# Patient Record
Sex: Male | Born: 1950 | Race: White | Hispanic: No | Marital: Married | Smoking: Never smoker
Health system: Southern US, Community
[De-identification: ages and names within clinical notes are randomized; demographics above are authoritative.]

## PROBLEM LIST (undated history)

## (undated) DIAGNOSIS — Z87442 Personal history of urinary calculi: Secondary | ICD-10-CM

## (undated) DIAGNOSIS — I420 Dilated cardiomyopathy: Secondary | ICD-10-CM

## (undated) DIAGNOSIS — K5730 Diverticulosis of large intestine without perforation or abscess without bleeding: Secondary | ICD-10-CM

## (undated) DIAGNOSIS — I10 Essential (primary) hypertension: Secondary | ICD-10-CM

## (undated) DIAGNOSIS — Z860101 Personal history of adenomatous and serrated colon polyps: Secondary | ICD-10-CM

## (undated) DIAGNOSIS — Z8659 Personal history of other mental and behavioral disorders: Secondary | ICD-10-CM

## (undated) DIAGNOSIS — M5030 Other cervical disc degeneration, unspecified cervical region: Secondary | ICD-10-CM

## (undated) DIAGNOSIS — G5602 Carpal tunnel syndrome, left upper limb: Secondary | ICD-10-CM

## (undated) DIAGNOSIS — D494 Neoplasm of unspecified behavior of bladder: Secondary | ICD-10-CM

## (undated) DIAGNOSIS — E785 Hyperlipidemia, unspecified: Secondary | ICD-10-CM

## (undated) DIAGNOSIS — R159 Full incontinence of feces: Secondary | ICD-10-CM

## (undated) DIAGNOSIS — J189 Pneumonia, unspecified organism: Secondary | ICD-10-CM

## (undated) DIAGNOSIS — K859 Acute pancreatitis without necrosis or infection, unspecified: Secondary | ICD-10-CM

## (undated) DIAGNOSIS — F419 Anxiety disorder, unspecified: Secondary | ICD-10-CM

## (undated) DIAGNOSIS — G4733 Obstructive sleep apnea (adult) (pediatric): Secondary | ICD-10-CM

## (undated) DIAGNOSIS — Z8551 Personal history of malignant neoplasm of bladder: Secondary | ICD-10-CM

## (undated) DIAGNOSIS — Z86010 Personal history of colonic polyps: Secondary | ICD-10-CM

## (undated) DIAGNOSIS — I428 Other cardiomyopathies: Secondary | ICD-10-CM

## (undated) DIAGNOSIS — I272 Pulmonary hypertension, unspecified: Secondary | ICD-10-CM

## (undated) DIAGNOSIS — I2510 Atherosclerotic heart disease of native coronary artery without angina pectoris: Secondary | ICD-10-CM

## (undated) DIAGNOSIS — E291 Testicular hypofunction: Secondary | ICD-10-CM

## (undated) DIAGNOSIS — J302 Other seasonal allergic rhinitis: Secondary | ICD-10-CM

## (undated) DIAGNOSIS — K219 Gastro-esophageal reflux disease without esophagitis: Secondary | ICD-10-CM

## (undated) DIAGNOSIS — K649 Unspecified hemorrhoids: Secondary | ICD-10-CM

## (undated) DIAGNOSIS — I517 Cardiomegaly: Secondary | ICD-10-CM

## (undated) DIAGNOSIS — Z9989 Dependence on other enabling machines and devices: Secondary | ICD-10-CM

## (undated) DIAGNOSIS — Z87448 Personal history of other diseases of urinary system: Secondary | ICD-10-CM

## (undated) DIAGNOSIS — E119 Type 2 diabetes mellitus without complications: Secondary | ICD-10-CM

## (undated) DIAGNOSIS — K449 Diaphragmatic hernia without obstruction or gangrene: Secondary | ICD-10-CM

## (undated) DIAGNOSIS — I519 Heart disease, unspecified: Secondary | ICD-10-CM

## (undated) HISTORY — PX: TRANSTHORACIC ECHOCARDIOGRAM: SHX275

## (undated) HISTORY — DX: Gastro-esophageal reflux disease without esophagitis: K21.9

## (undated) HISTORY — PX: COLONOSCOPY: SHX174

## (undated) HISTORY — PX: CARDIOVASCULAR STRESS TEST: SHX262

## (undated) HISTORY — DX: Hyperlipidemia, unspecified: E78.5

## (undated) HISTORY — DX: Acute pancreatitis without necrosis or infection, unspecified: K85.90

## (undated) HISTORY — DX: Other cervical disc degeneration, unspecified cervical region: M50.30

## (undated) HISTORY — DX: Type 2 diabetes mellitus without complications: E11.9

## (undated) HISTORY — PX: TONSILLECTOMY: SUR1361

## (undated) HISTORY — DX: Pneumonia, unspecified organism: J18.9

## (undated) HISTORY — DX: Anxiety disorder, unspecified: F41.9

## (undated) HISTORY — PX: VASECTOMY: SHX75

## (undated) HISTORY — DX: Unspecified hemorrhoids: K64.9

---

## 1984-11-22 HISTORY — PX: ORBITAL FRACTURE SURGERY: SHX725

## 1998-04-23 ENCOUNTER — Ambulatory Visit (HOSPITAL_COMMUNITY): Admission: RE | Admit: 1998-04-23 | Discharge: 1998-04-23 | Payer: Self-pay | Admitting: Internal Medicine

## 1998-04-23 ENCOUNTER — Encounter: Payer: Self-pay | Admitting: Internal Medicine

## 2000-10-26 ENCOUNTER — Other Ambulatory Visit: Admission: RE | Admit: 2000-10-26 | Discharge: 2000-10-26 | Payer: Self-pay | Admitting: Internal Medicine

## 2000-10-26 ENCOUNTER — Encounter (INDEPENDENT_AMBULATORY_CARE_PROVIDER_SITE_OTHER): Payer: Self-pay | Admitting: Specialist

## 2000-10-26 ENCOUNTER — Encounter: Payer: Self-pay | Admitting: Internal Medicine

## 2000-11-22 HISTORY — PX: VARICOSE VEIN SURGERY: SHX832

## 2004-08-06 ENCOUNTER — Encounter: Payer: Self-pay | Admitting: Internal Medicine

## 2004-11-18 ENCOUNTER — Ambulatory Visit: Payer: Self-pay | Admitting: Internal Medicine

## 2004-12-15 ENCOUNTER — Ambulatory Visit: Payer: Self-pay | Admitting: Internal Medicine

## 2005-02-15 ENCOUNTER — Ambulatory Visit: Payer: Self-pay | Admitting: Internal Medicine

## 2005-05-06 ENCOUNTER — Ambulatory Visit: Payer: Self-pay | Admitting: Internal Medicine

## 2005-10-13 ENCOUNTER — Ambulatory Visit: Payer: Self-pay | Admitting: Internal Medicine

## 2005-10-26 ENCOUNTER — Ambulatory Visit: Payer: Self-pay | Admitting: Internal Medicine

## 2005-11-10 ENCOUNTER — Ambulatory Visit: Payer: Self-pay | Admitting: Internal Medicine

## 2005-11-14 ENCOUNTER — Emergency Department (HOSPITAL_COMMUNITY): Admission: EM | Admit: 2005-11-14 | Discharge: 2005-11-14 | Payer: Self-pay | Admitting: Emergency Medicine

## 2005-11-26 ENCOUNTER — Encounter (INDEPENDENT_AMBULATORY_CARE_PROVIDER_SITE_OTHER): Payer: Self-pay | Admitting: *Deleted

## 2005-11-26 ENCOUNTER — Ambulatory Visit: Payer: Self-pay | Admitting: Internal Medicine

## 2005-11-26 ENCOUNTER — Encounter (INDEPENDENT_AMBULATORY_CARE_PROVIDER_SITE_OTHER): Payer: Self-pay | Admitting: Specialist

## 2005-12-01 ENCOUNTER — Ambulatory Visit: Payer: Self-pay | Admitting: Internal Medicine

## 2005-12-06 ENCOUNTER — Ambulatory Visit: Payer: Self-pay | Admitting: Internal Medicine

## 2006-03-07 ENCOUNTER — Ambulatory Visit: Payer: Self-pay | Admitting: Internal Medicine

## 2006-04-08 ENCOUNTER — Ambulatory Visit: Payer: Self-pay | Admitting: Internal Medicine

## 2006-05-10 ENCOUNTER — Ambulatory Visit: Payer: Self-pay | Admitting: Internal Medicine

## 2006-06-28 ENCOUNTER — Ambulatory Visit: Payer: Self-pay | Admitting: Internal Medicine

## 2006-07-18 ENCOUNTER — Ambulatory Visit: Payer: Self-pay | Admitting: Internal Medicine

## 2006-07-21 ENCOUNTER — Ambulatory Visit: Payer: Self-pay | Admitting: Internal Medicine

## 2006-10-09 ENCOUNTER — Encounter (INDEPENDENT_AMBULATORY_CARE_PROVIDER_SITE_OTHER): Payer: Self-pay | Admitting: *Deleted

## 2006-10-09 ENCOUNTER — Emergency Department (HOSPITAL_COMMUNITY): Admission: EM | Admit: 2006-10-09 | Discharge: 2006-10-09 | Payer: Self-pay | Admitting: Emergency Medicine

## 2006-10-10 ENCOUNTER — Ambulatory Visit: Payer: Self-pay | Admitting: Internal Medicine

## 2006-10-14 ENCOUNTER — Ambulatory Visit: Payer: Self-pay | Admitting: Internal Medicine

## 2006-10-14 ENCOUNTER — Encounter (INDEPENDENT_AMBULATORY_CARE_PROVIDER_SITE_OTHER): Payer: Self-pay | Admitting: *Deleted

## 2006-10-14 ENCOUNTER — Encounter (INDEPENDENT_AMBULATORY_CARE_PROVIDER_SITE_OTHER): Payer: Self-pay | Admitting: Specialist

## 2006-10-14 ENCOUNTER — Inpatient Hospital Stay (HOSPITAL_COMMUNITY): Admission: AD | Admit: 2006-10-14 | Discharge: 2006-10-16 | Payer: Self-pay | Admitting: Internal Medicine

## 2006-10-15 ENCOUNTER — Ambulatory Visit: Payer: Self-pay | Admitting: Internal Medicine

## 2006-10-15 ENCOUNTER — Encounter (INDEPENDENT_AMBULATORY_CARE_PROVIDER_SITE_OTHER): Payer: Self-pay | Admitting: *Deleted

## 2006-10-15 HISTORY — PX: LAPAROSCOPIC CHOLECYSTECTOMY: SUR755

## 2006-10-16 ENCOUNTER — Encounter (INDEPENDENT_AMBULATORY_CARE_PROVIDER_SITE_OTHER): Payer: Self-pay | Admitting: *Deleted

## 2006-10-17 ENCOUNTER — Encounter (INDEPENDENT_AMBULATORY_CARE_PROVIDER_SITE_OTHER): Payer: Self-pay | Admitting: *Deleted

## 2006-11-25 ENCOUNTER — Ambulatory Visit (HOSPITAL_BASED_OUTPATIENT_CLINIC_OR_DEPARTMENT_OTHER): Admission: RE | Admit: 2006-11-25 | Discharge: 2006-11-25 | Payer: Self-pay | Admitting: Urology

## 2006-11-25 HISTORY — PX: OTHER SURGICAL HISTORY: SHX169

## 2006-12-19 DIAGNOSIS — R7989 Other specified abnormal findings of blood chemistry: Secondary | ICD-10-CM | POA: Insufficient documentation

## 2007-04-28 ENCOUNTER — Ambulatory Visit: Payer: Self-pay | Admitting: Internal Medicine

## 2007-05-22 ENCOUNTER — Encounter: Payer: Self-pay | Admitting: Internal Medicine

## 2007-05-30 ENCOUNTER — Ambulatory Visit: Payer: Self-pay | Admitting: Internal Medicine

## 2007-05-30 DIAGNOSIS — K219 Gastro-esophageal reflux disease without esophagitis: Secondary | ICD-10-CM

## 2007-05-30 DIAGNOSIS — I10 Essential (primary) hypertension: Secondary | ICD-10-CM

## 2007-06-05 ENCOUNTER — Encounter (INDEPENDENT_AMBULATORY_CARE_PROVIDER_SITE_OTHER): Payer: Self-pay | Admitting: *Deleted

## 2007-06-16 ENCOUNTER — Telehealth (INDEPENDENT_AMBULATORY_CARE_PROVIDER_SITE_OTHER): Payer: Self-pay | Admitting: *Deleted

## 2007-06-19 ENCOUNTER — Encounter: Payer: Self-pay | Admitting: Internal Medicine

## 2007-08-06 ENCOUNTER — Encounter: Payer: Self-pay | Admitting: Internal Medicine

## 2007-08-19 ENCOUNTER — Encounter: Payer: Self-pay | Admitting: Internal Medicine

## 2007-08-25 ENCOUNTER — Ambulatory Visit: Payer: Self-pay | Admitting: Internal Medicine

## 2007-08-29 ENCOUNTER — Encounter: Payer: Self-pay | Admitting: Internal Medicine

## 2007-09-01 ENCOUNTER — Ambulatory Visit: Payer: Self-pay | Admitting: Internal Medicine

## 2007-09-01 LAB — CONVERTED CEMR LAB
BUN: 14 mg/dL (ref 6–23)
Creatinine, Ser: 1.2 mg/dL (ref 0.4–1.5)

## 2007-09-04 ENCOUNTER — Encounter (INDEPENDENT_AMBULATORY_CARE_PROVIDER_SITE_OTHER): Payer: Self-pay | Admitting: *Deleted

## 2007-09-13 ENCOUNTER — Encounter: Payer: Self-pay | Admitting: Internal Medicine

## 2007-10-10 ENCOUNTER — Ambulatory Visit: Payer: Self-pay | Admitting: Internal Medicine

## 2007-10-14 LAB — CONVERTED CEMR LAB
AST: 26 units/L (ref 0–37)
Bilirubin, Direct: 0.2 mg/dL (ref 0.0–0.3)
Cholesterol: 153 mg/dL (ref 0–200)
Direct LDL: 87.4 mg/dL
HDL: 42.5 mg/dL (ref >39.0)
Total Bilirubin: 1.3 mg/dL — ABNORMAL HIGH (ref 0.3–1.2)
Total Protein: 6.6 g/dL (ref 6.0–8.3)
Triglycerides: 211 mg/dL (ref 0–149)

## 2007-10-16 ENCOUNTER — Encounter (INDEPENDENT_AMBULATORY_CARE_PROVIDER_SITE_OTHER): Payer: Self-pay | Admitting: *Deleted

## 2007-10-17 ENCOUNTER — Encounter (INDEPENDENT_AMBULATORY_CARE_PROVIDER_SITE_OTHER): Payer: Self-pay | Admitting: *Deleted

## 2007-10-20 ENCOUNTER — Ambulatory Visit: Payer: Self-pay | Admitting: Internal Medicine

## 2007-10-20 DIAGNOSIS — E782 Mixed hyperlipidemia: Secondary | ICD-10-CM

## 2007-10-20 LAB — CONVERTED CEMR LAB: LDL Goal: 130 mg/dL

## 2007-12-18 ENCOUNTER — Telehealth (INDEPENDENT_AMBULATORY_CARE_PROVIDER_SITE_OTHER): Payer: Self-pay | Admitting: *Deleted

## 2007-12-18 ENCOUNTER — Ambulatory Visit: Payer: Self-pay | Admitting: Internal Medicine

## 2007-12-18 DIAGNOSIS — K449 Diaphragmatic hernia without obstruction or gangrene: Secondary | ICD-10-CM | POA: Insufficient documentation

## 2007-12-19 ENCOUNTER — Ambulatory Visit: Payer: Self-pay | Admitting: Internal Medicine

## 2007-12-20 ENCOUNTER — Ambulatory Visit: Payer: Self-pay | Admitting: Internal Medicine

## 2007-12-20 ENCOUNTER — Telehealth (INDEPENDENT_AMBULATORY_CARE_PROVIDER_SITE_OTHER): Payer: Self-pay | Admitting: *Deleted

## 2007-12-20 LAB — CONVERTED CEMR LAB
ALT: 90 units/L — ABNORMAL HIGH (ref 0–53)
AST: 286 units/L — ABNORMAL HIGH (ref 0–37)
AST: 217 units/L — ABNORMAL HIGH (ref 0–37)
Albumin: 3.9 g/dL (ref 3.5–5.2)
Alkaline Phosphatase: 32 units/L — ABNORMAL LOW (ref 39–117)
Basophils Relative: 0.4 % (ref 0.0–1.0)
Bilirubin, Direct: 0.1 mg/dL (ref 0.0–0.3)
Direct LDL: 125.3 mg/dL
Eosinophils Relative: 1.8 % (ref 0.0–5.0)
Hemoglobin: 14.8 g/dL (ref 13.0–17.0)
Lymphocytes Relative: 21.3 % (ref 12.0–46.0)
Monocytes Absolute: 0.7 10*3/uL (ref 0.2–0.7)
Neutro Abs: 4 10*3/uL (ref 1.4–7.7)
RDW: 11.3 % — ABNORMAL LOW (ref 11.5–14.6)
Total Bilirubin: 1.1 mg/dL (ref 0.3–1.2)
Total CHOL/HDL Ratio: 5.2
Total CK: 7859 units/L (ref 7–195)
Total CK: 3902 units/L — ABNORMAL HIGH (ref 7–232)
Total Protein: 6.2 g/dL (ref 6.0–8.3)
Total Protein: 6.7 g/dL (ref 6.0–8.3)
VLDL: 45 mg/dL — ABNORMAL HIGH (ref 0–40)
WBC: 6.1 10*3/uL (ref 4.5–10.5)

## 2007-12-21 ENCOUNTER — Encounter (INDEPENDENT_AMBULATORY_CARE_PROVIDER_SITE_OTHER): Payer: Self-pay | Admitting: *Deleted

## 2007-12-27 ENCOUNTER — Ambulatory Visit: Payer: Self-pay

## 2007-12-27 ENCOUNTER — Ambulatory Visit: Payer: Self-pay | Admitting: Internal Medicine

## 2008-01-03 ENCOUNTER — Encounter: Payer: Self-pay | Admitting: Internal Medicine

## 2008-01-19 ENCOUNTER — Encounter: Payer: Self-pay | Admitting: Internal Medicine

## 2008-04-06 DIAGNOSIS — K5730 Diverticulosis of large intestine without perforation or abscess without bleeding: Secondary | ICD-10-CM | POA: Insufficient documentation

## 2008-04-06 DIAGNOSIS — D126 Benign neoplasm of colon, unspecified: Secondary | ICD-10-CM

## 2008-09-05 ENCOUNTER — Encounter: Payer: Self-pay | Admitting: Internal Medicine

## 2008-09-12 ENCOUNTER — Encounter: Payer: Self-pay | Admitting: Internal Medicine

## 2008-09-18 ENCOUNTER — Encounter: Payer: Self-pay | Admitting: Internal Medicine

## 2008-12-02 ENCOUNTER — Telehealth (INDEPENDENT_AMBULATORY_CARE_PROVIDER_SITE_OTHER): Payer: Self-pay | Admitting: *Deleted

## 2008-12-05 ENCOUNTER — Encounter: Payer: Self-pay | Admitting: Internal Medicine

## 2009-01-01 ENCOUNTER — Ambulatory Visit: Payer: Self-pay | Admitting: Internal Medicine

## 2009-01-09 ENCOUNTER — Ambulatory Visit: Payer: Self-pay | Admitting: Internal Medicine

## 2009-04-22 ENCOUNTER — Ambulatory Visit: Payer: Self-pay | Admitting: Internal Medicine

## 2009-04-22 DIAGNOSIS — R109 Unspecified abdominal pain: Secondary | ICD-10-CM | POA: Insufficient documentation

## 2009-04-22 LAB — CONVERTED CEMR LAB
Basophils Relative: 0.4 % (ref 0.0–3.0)
Bilirubin Urine: NEGATIVE
Eosinophils Relative: 0.9 % (ref 0.0–5.0)
HCT: 44.9 % (ref 39.0–52.0)
Lymphs Abs: 1.9 10*3/uL (ref 0.7–4.0)
MCHC: 34 g/dL (ref 30.0–36.0)
MCV: 95.3 fL (ref 78.0–100.0)
Monocytes Absolute: 0.6 10*3/uL (ref 0.1–1.0)
Nitrite: NEGATIVE
Platelets: 220 10*3/uL (ref 150.0–400.0)
Protein, U semiquant: NEGATIVE
RBC: 4.71 M/uL (ref 4.22–5.81)
Urobilinogen, UA: 0.2
WBC: 6.8 10*3/uL (ref 4.5–10.5)

## 2009-04-23 ENCOUNTER — Encounter (INDEPENDENT_AMBULATORY_CARE_PROVIDER_SITE_OTHER): Payer: Self-pay | Admitting: *Deleted

## 2009-06-25 ENCOUNTER — Ambulatory Visit: Payer: Self-pay | Admitting: Internal Medicine

## 2009-06-25 DIAGNOSIS — R1032 Left lower quadrant pain: Secondary | ICD-10-CM

## 2009-09-24 ENCOUNTER — Telehealth (INDEPENDENT_AMBULATORY_CARE_PROVIDER_SITE_OTHER): Payer: Self-pay | Admitting: *Deleted

## 2009-09-26 ENCOUNTER — Encounter: Payer: Self-pay | Admitting: Internal Medicine

## 2009-10-08 ENCOUNTER — Ambulatory Visit: Payer: Self-pay | Admitting: Internal Medicine

## 2009-10-08 DIAGNOSIS — M79609 Pain in unspecified limb: Secondary | ICD-10-CM | POA: Insufficient documentation

## 2009-10-08 DIAGNOSIS — R209 Unspecified disturbances of skin sensation: Secondary | ICD-10-CM

## 2009-10-08 DIAGNOSIS — R9431 Abnormal electrocardiogram [ECG] [EKG]: Secondary | ICD-10-CM | POA: Insufficient documentation

## 2009-10-27 ENCOUNTER — Encounter: Payer: Self-pay | Admitting: Internal Medicine

## 2010-01-29 ENCOUNTER — Encounter: Payer: Self-pay | Admitting: Internal Medicine

## 2010-01-30 ENCOUNTER — Ambulatory Visit: Payer: Self-pay | Admitting: Internal Medicine

## 2010-02-03 LAB — CONVERTED CEMR LAB
ALT: 36 units/L (ref 0–53)
AST: 35 units/L (ref 0–37)
Alkaline Phosphatase: 47 units/L (ref 39–117)
BUN: 16 mg/dL (ref 6–23)
Bilirubin, Direct: 0.2 mg/dL (ref 0.0–0.3)
Calcium: 8.9 mg/dL (ref 8.4–10.5)
GFR calc non Af Amer: 72.83 mL/min (ref >60)
Glucose, Bld: 112 mg/dL — ABNORMAL HIGH (ref 70–99)
Total Bilirubin: 0.8 mg/dL (ref 0.3–1.2)

## 2010-03-12 ENCOUNTER — Ambulatory Visit: Payer: Self-pay | Admitting: Internal Medicine

## 2010-03-12 DIAGNOSIS — G5600 Carpal tunnel syndrome, unspecified upper limb: Secondary | ICD-10-CM

## 2010-03-12 DIAGNOSIS — M5412 Radiculopathy, cervical region: Secondary | ICD-10-CM | POA: Insufficient documentation

## 2010-04-06 ENCOUNTER — Encounter: Payer: Self-pay | Admitting: Internal Medicine

## 2010-07-10 ENCOUNTER — Encounter: Payer: Self-pay | Admitting: Internal Medicine

## 2010-07-20 ENCOUNTER — Telehealth (INDEPENDENT_AMBULATORY_CARE_PROVIDER_SITE_OTHER): Payer: Self-pay | Admitting: *Deleted

## 2010-10-21 ENCOUNTER — Encounter: Payer: Self-pay | Admitting: Internal Medicine

## 2010-11-20 ENCOUNTER — Encounter: Payer: Self-pay | Admitting: Internal Medicine

## 2010-11-22 DIAGNOSIS — J189 Pneumonia, unspecified organism: Secondary | ICD-10-CM

## 2010-11-22 HISTORY — DX: Pneumonia, unspecified organism: J18.9

## 2010-11-25 ENCOUNTER — Encounter (INDEPENDENT_AMBULATORY_CARE_PROVIDER_SITE_OTHER): Payer: Self-pay | Admitting: *Deleted

## 2010-12-02 ENCOUNTER — Telehealth (INDEPENDENT_AMBULATORY_CARE_PROVIDER_SITE_OTHER): Payer: Self-pay | Admitting: *Deleted

## 2010-12-03 ENCOUNTER — Ambulatory Visit
Admission: RE | Admit: 2010-12-03 | Discharge: 2010-12-03 | Payer: Self-pay | Source: Home / Self Care | Attending: Internal Medicine | Admitting: Internal Medicine

## 2010-12-04 ENCOUNTER — Encounter: Payer: Self-pay | Admitting: Internal Medicine

## 2010-12-04 ENCOUNTER — Ambulatory Visit
Admission: RE | Admit: 2010-12-04 | Discharge: 2010-12-04 | Payer: Self-pay | Source: Home / Self Care | Attending: Internal Medicine | Admitting: Internal Medicine

## 2010-12-04 ENCOUNTER — Telehealth (INDEPENDENT_AMBULATORY_CARE_PROVIDER_SITE_OTHER): Payer: Self-pay | Admitting: *Deleted

## 2010-12-11 ENCOUNTER — Telehealth: Payer: Self-pay | Admitting: Internal Medicine

## 2010-12-11 ENCOUNTER — Ambulatory Visit
Admission: RE | Admit: 2010-12-11 | Discharge: 2010-12-11 | Payer: Self-pay | Source: Home / Self Care | Attending: Internal Medicine | Admitting: Internal Medicine

## 2010-12-11 ENCOUNTER — Encounter: Payer: Self-pay | Admitting: Internal Medicine

## 2010-12-13 ENCOUNTER — Encounter: Payer: Self-pay | Admitting: Internal Medicine

## 2010-12-18 ENCOUNTER — Ambulatory Visit
Admission: RE | Admit: 2010-12-18 | Discharge: 2010-12-18 | Payer: Self-pay | Source: Home / Self Care | Attending: Internal Medicine | Admitting: Internal Medicine

## 2010-12-18 ENCOUNTER — Telehealth (INDEPENDENT_AMBULATORY_CARE_PROVIDER_SITE_OTHER): Payer: Self-pay | Admitting: *Deleted

## 2010-12-20 LAB — CONVERTED CEMR LAB
ALT: 31 units/L (ref 0–53)
ALT: 40 units/L (ref 0–53)
AST: 29 units/L (ref 0–37)
AST: 34 units/L (ref 0–37)
Alkaline Phosphatase: 49 units/L (ref 39–117)
BUN: 16 mg/dL (ref 6–23)
BUN: 17 mg/dL (ref 6–23)
Basophils Relative: 0.2 % (ref 0.0–3.0)
Bilirubin, Direct: 0.1 mg/dL (ref 0.0–0.3)
CO2: 30 meq/L (ref 19–32)
Calcium: 9.2 mg/dL (ref 8.4–10.5)
Calcium: 9.2 mg/dL (ref 8.4–10.5)
Chloride: 101 meq/L (ref 96–112)
Chloride: 106 meq/L (ref 96–112)
Creatinine, Ser: 1 mg/dL (ref 0.4–1.5)
Eosinophils Relative: 1.1 % (ref 0.0–5.0)
GFR calc non Af Amer: 67 mL/min
Glucose, Bld: 106 mg/dL — ABNORMAL HIGH (ref 70–99)
Glucose, Bld: 108 mg/dL — ABNORMAL HIGH (ref 70–99)
HDL goal, serum: 40 mg/dL
HDL: 32.3 mg/dL — ABNORMAL LOW (ref >39.0)
Hemoglobin: 15.2 g/dL (ref 13.0–17.0)
Hgb A1c MFr Bld: 5.6 % (ref 4.6–6.0)
LDL Goal: 160 mg/dL
Lymphocytes Relative: 24.9 % (ref 12.0–46.0)
Monocytes Relative: 9.6 % (ref 3.0–12.0)
Neutro Abs: 4 10*3/uL (ref 1.4–7.7)
Neutrophils Relative %: 64.2 % (ref 43.0–77.0)
RBC: 4.6 M/uL (ref 4.22–5.81)
Total Bilirubin: 1.3 mg/dL — ABNORMAL HIGH (ref 0.3–1.2)
Total CHOL/HDL Ratio: 4.4
Total CK: 185 units/L (ref 7–195)
Total Protein: 6.9 g/dL (ref 6.0–8.3)
VLDL: 105 mg/dL — ABNORMAL HIGH (ref 0–40)
WBC: 6.3 10*3/uL (ref 4.5–10.5)

## 2010-12-22 NOTE — Miscellaneous (Signed)
Summary: Orders Update   Clinical Lists Changes  Orders: Added new Test order of T-Cervical Spine Comp w/Flex & Ext (16109UE) - Signed

## 2010-12-22 NOTE — Progress Notes (Signed)
Summary: REFILL REQUEST  Phone Note Refill Request Call back at [phone] Message from:  Pharmacy on July 20, 2010 3:21 PM  Refills Requested: Medication #1:  LIPITOR 20 MG  TABS Take one tablet daily   Dosage confirmed as above?Dosage Confirmed   Supply Requested: 1 month   Last Refilled: 06/18/2010 [facility]  Next Appointment Scheduled: NONE Initial call taken by: Lavell Islam,  July 20, 2010 3:22 PM    Prescriptions: LIPITOR 20 MG  TABS (ATORVASTATIN CALCIUM) Take one tablet daily  #30 x 6   Entered by:   Shonna Chock CMA   Authorized by:   [name] MD   Signed by:   Shonna Chock CMA on 07/21/2010   Method used:   Electronically to        [facility]* (retail)       [address]       [place], [place]  [id number]       Ph: [phone] or [phone]       Fax: [phone]   RxID:   [id number]

## 2010-12-22 NOTE — Assessment & Plan Note (Signed)
Summary: hand pain/kdc   Vital Signs:  Patient profile:   60 year old male Weight:      270.6 pounds Temp:     98.6 degrees F oral Pulse rate:   80 / minute Resp:     15 per minute BP sitting:   110 / 70  (left arm) Cuff size:   large  Vitals Entered By: Shonna Chock (March 12, 2010 10:31 AM) CC: Hand pain Comments REVIEWED MED LIST, PATIENT AGREED DOSE AND INSTRUCTION CORRECT    Primary Care Provider:  Chrissie Noa Emmerie Battaglia,MD  CC:  Hand pain.  History of Present Illness: Intermittent dull- throbbimg pain in L 3rd & 4th fingers ,up to 6-7 level w/o trigger or injury, radiating to palm & lasting 30-60 min.It is recurring every 5 min - 60 min, worse @ night.No numbness & tingling; NSAIDS w/o benefit.Separate intermittent tingling in  4-6 inch area @ L posterior shoulder which may or may not be present with finger symptoms..  Allergies (verified): No Known Drug Allergies  Review of Systems General:  Denies chills, fever, and weight loss. CV:  Complains of swelling of feet and swelling of hands; denies chest pain or discomfort and palpitations; Some edema X 1 week. Resp:  Denies cough and shortness of breath. MS:  Denies joint pain. Derm:  Denies hair loss. Neuro:  Denies headaches, tremors, and weakness. Endo:  Complains of weight change; denies cold intolerance and heat intolerance; 20# gain over past year with Testosterone injections.  Physical Exam  General:  in no acute distress; alert,appropriate and cooperative throughout examination Eyes:  No corneal or conjunctival inflammation noted. EONo lid lag Neck:  No deformities, masses, or tenderness noted. Extremities:  No clubbing, cyanosis, edema. + Tinel's LUE Neurologic:  alert & oriented X3, strength normal in all extremities, and DTRs symmetrical and normal.   Skin:  Intact without suspicious lesions or rashes Cervical Nodes:  No lymphadenopathy noted Psych:  memory intact for recent and remote, normally interactive, and  good eye contact.     Impression & Recommendations:  Problem # 1:  CARPAL TUNNEL SYNDROME, LEFT (ICD-354.0)  Clinically present with + Tinel's  Orders: Venipuncture ([address]) Misc. Referral (Misc. Ref) TLB-TSH (Thyroid Stimulating Hormone) (84443-TSH)  Problem # 2:  CERVICAL RADICULOPATHY (ICD-723.4)  ? C8  with  weakness   Orders: Misc. Referral (Misc. Ref)  Complete Medication List: 1)  Lexapro 10 Mg Tabs (Escitalopram oxalate) .Marland Kitchen.. 1 by mouth qd 2)  Nexium 40 Mg Cpdr (Esomeprazole magnesium) .Marland Kitchen.. 1 by mouth qd 3)  Baby Asa 81mg   .... 1 by mouth once daily 4)  Protegra Multivitiam  ..Marland Kitchen. 1 by mouth once daily 5)  Lipitor 20 Mg Tabs (Atorvastatin calcium) .... Take one tablet daily 6)  Lisinopril 20 Mg Tabs (Lisinopril) .Marland Kitchen.. 1 by mouth once daily 7)  Zyrtec Hives Relief 10 Mg Tabs (Cetirizine hcl) .Marland Kitchen.. 1 by mouth once daily 8)  Testosterone Injection  .... 300mg  every two weeks -per urologist  Patient Instructions: 1)  Monitor for ergonomic( Ex . computer use) or repetitive hand motion triggers for symptoms. NCT /EMG will be scheduled as discussed.

## 2010-12-22 NOTE — Letter (Signed)
Summary: E Mail from Patient Regarding Med  E Mail from Patient Regarding Med   Imported By: Lanelle Bal 07/16/2010 12:25:59  _____________________________________________________________________  External Attachment:    Type:   Image     Comment:   External Document

## 2010-12-22 NOTE — Medication Information (Signed)
Summary: Prior [employer] Approved for Nexium/[employer]  Prior [employer] Approved for Nexium/[employer]   Imported By: Sherian Rein 10/26/2010 14:23:49  _____________________________________________________________________  External Attachment:    Type:   Image     Comment:   External Document

## 2010-12-23 ENCOUNTER — Encounter (INDEPENDENT_AMBULATORY_CARE_PROVIDER_SITE_OTHER): Payer: Self-pay | Admitting: *Deleted

## 2010-12-24 ENCOUNTER — Ambulatory Visit: Admit: 2010-12-24 | Payer: Self-pay | Admitting: Internal Medicine

## 2010-12-24 ENCOUNTER — Encounter: Payer: Self-pay | Admitting: Internal Medicine

## 2010-12-24 NOTE — Progress Notes (Signed)
Summary: Email input--Decongestant/sinus  Phone Note Other Incoming   Summary of Call: Patient and MD have been communicating via emial. Patient asked if there is remedy to open sinus/decongestant. Patient was advised via email to use neti-pot two times a day followed by generic Fluticason which will be sent to [facility]. Please do not take decongestants.  Initial call taken by: Lucious Groves CMA,  December 11, 2010 4:10 PM    New/Updated Medications: FLUTICASONE PROPIONATE 50 MCG/ACT SUSP (FLUTICASONE PROPIONATE) 1 spray each nostril two times a day Prescriptions: FLUTICASONE PROPIONATE 50 MCG/ACT SUSP (FLUTICASONE PROPIONATE) 1 spray each nostril two times a day  #1 x 3   Entered by:   Lucious Groves CMA   Authorized by:   [name] MD   Signed by:   Lucious Groves CMA on 12/11/2010   Method used:   Faxed to ...       [facility] (retail)       [address]       [place], [place]  [address]       Ph: [phone] or [phone]       Fax: [phone]   RxID:   [id number]

## 2010-12-24 NOTE — Progress Notes (Signed)
Summary: patient needs appointment  Phone Note Call from Patient   Caller: Patient Summary of Call: Per Dr Alwyn Ren, patient needs to be seen for a SDA or NDA visit for cold and cough that wont go away---[facility] on [phone] (W)  and [phone] to call us and schedule a 20 minute visit with Dr Alwyn Ren Initial call taken by: Jerolyn Shin,  December 02, 2010 1:54 PM  Follow-up for Phone Call        patient called back and scheduled appointment for Thursday 12/03/2010 at 3:30PM Follow-up by: Jerolyn Shin,  December 02, 2010 3:26 PM

## 2010-12-24 NOTE — Assessment & Plan Note (Signed)
Summary: cold, runny nose, pressure in ears, moving into bronchial tub...   Vital Signs:  Patient profile:   60 year old male Weight:      271.6 pounds BMI:     34.07 O2 Sat:      93 % on Room air Temp:     98.5 degrees F oral Pulse rate:   82 / minute Resp:     14 per minute BP sitting:   120 / 72  (left arm) Cuff size:   large  Vitals Entered By: Shonna Chock CMA (December 03, 2010 3:43 PM)  O2 Flow:  Room air CC: Cold, runny nose & pressure in ears. Symptoms onset 2 weeks before Christmas, patient rx'ed two antibiotics since and still with symptoms. Patient ?'s Bronchitis, URI symptoms   Primary Care Provider:  Chrissie Noa Gissel Keilman,MD  CC:  Cold, runny nose & pressure in ears. Symptoms onset 2 weeks before Christmas, patient rx'ed two antibiotics since and still with symptoms. Patient ?'s Bronchitis, and URI symptoms.  History of Present Illness:    Onset as head cold in mid December; no improvement with 7 days of Amox . Emycin Rxed 01/08 @ [facility]. Head congestion has resolved , but chest symptoms persist.  The patient now  reports variable  dry  to  productive cough, but denies nasal congestion, purulent nasal discharge, and earache.  Associated symptoms include wheezing.  The patient denies fever and dyspnea.  The patient denies headache.  The patient denies the following risk factors for Strep sinusitis: unilateral facial pain, tooth pain, and tender adenopathy.  No PMH of asthma.  Allergies (verified): No Known Drug Allergies  Review of Systems General:  Denies sweats; Chils intermittently.  Physical Exam  General:  well-nourished,in no acute distress; alert,appropriate and cooperative throughout examination Ears:  External ear exam shows no significant lesions or deformities.  Otoscopic examination reveals clear canals, tympanic membranes are intact bilaterally without bulging, retraction, inflammation or discharge. Hearing is grossly normal bilaterally. Nose:  External  nasal examination shows no deformity or inflammation. Nasal mucosa are pink and moist without lesions or exudates. Mouth:  Oral mucosa and oropharynx without lesions or exudates.  Teeth in good repair. Moderate pharyngeal erythema.   Lungs:  Normal respiratory effort, chest expands symmetrically. Lungs ; persistent L posterior   mid field  rales/crackles w/o  wheezes. Racking cough Heart:  Normal rate and regular rhythm. S1 and S2 normal without gallop, murmur, click, rub or other extra sounds. Extremities:  No clubbing, cyanosis, edema. Cervical Nodes:  No lymphadenopathy noted Axillary Nodes:  No palpable lymphadenopathy   Impression & Recommendations:  Problem # 1:  BRONCHITIS-ACUTE (ICD-466.0)  L "walking Pneumonia " suggested  clinically  Orders: T-2 View CXR (71020TC)  His updated medication list for this problem includes:    Proventil Hfa 108 (90 Base) Mcg/act Aers (Albuterol sulfate) .Marland Kitchen... 1-2 puffs every 4 hrs as needed    Symbicort 160-4.5 Mcg/act Aero (Budesonide-formoterol fumarate) .Marland Kitchen... 1-2 puffs every 12 hrs ; gargle & spit after use    Avelox Abc Pack 400 Mg Tabs (Moxifloxacin hcl) .Marland Kitchen... 1 once daily after 2 days of samples  Complete Medication List: 1)  Lexapro 10 Mg Tabs (Escitalopram oxalate) .Marland Kitchen.. 1 by mouth qd 2)  Nexium 40 Mg Cpdr (Esomeprazole magnesium) .Marland Kitchen.. 1 by mouth qd 3)  Baby Asa 81mg   .... 1 by mouth once daily 4)  Protegra Multivitiam  ..Marland Kitchen. 1 by mouth once daily 5)  Lipitor 20 Mg Tabs (Atorvastatin  calcium) .... Take one tablet daily 6)  Lisinopril 20 Mg Tabs (Lisinopril) .Marland Kitchen.. 1 by mouth once daily 7)  Zyrtec Hives Relief 10 Mg Tabs (Cetirizine hcl) .Marland Kitchen.. 1 by mouth once daily 8)  Testosterone Injection  .... 200mg  every two weeks -per urologist 9)  Proventil Hfa 108 (90 Base) Mcg/act Aers (Albuterol sulfate) .Marland Kitchen.. 1-2 puffs every 4 hrs as needed 10)  Symbicort 160-4.5 Mcg/act Aero (Budesonide-formoterol fumarate) .Marland Kitchen.. 1-2 puffs every 12 hrs ; gargle &  spit after use 11)  Avelox Abc Pack 400 Mg Tabs (Moxifloxacin hcl) .Marland Kitchen.. 1 once daily after 2 days of samples 12)  Prednisone 20 Mg Tabs (Prednisone) .Marland Kitchen.. 1 two times a day with meals  Patient Instructions: 1)  Drink as much fluid as you can tolerate for the next few days. CXray @ [facility]. Prescriptions: PREDNISONE 20 MG TABS (PREDNISONE) 1 two times a day with meals  #10 x 0   Entered and Authorized by:   [name] MD   Signed by:   [name] MD on 12/03/2010   Method used:   Print then Give to Patient   RxID:   [id number] AVELOX ABC PACK 400 MG TABS (MOXIFLOXACIN HCL) 1 once daily after 2 days of samples  #5 x 0   Entered and Authorized by:   [name] MD   Signed by:   [name] MD on 12/03/2010   Method used:   Samples Given   RxID:   [id number] SYMBICORT 160-4.5 MCG/ACT AERO (BUDESONIDE-FORMOTEROL FUMARATE) 1-2 puffs every 12 hrs ; gargle & spit after use  #1 x 0   Entered and Authorized by:   [name] MD   Signed by:   [name] MD on 12/03/2010   Method used:   Samples Given   RxID:   [id number] PROVENTIL HFA 108 (90 BASE) MCG/ACT AERS (ALBUTEROL SULFATE) 1-2 puffs every 4 hrs as needed  #1 x 2   Entered and Authorized by:   [name] MD   Signed by:   [name] MD on 12/03/2010   Method used:   Samples Given   RxID:   [id number]    Orders Added: 1)  Est. Patient Level III [42595] 2)  T-2 View CXR [71020TC]

## 2010-12-24 NOTE — Letter (Signed)
Summary: Colonoscopy Letter  [facility]  [address] [place], [place] [address]   Phone: [phone]  Fax: [phone]      November 20, 2010 MRN: [id number]   JAYLEE LANTRY [address] [place], [place]  [address]   Dear Mr. Olvera,   According to your medical record, it is time for you to schedule a Colonoscopy. The American Cancer Society recommends this procedure as a method to detect early colon cancer. Patients with a family history of colon cancer, or a personal history of colon polyps or inflammatory bowel disease are at increased risk.  This letter has been generated based on the recommendations made at the time of your procedure. If you feel that in your particular situation this may no longer apply, please contact our office.  Please call our office at [phone] to schedule this appointment or to update your records at your earliest convenience.  Thank you for cooperating with [place] to provide you with the very best care possible.   Sincerely,  Wilhemina Bonito. Marina Goodell, M.D.  [facility] Gastroenterology Division [phone]

## 2010-12-24 NOTE — Progress Notes (Signed)
Summary: Xray Results  Phone Note Outgoing Call Call back at Home Phone [phone]   Call placed by: Shonna Chock CMA,  December 04, 2010 4:37 PM Call placed to: Patient Details for Reason: Chest Xray Rsults Summary of Call: Spoke with patient, per Dr.Hopper: Pneumonia on left as expected, stay on avelox x 7days and recheck xray  **Order for xray and copy of xray results mailed**  Shonna Chock CMA  December 04, 2010 4:38 PM

## 2010-12-24 NOTE — Letter (Signed)
Summary: Pre Visit Letter Revised  [facility]  [address] [place], [place] [address]   Phone: [phone]  Fax: [phone]        11/25/2010 MRN: [id number] Brian Cortez [address] [place], [place]  [address]          Procedure Date:  01/07/2011 @ 9:00am   Recall colon-Dr. Marina Goodell   Welcome to the Gastroenterology Division at [facility]    You are scheduled to see a nurse for your pre-procedure visit on 12/24/2010 at 11:00am on the 3rd floor at [facility], [address]  We ask that you try to arrive at our office 15 minutes prior to your appointment time to allow for check-in.  Please take a minute to review the attached form.  If you answer "Yes" to one or more of the questions on the first page, we ask that you call the person listed at your earliest opportunity.  If you answer "No" to all of the questions, please complete the rest of the form and bring it to your appointment.    Your nurse visit will consist of discussing your medical and surgical history, your immediate family medical history, and your medications.   If you are unable to list all of your medications on the form, please bring the medication bottles to your appointment and we will list them.  We will need to be aware of both prescribed and over the counter drugs.  We will need to know exact dosage information as well.    Please be prepared to read and sign documents such as consent forms, a financial agreement, and acknowledgement forms.  If necessary, and with your consent, a friend or relative is welcome to sit-in on the nurse visit with you.  Please bring your insurance card so that we may make a copy of it.  If your insurance requires a referral to see a specialist, please bring your referral form from your primary care physician.  No co-pay is required for this nurse visit.     If you cannot keep your appointment, please call [phone] to cancel or reschedule prior to  your appointment date.  This allows [place] the opportunity to schedule an appointment for another patient in need of care.    Thank you for choosing [facility] for your medical needs.  We appreciate the opportunity to care for you.  Please visit [place] at our website  to learn more about our practice.  Sincerely, The Gastroenterology Division

## 2010-12-24 NOTE — Progress Notes (Signed)
Summary: Chest XRay order Request  Phone Note From Other Clinic   Caller: [facility] Summary of Call: Needs order for recheck on Chest Xray, patient in office now.   Initial call taken by: Shonna Chock CMA,  December 18, 2010 4:15 PM

## 2010-12-30 NOTE — Letter (Signed)
Summary: E Mail from Patient with Concerns  E Mail from Patient with Concerns   Imported By: Lanelle Bal 12/23/2010 12:44:44  _____________________________________________________________________  External Attachment:    Type:   Image     Comment:   External Document

## 2010-12-30 NOTE — Letter (Signed)
Summary: [facility]  [facility]   Imported By: Lanelle Bal 12/21/2010 08:56:48  _____________________________________________________________________  External Attachment:    Type:   Image     Comment:   External Document

## 2010-12-30 NOTE — Miscellaneous (Signed)
Summary: [facility] previsit  Clinical Lists Changes  Medications: Added new medication of MOVIPREP 100 GM  SOLR (PEG-KCL-NACL-NASULF-NA ASC-C) As per prep instructions. - Signed Rx of MOVIPREP 100 GM  SOLR (PEG-KCL-NACL-NASULF-NA ASC-C) As per prep instructions.;  #1 x 0;  Signed;  Entered by: Karl Bales RN;  Authorized by: Hilarie Fredrickson MD;  Method used: Electronically to [facility] [address], [place], [place]  [id number], Ph: [phone] or [phone], Fax: [phone] Observations: Added new observation of NKA: T (12/24/2010 11:34)    Prescriptions: MOVIPREP 100 GM  SOLR (PEG-KCL-NACL-NASULF-NA ASC-C) As per prep instructions.  #1 x 0   Entered by:   Karl Bales RN   Authorized by:   Hilarie Fredrickson MD   Signed by:   Karl Bales RN on 12/24/2010   Method used:   Electronically to        [facility] (retail)       [address]       [place], [place]  [id number]       Ph: [phone] or [phone]       Fax: [phone]   RxID:   [id number]

## 2010-12-30 NOTE — Letter (Signed)
Summary: [facility] Instructions  [facility]  [address] [place], [place] [address]   Phone: [phone]  Fax: [phone]       Brian Cortez    09-08-1951    MRN: [id number]        Procedure Day /Date:  Thursday 01/07/2011     Arrival Time: 8:00 am     Procedure Time: 9:00 am     Location of Procedure:                    _x _  [facility] (4th Floor)                        PREPARATION FOR COLONOSCOPY WITH MOVIPREP   Starting 5 days prior to your procedure Saturday 2/11 do not eat nuts, seeds, popcorn, corn, beans, peas,  salads, or any raw vegetables.  Do not take any fiber supplements (e.g. Metamucil, Citrucel, and Benefiber).  THE DAY BEFORE YOUR PROCEDURE         DATE: Wednesday 2/15  1.  Drink clear liquids the entire day-NO SOLID FOOD  2.  Do not drink anything colored red or purple.  Avoid juices with pulp.  No orange juice.  3.  Drink at least 64 oz. (8 glasses) of fluid/clear liquids during the day to prevent dehydration and help the prep work efficiently.  CLEAR LIQUIDS INCLUDE: Water Jello Ice Popsicles Tea (sugar ok, no milk/cream) Powdered fruit flavored drinks Coffee (sugar ok, no milk/cream) Gatorade Juice: apple, white grape, white cranberry  Lemonade Clear bullion, consomm, broth Carbonated beverages (any kind) Strained chicken noodle soup Hard Candy                             4.  In the morning, mix first dose of MoviPrep solution:    Empty 1 Pouch A and 1 Pouch B into the disposable container    Add lukewarm drinking water to the top line of the container. Mix to dissolve    Refrigerate (mixed solution should be used within 24 hrs)  5.  Begin drinking the prep at 5:00 p.m. The MoviPrep container is divided by 4 marks.   Every 15 minutes drink the solution down to the next mark (approximately 8 oz) until the full liter is complete.   6.  Follow completed prep with 16 oz of clear liquid of your choice  (Nothing red or purple).  Continue to drink clear liquids until bedtime.  7.  Before going to bed, mix second dose of MoviPrep solution:    Empty 1 Pouch A and 1 Pouch B into the disposable container    Add lukewarm drinking water to the top line of the container. Mix to dissolve    Refrigerate  THE DAY OF YOUR PROCEDURE      DATE: Thursday 2/16  Beginning at 4:00 a.m. (5 hours before procedure):         1. Every 15 minutes, drink the solution down to the next mark (approx 8 oz) until the full liter is complete.  2. Follow completed prep with 16 oz. of clear liquid of your choice.    3. You may drink clear liquids until 7:00 am (2 HOURS BEFORE PROCEDURE).   MEDICATION INSTRUCTIONS  Unless otherwise instructed, you should take regular prescription medications with a small sip of water   as early as possible the morning of your  procedure.         OTHER INSTRUCTIONS  You will need a responsible adult at least 60 years of age to accompany you and drive you home.   This person must remain in the waiting room during your procedure.  Wear loose fitting clothing that is easily removed.  Leave jewelry and other valuables at home.  However, you may wish to bring a book to read or  an iPod/MP3 player to listen to music as you wait for your procedure to start.  Remove all body piercing jewelry and leave at home.  Total time from sign-in until discharge is approximately 2-3 hours.  You should go home directly after your procedure and rest.  You can resume normal activities the  day after your procedure.  The day of your procedure you should not:   Drive   Make legal decisions   Operate machinery   Drink alcohol   Return to work  You will receive specific instructions about eating, activities and medications before you leave.    The above instructions have been reviewed and explained to me by   Karl Bales RN  December 24, 2010 11:35 AM   I fully understand  and can verbalize these instructions _____________________________ Date _________

## 2011-01-07 ENCOUNTER — Other Ambulatory Visit (AMBULATORY_SURGERY_CENTER): Payer: 59 | Admitting: Internal Medicine

## 2011-01-07 ENCOUNTER — Other Ambulatory Visit: Payer: Self-pay | Admitting: Internal Medicine

## 2011-01-07 DIAGNOSIS — Z1211 Encounter for screening for malignant neoplasm of colon: Secondary | ICD-10-CM

## 2011-01-07 DIAGNOSIS — K620 Anal polyp: Secondary | ICD-10-CM

## 2011-01-07 DIAGNOSIS — K5730 Diverticulosis of large intestine without perforation or abscess without bleeding: Secondary | ICD-10-CM

## 2011-01-07 DIAGNOSIS — D129 Benign neoplasm of anus and anal canal: Secondary | ICD-10-CM

## 2011-01-07 DIAGNOSIS — D128 Benign neoplasm of rectum: Secondary | ICD-10-CM

## 2011-01-07 DIAGNOSIS — D126 Benign neoplasm of colon, unspecified: Secondary | ICD-10-CM

## 2011-01-07 DIAGNOSIS — Z86010 Personal history of colonic polyps: Secondary | ICD-10-CM

## 2011-01-07 LAB — HM COLONOSCOPY

## 2011-01-12 ENCOUNTER — Encounter: Payer: Self-pay | Admitting: Internal Medicine

## 2011-01-13 NOTE — Procedures (Addendum)
Summary: Colonoscopy  Patient: Brian Cortez Note: All result statuses are Final unless otherwise noted.  Tests: (1) Colonoscopy (COL)   COL Colonoscopy           DONE     [facility]     [address]     [place], [place]  [address]           COLONOSCOPY PROCEDURE REPORT           PATIENT:  [name], [name]  MR#:  [id number]     BIRTHDATE:  08/22/1951, 59 yrs. old  GENDER:  male     ENDOSCOPIST:  Wilhemina Bonito. Eda Keys, MD     REF. BY:  Surveillance Program Recall,     PROCEDURE DATE:  01/07/2011     PROCEDURE:  Colonoscopy with snare polypectomy x 2     ASA CLASS:  Class II     INDICATIONS:  history of pre-cancerous (adenomatous) colon polyps,     surveillance and high-risk screening ; index 2001, f/u 2007 w/ TAs           MEDICATIONS:   Fentanyl 75 mcg IV, Versed 8 mg IV           DESCRIPTION OF PROCEDURE:   After the risks benefits and     alternatives of the procedure were thoroughly explained, informed     consent was obtained.  Digital rectal exam was performed and     revealed no abnormalities.   The LB 180AL [id number] endoscope was     introduced through the anus and advanced to the cecum, which was     identified by both the appendix and ileocecal valve, without     limitations.Time to cecum = 2:27 min.  The quality of the prep was     excellent, using MoviPrep.  The instrument was then slowly     withdrawn (time = 11:13 min) as the colon was fully examined.     <<PROCEDUREIMAGES>>           FINDINGS:  Two 2mm polyps were found in the rectum and sigmoid     colon respectively. Polyps were snared without cautery. Retrieval     was successful.   Mild diverticulosis was found in the sigmoid     colon.  Otherwise normal colonoscopy without other polyps, masses,     vascular ectasias, or inflammatory changes.   Retroflexed views in     the rectum revealed internal hemorrhoids.    The scope was then     withdrawn from the patient and the procedure completed.          COMPLICATIONS:  None     ENDOSCOPIC IMPRESSION:     1) Two polyps in the rectum and sigmoid colon - removed     2) Mild diverticulosis in the sigmoid colon     3) Otherwise nl colonoscopy     4) Small Internal hemorrhoids           RECOMMENDATIONS:     1) Follow up colonoscopy in 5 years           ______________________________     Wilhemina Bonito. Eda Keys, MD           CC:  Pecola Lawless, MD; The Patient           n.     eSIGNED:   Wilhemina Bonito. Eda Keys at 01/07/2011 09:37 AM  [name], [name], [id number]  Note: An exclamation mark (!) indicates a result that was not dispersed into the flowsheet. Document Creation Date: 01/07/2011 9:38 AM _______________________________________________________________________  (1) Order result status: Final Collection or observation date-time: 01/07/2011 09:31 Requested date-time:  Receipt date-time:  Reported date-time:  Referring Physician:   Ordering Physician: Fransico Setters [phone]) Specimen Source:  Source: Launa Grill Order Number: [id number] Lab site:   Appended Document: Colonoscopy recall 5 yrs     Procedures Next Due Date:    Colonoscopy: 12/2015

## 2011-01-19 NOTE — Letter (Signed)
Summary: Patient Notice- Polyp Results   Gastroenterology  [address] [place], [place] [address]   Phone: [phone]  Fax: [phone]        January 12, 2011 MRN: [id number]    Brian Cortez [address] [place], [place]  [address]    Dear [name],  I am pleased to inform you that the colon polyp(s) removed during your recent colonoscopy was (were) found to be benign (no cancer detected) upon pathologic examination.  I recommend you have a repeat colonoscopy examination in 5 years to look for recurrent polyps, as having colon polyps increases your risk for having recurrent polyps or even colon cancer in the future.  Should you develop new or worsening symptoms of abdominal pain, bowel habit changes or bleeding from the rectum or bowels, please schedule an evaluation with either your primary care physician or with me.    Additional information/recommendations:  __ No further action with gastroenterology is needed at this time. Please      follow-up with your primary care physician for your other healthcare      needs.  Please call us if you are having persistent problems or have questions about your condition that have not been fully answered at this time.  Sincerely,  Hilarie Fredrickson MD  This letter has been electronically signed by your physician.  Appended Document: Patient Notice- Polyp Results letter mailed

## 2011-04-06 NOTE — Assessment & Plan Note (Signed)
[facility]                         GASTROENTEROLOGY OFFICE NOTE   NAME:Brian Cortez, Brian Cortez                    MRN:          [id number]  DATE:04/28/2007                            DOB:          1951/03/22    HISTORY:  Nadine Counts presents today for followup. He is followed in this office  for reflux disease and colon polyps. He was last seen in January when he  underwent surveillance colonoscopy. He was found to have one diminutive  polyp which was removed and found to be adenomatous. Followup in 5 years  recommended. At that time, he was complaining of epigastric and chest  pain. Abdominal ultrasound was negative for gallstones. He continued to  have intermittent problems with pain and eventually was found to have  cholecystitis for which he underwent cholecystectomy in November 2007.  He has done well since without recurrent pain. He continues on Nexium  for his reflux disease. On Nexium no indigestion, heartburn or  dysphagia. No medication side effects. No low abdominal complaints.   CURRENT MEDICATIONS:  Lexapro, Nexium, Vytorin, aspirin and Xyzal.   PHYSICAL EXAMINATION:  GENERAL:  Well-appearing male in no acute  distress.  VITAL SIGNS:  Blood pressure is 110/68, heart rate is 64, weight is  244.4 pounds.  ABDOMEN:  Soft without tenderness, mass or hernia, good bowel sounds  heard.   IMPRESSION:  1. Gastroesophageal reflux disease. Stable on Nexium.  2. History of adenomatous colon polyps. Due for followup in 2012.  3. History of symptomatic gallbladder disease status post      cholecystectomy.   RECOMMENDATIONS:  1. Continue Nexium. A prescription with multiple refills has been      provided.  2. GI followup in one year. Otherwise resume general medical care with      Dr. Alwyn Ren.     Wilhemina Bonito. Marina Goodell, MD  Electronically Signed    JNP/MedQ  DD: 04/28/2007  DT: 04/28/2007  Job #: [id number]

## 2011-04-09 NOTE — Op Note (Signed)
Brian Cortez, Brian Cortez             ACCOUNT NO.:  [id number]   MEDICAL RECORD NO.:  [id number]          PATIENT TYPE:  AMB   LOCATION:  [facility]                         FACILITY:  [facility]   PHYSICIAN:  Boston Service, M.D.DATE OF BIRTH:  [birth year]   DATE OF PROCEDURE:  11/25/2006  DATE OF DISCHARGE:                               OPERATIVE REPORT   PREOP DIAGNOSIS:  Proximal urethral stricture.   POSTOP DIAGNOSIS:  Proximal urethral stricture.   PROCEDURE:  1. Cystoscopy.  2. Direct vision internal ureterotomy.   SURGEON:  Boston Service, M.D.   ASSISTANT:  None.   ANESTHESIA:  General.   SPECIMENS:  None   ESTIMATED BLOOD LOSS:  Minimal.   DRAINS:  A 22-French silicone catheter.   DESCRIPTION OF PROCEDURE:  The patient was prepped and draped in the  dorsal lithotomy position after institution of an adequate level of  general anesthesia.  A well lubricated 22-French urethrotome was then  inserted at the urethral meatus.  Adequate photographs were taken of the  distal urethra which appeared normal caliber, proximal urethra showed a  significant narrowing, a 3-French ureteral catheter was then passed  through the central portion of the narrowing; appeared to pass easily  into the bladder.  A urethrotome was then used to gently incise the  stricture at the 12 o'clock position.  Incision carried out through some  filmy white tissue, down into pink healthy tissue.  The scope then  advanced easily into the bladder; trabeculated with a large capacity; it  drained easily.  The  urethrotome was then withdrawn, and a 22-French silicone catheter passed  easily into the bladder.  The stricture appeared to be about 1 cm distal  to the membranous urethra.  Catheter was left to straight drain.  The  patient was returned to recovery in satisfactory condition.           ______________________________  Boston Service, M.D.     RH/MEDQ  D:  11/25/2006  T:  11/25/2006  Job:   [id number]   cc:   Titus Dubin. Alwyn Ren, MD,FACP,FCCP  [phone] [place]  [place] [address]   Cherylynn Ridges, M.D.  [address], [address]  [place]  [place] [address]

## 2011-04-09 NOTE — Discharge Summary (Signed)
Brian Cortez, Brian Cortez             ACCOUNT NO.:  [id number]   MEDICAL RECORD NO.:  [id number]          PATIENT TYPE:  INP   LOCATION:  3712                         FACILITY:  [facility]   PHYSICIAN:  Valetta Mole. Swords, MD    DATE OF BIRTH:  1951-07-24   DATE OF ADMISSION:  10/14/2006  DATE OF DISCHARGE:  10/16/2006                               DISCHARGE SUMMARY   DISCHARGE DIAGNOSES:  1. Acute cholecystitis.  2. Status post cholecystectomy.   OTHER MEDICAL HISTORY:  In problem list per history and physical.   CONDITION ON DISCHARGE:  Improved.  Tolerating diet.   FOLLOWUP PLAN:  Dr. Lindie Spruce in 1-2 weeks.   DISCHARGE MEDICATIONS:  Vicodin ES p.r.n. in addition to home  medications (see admission history and physical).   HOSPITAL PROCEDURES:  1. Cholecystectomy performed October 15, 2006.  2. Ultrasound of the abdomen demonstrated thickened gallbladder wall      __________ cholecystitis.  3. CT of the abdomen/pelvis demonstrated a acute cholecystitis and      also noted colonic diverticulosis.   HOSPITAL COURSE:  The patient admitted to the hospitalist service on  October 14, 2006 by Dr. Alwyn Ren.  The patient with abdominal pain and  history of fever.  The patient seen in consultation by general surgery.  The patient underwent cholecystectomy on October 15, 2006.  This was  done laparoscopically.  The patient tolerated the procedure well.  At  the time of discharge was tolerating a diet without difficulty and  ambulating in the halls without difficulty.  The patient will follow up  with Dr. Lindie Spruce as scheduled.      Bruce Rexene Edison Swords, MD  Electronically Signed     BHS/MEDQ  D:  10/16/2006  T:  10/16/2006  Job:  [id number]

## 2011-04-09 NOTE — H&P (Signed)
Brian, Cortez             ACCOUNT NO.:  [id number]   MEDICAL RECORD NO.:  [id number]          PATIENT TYPE:  INP   LOCATION:  3712                         FACILITY:  [facility]   PHYSICIAN:  Titus Dubin. Hopper, MD,FACP,FCCPDATE OF BIRTH:  Mar 28, 1951   DATE OF ADMISSION:  10/14/2006  DATE OF DISCHARGE:                              HISTORY & PHYSICAL   REASON FOR HOSPITALIZATION:  Brian Cortez is a 60 year old, white  male admitted with abdominal pain and fever.   HISTORY OF PRESENT ILLNESS:  He was seen in the emergency room early in  the morning of November 18.  He described right-sided abdominal pain  with movement.  It was dull to sharp pain.  He also had pain in the  epigastrium, which radiated bilaterally.  CT scan of the abdomen  revealed no acute abdominal process.  Adrenal glands, kidneys, spleen,  liver, portal veins and gallbladder were within normal limits.  There  was minimal distention of the gallbladder without evidence of wall  thickening.  There was no lymphadenopathy or ascites.  He did have a few  diverticula along the left colon without acute inflammatory change.  A  CT of the pelvis revealed bladder wall thickening with very large  diverticulum.  It was suggested that he might have underlying bladder  outlet obstruction.   Apparently he was unable to urinate in the emergency room and  catheterization could not be accomplished.   He was given Percocet and followup was scheduled with Dr. Boston Service.   He was seen in my office on November 19, stating that he had been able  to urinate as of the evening of November 18.  He had experienced renal  calculi in 1972 and felt that this was somewhat reminiscent of that  presentation.   On exam he was tender over the abdomen, mainly in the right lower  quadrant.  Urinalysis revealed a large amount of blood.  A screening  device was provided to collect any calculi.  He was placed on Flomax  0.4.  He was  instructed to keep the appointment with the urologist.   He returned on November 23.  He stated that the pain had intensified as  of November 21.  It was  up to an 8 on a 10 scale.  It was described as  a dull throb across the midsection.  He had had intermittent fever since  being seen in the emergency room.  He had not taken any antipyretics; he  thought the emergency room instructed him not to take Tylenol or other  such agents with the Percocet.  He had experienced some fever and  chills.  He also had had nausea without vomiting.  He had described  flatulence and bloating.  He denied any melena.  He also denied any  flank pain; all abdominal symptoms were in the anterior abdomen.   He had a temperature of 100.4.  He appeared profoundly fatigued.  Again,  he was diffusely tender, but mainly in the left lower quadrant.  Urinalysis again revealed a large amount of blood.   Because of the  progressive abdominal pain, fever, and hematuria, he was  admitted to [facility].  I expressed that the hematuria might be a red  herring.  With the localization to the lower quadrants and documented  diverticulosis at colonoscopy in January 2007, I questioned whether he  may have diverticulitis.   PAST MEDICAL HISTORY:  His past medical history reveals a blowout  fracture playing basketball in 1987.  He has had tonsillectomy and  vasectomy.  He was treated as an outpatient in 1996 for pneumonia.  Colonoscopy was negative in 2002.  In January 2007, the colonoscopy  revealed colon polyps and diverticulosis.  He had gone to the emergency  room Christmas Eve 2006 with abdominal pain, but he was never seen.  He  left after several hours of waiting.  He was seen in the emergency room  in June of 2007 for abdominal pain as well.   Past medical history also includes dyslipidemia with cholesterols as  high as 328 and triglycerides as high as 621.  He has also had  hyperglycemia but normal hemoglobin A1c.  He has  had elevated liver  enzymes in the past.   FAMILY HISTORY:  Includes dysrhythmia in his mother who died in her mid-  31s.  Father died at 35 with myocardial infarction and stroke.  He was a  smoker.  Paternal grandfather died of myocardial infarction in his 58s  and also had had a stroke.  He also was a smoker.  Paternal uncle had  prostate cancer.  Four sisters have had dysrhythmias and there has been  open heart surgery among the females in his family.   SOCIAL HISTORY:  He has never smoked.  He drinks socially.  He had  exercised on a regular basis 3-4 times per week, 4.2 miles in less than  one hour with no cardiopulmonary symptoms.  He had been seen by  Durwin Nora, nutritionist and was on a low carb diet.   MEDICATIONS:  Include:  Vytorin 10/20, saw palmetto extract, enteric-  coated aspirin, Lexapro 10 mg, Nexium 40 mg daily, AndroGel testosterone  supplement from Dr. Wanda Plump and the Flomax recently prescribed.   Lipitor had caused elevated CPKs.   REVIEW OF SYSTEMS:  Reveals no otolaryngologic or upper respiratory  tract symptoms.  He has had no cardiopulmonary symptoms.  Other than the  oliguria, which is resolved, he has no genitourinary symptoms.  He has  no arthritic symptoms at this time.  There are no paresthesias.  No  active neuropsychiatric issues are present.  He was evaluated by Dr.  Yancey Flemings for esophageal reflux and colon polyps.  The polyps had been  adenomatous on biopsy and followup was recommended in January 2012.  The  diverticulosis was felt to be an incidental finding.  He was evaluated  in January 2007 following his ER visit.  An ultrasound at that time  revealed normal liver, gallbladder, pancreas and bile ducts.   PHYSICAL EXAMINATION:  GENERAL:  On exam he appears profoundly fatigued  but is in no acute distress.  He has lost approximately 4 pounds to 247. VITAL SIGNS:  Temperature was 100.4, pulse was 72 and regular, and  respiratory rate  16 and unlabored.  Blood pressure was 120/82.  HEENT:  Pupils were small, most likely related to the Percocet he has  been taking for pain.  He had no scleral icterus and there was no  jaundice.  Slight arteriolar narrowing is noted on fundal exam.  Otolaryngological exam is  unremarkable.  Nares and otic canals are  patent.  There is no erythema.  He has no lymphadenopathy about the  head, neck or axilla.  CHEST:  Chest is clear with no increased work of breathing.  He has an  S4 with slurring.  All pulses are intact and there is no pedal edema.  ABDOMEN:  He has no aortic aneurysm on exam of the abdomen.  The abdomen  was diffusely tender, particularly in the left lower quadrant.  GENITOURINARY:  Exam is deferred as this was performed in the emergency  room.  MUSCULOSKELETAL:  He is generally weak, but there are no neuromuscular  or neuropsychiatric deficits.   PLAN:  He will be admitted with abdominal pain, fever and hematuria.  GI  and urologic consultations will be pursued based on the findings of  repeat CT scans.      Titus Dubin. Alwyn Ren, MD,FACP,FCCP  Electronically Signed     WFH/MEDQ  D:  10/15/2006  T:  10/15/2006  Job:  [id number]   cc:   Boston Service, M.D.  Wilhemina Bonito. Marina Goodell, MD

## 2011-04-09 NOTE — Letter (Signed)
November 06, 2006    Brian Cortez. Marina Goodell, MD  [address]  [place], [place] [address]   RE:  Brian Cortez, Brian Cortez  MRN:  [id number]  /  DOB:  02/09/1951   Dear Brian Cortez:   I am responding to your question as to review of Brian Cortez  ultrasound, which was done on November 28, 2005.  In view of his recent  laparoscopic cholecystectomy on October 14, 2006, which showed acute as  well as chronic cholecystitis, I have reviewed at Your request  the  ultrasound of the gallbladder from December 06, 2005, which was done  for  epigastric pain and dyspepsia.He complained  to Dr. Alwyn Ren, as well as  to you during colonoscopy on November 26, 2005.  His ultrasound really  looks normal.  I cannot make out  any sign for chronic cholecystitis.  His gallbladder wall measures 1.8 mm overall.  As you know the normal  gallbladder wall thickness  is less than 3 mm.  I notice that on the  surgical specimen, the gallbladder wall was up to 8 mm.  His common bile  duct was normal at 4.7 mm.  His surgical report points that there was  some sludge or small stones in the common bile duct, but clearly on the  ultrasound in January of 2007, the  common bile duct appears normal  size.  I could not appreciate any sludge or stones in the gallbladder on  November 28, 2005 study.  There was no fluid around the gallbladder.  The only thing I would have suggested if I knew what the patient's  symptoms were at the time would  have been HIDA scan, which possibly  could have declared poor function.     Thanks for asking me to review this ultrasound again.    Sincerely,      Hedwig Morton. Juanda Chance, MD  Electronically Signed    DMB/MedQ  DD: 11/06/2006  DT: 11/06/2006  Job #: [id number]

## 2011-04-09 NOTE — Consult Note (Signed)
NAMEJAQWAN, WIEBER NO.:  [id number]   MEDICAL RECORD NO.:  [id number]          PATIENT TYPE:  EMS   LOCATION:  [facility]JO                         FACILITY:  [facility]   PHYSICIAN:  Bertram Millard. Dahlstedt, M.D.DATE OF BIRTH:  30-May-1951   DATE OF CONSULTATION:  10/09/2006  DATE OF DISCHARGE:                                 CONSULTATION   REASON FOR CONSULTATION:  Distended bladder/bladder diverticulum with CT  scan.   BRIEF HISTORY:  This 60 year old gentleman came to the emergency room  with upper abdominal pain.  He has been thoroughly evaluated.  Further  evaluation included a CT scan of his abdomen, which apparently showed no  significant GI abnormalities.  He was found to have a slightly large  bladder with a moderate to large diverticulum.  There was thickening of  the bladder wall as well.   The patient has not voided since he has come to the hospital.  He voided  shortly before at 2-3 in the morning.  He has not felt full, and prior  to hospitalization did not have severe voiding dysfunction.  He does  have a mild slowing of his stream and some hesitancy.  He denies any  intermittency, urgency or incontinence.  He has seen Dr. Boston Service in her office before, but for hypogonadism.  He has had normal  PSAs.  He does not have a large prostate by history.  Certainly, the  prostate was not large on the patient's CT scan.   IMPRESSION:  I sat and talked with the patient at length today.  I do  not think he needs a catheterization (this was unsuccessful by the  nurses in the ED).  As he does not feel distended, I talked with Dr.  Trudi Ida. Steinl.  As the patient does not have significant urinary  issues, he will follow-up with Dr. Wanda Plump on a p.r.n. basis.   RECOMMENDATIONS:  I suggested, based on his CT findings of a thickened  wall bladder with a diverticulum, that he may have bladder outlet  obstruction.  It certainly is not responding to the saw  palmetto that he  is taking.  I gave him our office card and told him to follow-up with  Dr. Wanda Plump within the next few weeks.      Bertram Millard. Dahlstedt, M.D.  Electronically Signed     SMD/MEDQ  D:  10/09/2006  T:  10/09/2006  Job:  [id number]   cc:   Boston Service, M.D.  Titus Dubin. Alwyn Ren, MD,FACP,FCCP

## 2011-04-09 NOTE — Consult Note (Signed)
Brian Cortez, Brian Cortez             ACCOUNT NO.:  [id number]   MEDICAL RECORD NO.:  [id number]          PATIENT TYPE:  INP   LOCATION:  3712                         FACILITY:  [facility]   PHYSICIAN:  Sharlet Salina T. Hoxworth, M.D.DATE OF BIRTH:  28-Jul-1951   DATE OF CONSULTATION:  10/15/2006  DATE OF DISCHARGE:                                 CONSULTATION   CHIEF COMPLAINT:  Abdominal pain.   HISTORY OF PRESENT ILLNESS:  I was asked by Dr. Alwyn Ren to evaluate Brian Cortez.  He is pleasant 60 year old white male.  He states that 5 days  ago and in the middle of the night he awoke with fairly sudden onset of  abdominal pain.  He describes a pressure or aching pain across his mid-  abdomen with radiation up toward his epigastrium.  This has been  associated with decreased appetite, but no nausea or vomiting.  The  patient presented that day to the emergency room for evaluation.  Laboratory evaluation and CT scan of the abdomen were obtained which  were unremarkable.  The patient was treated symptomatically and  discharged.  His pain however has persisted unabated throughout the  week.  He has had lack of appetite and has not been eating much.  No  nausea or vomiting.  He has been intermittently febrile up to about 101.  Bowel movements have been okay.  He has noted some hematuria but did  have a urinary catheterization done when he initially presented for  evaluation.  He had a similar episode of severe pain, almost 1 year ago  that resolved and has not had any chronic intermittent problems.  He is  followed for hiatal hernia and reflux, but this is a different type of  pain.   PAST MEDICAL HISTORY:  Surgery includes repair of an orbital fracture  and varicose vein stripping.  He is followed for hiatal hernia, GERD,  mild depression.   MEDICATIONS:  Lexapro and Nexium.   ALLERGIES:  None noted.   SOCIAL HISTORY:  Married.  [employer].  Does not smoke cigarettes.  Drinks  moderate  alcohol.   FAMILY HISTORY:  Noncontributory.   REVIEW OF SYSTEMS:  GENERAL:  Positive for malaise and fever.  RESPIRATORY:  No shortness of breath, cough, wheezing.  CARDIAC:  No  chest pain, palpitations, history of heart disease.  ABDOMEN:  GI as  above.  GU:  Some hematuria intermittently.  This catheterization as  described.   PHYSICAL EXAMINATION:  VITAL SIGNS:  Temperature currently 97.4, vital  signs all within normal limits.  GENERAL:  A mildly obese white male in no acute stress.  SKIN:  Warm, dry.  No rash or infection.  HEENT:  No mass or thyromegaly.  Sclerae nonicteric.  Lymph nodes  nonpalpable.  LUNGS:  Clear without wheezing or increased work of breathing.  CARDIAC:  Regular rate and rhythm.  No murmurs.  ABDOMEN:  Mildly obese.  There is a localized right upper quadrant  tenderness with some guarding.  No palpable masses or  hepatosplenomegaly.  EXTREMITIES:  No joint swelling, deformity.  NEUROLOGIC:  Alert, oriented.  LABORATORY:  White count 12.3 thousand, hemoglobin 14.3, LFTs, lipase,  amylase within normal limits.  Electrolytes abnormal for sodium of 130,  glucose 113.   CT scan of abdomen and pelvis personally reviewed.  This shows  significant marked thickening diffusely of the gallbladder wall.  No  apparent stones identified.  There is some thickening of the second  portion of duodenum as well and possibility of peptic ulcer disease was  raised.  This clearly appears more minor and secondary to the  gallbladder inflammatory change.  There is diverticulosis of the colon  without diverticulitis.   ASSESSMENT/PLAN:  Approximately 1 week of acute and persistent mid  abdominal and epigastric pain.  He has right upper quadrant tenderness  and CT scan significant for markedly thickened gallbladder wall  consistent with acute cholecystitis.  I would like to get an ultrasound  of the abdomen to better visualize the gallbladder and look for stones  and to  visualize the common duct.  Will try to get this early in the  morning.  Will keep the patient n.p.o. and he almost certainly will  require laparoscopic cholecystectomy.      Lorne Skeens. Hoxworth, M.D.  Electronically Signed     BTH/MEDQ  D:  10/15/2006  T:  10/15/2006  Job:  [id number]

## 2011-04-09 NOTE — Op Note (Signed)
Brian Cortez             ACCOUNT NO.:  [id number]   MEDICAL RECORD NO.:  [id number]          PATIENT TYPE:  INP   LOCATION:  3712                         FACILITY:  [facility]   PHYSICIAN:  Cherylynn Ridges, M.D.    DATE OF BIRTH:  1951-08-15   DATE OF PROCEDURE:  10/15/2006  DATE OF DISCHARGE:                                 OPERATIVE REPORT   PREOPERATIVE DIAGNOSIS:  Cholelithiasis with acute cholecystitis.   POSTOPERATIVE DIAGNOSIS:  Cholelithiasis with acute cholecystitis with  partially gangrenous gallbladder.   PROCEDURE:  Laparoscopic cholecystectomy with inability to perform  cholangiogram.   SURGEON:  Marta Lamas. Lindie Spruce, M.D.   ASSISTANT:  Gita Kudo, M.D.   ANESTHESIA:  General endotracheal.   ESTIMATED BLOOD LOSS:  About 100 mL.   COMPLICATIONS:  None.   CONDITION:  Stable.   INDICATIONS FOR OPERATION:  The patient is a 60 year old gentleman with  recent abdominal pain and CT scan and ultrasound demonstrating acute  cholecystitis, who comes in for a laparoscopic cholecystectomy.   FINDINGS:  The patient had acute inflammatory changes around the gallbladder  with omental adhesions, which had to be bluntly dissected away.  Because of  the small size of the cystic duct, cholangiogram could not be performed.   OPERATION:  The patient was taken to the operating room and placed on the  table in supine position.  After an adequate endotracheal anesthetic was  administered, he was prepped and draped in the usual sterile manner exposing  the midline and the right upper quadrant.   A supraumbilical curvilinear incision was made using a #11 blade, taken down  to the midline fascia.  The midline fascia was grasped using a ratcheted  grasper or a Kocher clamp and then an incision made between two Kocher  clamps into the fascia.  We bluntly dissected down to the peritoneal cavity  and then passed a pursestring suture of 0 Vicryl around the fascial opening.  This was  secured in place with a Hassan cannula, which was subsequently  passed into the peritoneal cavity.   We insufflated carbon dioxide gas up to a maximal intra-abdominal pressure  of 15 mmHg.  The patient was placed in reverse Trendelenburg.  The left side  was tilted down.  Two right-sided costal margin 5-mm cannulas and a  subxiphoid 11/12 mm cannula were passed under direct vision into the  peritoneal cavity.  Once all were in place, then the dissection was begun.   There were significant omental adhesions to the dome and the body of the  gallbladder, which were bluntly dissected away using a dissector.  We then  used a Nezhat aspirator and needle to aspirate the gallbladder for  decompression prior to grasping it with a long grasper through the lateral-  most 5-mm cannula.  We were able to grasp it.  We were able to retract it  towards the anterior abdominal wall in the right upper quadrant and dissect  out the peritoneum overlying the hepatoduodenal triangle and the triangle of  Calot.  There were significant adhesions.  However, we were able to get  adequate windows around both the artery and the cystic duct.  We passed the  proximal clip along the gallbladder side of the duct and four clips along  the artery proximally and distally, transecting the artery.  We made a  cholecystodochotomy, but the duct opening was very small and we could not  cannulate a Cook catheter, which had been passed through the anterior  abdominal wall.  Therefore, because of the intense amount of inflammation,  it was felt that it would be more prudent to go ahead, clip the distal  cystic duct, which we did so x3, transected the cystic duct, and then  dissected out the gallbladder from its bed with some difficulty because of  the intense inflammation but with minimal bleeding.  We irrigated with  saline and cauterized with a spatula dissector and obtained adequate  hemostasis.   An EndoCatch bag was used to  being the gallbladder out from the  supraumbilical site.  However, we had to enlarge the fascial incision  because of the thickness of the gallbladder wall.  Because of that, we had  to place a separate running 0 PDS suture around this fascial opening at the  time of closure.   We irrigated and aspirated and removed all gas and fluid from above the  liver after we had removed the gallbladder and all its contents.  We  injected 0.25% Marcaine at the cannula sites, including the supraumbilical  and subxiphoid areas.  The skin at the subxiphoid and supraumbilical area  were reapproximated using running subcuticular stitch of 4-0 Vicryl.  The  skin laterally and the trocar sites were closed using Dermabond with Steri-  Strips, and antibiotic ointment and sterile dressing being applied to all  wounds.  All needle, sponge counts and instrument counts were correct.      Cherylynn Ridges, M.D.  Electronically Signed     JOW/MEDQ  D:  10/15/2006  T:  10/15/2006  Job:  [id number]   cc:   Lacretia Leigh. Quintella Reichert, M.D.  Georgina Quint. Plotnikov, MD

## 2011-05-04 ENCOUNTER — Other Ambulatory Visit (INDEPENDENT_AMBULATORY_CARE_PROVIDER_SITE_OTHER): Payer: 59

## 2011-05-04 DIAGNOSIS — R635 Abnormal weight gain: Secondary | ICD-10-CM

## 2011-05-04 LAB — T4, FREE: Free T4: 0.6 ng/dL (ref 0.60–1.60)

## 2011-05-05 ENCOUNTER — Telehealth: Payer: Self-pay

## 2011-05-05 NOTE — Telephone Encounter (Signed)
Message copied by Edgardo Roys on Wed May 05, 2011 11:30 AM ------      Message from: Pecola Lawless      Created: Wed May 05, 2011 11:20 AM       The TSH & free T4 were tobe part of fasting labs for CPX; other CPX labs may need to be drawn ( BMET, lipids, CBC & dif, hepatic panel & PSA unless seeing Urologist

## 2011-05-05 NOTE — Telephone Encounter (Signed)
Please contact patient and inform him he needs to have additional labs for his pending CPX (Lipid,Hep,BMP,CBCD, PSA V70.0) If asked: this was not done the other day at [facility] due to communication through Email and not contacting the office (this is necessary to avoid mis-communication and for proper documentation)

## 2011-05-10 NOTE — Telephone Encounter (Signed)
Has appt for [facility] lab for Tuesday 6/19

## 2011-05-11 ENCOUNTER — Other Ambulatory Visit (INDEPENDENT_AMBULATORY_CARE_PROVIDER_SITE_OTHER): Payer: 59

## 2011-05-11 ENCOUNTER — Other Ambulatory Visit: Payer: 59

## 2011-05-11 DIAGNOSIS — Z0000 Encounter for general adult medical examination without abnormal findings: Secondary | ICD-10-CM

## 2011-05-11 LAB — CBC WITH DIFFERENTIAL/PLATELET
Basophils Relative: 0.4 % (ref 0.0–3.0)
Eosinophils Absolute: 0.1 10*3/uL (ref 0.0–0.7)
MCHC: 32.6 g/dL (ref 30.0–36.0)
MCV: 87.5 fl (ref 78.0–100.0)
Monocytes Absolute: 0.8 10*3/uL (ref 0.1–1.0)
Neutro Abs: 4.7 10*3/uL (ref 1.4–7.7)
Neutrophils Relative %: 67.1 % (ref 43.0–77.0)
RBC: 5.07 Mil/uL (ref 4.22–5.81)
RDW: 13.1 % (ref 11.5–14.6)

## 2011-05-11 LAB — BASIC METABOLIC PANEL
CO2: 30 mEq/L (ref 19–32)
Calcium: 9.1 mg/dL (ref 8.4–10.5)
Chloride: 104 mEq/L (ref 96–112)
Glucose, Bld: 97 mg/dL (ref 70–99)
Sodium: 138 mEq/L (ref 135–145)

## 2011-05-11 LAB — HEPATIC FUNCTION PANEL
Albumin: 4.1 g/dL (ref 3.5–5.2)
Total Protein: 6.3 g/dL (ref 6.0–8.3)

## 2011-05-11 LAB — LIPID PANEL
Cholesterol: 122 mg/dL (ref 0–200)
Triglycerides: 147 mg/dL (ref 0.0–149.0)
VLDL: 29.4 mg/dL (ref 0.0–40.0)

## 2011-05-11 NOTE — Progress Notes (Signed)
Labs only

## 2011-05-13 ENCOUNTER — Ambulatory Visit (INDEPENDENT_AMBULATORY_CARE_PROVIDER_SITE_OTHER): Payer: 59 | Admitting: Internal Medicine

## 2011-05-13 ENCOUNTER — Encounter: Payer: Self-pay | Admitting: Internal Medicine

## 2011-05-13 DIAGNOSIS — Z23 Encounter for immunization: Secondary | ICD-10-CM

## 2011-05-13 DIAGNOSIS — D126 Benign neoplasm of colon, unspecified: Secondary | ICD-10-CM

## 2011-05-13 DIAGNOSIS — I10 Essential (primary) hypertension: Secondary | ICD-10-CM

## 2011-05-13 DIAGNOSIS — Z0000 Encounter for general adult medical examination without abnormal findings: Secondary | ICD-10-CM

## 2011-05-13 DIAGNOSIS — E782 Mixed hyperlipidemia: Secondary | ICD-10-CM

## 2011-05-13 MED ORDER — TETANUS-DIPHTH-ACELL PERTUSSIS 5-2.5-18.5 LF-MCG/0.5 IM SUSP
0.5000 mL | Freq: Once | INTRAMUSCULAR | Status: AC
  Administered 2011-05-13: 0.5 mL via INTRAMUSCULAR

## 2011-05-13 NOTE — Patient Instructions (Addendum)
Preventive Health Care: Exercise at least 30-45 minutes a day,  3-4 days a week.  Eat a low-fat diet with lots of fruits and vegetables, up to 7-9 servings per day. Avoid obesity; your goal is waist measurement < 40 inches.Consume less than 40 grams of sugar per day from foods & drinks with High Fructose Corn Sugar as #2,3 or # 4 on label. Health Care Power of Attorney & Living Will. Complete if not in place ; these place you in charge of your health care decisions. Interventions to raise HDL or GOOD cholesterol include: exercising 30-45 minutes 3-4 X per week; including salmon & tuna in the diet;  & supplementing with Omega 3 fatty acids (Flax or Fish oil )  1-2 grams per day. The B vitamin Niacin also raises HDL but has a vasodilating effect which may cause flushing. Consider a BMD as discussed. Avoid stimulants. Isometrics pre standing

## 2011-05-13 NOTE — Progress Notes (Signed)
  Subjective:    Patient ID: Brian Cortez, male    DOB: [birth year], 60 y.o.   MRN: [id number]  HPI  Mr Brian Cortez  is here for a physical;acute issues include positional lightheadedness standing up after seated for a while. BP averages 120-123/80, as low as 110/70.      Review of Systems Patient reports no significant   vision/ hearing changes,anorexia, fever ,adenopathy, persistant / recurrent hoarseness, swallowing issues, chest pain,palpitations, edema,persistant / recurrent cough, hemoptysis, dyspnea(rest, exertional, paroxysmal nocturnal), gastrointestinal  bleeding (melena, rectal bleeding), abdominal pain, excessive heart burn, GU symptoms( dysuria, hematuria, pyuria, voiding/incontinence  issues) syncope, focal weakness, memory loss,numbness & tingling, skin/hair/nail changes,depression, anxiety, abnormal bruising/bleeding, musculoskeletal symptoms/signs. 7# weight loss with TLC.     Objective:   Physical Exam Gen.: Healthy and well-nourished in appearance. Alert, appropriate and cooperative throughout exam. Head: Normocephalic without obvious abnormalities. Eyes: No corneal or conjunctival inflammation noted. Pupils equal round reactive to light and accommodation. Fundal exam is benign without hemorrhages, exudate, papilledema. Extraocular motion intact. Vision grossly normal. Ears: External  ear exam reveals no significant lesions or deformities. Canals clear .TMs normal. Hearing is grossly normal bilaterally. Nose: External nasal exam reveals no deformity or inflammation. Nasal mucosa are pink and moist. No lesions or exudates noted.  Mouth: Oral mucosa and oropharynx reveal no lesions or exudates. Teeth in good repair. Neck: No deformities, masses, or tenderness noted. Range of motion &. Thyroid normal. Lungs: Normal respiratory effort; chest expands symmetrically. Lungs are clear to auscultation without rales, wheezes, or increased work of breathing. Heart: Normal rate and rhythm.  Normal S1 and S2. No gallop, click, or rub. No  murmur. Abdomen: Bowel sounds normal; abdomen soft and nontender. No masses, organomegaly or hernias noted. Genitalia: Dr Marcello Fennel.                                                                                      Musculoskeletal/extremities: No deformity or scoliosis noted of  the thoracic or lumbar spine. No clubbing, cyanosis,  or deformity noted. Range of motion  normal .Tone & strength  normal.Joints normal. Nail health good. Trace edema. Vascular: Carotid, radial artery, dorsalis pedis and dorsalis posterior tibial pulses are full and equal. No bruits present. Varicose veins .Neurologic: Alert and oriented x3. Deep tendon reflexes symmetrical and normal.          Skin: Intact without suspicious lesions or rashes. Lymph: No cervical, axillary, or inguinal lymphadenopathy present. Psych: Mood and affect are normal. Normally interactive                                                                                         Assessment & Plan:  #1 comprehensive physical exam; no acute findings #2 see Problem List with Assessments & Recommendations Plan: see Orders

## 2011-06-21 ENCOUNTER — Other Ambulatory Visit: Payer: Self-pay | Admitting: Internal Medicine

## 2011-07-21 ENCOUNTER — Ambulatory Visit (INDEPENDENT_AMBULATORY_CARE_PROVIDER_SITE_OTHER): Payer: 59 | Admitting: Internal Medicine

## 2011-07-21 ENCOUNTER — Encounter: Payer: Self-pay | Admitting: Internal Medicine

## 2011-07-21 VITALS — BP 106/64 | HR 80 | Temp 98.6°F | Wt 265.4 lb

## 2011-07-21 DIAGNOSIS — J069 Acute upper respiratory infection, unspecified: Secondary | ICD-10-CM

## 2011-07-21 MED ORDER — AMOXICILLIN 500 MG PO CAPS: 500.0000 mg | ORAL_CAPSULE | Freq: Three times a day (TID) | ORAL | Status: AC

## 2011-07-21 MED ORDER — FLUTICASONE PROPIONATE 50 MCG/ACT NA SUSP: 1.0000 | NASAL | Status: DC

## 2011-07-21 NOTE — Patient Instructions (Signed)
Plain Mucinex for thick secretions ;force NON dairy fluids for next 48 hrs. Use a Neti pot daily as needed for sinus congestion 

## 2011-07-21 NOTE — Progress Notes (Signed)
  Subjective:    Patient ID: Brian Cortez, male    DOB: 1951/04/16, 60 y.o.   MRN: [id number]  HPI Respiratory tract infection Onset/symptoms:2 weeks ago as L  ear popping Exposures (illness/environmental/extrinsic):ill employee & family member Progression of symptoms:to R ear as popping & then drainage, ST Treatments/response:Zyrtec bid  Present symptoms:malaise Fever/chills/sweats:temp to ? Frontal headache:no Facial pain:no Nasal purulence:no Dental pain:no Lymphadenopathy:no Wheezing/shortness of breath:no Cough/sputum:no Associated extrinsic/allergic symptoms:itchy eyes/ sneezing:sneezing Past medical history: Seasonal allergies:occasionally/asthma:no Smoking history:never           Review of Systems     Objective:   Physical Exam General appearance is of good health and nourishment; no acute distress or increased work of breathing is present.  No  lymphadenopathy about the head, neck, or axilla noted.   Eyes: No conjunctival inflammation or lid edema is present.   Ears:  External ear exam shows no significant lesions or deformities.  Otoscopic examination reveals clear canals, tympanic membranes are intact bilaterally without bulging, retraction, inflammation or discharge. L TM dull   Nose:  External nasal examination shows no deformity or inflammation. Nasal mucosa are  Mildly erythematous without lesions or exudates. No septal dislocation or dislocation.No obstruction to airflow.   Oral exam: Dental hygiene is good; lips and gums are healthy appearing.There is no oropharyngeal erythema or exudate noted.    Heart:  Normal rate and regular rhythm. S1 and S2 normal without gallop, murmur, click, rub or other extra sounds.   Lungs:Chest clear to auscultation; no wheezes, rhonchi,rales ,or rubs present.No increased work of breathing.    Extremities:  No cyanosis or clubbing  noted    Skin: Warm & dry          Assessment & Plan:  #1 upper respiratory  tract infection without frank criteria for rhinosinusitis. Interventions to reverse the process were discussed.  Plan: See orders and recommendations.

## 2011-08-23 ENCOUNTER — Other Ambulatory Visit: Payer: Self-pay | Admitting: Internal Medicine

## 2011-11-23 HISTORY — PX: CATARACT EXTRACTION W/ INTRAOCULAR LENS  IMPLANT, BILATERAL: SHX1307

## 2011-11-24 ENCOUNTER — Other Ambulatory Visit: Payer: Self-pay | Admitting: Internal Medicine

## 2011-11-24 MED ORDER — NEXIUM 40 MG PO CPDR: 40.0000 mg | DELAYED_RELEASE_CAPSULE | Freq: Every day | ORAL | Status: DC

## 2011-11-24 NOTE — Telephone Encounter (Signed)
Okay to refill medication for one year. No office visit needed.

## 2011-11-24 NOTE — Telephone Encounter (Signed)
Rx refilled for 1 year  

## 2011-11-24 NOTE — Telephone Encounter (Signed)
Pt requesting refill on Nexium. Last office visit with Dr. Marina Goodell 06/25/09. Last colon with Dr. Marina Goodell 01/07/11. Dr. Marina Goodell are you ok refilling the Nexium or does the pt need an OV prior to refill. Wife states he only has 3 pills left. Please advise.

## 2011-11-25 ENCOUNTER — Telehealth: Payer: Self-pay

## 2011-11-25 NOTE — Telephone Encounter (Signed)
called to get prior auth on pt's nexium; rx was authorized

## 2011-11-29 ENCOUNTER — Telehealth: Payer: Self-pay

## 2011-11-29 ENCOUNTER — Other Ambulatory Visit: Payer: Self-pay

## 2011-11-29 MED ORDER — NEXIUM 40 MG PO CPDR: 40.0000 mg | DELAYED_RELEASE_CAPSULE | Freq: Every day | ORAL | Status: DC

## 2011-11-29 NOTE — Telephone Encounter (Signed)
Received information from [facility] that rx for Nexium had been authorized; refilled rx for Nexium with 11 refills

## 2012-05-15 ENCOUNTER — Other Ambulatory Visit: Payer: Self-pay | Admitting: Internal Medicine

## 2012-05-15 NOTE — Telephone Encounter (Signed)
Lipid/Hep 272.4/995.20  

## 2012-05-16 ENCOUNTER — Telehealth: Payer: Self-pay | Admitting: Internal Medicine

## 2012-05-16 DIAGNOSIS — Z0000 Encounter for general adult medical examination without abnormal findings: Secondary | ICD-10-CM

## 2012-05-16 NOTE — Telephone Encounter (Signed)
Please  schedule fasting Labs : BMET,Lipids, hepatic panel, CBC & dif, TSH. V70.0. [facility] OK.

## 2012-05-16 NOTE — Telephone Encounter (Signed)
Dr.Hopper please clarify this message. Patient would like to get any labs due at [facility], In order for me to place future orders for [facility], I need to know what labs patient is to get.   Side Notes: Last CPX was June 2012. Patient is due for a CPX with fasting labs (no pending appointment). Based on this message it appears you would like for patient to have PSA level checked? If yes please provide DX code (for CPX code can not be used-patient does not have a pending CPX appointment)

## 2012-05-16 NOTE — Telephone Encounter (Signed)
Pt returned my call. Would like to have labs done at [facility] location. Can you put in the orders please? He would like to go on Thursday. Thank you!

## 2012-05-16 NOTE — Telephone Encounter (Signed)
Message copied by Marshell Garfinkel on Tue May 16, 2012 10:24 AM ------      Message from: Pecola Lawless      Created: Mon May 15, 2012  1:40 PM       Please see if he were going to have followup labs. The computer indicated that a followup PSA was never completed last June as ordered

## 2012-05-16 NOTE — Telephone Encounter (Signed)
Lmovm for patient to return call.  

## 2012-05-17 NOTE — Telephone Encounter (Signed)
Orders placed.

## 2012-05-17 NOTE — Telephone Encounter (Signed)
Chrae, can you put these orders in the system? Thank you!

## 2012-05-18 ENCOUNTER — Other Ambulatory Visit (INDEPENDENT_AMBULATORY_CARE_PROVIDER_SITE_OTHER): Payer: 59

## 2012-05-18 DIAGNOSIS — Z0000 Encounter for general adult medical examination without abnormal findings: Secondary | ICD-10-CM

## 2012-05-18 LAB — CBC WITH DIFFERENTIAL/PLATELET
Basophils Absolute: 0 10*3/uL (ref 0.0–0.1)
Eosinophils Absolute: 0.1 10*3/uL (ref 0.0–0.7)
HCT: 49.5 % (ref 39.0–52.0)
Lymphs Abs: 1.2 10*3/uL (ref 0.7–4.0)
MCHC: 33 g/dL (ref 30.0–36.0)
MCV: 87.7 fl (ref 78.0–100.0)
Monocytes Absolute: 0.7 10*3/uL (ref 0.1–1.0)
Neutrophils Relative %: 69.1 % (ref 43.0–77.0)
Platelets: 203 10*3/uL (ref 150.0–400.0)
RDW: 15.1 % — ABNORMAL HIGH (ref 11.5–14.6)

## 2012-05-18 LAB — BASIC METABOLIC PANEL
BUN: 22 mg/dL (ref 6–23)
CO2: 30 mEq/L (ref 19–32)
Chloride: 103 mEq/L (ref 96–112)
GFR: 71.51 mL/min (ref >60.00)
Glucose, Bld: 71 mg/dL (ref 70–99)
Potassium: 4.6 mEq/L (ref 3.5–5.1)
Sodium: 139 mEq/L (ref 135–145)

## 2012-05-18 LAB — HEPATIC FUNCTION PANEL
ALT: 30 U/L (ref 0–53)
AST: 36 U/L (ref 0–37)
Alkaline Phosphatase: 47 U/L (ref 39–117)
Bilirubin, Direct: 0.1 mg/dL (ref 0.0–0.3)
Total Bilirubin: 0.9 mg/dL (ref 0.3–1.2)
Total Protein: 6.7 g/dL (ref 6.0–8.3)

## 2012-05-18 LAB — TSH: TSH: 0.89 u[IU]/mL (ref 0.35–5.50)

## 2012-05-18 LAB — LIPID PANEL
Cholesterol: 128 mg/dL (ref 0–200)
VLDL: 24 mg/dL (ref 0.0–40.0)

## 2012-05-26 ENCOUNTER — Encounter: Payer: Self-pay | Admitting: *Deleted

## 2012-06-13 ENCOUNTER — Other Ambulatory Visit: Payer: Self-pay | Admitting: Internal Medicine

## 2012-06-13 NOTE — Telephone Encounter (Signed)
Lipid/hep 272.4/995.20  

## 2012-07-14 ENCOUNTER — Other Ambulatory Visit: Payer: Self-pay | Admitting: Internal Medicine

## 2012-07-21 ENCOUNTER — Other Ambulatory Visit: Payer: Self-pay | Admitting: Internal Medicine

## 2012-07-21 NOTE — Telephone Encounter (Signed)
Refill done.  

## 2012-08-26 ENCOUNTER — Other Ambulatory Visit: Payer: Self-pay | Admitting: Internal Medicine

## 2012-10-18 ENCOUNTER — Encounter: Payer: Self-pay | Admitting: Internal Medicine

## 2012-10-18 ENCOUNTER — Ambulatory Visit (INDEPENDENT_AMBULATORY_CARE_PROVIDER_SITE_OTHER): Payer: 59 | Admitting: Internal Medicine

## 2012-10-18 VITALS — BP 132/82 | HR 71 | Temp 98.2°F | Resp 12 | Ht 76.5 in | Wt 258.4 lb

## 2012-10-18 DIAGNOSIS — Z0000 Encounter for general adult medical examination without abnormal findings: Secondary | ICD-10-CM

## 2012-10-18 DIAGNOSIS — G479 Sleep disorder, unspecified: Secondary | ICD-10-CM

## 2012-10-18 DIAGNOSIS — E349 Endocrine disorder, unspecified: Secondary | ICD-10-CM | POA: Insufficient documentation

## 2012-10-18 DIAGNOSIS — E782 Mixed hyperlipidemia: Secondary | ICD-10-CM

## 2012-10-18 DIAGNOSIS — E291 Testicular hypofunction: Secondary | ICD-10-CM

## 2012-10-18 NOTE — Progress Notes (Signed)
Subjective:    Patient ID: Brian Cortez, male    DOB: 16-Apr-1951, 61 y.o.   MRN: [id number]  HPI Mr Kuan  is here for a physical;acute concerns involve sleep issues.     Review of Systems   Insomnia Onset: 18 mos Pattern: Difficulty going to sleep:no Frequent awakening:yes Early awakening:yes after 2-4 hrs then sleeps in chair in den for 2-5 hrs Nightmares:only rarely Abnormal leg movement:no Snoring:"normal snoring" Apnea:no Risk factors/sleep hygiene: Stimulants:occasional diet cola @ night Alcohol intake:not a factor Reading, watching TV, eating in bed:no Daytime naps:no Stress/anxiety:no Work/travel factors:no Impact: Daytime hypersomnolence: no Motor vehicle accident/motor dysfunction:no Treatment to date/efficacy:Melatonin/ no reponse   HYPERTENSION: Disease Monitoring: Blood pressure range-125-130/75-80  Chest pain, palpitations- no       Dyspnea- no Medications: Compliance- yes  Lightheadedness,Syncope- no    Edema- no  FASTING HYPERGLYCEMIA, PMH of:  Disease Monitoring: Blood Sugar ranges-no monitor  Polyuria/phagia/dipsia- no       Visual problems- no  HYPERLIPIDEMIA: Disease Monitoring: See symptoms for Hypertension Medications: Compliance- yes  Abd pain, bowel changes-chronic loose to watery stool for 12 mos+; no treatment   Muscle aches- no        Objective:   Physical Exam Gen.:  well-nourished in appearance. Alert, appropriate and cooperative throughout exam. Head: Normocephalic without obvious abnormalities  Eyes: No corneal or conjunctival inflammation noted. Pupils equal round reactive to light and accommodation. Fundal exam is benign without hemorrhages, exudate, papilledema. Extraocular motion intact. Vision grossly normal. Ears: External  ear exam reveals no significant lesions or deformities. Canals clear .TMs normal. Hearing is grossly normal bilaterally. Nose: External nasal exam reveals no deformity or inflammation. Nasal  mucosa are pink and moist. No lesions or exudates noted.   Mouth: Oral mucosa and oropharynx reveal no lesions or exudates. Teeth in good repair. Neck: No deformities, masses, or tenderness noted. Range of motion & Thyroid normal. Lungs: Normal respiratory effort; chest expands symmetrically. Lungs are clear to auscultation without rales, wheezes, or increased work of breathing. Heart: Normal rate and rhythm. Normal S1 and S2. No gallop, click, or rub. No murmur. Abdomen: Bowel sounds normal; abdomen soft and nontender. No masses, organomegaly or hernias noted. Genitalia: Dr Patsi Sears, Urology                               Musculoskeletal/extremities: Accentuated curvature of upper thoracic  spine.  No clubbing, cyanosis, edema, or deformity noted. Range of motion  normal .Tone & strength  normal.Joints normal. Nail health  good. Vascular: Carotid, radial artery, dorsalis pedis and  posterior tibial pulses are full and equal. No bruits present. Neurologic: Alert and oriented x3. Deep tendon reflexes symmetrical and normal.          Skin: Intact without suspicious lesions or rashes. Lymph: No cervical, axillary lymphadenopathy present. Psych: Mood and affect are normal. 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  Plan: see Orders

## 2012-10-18 NOTE — Patient Instructions (Addendum)
Preventive Health Care: Exercise at least 30-45 minutes a day,  3-4 days a week.  Eat a low-fat diet with lots of fruits and vegetables, up to 7-9 servings per day.  Consume less than 40 grams of sugar per day from foods & drinks with High Fructose Corn Sugar as #1,2,3 or # 4 on label. Health Care Power of Attorney & Living Will. Complete if not in place ; these place you in charge of your health care decisions. To prevent sleep dysfunction follow these instructions for sleep hygiene. Do not read, watch TV, or eat in bed. Do not get into bed until you are ready to turn off the light &  to go to sleep. Do not ingest stimulants ( decongestants, diet pills, nicotine, caffeine) after the evening meal. Please take the probiotic , Align, every day until the bowels are normal. This will replace the normal bacteria which  are necessary for formation of normal stool and processing of food.  Review and correct the record as indicated. Please share record with all medical staff seen.  Take the EKG to any emergency room or preop visits. There are nonspecific  T voltage changes in aVL & V6; as long as there is no new change these are not clinically significant.    If you activate My Chart; the results can be released to you as soon as they populate from the lab. If you choose not to use this program; the labs have to be reviewed, copied & mailed   causing a delay in getting the results to you.

## 2012-10-23 ENCOUNTER — Other Ambulatory Visit: Payer: Self-pay | Admitting: Internal Medicine

## 2012-10-24 ENCOUNTER — Ambulatory Visit (INDEPENDENT_AMBULATORY_CARE_PROVIDER_SITE_OTHER)
Admission: RE | Admit: 2012-10-24 | Discharge: 2012-10-24 | Disposition: A | Discharge status: Home / Self Care | Payer: 59 | Source: Ambulatory Visit

## 2012-10-24 DIAGNOSIS — E291 Testicular hypofunction: Secondary | ICD-10-CM

## 2012-10-24 DIAGNOSIS — E349 Endocrine disorder, unspecified: Secondary | ICD-10-CM

## 2012-12-15 ENCOUNTER — Other Ambulatory Visit: Payer: Self-pay | Admitting: Internal Medicine

## 2012-12-18 ENCOUNTER — Encounter: Payer: Self-pay | Admitting: Internal Medicine

## 2012-12-18 ENCOUNTER — Other Ambulatory Visit: Payer: Self-pay | Admitting: Internal Medicine

## 2012-12-18 MED ORDER — NEXIUM 40 MG PO CPDR: 40.0000 mg | DELAYED_RELEASE_CAPSULE | Freq: Every day | ORAL | Status: DC

## 2012-12-18 NOTE — Telephone Encounter (Signed)
Refilled Nexium - patient has upcoming appointment with Dr. Marina Goodell

## 2012-12-19 ENCOUNTER — Telehealth: Payer: Self-pay | Admitting: Internal Medicine

## 2012-12-20 ENCOUNTER — Telehealth: Payer: Self-pay

## 2012-12-20 MED ORDER — NEXIUM 40 MG PO CPDR: 40.0000 mg | DELAYED_RELEASE_CAPSULE | Freq: Every day | ORAL | Status: DC

## 2012-12-20 NOTE — Telephone Encounter (Signed)
Called [facility] to get prior authorization for Nexium.  Authorization was given and sent refill to [facility]

## 2012-12-20 NOTE — Telephone Encounter (Signed)
Left message on patient's voicemail that prior authorization on Nexium had been received and a new rx sent to pharmacy

## 2013-01-15 ENCOUNTER — Encounter: Payer: Self-pay | Admitting: Internal Medicine

## 2013-01-15 ENCOUNTER — Ambulatory Visit (INDEPENDENT_AMBULATORY_CARE_PROVIDER_SITE_OTHER): Payer: 59 | Admitting: Internal Medicine

## 2013-01-15 VITALS — BP 118/80 | HR 80 | Ht 75.25 in | Wt 269.2 lb

## 2013-01-15 DIAGNOSIS — Z860100 Personal history of colon polyps, unspecified: Secondary | ICD-10-CM

## 2013-01-15 DIAGNOSIS — R197 Diarrhea, unspecified: Secondary | ICD-10-CM

## 2013-01-15 DIAGNOSIS — K219 Gastro-esophageal reflux disease without esophagitis: Secondary | ICD-10-CM

## 2013-01-15 MED ORDER — ESOMEPRAZOLE MAGNESIUM 40 MG PO CPDR: 40.0000 mg | DELAYED_RELEASE_CAPSULE | Freq: Every day | ORAL | Status: DC

## 2013-01-15 MED ORDER — METRONIDAZOLE 250 MG PO TABS: 250.0000 mg | ORAL_TABLET | Freq: Four times a day (QID) | ORAL | Status: DC

## 2013-01-15 NOTE — Patient Instructions (Addendum)
We have sent the following medications to your pharmacy for you to pick up at your convenience: Nexium and Metronidazole  If you are still having problems after finishing the Metronidazole, try using Imodium as discussed with Dr. Marina Goodell

## 2013-01-15 NOTE — Progress Notes (Signed)
HISTORY OF PRESENT ILLNESS:  Brian Cortez is a 62 y.o. male with multiple medical problems as listed below. He is followed in this office for chronic GERD and a history of adenomatous colon polyps. Patient presents today regarding routine checkup for GERD (last seen in the office 2010) and a 2 year history of intermittent diarrhea. In terms of GERD, he continues on Nexium 40 mg daily. On medication, no symptoms. Off medication significant symptoms. He has undergone prior upper endoscopy and does not have Barrett's. He is undergone prior colonoscopy in 2001, 2007, and most recently February 2012 for adenomatous colon polyps. In addition to polyps, mild sigmoid diverticulosis and small internal hemorrhoids. Routine followup in 2017 recommended. Patient reports a 2 month history of intermittent problems with loose diarrheal stools. No associated urgency, pain, or bleeding. No nocturnal component. Laboratories from June 2013 were unremarkable including electrolytes and albumin. He denies data review. He denies new medications other than testosterone. No problems with constipation. Several loose bowels per week. He has tried the [employer], which he states helps some. No weight loss. He is status post cholecystectomy remotely with no postoperative bowel changes for years.  REVIEW OF SYSTEMS:  All non-GI ROS negative except for anxiety, sinus and allergy, sleep problems  Past Medical History  Diagnosis Date  . Hyperlipidemia   . Hyperglycemia 2001    w/ normal A1c   . Liver enzyme elevation 2004  . Gilbert syndrome   . Colonic polyp     adenomatous, PMH of Dr Marina Goodell  . Diverticulosis   . Urethral stricture   . Obstructive sleep apnea (adult) (pediatric)   . Pure hypercholesterolemia   . Other symptoms involving respiratory system and chest   . Insomnia with sleep apnea, unspecified   . Testosterone deficiency   . High cholesterol     controlled  . Anxiety   . Obesity   . Cardiac  abnormality   . Carpal tunnel syndrome   . DDD (degenerative disc disease), cervical   . GERD (gastroesophageal reflux disease)     Past Surgical History  Procedure Laterality Date  . Polypectomy  2007 & 2012    X2 ; Dr Marina Goodell  . Cystoscopy      X2  . Urethral dilation      secondary to trauma; Dr Wanda Plump  . Tonsillectomy    . Vasectomy    . Cholecystectomy  11/07  . Varicose vein surgery  2002  . Facial fracture surgery  1987    blow out fracture & occipital fracture from basketball injuly   . Cataract extraction, bilateral  2&01/2012  . Obital optic      left eye: 1986    Social History Brian Cortez  reports that he has never smoked. He has never used smokeless tobacco. He reports that he drinks about 6.0 ounces of alcohol per week. He reports that he does not use illicit drugs.  family history includes Heart attack in his mother and paternal grandfather; Heart attack (age of onset: 62) in his paternal uncle; Heart attack (age of onset: 103) in his father; Heart attack (age of onset: 28) in his maternal uncle; Prostate cancer in his paternal uncle; and Stroke (age of onset: 74) in his father.  There is no history of Colon cancer.  Allergies  Allergen Reactions  . Pollen Extract        PHYSICAL EXAMINATION: Vital signs: BP 118/80  Pulse 80  Ht 6' 3.25" (1.911 m)  Wt 269  lb 4 oz (122.131 kg)  BMI 33.44 kg/m2 General: Well-developed, well-nourished, no acute distress HEENT: Sclerae are anicteric, conjunctiva pink. Oral mucosa intact Lungs: Clear Heart: Regular Abdomen: soft, nontender, nondistended, no obvious ascites, no peritoneal signs, normal bowel sounds. No organomegaly. Extremities: No edema Psychiatric: alert and oriented x3. Cooperative     ASSESSMENT:  #1. Chronic intermittent diarrhea. Rule out bacterial overgrowth. Rule out occult infection like Giardia #2. GERD. Stable on Nexium #3. History of adenomatous colon polyps. Last surveillance  colonoscopy February 2012     PLAN:  #1. Empiric trial of metronidazole 250 mg by mouth 4 times a day x10 days. Advised not to consume alcohol concurrently. One refill for the future if this is effective #2. After trial of metronidazole, if problems with diarrhea persists, use low-dose Imodium as instructed. Titrated to need #3. Surveillance colonoscopy around February 2017. #4. Reflux precautions #5. Refill Nexium 40 mg daily as requested #6. Interval office followup as needed. He was told to contact the office for any questions or problems.

## 2013-02-20 ENCOUNTER — Encounter: Payer: Self-pay | Admitting: Neurology

## 2013-02-20 ENCOUNTER — Ambulatory Visit (INDEPENDENT_AMBULATORY_CARE_PROVIDER_SITE_OTHER): Payer: 59 | Admitting: Neurology

## 2013-02-20 VITALS — BP 142/95 | HR 82 | Ht 76.0 in | Wt 260.0 lb

## 2013-02-20 DIAGNOSIS — G4733 Obstructive sleep apnea (adult) (pediatric): Secondary | ICD-10-CM

## 2013-02-20 NOTE — Patient Instructions (Addendum)
There have been no compliance issues with CPAP - fully complian ton 9 cm water CPAP with 2 cm EPR and nasal pillows.   I have discussed with him the necessity to continue and the successful result of the reduced AHI  ,the prolonged sleep time.  He feels his Epworth sleepiness score today  is 5-6 points , in itself a food progress marker. He will be seen again in 6 month .    Sleep Apnea  Sleep apnea is a sleep disorder characterized by abnormal pauses in breathing while you sleep. When your breathing pauses, the level of oxygen in your blood decreases. This causes you to move out of deep sleep and into light sleep. As a result, your quality of sleep is poor, and the system that carries your blood throughout your body (cardiovascular system) experiences stress. If sleep apnea remains untreated, the following conditions can develop:  High blood pressure (hypertension).  Coronary artery disease.  Inability to achieve or maintain an erection (impotence).  Impairment of your thought process (cognitive dysfunction). There are three types of sleep apnea: 1. Obstructive sleep apnea Pauses in breathing during sleep because of a blocked airway. 2. Central sleep apnea Pauses in breathing during sleep because the area of the brain that controls your breathing does not send the correct signals to the muscles that control breathing. 3. Mixed sleep apnea A combination of both obstructive and central sleep apnea. RISK FACTORS The following risk factors can increase your risk of developing sleep apnea:  Being overweight.  Smoking.  Having narrow passages in your nose and throat.  Being of older age.  Being male.  Alcohol use.  Sedative and tranquilizer use.  Ethnicity. Among individuals younger than 35 years, African Americans are at increased risk of sleep apnea. SYMPTOMS   Difficulty staying asleep.  Daytime sleepiness and fatigue.  Loss of energy.  Irritability.  Loud, heavy  snoring.  Morning headaches.  Trouble concentrating.  Forgetfulness.  Decreased interest in sex. DIAGNOSIS  In order to diagnose sleep apnea, your caregiver will perform a physical examination. Your caregiver may suggest that you take a home sleep test. Your caregiver may also recommend that you spend the night in a sleep lab. In the sleep lab, several monitors record information about your heart, lungs, and brain while you sleep. Your leg and arm movements and blood oxygen level are also recorded. TREATMENT The following actions may help to resolve mild sleep apnea:  Sleeping on your side.   Using a decongestant if you have nasal congestion.   Avoiding the use of depressants, including alcohol, sedatives, and narcotics.   Losing weight and modifying your diet if you are overweight. There also are devices and treatments to help open your airway:  Oral appliances. These are custom-made mouthpieces that shift your lower jaw forward and slightly open your bite. This opens your airway.  Devices that create positive airway pressure. This positive pressure "splints" your airway open to help you breathe better during sleep. The following devices create positive airway pressure:  Continuous positive airway pressure (CPAP) device. The CPAP device creates a continuous level of air pressure with an air pump. The air is delivered to your airway through a mask while you sleep. This continuous pressure keeps your airway open.  Nasal expiratory positive airway pressure (EPAP) device. The EPAP device creates positive air pressure as you exhale. The device consists of single-use valves, which are inserted into each nostril and held in place by adhesive. The valves  create very little resistance when you inhale but create much more resistance when you exhale. That increased resistance creates the positive airway pressure. This positive pressure while you exhale keeps your airway open, making it easier  to breath when you inhale again.  Bilevel positive airway pressure (BPAP) device. The BPAP device is used mainly in patients with central sleep apnea. This device is similar to the CPAP device because it also uses an air pump to deliver continuous air pressure through a mask. However, with the BPAP machine, the pressure is set at two different levels. The pressure when you exhale is lower than the pressure when you inhale.  Surgery. Typically, surgery is only done if you cannot comply with less invasive treatments or if the less invasive treatments do not improve your condition. Surgery involves removing excess tissue in your airway to create a wider passage way. Document Released: 10/29/2002 Document Revised: 05/09/2012 Document Reviewed: 03/16/2012 [facility] Patient Information 2013 [place], [place].

## 2013-02-20 NOTE — Assessment & Plan Note (Signed)
1)Patient ot continue treatment of OSA by CPAP -  currently set at 9 cm water with 2 cm EPR. [facility] follows him as DME.   2) morbid obesity - low carb diet encouraged and discussed. His main risk factor for OSA.  3) ASTHMA - CURRENTLY ON STEROIDS , 4) HTN.

## 2013-02-20 NOTE — Progress Notes (Signed)
Patient ID: Brian Cortez, male   DOB: 12-Jan-1951, 62 y.o.   MRN: [id number]  please review initial visit note from December 2013 .   Mr. Bouillon primary care physician is Dr. [name] she originally was diagnosed in a home sleep test date at 11/14/2012 with sleep apnea at an apnea hypotony index over 40 an hour he also had related hypoxic at the results and of variable pulse rate.he was titrated in lab on  12-29-2012 and  Is now using CPAP at a pressure  of 8 cm with a 2 cm EPR .  His AHI is now 2.3,  he had some  minor leaks .  He reports improvement in his sleep duration  -and less frequently  waking up from sleep. He gets 6.5 -7 hours of sleep on CPAP. He is  currently treated for acute sinusitis with antibiotics and steroids , prescribed by an urgent care.  He is followed by [facility] as his DME.  He may need a break from CPAP for 2-3 days to cure his rhinitis.  HIS OSAs main risk factor is obesity  .HIS CO- MORBIDITIES ARE UNCHANGED.    He carries a diagnosis of asthma and was last treated with inhalers in 2012. He is using a nasal pillow . A download was obtained in office and compared to data from sleep study and first download. The patient is 100% compliant.   Unchanged soc history, medical history , family history - all reviewed. Marland KitchenPMH  The patient has today ciliary injection , a runny nose and chest auscultation was postive for rhonchi.  No rash , no edema. Mallampati 3 and reddened palate , TMJ click with jaw opening and closure.    He remains obese.   Mental Status: Alert, oriented, thought content appropriate.  Speech fluent without evidence of aphasia. Able to follow 3 step commands without difficulty. Cranial Nerves: II-Discs flat bilaterally. Visual fields grossly intact. III/IV/VI-Extraocular movements intact.  Pupils reactive bilaterally. V/VII-Smile symmetric VIII- intact IX/X-normal gag XI-bilateral shoulder shrug XII-midline tongue extension Motor: 5/5  bilaterally with normal tone and bulk Sensory: Pinprick and light touch intact throughout, bilaterally Deep Tendon Reflexes: 2+ and symmetric throughout Plantars: Downgoing bilaterally Cerebellar: Normal finger-to-nose, normal rapid alternating movements and normal heel-to-shin test.  Normal gait and station.

## 2013-02-22 ENCOUNTER — Encounter: Payer: Self-pay | Admitting: Neurology

## 2013-06-23 ENCOUNTER — Encounter: Payer: Self-pay | Admitting: Neurology

## 2013-07-30 ENCOUNTER — Other Ambulatory Visit: Payer: Self-pay | Admitting: Internal Medicine

## 2013-07-31 NOTE — Telephone Encounter (Signed)
Med filled #30 with 0 refills. Pt due for fasting labs. Last set completed on 04/2012.

## 2013-08-07 ENCOUNTER — Emergency Department (HOSPITAL_COMMUNITY): Payer: 59

## 2013-08-07 ENCOUNTER — Other Ambulatory Visit: Payer: Self-pay | Admitting: Internal Medicine

## 2013-08-07 ENCOUNTER — Encounter (HOSPITAL_COMMUNITY): Payer: Self-pay | Admitting: Emergency Medicine

## 2013-08-07 ENCOUNTER — Telehealth: Payer: Self-pay | Admitting: Internal Medicine

## 2013-08-07 ENCOUNTER — Observation Stay (HOSPITAL_COMMUNITY)
Admission: EM | Admit: 2013-08-07 | Discharge: 2013-08-08 | Disposition: A | Discharge status: Home / Self Care | Payer: 59 | Attending: Internal Medicine | Admitting: Internal Medicine

## 2013-08-07 DIAGNOSIS — I5032 Chronic diastolic (congestive) heart failure: Secondary | ICD-10-CM

## 2013-08-07 DIAGNOSIS — R079 Chest pain, unspecified: Secondary | ICD-10-CM

## 2013-08-07 DIAGNOSIS — E785 Hyperlipidemia, unspecified: Secondary | ICD-10-CM

## 2013-08-07 DIAGNOSIS — I10 Essential (primary) hypertension: Secondary | ICD-10-CM | POA: Insufficient documentation

## 2013-08-07 DIAGNOSIS — E782 Mixed hyperlipidemia: Secondary | ICD-10-CM

## 2013-08-07 DIAGNOSIS — I509 Heart failure, unspecified: Secondary | ICD-10-CM | POA: Insufficient documentation

## 2013-08-07 DIAGNOSIS — Z79899 Other long term (current) drug therapy: Secondary | ICD-10-CM | POA: Insufficient documentation

## 2013-08-07 DIAGNOSIS — I5030 Unspecified diastolic (congestive) heart failure: Principal | ICD-10-CM | POA: Insufficient documentation

## 2013-08-07 DIAGNOSIS — I5189 Other ill-defined heart diseases: Secondary | ICD-10-CM | POA: Diagnosis present

## 2013-08-07 DIAGNOSIS — G4733 Obstructive sleep apnea (adult) (pediatric): Secondary | ICD-10-CM

## 2013-08-07 HISTORY — DX: Essential (primary) hypertension: I10

## 2013-08-07 LAB — COMPREHENSIVE METABOLIC PANEL
ALT: 42 U/L (ref 0–53)
AST: 48 U/L — ABNORMAL HIGH (ref 0–37)
CO2: 20 mEq/L (ref 19–32)
Chloride: 96 mEq/L (ref 96–112)
Creatinine, Ser: 1.13 mg/dL (ref 0.50–1.35)
GFR calc Af Amer: 79 mL/min — ABNORMAL LOW (ref >90)
GFR calc non Af Amer: 68 mL/min — ABNORMAL LOW (ref >90)
Glucose, Bld: 109 mg/dL — ABNORMAL HIGH (ref 70–99)
Total Bilirubin: 0.7 mg/dL (ref 0.3–1.2)

## 2013-08-07 LAB — CBC WITH DIFFERENTIAL/PLATELET
Basophils Absolute: 0 10*3/uL (ref 0.0–0.1)
Eosinophils Relative: 1 % (ref 0–5)
HCT: 50.3 % (ref 39.0–52.0)
Hemoglobin: 18.1 g/dL — ABNORMAL HIGH (ref 13.0–17.0)
Lymphocytes Relative: 23 % (ref 12–46)
Lymphs Abs: 1.7 10*3/uL (ref 0.7–4.0)
MCV: 90.8 fL (ref 78.0–100.0)
Monocytes Absolute: 0.9 10*3/uL (ref 0.1–1.0)
Neutro Abs: 4.7 10*3/uL (ref 1.7–7.7)
RBC: 5.54 MIL/uL (ref 4.22–5.81)
RDW: 13.3 % (ref 11.5–15.5)
WBC: 7.4 10*3/uL (ref 4.0–10.5)

## 2013-08-07 LAB — TROPONIN I: Troponin I: <0.3 ng/mL (ref <0.30)

## 2013-08-07 LAB — PRO B NATRIURETIC PEPTIDE: Pro B Natriuretic peptide (BNP): 18.6 pg/mL (ref 0–125)

## 2013-08-07 MED ORDER — IOHEXOL 350 MG/ML SOLN
100.0000 mL | Freq: Once | INTRAVENOUS | Status: AC | PRN
  Administered 2013-08-07: 100 mL via INTRAVENOUS

## 2013-08-07 MED ORDER — FISH OIL 1000 MG PO CAPS: 1000.0000 mg | ORAL_CAPSULE | Freq: Two times a day (BID) | ORAL | Status: DC

## 2013-08-07 MED ORDER — ASPIRIN EC 325 MG PO TBEC
325.0000 mg | DELAYED_RELEASE_TABLET | Freq: Every day | ORAL | Status: DC
  Administered 2013-08-08: 325 mg via ORAL
  Filled 2013-08-07: qty 1

## 2013-08-07 MED ORDER — LISINOPRIL 20 MG PO TABS
20.0000 mg | ORAL_TABLET | Freq: Every day | ORAL | Status: DC
  Administered 2013-08-08: 20 mg via ORAL
  Filled 2013-08-07: qty 1

## 2013-08-07 MED ORDER — PANTOPRAZOLE SODIUM 40 MG PO TBEC
40.0000 mg | DELAYED_RELEASE_TABLET | Freq: Every day | ORAL | Status: DC
  Administered 2013-08-08: 40 mg via ORAL
  Filled 2013-08-07: qty 1

## 2013-08-07 MED ORDER — ASPIRIN 81 MG PO CHEW
253.0000 mg | CHEWABLE_TABLET | Freq: Once | ORAL | Status: AC
Start: 2013-08-07 — End: 2013-08-07
  Administered 2013-08-07: 243 mg via ORAL
  Filled 2013-08-07: qty 3

## 2013-08-07 MED ORDER — MORPHINE SULFATE 2 MG/ML IJ SOLN: 2.0000 mg | INTRAMUSCULAR | Status: DC | PRN

## 2013-08-07 MED ORDER — ONDANSETRON HCL 4 MG/2ML IJ SOLN: 4.0000 mg | Freq: Four times a day (QID) | INTRAMUSCULAR | Status: DC | PRN

## 2013-08-07 MED ORDER — ESCITALOPRAM OXALATE 20 MG PO TABS
20.0000 mg | ORAL_TABLET | Freq: Every day | ORAL | Status: DC
  Administered 2013-08-08: 20 mg via ORAL
  Filled 2013-08-07: qty 1

## 2013-08-07 MED ORDER — OMEGA-3-ACID ETHYL ESTERS 1 G PO CAPS
1.0000 g | ORAL_CAPSULE | Freq: Two times a day (BID) | ORAL | Status: DC
  Administered 2013-08-07 – 2013-08-08 (×2): 1 g via ORAL
  Filled 2013-08-07 (×3): qty 1

## 2013-08-07 MED ORDER — ATORVASTATIN CALCIUM 20 MG PO TABS
20.0000 mg | ORAL_TABLET | Freq: Every day | ORAL | Status: DC
  Administered 2013-08-07: 20 mg via ORAL
  Filled 2013-08-07 (×2): qty 1

## 2013-08-07 MED ORDER — ENOXAPARIN SODIUM 40 MG/0.4ML ~~LOC~~ SOLN
40.0000 mg | SUBCUTANEOUS | Status: DC
  Administered 2013-08-07: 40 mg via SUBCUTANEOUS
  Filled 2013-08-07 (×2): qty 0.4

## 2013-08-07 MED ORDER — LATANOPROST 0.005 % OP SOLN
1.0000 [drp] | Freq: Every day | OPHTHALMIC | Status: DC
  Administered 2013-08-08: 1 [drp] via OPHTHALMIC
  Filled 2013-08-07: qty 2.5

## 2013-08-07 MED ORDER — ACETAMINOPHEN 325 MG PO TABS: 650.0000 mg | ORAL_TABLET | ORAL | Status: DC | PRN

## 2013-08-07 MED ORDER — GI COCKTAIL ~~LOC~~: 30.0000 mL | Freq: Four times a day (QID) | ORAL | Status: DC | PRN

## 2013-08-07 MED ORDER — NITROGLYCERIN 0.4 MG SL SUBL: 0.4000 mg | SUBLINGUAL_TABLET | SUBLINGUAL | Status: DC | PRN

## 2013-08-07 NOTE — Telephone Encounter (Signed)
Patient is wanting additional refills on his Lipitor Rx. Advised him that per last refill on 07/31/13 he is due for fasting labs. Patient says he would like to go to the lab at [facility] to have labs done so that he can have his additional refills on his Lipitor. Last seen 10/18/2012. Needs orders. Please advise.

## 2013-08-07 NOTE — ED Notes (Signed)
Pt c/o dizziness and generalized weakness with left shoulder pain; pt sts dizziness started on Sunday but became more severe today; pt flushed in appearance

## 2013-08-07 NOTE — ED Provider Notes (Signed)
CSN: [id number]     Arrival date & time 08/07/13  1448 History   First MD Initiated Contact with Patient 08/07/13 1504     Chief Complaint  Patient presents with  . Shoulder Pain  . Weakness  . Dizziness   (Consider location/radiation/quality/duration/timing/severity/associated sxs/prior Treatment) HPI Comments: Presents to the ER for evaluation of left shoulder pain and chest discomfort. Symptoms began this afternoon. Patient denies any injury to the area. The pain does not radiate into the arm, is centered around the shoulder blade area. He describes the chest discomfort as a tightness. He feels slightly short of breath. Patient reports that he works out several times a week and has never had exertional chest pain. He is hypertensive and has a history of controlled hyperlipidemia. Is not a smoker and denies family history of coronary artery disease.  Patient reports feeling very washed out and fatigued. This has been ongoing for 2 days.  Patient is a 62 y.o. male presenting with shoulder pain and weakness.  Shoulder Pain Associated symptoms include chest pain and shortness of breath.  Weakness Associated symptoms include chest pain and shortness of breath.    Past Medical History  Diagnosis Date  . Hyperlipidemia   . Hyperglycemia 2001    w/ normal A1c   . Liver enzyme elevation 2004  . Gilbert syndrome   . Colonic polyp     adenomatous, PMH of Dr Marina Goodell  . Diverticulosis   . Urethral stricture   . Obstructive sleep apnea (adult) (pediatric)   . Pure hypercholesterolemia   . Other symptoms involving respiratory system and chest   . Insomnia with sleep apnea, unspecified   . Testosterone deficiency   . High cholesterol     controlled  . Anxiety   . Obesity   . Cardiac abnormality   . Carpal tunnel syndrome   . DDD (degenerative disc disease), cervical   . GERD (gastroesophageal reflux disease)   . Sleep apnea, obstructive 02/20/2013    CPAP since 12-2012 . 9 cm water ,  [facility] .   Past Surgical History  Procedure Laterality Date  . Polypectomy  2007 & 2012    X2 ; Dr Marina Goodell  . Cystoscopy      X2  . Urethral dilation      secondary to trauma; Dr Wanda Plump  . Tonsillectomy    . Vasectomy    . Cholecystectomy  11/07  . Varicose vein surgery  2002  . Facial fracture surgery  1987    blow out fracture & occipital fracture from basketball injuly   . Cataract extraction, bilateral  2&01/2012  . Obital optic      left eye: 1986   Family History  Problem Relation Age of Onset  . Heart attack Father 64  . Stroke Father 61  . Heart attack Paternal Grandfather     in 70s  . Prostate cancer Paternal Uncle   . Heart attack Paternal Uncle 46  . Colon cancer Neg Hx   . Heart attack Maternal Uncle 80  . Heart attack Mother    History  Substance Use Topics  . Smoking status: Never Smoker   . Smokeless tobacco: Never Used  . Alcohol Use: 6 - 7.5 oz/week    12-15 drink(s) per week     Comment: 12-15 beverages weekly    Review of Systems  Constitutional: Positive for fatigue.  Respiratory: Positive for chest tightness and shortness of breath.   Cardiovascular: Positive for chest pain.  Musculoskeletal: Positive  for back pain.  Neurological: Positive for weakness.  All other systems reviewed and are negative.    Allergies  Pollen extract  Home Medications   Current Outpatient Rx  Name  Route  Sig  Dispense  Refill  . AMBULATORY NON FORMULARY MEDICATION      CPAP Machine         . aspirin 81 MG tablet   Oral   Take 81 mg by mouth daily.           Marland Kitchen atorvastatin (LIPITOR) 20 MG tablet      TAKE ONE TABLET EACH DAY   30 tablet   0   . azithromycin (ZITHROMAX) 1 G powder   Oral   Take 1 packet by mouth once.         . escitalopram (LEXAPRO) 10 MG tablet   Oral   Take 10 mg by mouth daily.           Marland Kitchen lisinopril (PRINIVIL,ZESTRIL) 20 MG tablet      TAKE ONE TABLET BY MOUTH ONCE DAILY   90 tablet   3   . Multiple  Vitamins-Minerals (PROTEGRA PO)   Oral   Take by mouth.           Marland Kitchen NEXIUM 40 MG capsule   Oral   Take 1 capsule (40 mg total) by mouth daily before breakfast.   30 each   2     Dispense as written.   . Omega-3 Fatty Acids (FISH OIL) 1000 MG CAPS   Oral   Take by mouth 2 (two) times daily.           . predniSONE (STERAPRED UNI-PAK) 5 MG TABS   Oral   Take by mouth. Sinus infection         . testosterone cypionate (DEPOTESTOTERONE CYPIONATE) 100 MG/ML injection   Intramuscular   Inject 300 mg into the muscle every 14 (fourteen) days. For IM use only          BP 146/92  Pulse 78  Temp(Src) 98.3 F (36.8 C) (Oral)  Resp 18  SpO2 97% Physical Exam  Constitutional: He is oriented to person, place, and time. He appears well-developed and well-nourished. No distress.  HENT:  Head: Normocephalic and atraumatic.  Right Ear: Hearing normal.  Left Ear: Hearing normal.  Nose: Nose normal.  Mouth/Throat: Oropharynx is clear and moist and mucous membranes are normal.  Eyes: Conjunctivae and EOM are normal. Pupils are equal, round, and reactive to light.  Neck: Normal range of motion. Neck supple.  Cardiovascular: Regular rhythm, S1 normal and S2 normal.  Exam reveals no gallop and no friction rub.   No murmur heard. Pulmonary/Chest: Effort normal and breath sounds normal. No respiratory distress. He exhibits no tenderness.  Abdominal: Soft. Normal appearance and bowel sounds are normal. There is no hepatosplenomegaly. There is no tenderness. There is no rebound, no guarding, no tenderness at McBurney's point and negative Murphy's sign. No hernia.  Musculoskeletal: Normal range of motion.  Neurological: He is alert and oriented to person, place, and time. He has normal strength. No cranial nerve deficit or sensory deficit. Coordination normal. GCS eye subscore is 4. GCS verbal subscore is 5. GCS motor subscore is 6.  Skin: Skin is warm, dry and intact. No rash noted. No  cyanosis.  Psychiatric: He has a normal mood and affect. His speech is normal and behavior is normal. Thought content normal.    ED Course  Procedures (including critical care  time)  EKG:  Date: 08/07/2013  Rate: 82  Rhythm: normal sinus rhythm  QRS Axis: left  Intervals: normal  ST/T Wave abnormalities: nonspecific ST/T changes  Conduction Disutrbances:none  Narrative Interpretation:   Old EKG Reviewed: unchanged   Labs Review Labs Reviewed  CBC WITH DIFFERENTIAL - Abnormal; Notable for the following:    Hemoglobin 18.1 (*)    All other components within normal limits  COMPREHENSIVE METABOLIC PANEL - Abnormal; Notable for the following:    Sodium 133 (*)    Glucose, Bld 109 (*)    AST 48 (*)    GFR calc non Af Amer 68 (*)    GFR calc Af Amer 79 (*)    All other components within normal limits  TROPONIN I  PRO B NATRIURETIC PEPTIDE  TSH  TROPONIN I  TROPONIN I  TROPONIN I   Imaging Review No results found.  MDM  Diagnosis: Chest pain  Patient presents to the year for evaluation of chest pain. His EKG showed nonspecific ST/T-wave changes, but report was negative. Patient improved without intervention here in the ER. Based on his cardiac risk factors, however, he was admitted to the hospital for further workup.    Gilda Crease, MD 08/10/13 [phone]

## 2013-08-07 NOTE — Telephone Encounter (Signed)
Future orders placed and pt notified.

## 2013-08-08 DIAGNOSIS — R079 Chest pain, unspecified: Secondary | ICD-10-CM

## 2013-08-08 DIAGNOSIS — I509 Heart failure, unspecified: Secondary | ICD-10-CM

## 2013-08-08 DIAGNOSIS — I5032 Chronic diastolic (congestive) heart failure: Secondary | ICD-10-CM

## 2013-08-08 DIAGNOSIS — I517 Cardiomegaly: Secondary | ICD-10-CM

## 2013-08-08 LAB — TROPONIN I: Troponin I: <0.3 ng/mL (ref <0.30)

## 2013-08-08 LAB — TSH: TSH: 2.918 u[IU]/mL (ref 0.350–4.500)

## 2013-08-08 MED ORDER — ATORVASTATIN CALCIUM 20 MG PO TABS: ORAL_TABLET | ORAL | Status: DC

## 2013-08-08 NOTE — Progress Notes (Signed)
Utilization review completed.  

## 2013-08-08 NOTE — Progress Notes (Addendum)
Echocardiogram 2D Echocardiogram has been performed.  Brian Cortez 08/08/2013, 12:03 PM

## 2013-08-08 NOTE — Discharge Summary (Signed)
Physician Discharge Summary  Brian Cortez OZH:[id number] DOB: [birth year] DOA: 08/07/2013  PCP: Brian Melnick, MD  Admit date: 08/07/2013 Discharge date: 08/08/2013  Time spent: 30 minutes  Recommendations for Outpatient Follow-up:  1. F/u with pcp in 1-2 weeks 2. Would follow up with Cardiology as an outpatient for f/u on diastolic heart failure  Discharge Diagnoses:  Diastolic CHF Principal Problem:   Chest pain Active Problems:   HYPERLIPIDEMIA   HYPERTENSION, ESSENTIAL NOS   Sleep apnea, obstructive  Discharge Condition: Stable  Diet recommendation: Heart Healthy  Filed Weights   08/07/13 2206  Weight: 122.607 kg (270 lb 4.8 oz)    History of present illness:  Brian Cortez is a 62 y.o. male history of OSA, hypertension, hyperlipidemia started experiencing chest pain around 10 AM today which was retrosternal radiating to the back present even at rest. Since the pain was persistent patient came to the ER. EKG cardiac markers have been unremarkable and since pain is radiating to the back a CAT scan of the chest was done which shows mild edema and coronary artery calcifications. Patient has been admitted for further management. By the time patient reached ER patient's chest pain had resolved. Patient states that over the last 3 days he has been having some fatigue. The fatigue started after he was in the church on Sunday morning which lasted through the whole day but on Monday he got better but again the fatigue started on Tuesday. Patient denies any difficulty breathing or any productive cough fever chills nausea vomiting diarrhea.   Hospital Course:  The patient was admitted to the floor. Serial cardiac enzymes were negative x 3. The patient underwent a 2d echo on 9/17 which demonstrated mild grade 2 diastolic chf. The patient otherwise remained medically stable for close outpatient follow up.  Procedures: 2D echo on 08/08/13  Discharge Exam: Filed Vitals:   08/08/13  0400 08/08/13 0738 08/08/13 0959 08/08/13 1143  BP: 142/95 123/87 140/94 145/91  Pulse: 59 60  65  Temp: 97.9 F (36.6 C) 97.8 F (36.6 C)  97.6 F (36.4 C)  TempSrc: Oral Oral  Oral  Resp: 18 18  20   Height:      Weight:      SpO2: 97% 97%  99%    General: Awake, in nad Cardiovascular: regular, s1, s2 Respiratory: normal resp effort, no wheezing  Discharge Instructions       Future Appointments Provider Department Dept Phone   08/23/2013 3:00 PM Brian Novas, MD [facility] [phone]   10/22/2013 8:30 AM Brian Lawless, MD [facility]  [place] [phone]       Medication List         ALAVERT PO  Take 1 tablet by mouth daily.     AMBULATORY NON FORMULARY MEDICATION  CPAP Machine     aspirin 81 MG tablet  Take 81 mg by mouth daily.     atorvastatin 20 MG tablet  Commonly known as:  LIPITOR  TAKE ONE TABLET EACH DAY     atorvastatin 20 MG tablet  Commonly known as:  LIPITOR  Take 20 mg by mouth at bedtime.     CENTRUM SILVER PO  Take 1 tablet by mouth daily.     escitalopram 20 MG tablet  Commonly known as:  LEXAPRO  Take 20 mg by mouth daily.     esomeprazole 20 MG capsule  Commonly known as:  NEXIUM  Take 20 mg by mouth at bedtime.  Fish Oil 1000 MG Caps  Take 1,000 mg by mouth 2 (two) times daily.     latanoprost 0.005 % ophthalmic solution  Commonly known as:  XALATAN  Place 1 drop into both eyes daily.     lisinopril 20 MG tablet  Commonly known as:  PRINIVIL,ZESTRIL  Take 20 mg by mouth daily.     testosterone cypionate 100 MG/ML injection  Commonly known as:  DEPOTESTOTERONE CYPIONATE  Inject 300 mg into the muscle every 14 (fourteen) days. For IM use only       Allergies  Allergen Reactions  . Pollen Extract Cough    "sinus issues"   Follow-up Information   Follow up with Brian Melnick, MD. Schedule an appointment as soon as possible for a visit in 1 week.   Specialty:   Internal Medicine   Contact information:   [phone] [address] [facility] [address] [place] [address] [phone]        The results of significant diagnostics from this hospitalization (including imaging, microbiology, ancillary and laboratory) are listed below for reference.    Significant Diagnostic Studies: Ct Angio Chest Pe W/cm &/or Wo Cm  08/07/2013   CLINICAL DATA:  Fatigue. Lightheaded. Chest pressure. Left shoulder pain.  EXAM: CT ANGIOGRAPHY CHEST WITH CONTRAST  TECHNIQUE: Multidetector CT imaging of the chest was performed using the standard protocol during bolus administration of intravenous contrast. Multiplanar CT image reconstructions including MIPs were obtained to evaluate the vascular anatomy.  CONTRAST:  OMNIPAQUE IOHEXOL 350 MG/ML SOLN  COMPARISON:  No priors.  FINDINGS: Mediastinum: No filling defects within the pulmonary arterial tree to suggest underlying pulmonary embolism. Heart size is normal. There is no significant pericardial fluid, thickening or pericardial calcification. There is atherosclerosis of the thoracic aorta, the great vessels of the mediastinum and the coronary arteries, including calcified atherosclerotic plaque in the left anterior descending, left circumflex and right coronary arteries. No pathologically enlarged mediastinal or hilar lymph nodes. Esophagus is unremarkable in appearance.  Lungs/Pleura: Mild diffuse ground-glass attenuation throughout the lungs bilaterally with multifocal interlobular septal thickening, favored to represent a background of mild interstitial pulmonary edema. No consolidative airspace disease. No pleural effusions. No pneumothorax. No suspicious appearing pulmonary nodules or masses.  Upper Abdomen: Unremarkable.  Musculoskeletal: There are no aggressive appearing lytic or blastic lesions noted in the visualized portions of the skeleton.  Review of the MIP images confirms the above findings.  IMPRESSION: 1. No  evidence of pulmonary embolism. 2. The appearance of the lungs suggests mild interstitial pulmonary edema. 3. Atherosclerosis, including left main and 3 vessel coronary artery disease. Please note that although the presence of coronary artery calcium documents the presence of coronary artery disease, the severity of this disease and any potential stenosis cannot be assessed on this non-gated CT examination. Assessment for potential risk factor modification, dietary therapy or pharmacologic therapy may be warranted, if clinically indicated.   Electronically Signed   By: Trudie Reed M.D.   On: 08/07/2013 20:14   Dg Chest Port 1 View  08/07/2013   *RADIOLOGY REPORT*  Clinical Data: Chest pain and pressure  PORTABLE CHEST - 1 VIEW  Comparison: Chest x-ray of 12/18/2010  Findings: The lungs are not optimally aerated.  However no focal infiltrate or effusion is seen.  Mediastinal contours are stable. The heart is within upper limits of normal.  No bony abnormality is noted.  IMPRESSION: Suboptimal inspiration.  No active lung disease.   Original Report Authenticated By: Dwyane Dee,  M.D.    Microbiology: No results found for this or any previous visit (from the past 240 hour(s)).   Labs: Basic Metabolic Panel:  Recent Labs Lab 08/07/13 1558  NA 133*  K 4.1  CL 96  CO2 20  GLUCOSE 109*  BUN 20  CREATININE 1.13  CALCIUM 9.4   Liver Function Tests:  Recent Labs Lab 08/07/13 1558  AST 48*  ALT 42  ALKPHOS 50  BILITOT 0.7  PROT 7.1  ALBUMIN 4.0   No results found for this basename: LIPASE, AMYLASE,  in the last 168 hours No results found for this basename: AMMONIA,  in the last 168 hours CBC:  Recent Labs Lab 08/07/13 1558  WBC 7.4  NEUTROABS 4.7  HGB 18.1*  HCT 50.3  MCV 90.8  PLT 199   Cardiac Enzymes:  Recent Labs Lab 08/07/13 1558 08/07/13 2241 08/08/13 0400 08/08/13 1108  TROPONINI <0.30 <0.30 <0.30 <0.30   BNP: BNP (last 3 results)  Recent Labs   08/07/13 1558  PROBNP 18.6   CBG: No results found for this basename: GLUCAP,  in the last 168 hours   Signed:  Maanasa Aderhold K  [facility] 08/08/2013, 6:02 PM

## 2013-08-08 NOTE — H&P (Addendum)
[facility] History and Physical  Brian Cortez ZOX:[id number] DOB: 05-Aug-1951 DOA: 08/07/2013  Referring physician: ER physician. PCP: [name], MD   Chief Complaint: Chest pain.  HPI: Brian Cortez is a 62 y.o. male history of OSA, hypertension, hyperlipidemia started experiencing chest pain around 10 AM today which was retrosternal radiating to the back present even at rest. Since the pain was persistent patient came to the ER. EKG cardiac markers have been unremarkable and since pain is radiating to the back a CAT scan of the chest was done which shows mild edema and coronary artery calcifications. Patient has been admitted for further management. By the time patient reached ER patient's chest pain had resolved. Patient states that over the last 3 days he has been having some fatigue. The fatigue started after he was in the church on Sunday morning which lasted through the whole day but on Monday he got better but again the fatigue started on Tuesday. Patient denies any difficulty breathing or any productive cough fever chills nausea vomiting diarrhea.   Review of Systems: As presented in the history of presenting illness, rest negative.  Past Medical History  Diagnosis Date  . Hyperlipidemia   . Hyperglycemia 2001    w/ normal A1c   . Liver enzyme elevation 2004  . Gilbert syndrome   . Colonic polyp     adenomatous, PMH of Dr Marina Goodell  . Diverticulosis   . Urethral stricture   . Obstructive sleep apnea (adult) (pediatric)   . Pure hypercholesterolemia   . Other symptoms involving respiratory system and chest   . Insomnia with sleep apnea, unspecified   . Testosterone deficiency   . High cholesterol     controlled  . Anxiety   . Obesity   . Cardiac abnormality   . Carpal tunnel syndrome   . DDD (degenerative disc disease), cervical   . GERD (gastroesophageal reflux disease)   . Sleep apnea, obstructive 02/20/2013    CPAP since 12-2012 . 9 cm water , respicare  .  Marland Kitchen Hypertension    Past Surgical History  Procedure Laterality Date  . Polypectomy  2007 & 2012    X2 ; Dr Marina Goodell  . Cystoscopy      X2  . Urethral dilation      secondary to trauma; Dr Wanda Plump  . Tonsillectomy    . Vasectomy    . Cholecystectomy  11/07  . Varicose vein surgery  2002  . Facial fracture surgery  1987    blow out fracture & occipital fracture from basketball injuly   . Cataract extraction, bilateral  2&01/2012  . Obital optic      left eye: 1986   Social History:  reports that he has never smoked. He has never used smokeless tobacco. He reports that he drinks about 6.0 ounces of alcohol per week. He reports that he does not use illicit drugs.  home. where does patient live--  can do ADLs. Can patient participate in ADLs?  Allergies  Allergen Reactions  . Pollen Extract Cough    "sinus issues"    Family History  Problem Relation Age of Onset  . Heart attack Father 85  . Stroke Father 74  . Heart attack Paternal Grandfather     in 67s  . Prostate cancer Paternal Uncle   . Heart attack Paternal Uncle 81  . Colon cancer Neg Hx   . Heart attack Maternal Uncle 80  . Heart attack Mother  Prior to Admission medications   Medication Sig Start Date End Date Taking? Authorizing Provider  aspirin 81 MG tablet Take 81 mg by mouth daily.     Yes Historical Provider, MD  atorvastatin (LIPITOR) 20 MG tablet Take 20 mg by mouth at bedtime.   Yes Historical Provider, MD  escitalopram (LEXAPRO) 20 MG tablet Take 20 mg by mouth daily.   Yes Historical Provider, MD  esomeprazole (NEXIUM) 20 MG capsule Take 20 mg by mouth at bedtime.   Yes Historical Provider, MD  latanoprost (XALATAN) 0.005 % ophthalmic solution Place 1 drop into both eyes daily.   Yes Historical Provider, MD  lisinopril (PRINIVIL,ZESTRIL) 20 MG tablet Take 20 mg by mouth daily.   Yes Historical Provider, MD  Loratadine (ALAVERT PO) Take 1 tablet by mouth daily.   Yes Historical Provider, MD   Multiple Vitamins-Minerals (CENTRUM SILVER PO) Take 1 tablet by mouth daily.   Yes Historical Provider, MD  Omega-3 Fatty Acids (FISH OIL) 1000 MG CAPS Take 1,000 mg by mouth 2 (two) times daily.   Yes Historical Provider, MD  testosterone cypionate (DEPOTESTOTERONE CYPIONATE) 100 MG/ML injection Inject 300 mg into the muscle every 14 (fourteen) days. For IM use only   Yes Historical Provider, MD  AMBULATORY NON FORMULARY MEDICATION CPAP Machine    Historical Provider, MD   Physical Exam: Filed Vitals:   08/07/13 2100 08/07/13 2145 08/07/13 2206 08/08/13 0000  BP: 136/89 141/85 148/92 148/90  Pulse: 67 64 67 72  Temp:   98.1 F (36.7 C) 98.3 F (36.8 C)  TempSrc:   Oral Oral  Resp: 20 13 14 20   Height:   6\' 4"  (1.93 m)   Weight:   122.607 kg (270 lb 4.8 oz)   SpO2: 96% 95% 95% 96%     General:   well-developed well-nourished.  Eyes:  anicteric no pallor.  ENT:  no discharge from ears eyes nose mouth.  Neck:  no mass felt.  Cardiovascular:  S1-S2 heard.  Respiratory:  no rhonchi or crepitations.  Abdomen:  soft nontender bowel sounds present.  Skin:  no rash.  Musculoskeletal:  no edema.  Psychiatric:  appears normal.  Neurologic:  [name] oriented to time place and person. Moves all extremities.  Labs on Admission:  Basic Metabolic Panel:  Recent Labs Lab 08/07/13 1558  NA 133*  K 4.1  CL 96  CO2 20  GLUCOSE 109*  BUN 20  CREATININE 1.13  CALCIUM 9.4   Liver Function Tests:  Recent Labs Lab 08/07/13 1558  AST 48*  ALT 42  ALKPHOS 50  BILITOT 0.7  PROT 7.1  ALBUMIN 4.0   No results found for this basename: LIPASE, AMYLASE,  in the last 168 hours No results found for this basename: AMMONIA,  in the last 168 hours CBC:  Recent Labs Lab 08/07/13 1558  WBC 7.4  NEUTROABS 4.7  HGB 18.1*  HCT 50.3  MCV 90.8  PLT 199   Cardiac Enzymes:  Recent Labs Lab 08/07/13 1558 08/07/13 2241  TROPONINI <0.30 <0.30    BNP (last 3  results)  Recent Labs  08/07/13 1558  PROBNP 18.6   CBG: No results found for this basename: GLUCAP,  in the last 168 hours  Radiological Exams on Admission: Ct Angio Chest Pe W/cm &/or Wo Cm  08/07/2013   CLINICAL DATA:  Fatigue. Lightheaded. Chest pressure. Left shoulder pain.  EXAM: CT ANGIOGRAPHY CHEST WITH CONTRAST  TECHNIQUE: Multidetector CT imaging of the chest was performed using  the standard protocol during bolus administration of intravenous contrast. Multiplanar CT image reconstructions including MIPs were obtained to evaluate the vascular anatomy.  CONTRAST:  OMNIPAQUE IOHEXOL 350 MG/ML SOLN  COMPARISON:  No priors.  FINDINGS: Mediastinum: No filling defects within the pulmonary arterial tree to suggest underlying pulmonary embolism. Heart size is normal. There is no significant pericardial fluid, thickening or pericardial calcification. There is atherosclerosis of the thoracic aorta, the great vessels of the mediastinum and the coronary arteries, including calcified atherosclerotic plaque in the left anterior descending, left circumflex and right coronary arteries. No pathologically enlarged mediastinal or hilar lymph nodes. Esophagus is unremarkable in appearance.  Lungs/Pleura: Mild diffuse ground-glass attenuation throughout the lungs bilaterally with multifocal interlobular septal thickening, favored to represent a background of mild interstitial pulmonary edema. No consolidative airspace disease. No pleural effusions. No pneumothorax. No suspicious appearing pulmonary nodules or masses.  Upper Abdomen: Unremarkable.  Musculoskeletal: There are no aggressive appearing lytic or blastic lesions noted in the visualized portions of the skeleton.  Review of the MIP images confirms the above findings.  IMPRESSION: 1. No evidence of pulmonary embolism. 2. The appearance of the lungs suggests mild interstitial pulmonary edema. 3. Atherosclerosis, including left main and 3 vessel coronary  artery disease. Please note that although the presence of coronary artery calcium documents the presence of coronary artery disease, the severity of this disease and any potential stenosis cannot be assessed on this non-gated CT examination. Assessment for potential risk factor modification, dietary therapy or pharmacologic therapy may be warranted, if clinically indicated.   Electronically Signed   By: Trudie Reed M.D.   On: 08/07/2013 20:14   Dg Chest Port 1 View  08/07/2013   *RADIOLOGY REPORT*  Clinical Data: Chest pain and pressure  PORTABLE CHEST - 1 VIEW  Comparison: Chest x-ray of 12/18/2010  Findings: The lungs are not optimally aerated.  However no focal infiltrate or effusion is seen.  Mediastinal contours are stable. The heart is within upper limits of normal.  No bony abnormality is noted.  IMPRESSION: Suboptimal inspiration.  No active lung disease.   Original Report Authenticated By: Dwyane Dee, M.D.    EKG: Independently reviewed.  normal sinus rhythm with complete right bundle branch block.  Assessment/Plan Principal Problem:   Chest pain Active Problems:   HYPERLIPIDEMIA   HYPERTENSION, ESSENTIAL NOS   Sleep apnea, obstructive   1. Chest pain - as the chest pain-free. Cycle cardiac markers. Check BNP and 2-D echo. Since patient's CT scan shows coronary artery calcifications may consult cardiology in a.m. 2. Hyperlipidemia - continue home medications. 3. Hypertension - continue home medications. 4. OSA - CPAP per respiratory.    Code Status:  full code.  Family Communication:  patient's wife at the bedside.  Disposition Plan:  admit for observation.    Mathilde Mcwherter N. [facility] Pager [phone].  If 7PM-7AM, please contact night-coverage [email or web address] Password [facility] 08/08/2013, 2:57 AM

## 2013-08-08 NOTE — Progress Notes (Signed)
D/c orders received, IVs removed with gauze on, pt remains in stable condition, pt meds and instructions reviewed and given to pt; pt d/c to home

## 2013-08-10 ENCOUNTER — Ambulatory Visit (INDEPENDENT_AMBULATORY_CARE_PROVIDER_SITE_OTHER): Payer: 59 | Admitting: Internal Medicine

## 2013-08-10 ENCOUNTER — Encounter: Payer: Self-pay | Admitting: Internal Medicine

## 2013-08-10 VITALS — BP 134/76 | HR 73 | Temp 98.5°F | Resp 12 | Wt 271.0 lb

## 2013-08-10 DIAGNOSIS — R9431 Abnormal electrocardiogram [ECG] [EKG]: Secondary | ICD-10-CM

## 2013-08-10 DIAGNOSIS — I10 Essential (primary) hypertension: Secondary | ICD-10-CM

## 2013-08-10 DIAGNOSIS — R079 Chest pain, unspecified: Secondary | ICD-10-CM

## 2013-08-10 DIAGNOSIS — E782 Mixed hyperlipidemia: Secondary | ICD-10-CM

## 2013-08-10 NOTE — Assessment & Plan Note (Signed)
Goals discussed 

## 2013-08-10 NOTE — Assessment & Plan Note (Signed)
Cardiology referral  

## 2013-08-10 NOTE — Assessment & Plan Note (Signed)
Fasting lipids 08/14/13

## 2013-08-10 NOTE — Patient Instructions (Addendum)
Take the EKG to any emergency room or preop visits. There are nonspecific changes; as long as there is no new change these are not clinically significant . If the old EKG is not available for comparison; it may result in unnecessary hospitalization for observation with significant unnecessary expense. 

## 2013-08-10 NOTE — Progress Notes (Signed)
  Subjective:    Patient ID: Brian Cortez, male    DOB: 08/26/1951, 62 y.o.   MRN: [id number]  HPI  Hospital records 9/16-9/17/14 reviewed. In summary he has grade 2 diastolic dysfunction associated with diffuse hypokinesis and slight  LV ejection fraction decrease. There was mild interstitial edema with a normal BNP. CT scan revealed coronary calcifications but this was a non-gaited study. Initially were significant fatigue 9/14. He denied any associated fever, chills, sweats, or flulike symptoms. He did have some lightheadedness.  He does has sleep apnea; there was no significant right ventricular issues.   Review of Systems   Since discharge he has had no chest pain, palpitations, dyspnea, claudication, edema, or paroxysmal nocturnal dyspnea.      Objective:   Physical Exam Appears healthy and well-nourished & in no acute distress  No carotid bruits are present.No neck pain distention present at 10 - 15 degrees. Thyroid normal to palpation  Heart rhythm and rate are normal with no significant murmurs or gallops.  Chest is clear with no increased work of breathing  There is no evidence of aortic aneurysm or renal artery bruits  Abdomen soft with no organomegaly or masses. No HJR  No clubbing, cyanosis or edema present.  Pedal pulses are intact   No ischemic skin changes are present .    Alert and oriented. Strength, tone normal          Assessment & Plan:  See Current Assessment & Plan in Problem List under specific Diagnosis

## 2013-08-10 NOTE — Assessment & Plan Note (Addendum)
To take the EKG to any emergency room or preop visits.

## 2013-08-13 ENCOUNTER — Encounter: Payer: Self-pay | Admitting: Internal Medicine

## 2013-08-13 DIAGNOSIS — R079 Chest pain, unspecified: Secondary | ICD-10-CM

## 2013-08-14 ENCOUNTER — Other Ambulatory Visit (INDEPENDENT_AMBULATORY_CARE_PROVIDER_SITE_OTHER): Payer: 59

## 2013-08-14 DIAGNOSIS — E785 Hyperlipidemia, unspecified: Secondary | ICD-10-CM

## 2013-08-14 LAB — HEPATIC FUNCTION PANEL
ALT: 30 U/L (ref 0–53)
AST: 37 U/L (ref 0–37)
Alkaline Phosphatase: 42 U/L (ref 39–117)
Bilirubin, Direct: 0.2 mg/dL (ref 0.0–0.3)
Total Bilirubin: 1.2 mg/dL (ref 0.3–1.2)

## 2013-08-15 ENCOUNTER — Encounter: Payer: Self-pay | Admitting: Internal Medicine

## 2013-08-16 ENCOUNTER — Ambulatory Visit: Payer: 59 | Admitting: Internal Medicine

## 2013-08-17 ENCOUNTER — Encounter: Payer: Self-pay | Admitting: Cardiology

## 2013-08-17 ENCOUNTER — Encounter: Payer: Self-pay | Admitting: Internal Medicine

## 2013-08-17 ENCOUNTER — Ambulatory Visit (INDEPENDENT_AMBULATORY_CARE_PROVIDER_SITE_OTHER): Payer: 59 | Admitting: Cardiology

## 2013-08-17 VITALS — BP 133/86 | HR 77 | Ht 75.0 in | Wt 270.0 lb

## 2013-08-17 DIAGNOSIS — R079 Chest pain, unspecified: Secondary | ICD-10-CM

## 2013-08-17 DIAGNOSIS — R002 Palpitations: Secondary | ICD-10-CM

## 2013-08-17 DIAGNOSIS — I429 Cardiomyopathy, unspecified: Secondary | ICD-10-CM

## 2013-08-17 DIAGNOSIS — I2510 Atherosclerotic heart disease of native coronary artery without angina pectoris: Secondary | ICD-10-CM

## 2013-08-17 DIAGNOSIS — I428 Other cardiomyopathies: Secondary | ICD-10-CM

## 2013-08-17 DIAGNOSIS — E785 Hyperlipidemia, unspecified: Secondary | ICD-10-CM

## 2013-08-17 DIAGNOSIS — R5381 Other malaise: Secondary | ICD-10-CM

## 2013-08-17 DIAGNOSIS — R5383 Other fatigue: Secondary | ICD-10-CM | POA: Insufficient documentation

## 2013-08-17 DIAGNOSIS — E782 Mixed hyperlipidemia: Secondary | ICD-10-CM

## 2013-08-17 MED ORDER — ATORVASTATIN CALCIUM 40 MG PO TABS: 40.0000 mg | ORAL_TABLET | Freq: Every day | ORAL | Status: DC

## 2013-08-17 MED ORDER — CARVEDILOL 3.125 MG PO TABS: 3.1250 mg | ORAL_TABLET | Freq: Two times a day (BID) | ORAL | Status: DC

## 2013-08-17 NOTE — Patient Instructions (Addendum)
Your physician recommends that you schedule a follow-up appointment in: 2 WEEKS WITH DR [facility]  INCREASE LIPITOR TO 40 MG ONCE DAILY  Your physician has requested that you have en exercise stress myoview. For further information please visit [email or web address] Please follow instruction sheet, as given.   Your physician has recommended that you wear an event monitor. Event monitors are medical devices that record the heart's electrical activity. Doctors most often [place] these monitors to diagnose arrhythmias. Arrhythmias are problems with the speed or rhythm of the heartbeat. The monitor is a small, portable device. You can wear one while you do your normal daily activities. This is usually used to diagnose what is causing palpitations/syncope (passing out).   START CARVEDILOL 3.125 MG ONE TABLET TWICE DAILY  Your physician recommends that you return for lab work in: 2 MONTHS = DO NOT EAT PRIOR TO LAB WORK

## 2013-08-17 NOTE — Progress Notes (Signed)
Patient ID: Brian Cortez, male   DOB: 1951-10-13, 62 y.o.   MRN: [id number] PCP: Dr. Alwyn Ren  62 yo with history of hyperlipidemia and HTN presents for cardiology evaluation after recent hospitalization.  As patient was walking out of church on Sunday almost 2 wks ago, he noted a profound, extreme fatigue like he "had run a marathon."  He was a little lightheaded.  He had a little chest pressure (very minimal).  Symptoms lasted for a couple of hours and slowly/gradually resolved.  He did not feel tachypalpitations.  On Tuesday, while sitting at his desk at work, the same sensation of extreme fatigue/tiredness came over him (mild chest pressure, mild lightheadedness).  This lasted several hours again and gradually dissipated. At this point, he went to the ER for evaluation and ended up admitted overnight.  ECG was nonspecific.  CTA chest showed no PE but did show probable mild pulmonary edema as well as coronary artery calcification.  BNP was normal, cardiac enzymes were normal, and no arrhythmias showed up on telemetry.  He did reasonably well after discharge until this Wednesday.  He was at a [employer] and again developed severe fatigue (like running "2 marathons").   This again gradually resolved.  He did an exercise class later that day with no problems.  Of note, throughout this, patient has not had any exertional dyspnea or chest pain.  He has golfed several times and has been to exercise classes. He has had no prior cardiac history.  ECG: NSR, left axis deviation, nonspecific inferior and lateral ST-T changes  Labs (9/14): K 4.6, creatinine 1.1, LDL 85, HDL 41, LFTs normal, troponin negative x 3, BNP normal  PMH: 1.  OSA on CPAP 2. Hyperlipidemia 3. Depression 4. GERD 5. HTN 6. Low testosterone 7. Colonic polyps 8. CAD: Coronary calcification on CT chest in 9/14.  9. Cardiomyopathy: Echo (9/14) with EF 45-50%, mild LVH, diffuse hypokinesis, moderate diastolic dysfunction, normal RV  size with mildly decreased systolic function.   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   FH: No history of MI.  Father had CVA (was a smoker).   ROS: All systems reviewed and negative except as per HPI.   Current Outpatient Prescriptions  Medication Sig Dispense Refill  . AMBULATORY NON FORMULARY MEDICATION CPAP Machine      . aspirin 81 MG tablet Take 81 mg by mouth daily.        Marland Kitchen atorvastatin (LIPITOR) 40 MG tablet Take 1 tablet (40 mg total) by mouth daily. TAKE ONE TABLET EACH DAY  30 tablet  12  . escitalopram (LEXAPRO) 20 MG tablet Take 20 mg by mouth daily.      Marland Kitchen esomeprazole (NEXIUM) 20 MG capsule Take 20 mg by mouth at bedtime.      Marland Kitchen latanoprost (XALATAN) 0.005 % ophthalmic solution Place 1 drop into both eyes daily.      Marland Kitchen lisinopril (PRINIVIL,ZESTRIL) 20 MG tablet Take 20 mg by mouth daily.      . Loratadine (ALAVERT PO) Take 1 tablet by mouth daily.      . Multiple Vitamins-Minerals (CENTRUM SILVER PO) Take 1 tablet by mouth daily.      . Omega-3 Fatty Acids (FISH OIL) 1000 MG CAPS Take 1,000 mg by mouth 2 (two) times daily.      Marland Kitchen testosterone cypionate (DEPOTESTOTERONE CYPIONATE) 100 MG/ML injection Inject 300 mg into the muscle every 14 (fourteen) days. For IM  use only      . carvedilol (COREG) 3.125 MG tablet Take 1 tablet (3.125 mg total) by mouth 2 (two) times daily.  60 tablet  12   No current facility-administered medications for this visit.    BP 133/86  Pulse 77  Ht 6\' 3"  (1.905 m)  Wt 122.471 kg (270 lb)  BMI 33.75 kg/m2 General: NAD Neck: Thick, no JVD, no thyromegaly or thyroid nodule.  Lungs: Clear to auscultation bilaterally with normal respiratory effort. CV: Nondisplaced PMI.  Heart regular S1/S2, no S3/S4, no murmur.  No peripheral edema.  No carotid bruit.  Normal pedal pulses.  Abdomen: Soft, nontender, no hepatosplenomegaly, no distention.  Skin: Intact without lesions or  rashes.  Neurologic: Alert and oriented x 3.  Psych: Normal affect. Extremities: No clubbing or cyanosis.  HEENT: Normal.   Assessment/Plan: 1. Episodes of extreme fatigue: These have occurred somewhat at random and have lasted for several hours.  He is quite symptomatic with them.  He has associated mild chest pressure.  He does not get chest pressure/pain with exertion.  Workup so far has shown mildly depressed LV and RV systolic function as well as coronary artery calcification.  BNP was normal in the hospital but he had mild pulmonary edema on his chest CT.  One possible explanation would be an arrhythmia with significant tachycardia.  Though he did not feel palpitations, a prominent part of his symptomatology has been feeling like he's "run a marathon," and a significant tachyarrhythmia could do this.  I am also concerned about the possibility of coronary disease with decreased LV systolic function (though the LV dysfunction did not appear regional).  He additionally has coronary artery calcification, so we know that he has some degree of CAD. - I will have him wear a 3-week event monitor to look for arrhythmias.  - I actually recommended that we do a cardiac cath due to the LV systolic dysfunction and the presence of coronary disease on his CT.  However, he would like to hold off on invasive evaluation initially. Therefore, I will arrange for ETT-Cardiolite.  If he continues to have the symptoms despite a normal Cardiolite and no arrhythmias by monitoring, I might suggest that he still do a cardiac cath to definitively exclude significant CAD as a cause of his symptoms.   2. CAD: Noted on CT chest.  Cannot tell how severe from this study.  Needs to continue ASA 81 and atorvastatin.  See discussion above regarding ETT-Cardiolite.  3. Cardiomyopathy: Mildly depressed LV and RV systolic function.  The mild RV dysfunction could presumably be due to OSA with some pulmonary HTN, but I cannot explain the  LV systolic dysfunction.  CAD is a possibility, see above for plan to evaluate this.  It is also possible that something such as heavy ETOH intake could account for systolic dysfunction.  He endorses 3 glasses of wine on 5 days out of the week.  This probably is not heavy enough ETOH intake to account for cardiomyopathy.  - Continue lisinopril - I will add a low dose of Coreg (3.125 mg bid) given depressed EF.  4. Hyperlipidemia: Patient has CAD by calcium in coronaries on CT. Would like to see LDL a bit lower, increase atorvastatin to 80 mg daily.   Marca Ancona 08/17/2013

## 2013-08-19 ENCOUNTER — Encounter: Payer: Self-pay | Admitting: Cardiology

## 2013-08-21 ENCOUNTER — Ambulatory Visit (HOSPITAL_COMMUNITY): Payer: 59 | Attending: Cardiology | Admitting: Radiology

## 2013-08-21 VITALS — BP 135/96 | Ht 75.0 in | Wt 269.0 lb

## 2013-08-21 DIAGNOSIS — R0789 Other chest pain: Secondary | ICD-10-CM | POA: Insufficient documentation

## 2013-08-21 DIAGNOSIS — R079 Chest pain, unspecified: Secondary | ICD-10-CM

## 2013-08-21 DIAGNOSIS — R61 Generalized hyperhidrosis: Secondary | ICD-10-CM | POA: Insufficient documentation

## 2013-08-21 DIAGNOSIS — R0602 Shortness of breath: Secondary | ICD-10-CM

## 2013-08-21 DIAGNOSIS — I10 Essential (primary) hypertension: Secondary | ICD-10-CM | POA: Insufficient documentation

## 2013-08-21 DIAGNOSIS — R5381 Other malaise: Secondary | ICD-10-CM | POA: Insufficient documentation

## 2013-08-21 DIAGNOSIS — E785 Hyperlipidemia, unspecified: Secondary | ICD-10-CM | POA: Insufficient documentation

## 2013-08-21 DIAGNOSIS — R002 Palpitations: Secondary | ICD-10-CM | POA: Insufficient documentation

## 2013-08-21 DIAGNOSIS — R42 Dizziness and giddiness: Secondary | ICD-10-CM | POA: Insufficient documentation

## 2013-08-21 MED ORDER — TECHNETIUM TC 99M SESTAMIBI GENERIC - CARDIOLITE
10.0000 | Freq: Once | INTRAVENOUS | Status: AC | PRN
  Administered 2013-08-21 (×2): 10 via INTRAVENOUS

## 2013-08-21 MED ORDER — TECHNETIUM TC 99M SESTAMIBI GENERIC - CARDIOLITE
30.0000 | Freq: Once | INTRAVENOUS | Status: AC | PRN
  Administered 2013-08-21: 30 via INTRAVENOUS

## 2013-08-21 NOTE — Progress Notes (Addendum)
  [facility], [place] SITE 3 NUCLEAR MED [address] [place], [place] [address] [phone]    Cardiology Nuclear Med Study  Brian Cortez is a 62 y.o. male     MRN : [id number]     DOB: 06-29-1951  Procedure Date: 08/21/2013  Nuclear Med Background Indication for Stress Test:  Evaluation for Ischemia and Post Hospital:07/28/13 with Chest Pain and (-) enzymes History:  '98 GXT;normal;'09 MPS: EF=61%,no ischemia;9/14 Echo: EF=45-50%,CT: (+) coronary calcification Cardiac Risk Factors: Hypertension and Lipids  Symptoms:  Chest Pain/Pressure (last date of chest discomfort 7-10 days ago), Diaphoresis, Fatigue, Light-Headedness and Palpitations   Nuclear Pre-Procedure  Caffeine/Decaff Intake:  None NPO After: 8:00pm   Lungs:  clear O2 Sat: 98% on room air. IV 0.9% NS with Angio Cath:  20g  IV Site: R Wrist  IV Started by:  Cathlyn Parsons, RN  Chest Size (in):  52 Cup Size: n/a  Height: 6\' 3"  (1.905 m)  Weight:  269 lb (122.018 kg)  BMI:  Body mass index is 33.62 kg/(m^2). Tech Comments:  No Coreg x 24 hrs    Nuclear Med Study 1 or 2 day study: 1 day  Stress Test Type:  Stress  Reading MD: Cassell Clement, MD  Order Authorizing Provider:  Fransico Meadow  Resting Radionuclide: Technetium 61m Sestamibi  Resting Radionuclide Dose: 11.0 mCi   Stress Radionuclide:  Technetium 64m Sestamibi  Stress Radionuclide Dose: 33.0 mCi           Stress Protocol Rest HR: 59 Stress HR: 136  Rest BP: 135/96 Stress BP: 132/63  Exercise Time (min): 8:29 METS: 10.10           Dose of Adenosine (mg):  n/a Dose of Lexiscan: n/a mg  Dose of Atropine (mg): n/a Dose of Dobutamine: n/a mcg/kg/min (at max HR)  Stress Test Technologist: Cathlyn Parsons, RN  Nuclear Technologist:  Domenic Polite, CNMT     Rest Procedure:  Myocardial perfusion imaging was performed at rest 45 minutes following the intravenous administration of Technetium 41m Sestamibi. Rest ECG: NSR - Normal  EKG  Stress Procedure:  The patient exercised on the treadmill utilizing the Bruce Protocol for 8:29 minutes. The patient stopped due to severe SOB and leg pain/fatique and denied any chest pain.  Technetium 75m Sestamibi was injected at peak exercise and myocardial perfusion imaging was performed after a brief delay. Stress ECG: No significant change from baseline ECG  QPS Raw Data Images:  Normal; no motion artifact; normal heart/lung ratio. Stress Images:  Normal homogeneous uptake in all areas of the myocardium. Rest Images:  Normal homogeneous uptake in all areas of the myocardium. Subtraction (SDS):  No evidence of ischemia. Transient Ischemic Dilatation (Normal <1.22):  n/a Lung/Heart Ratio (Normal <0.45):  0.47  Quantitative Gated Spect Images QGS EDV: 132 ml QGS ESV:  49 ml  Impression Exercise Capacity:  Good exercise capacity. BP Response:  Normal blood pressure response. Clinical Symptoms:  No chest pain. ECG Impression:  No significant ST segment change suggestive of ischemia. Comparison with Prior Nuclear Study: No images to compare  Overall Impression:  Normal stress nuclear study.  LV Ejection Fraction: 63%.  LV Wall Motion:  NL LV Function; NL Wall Motion   .Shannan Slinker  Normal stress test.  Please inform patient.   Marca Ancona 08/23/2013

## 2013-08-22 ENCOUNTER — Encounter (INDEPENDENT_AMBULATORY_CARE_PROVIDER_SITE_OTHER): Payer: 59

## 2013-08-22 ENCOUNTER — Encounter: Payer: Self-pay | Admitting: *Deleted

## 2013-08-22 DIAGNOSIS — R002 Palpitations: Secondary | ICD-10-CM

## 2013-08-22 NOTE — Progress Notes (Signed)
Patient ID: Brian Cortez, male   DOB: [birth year], 62 y.o.   MRN: [id number] [employer] 21 day cardiac event monitor placed on patient.

## 2013-08-23 ENCOUNTER — Ambulatory Visit (INDEPENDENT_AMBULATORY_CARE_PROVIDER_SITE_OTHER): Payer: 59 | Admitting: Neurology

## 2013-08-23 ENCOUNTER — Encounter: Payer: Self-pay | Admitting: Neurology

## 2013-08-23 VITALS — BP 133/84 | HR 69 | Resp 18 | Ht 75.0 in | Wt 270.0 lb

## 2013-08-23 DIAGNOSIS — G4733 Obstructive sleep apnea (adult) (pediatric): Secondary | ICD-10-CM

## 2013-08-23 DIAGNOSIS — G4730 Sleep apnea, unspecified: Secondary | ICD-10-CM | POA: Insufficient documentation

## 2013-08-23 NOTE — Progress Notes (Signed)
Pt.notified

## 2013-08-23 NOTE — Progress Notes (Signed)
[facility]  Provider:  Melvyn Novas, M D  Referring Provider: Pecola Lawless, MD Primary Care Physician:  [name], MD  Chief Complaint  Patient presents with  . Follow-up    sleep apnea    HPI:  Brian Cortez is a 62 y.o. male  Is seen here as a referral/ revisit  from Dr. Alwyn Ren for follow up on CPAP compliance in the treatment of OSA.  Diagnosed in a HST 11-14-12 , AHI  Reduced to 2.2 . Compliance  100% , 8 hours and 12 , 8 cm water with 2 cm EPR, residual AHI 2.2 , in office download.   Fatigue and lightheadedness were improved, until last month. He went to the ED and was diagnosed with diastolic heart failure, had a nuclear test with Dr. Marca Ancona - improved on Coreg.  FSS 23 , Epworth 2 points, 4 points on GDS.    He has no longer nocturia,  But wakes up every early morning.at 3.30 AM, not sure why.   Review of Systems: Out of a complete 14 system review, the patient complains of only the following symptoms, and all other reviewed systems are negative.  sleeping better, breathing better, has ciliary injection, hay fever.  Improved allergic rhinitis. Obese.  History   Social History  . Marital Status: Married    Spouse Name: Brian Cortez    Number of Children: 2  . Years of Education: Post Grad   Occupational History  . BANK -PRESIDENT   . PRESIDENT    Social History Main Topics  . Smoking status: Never Smoker   . Smokeless tobacco: Never Used  . Alcohol Use: 6 - 7.5 oz/week    12-15 drink(s) per week     Comment: 12-15 beverages weekly  . Drug Use: No  . Sexual Activity: Not on file   Other Topics Concern  . Not on file   Social History Narrative   Patient lives at home with spouse.    Caffeine Use: 3-4 cups    Family History  Problem Relation Age of Onset  . Heart attack Father 6  . Stroke Father 33  . Heart attack Paternal Grandfather     in 24s  . Prostate cancer Paternal Uncle   . Heart attack Paternal Uncle  41  . Colon cancer Neg Hx   . Heart attack Maternal Uncle 80  . Heart attack Mother     Past Medical History  Diagnosis Date  . Hyperlipidemia   . Hyperglycemia 2001    w/ normal A1c   . Liver enzyme elevation 2004  . Gilbert syndrome   . Colonic polyp     adenomatous, PMH of Dr Marina Goodell  . Diverticulosis   . Urethral stricture   . Obstructive sleep apnea (adult) (pediatric)   . Pure hypercholesterolemia   . Other symptoms involving respiratory system and chest   . Insomnia with sleep apnea, unspecified   . Testosterone deficiency   . High cholesterol     controlled  . Anxiety   . Obesity   . Cardiac abnormality   . Carpal tunnel syndrome   . DDD (degenerative disc disease), cervical   . GERD (gastroesophageal reflux disease)   . Sleep apnea, obstructive 02/20/2013    CPAP since 12-2012 . 9 cm water , [facility] .  Marland Kitchen Hypertension     Past Surgical History  Procedure Laterality Date  . Polypectomy  2007 & 2012    X2 ; Dr Marina Goodell  .  Cystoscopy      X2  . Urethral dilation      secondary to trauma; Dr Wanda Plump  . Tonsillectomy    . Vasectomy    . Cholecystectomy  11/07  . Varicose vein surgery  2002  . Facial fracture surgery  1987    blow out fracture & occipital fracture from basketball injuly   . Cataract extraction, bilateral  2&01/2012  . Obital optic      left eye: 1986    Current Outpatient Prescriptions  Medication Sig Dispense Refill  . AMBULATORY NON FORMULARY MEDICATION CPAP Machine      . aspirin 81 MG tablet Take 81 mg by mouth daily.        Marland Kitchen atorvastatin (LIPITOR) 40 MG tablet Take 1 tablet (40 mg total) by mouth daily. TAKE ONE TABLET EACH DAY  30 tablet  12  . carvedilol (COREG) 3.125 MG tablet Take 1 tablet (3.125 mg total) by mouth 2 (two) times daily.  60 tablet  12  . escitalopram (LEXAPRO) 20 MG tablet Take 20 mg by mouth daily.      Marland Kitchen esomeprazole (NEXIUM) 20 MG capsule Take 20 mg by mouth at bedtime.      Marland Kitchen latanoprost (XALATAN) 0.005 %  ophthalmic solution Place 1 drop into both eyes daily.      Marland Kitchen lisinopril (PRINIVIL,ZESTRIL) 20 MG tablet Take 20 mg by mouth daily.      . Loratadine (ALAVERT PO) Take 1 tablet by mouth daily.      . Multiple Vitamins-Minerals (CENTRUM SILVER PO) Take 1 tablet by mouth daily.      . Omega-3 Fatty Acids (FISH OIL) 1000 MG CAPS Take 1,000 mg by mouth 2 (two) times daily.      Marland Kitchen testosterone cypionate (DEPOTESTOTERONE CYPIONATE) 100 MG/ML injection Inject 300 mg into the muscle every 14 (fourteen) days. For IM use only       No current facility-administered medications for this visit.    Allergies as of 08/23/2013 - Review Complete 08/23/2013  Allergen Reaction Noted  . Pollen extract Cough 01/13/2013    Vitals: BP 133/84  Pulse 69  Resp 18  Ht 6\' 3"  (1.905 m)  Wt 270 lb (122.471 kg)  BMI 33.75 kg/m2 Last Weight:  Wt Readings from Last 1 Encounters:  08/23/13 270 lb (122.471 kg)   Last Height:   Ht Readings from Last 1 Encounters:  08/23/13 6\' 3"  (1.905 m)     General: The patient is awake, alert and appears not in acute distress. The patient is well groomed. Head: Normocephalic, atraumatic. Neck is supple. Mallampati 3, neck circumference: unchanged.  Cardiovascular:  Regular rate and rhythm, without  murmurs or carotid bruit, and without distended neck veins. Respiratory: Lungs are clear to auscultation. Skin:  Without evidence of edema, or rash Trunk: BMI remains elevated and patient  has normal posture.   Neurologic exam : The patient is awake and alert, oriented to place and time.  Memory subjective   described as intact. There is a normal attention span & concentration ability. Speech is fluent without   dysarthria, dysphonia or aphasia.  Mood and affect are appropriate.  Cranial nerves: Pupils are equal and briskly reactive to light. Funduscopic exam without   evidence of pallor or edema. Extraocular movements  in vertical and horizontal planes intact and without  nystagmus.  Visual fields by finger perimetry are intact. Hearing to finger rub intact.  Facial sensation intact to fine touch. Facial motor strength is symmetric  and tongue and uvula move midline.  Motor exam: Normal tone and normal muscle bulk and symmetric normal strength in all extremities.  Sensory:  Fine touch, pinprick and vibration were tested in all extremities. Proprioception is  normal.  Coordination: Rapid alternating movements in the fingers/hands is tested and normal.  Gait and station: Patient walks without assistive device . Strength within normal limits. Stance is stable and normal. Tandem gait is deferred.  Deep tendon reflexes: in the  upper and lower extremities are symmetric and intact. Babinski maneuver  downgoing.   Assessment:  After physical and neurologic examination, review of laboratory studies, imaging, neurophysiology testing and pre-existing records, assessment will be reviewed on the problem list.  OSA - on CPAP well controlled.  Obesity , not reduced over the last 10 month.   Plan:  Treatment plan and additional workup will be reviewed under Problem List. CPAP 8 cm , [facility].  Information and education were provided in previous visit.

## 2013-08-23 NOTE — Patient Instructions (Signed)
CPAP and BIPAP CPAP and BIPAP are methods of helping you breathe. CPAP stands for "continuous positive airway pressure." BIPAP stands for "bi-level positive airway pressure." Both CPAP and BIPAP are provided by a small machine with a flexible plastic tube that attaches to a plastic mask that goes over your nose or mouth. Air is blown into your air passages through your nose or mouth. This helps to keep your airways open and helps to keep you breathing well. The amount of pressure that is used to blow the air into your air passages can be set on the machine. The pressure setting is based on your needs. With CPAP, the amount of pressure stays the same while you breathe in and out. With BIPAP, the amount of pressure changes when you inhale and exhale. Your caregiver will recommend whether CPAP or BIPAP would be more helpful for you.  CPAP and BIPAP can be helpful for both adults and children with:  Sleep apnea.  Chronic Obstructive Pulmonary Disease (COPD), a condition like emphysema.  Diseases which weaken the muscles of the chest such as muscular dystrophy or neurological diseases.  Other problems that cause breathing to be weak or difficult. USE OF CPAP OR BIPAP The respiratory therapist or technician will help you get used to wearing the mask. Some people feel claustrophobic (a trapped or closed in feeling) at first, because the mask needs to be fairly snug on your face.   It may help you to get used to the mask gradually, by first holding the mask loosely over your nose or mouth using a low pressure setting on the machine. Gradually the mask can be applied more snugly with increased pressure. You can also gradually increase the amount of time the mask is used.  People with sleep apnea will use the mask and machine at night when they are sleeping. Others, like those with ALS or other breathing difficulties, may need the CPAP or BIPAP all the time.  If the first mask you try does not fit well, or  is uncomfortable, there are other types and sizes that can be tried.  If you tend to breathe through your mouth, a chin strap may be applied to help keep your mouth closed (if you are using a nasal mask).  The CPAP and BIPAP machines have alarms that may sound if the mask comes off or develops a leak.  You should not eat or drink while the CPAP or BIPAP is on. Food or fluids could get pushed into your lungs by the pressure of the CPAP or BIPAP. Sometimes CPAP or BIPAP machines are ordered for home use. If you are going to use the CPAP or BIPAP machine at home, follow these instructions  CPAP or BIPAP machines can be rented or purchased through home health care companies. There are many different brands of machines available. If you rent a machine before purchasing you may find which particular machine works well for you.  Ask questions if there is something you do not understand when picking out your machine.  Place your CPAP or BIPAP machine on a secure table or stand near an electrical outlet.  Know where the On/Off switch is.  Follow your doctor's instructions for how to set the pressure on your machine and when you should use it.  Do not smoke! Tobacco smoke residue can damage the machine. SEEK IMMEDIATE MEDICAL CARE IF:   You have redness or open areas around your nose or mouth.  You have trouble operating   the CPAP or BIPAP machine.  You cannot tolerate wearing the CPAP or BIPAP mask.  You have any questions or concerns. Document Released: 08/06/2004 Document Revised: 01/31/2012 Document Reviewed: 11/05/2008 [employer] Patient Information 2014 [employer].  

## 2013-08-28 ENCOUNTER — Encounter: Payer: Self-pay | Admitting: Cardiology

## 2013-08-28 ENCOUNTER — Encounter: Payer: Self-pay | Admitting: Neurology

## 2013-08-29 ENCOUNTER — Ambulatory Visit (INDEPENDENT_AMBULATORY_CARE_PROVIDER_SITE_OTHER): Payer: 59 | Admitting: Cardiology

## 2013-08-29 ENCOUNTER — Encounter: Payer: Self-pay | Admitting: Cardiology

## 2013-08-29 VITALS — BP 128/96 | HR 72 | Ht 75.0 in | Wt 276.0 lb

## 2013-08-29 DIAGNOSIS — I429 Cardiomyopathy, unspecified: Secondary | ICD-10-CM

## 2013-08-29 DIAGNOSIS — I2510 Atherosclerotic heart disease of native coronary artery without angina pectoris: Secondary | ICD-10-CM

## 2013-08-29 DIAGNOSIS — I428 Other cardiomyopathies: Secondary | ICD-10-CM

## 2013-08-29 DIAGNOSIS — E782 Mixed hyperlipidemia: Secondary | ICD-10-CM

## 2013-08-29 MED ORDER — CARVEDILOL 6.25 MG PO TABS: 6.2500 mg | ORAL_TABLET | Freq: Two times a day (BID) | ORAL | Status: DC

## 2013-08-29 NOTE — Patient Instructions (Addendum)
Increase coreg (carvedilol) to 6.25mg  two times a day. You can take 2 of your 3.125mg  tablets two times a day and use your current supply.  Your physician recommends that you return for a FASTING lipid profile.  Your physician wants you to follow-up in: 6 months with Dr Shirlee Latch. (April 2015). You will receive a reminder letter in the mail two months in advance. If you don't receive a letter, please call our office to schedule the follow-up appointment.

## 2013-08-30 NOTE — Progress Notes (Signed)
Patient ID: Brian Cortez, male   DOB: Apr 12, 1951, 62 y.o.   MRN: [id number] PCP: Dr. Alwyn Ren  62 yo with history of hyperlipidemia and HTN returns for cardiology evaluation after recent hospitalization.  As patient was walking out of church on Sunday a few weeks ago, he noted a profound, extreme fatigue like he "had run a marathon."  He was a little lightheaded.  He had a little chest pressure (very minimal).  Symptoms lasted for a couple of hours and slowly/gradually resolved.  He did not feel tachypalpitations.  Later that week, while sitting at his desk at work, the same sensation of extreme fatigue/tiredness came over him (mild chest pressure, mild lightheadedness).  This lasted several hours again and gradually dissipated. At this point, he went to the ER for evaluation and ended up admitted overnight.  ECG was nonspecific.  CTA chest showed no PE but did show probable mild pulmonary edema as well as coronary artery calcification.  BNP was normal, cardiac enzymes were normal, and no arrhythmias showed up on telemetry.  He did reasonably well after discharge until about a week later.  He was at a [employer] and again developed severe fatigue (like running "2 marathons").   This again gradually resolved.  He did an exercise class later that day with no problems. Of note, throughout this, patient had not had any exertional dyspnea or chest pain.  Echo was done showing EF 45-50% with mild global hypokinesis.    Given the symptoms of profound tiredness, the coronary artery calcification on CT, and the mildly decreased LV systolic function, I had him do an ETT-Cardiolite.  This was actually a normal study with 8:29 exercise, no ECG changes, and no evidence for ischemia or infarction.  Interestingly, EF was 63% on the Cardiolite (contrasting with the echo).  At the last appointment, I started him on Coreg at 3.125 mg bid.  He feels like the Coreg has made him feel better.  He has had no more of the spells  of profound tiredness since I last saw him.  He is currently wearing an event monitor to look for arrhythmias.  So far, I have not seen anything.  He has been working out with a trainer 3-4 times a week without exertional dyspnea or chest pain.    Labs (9/14): K 4.6, creatinine 1.1, LDL 85, HDL 41, LFTs normal, troponin negative x 3, BNP normal  PMH: 1.  OSA on CPAP 2. Hyperlipidemia 3. Depression 4. GERD 5. HTN 6. Low testosterone 7. Colonic polyps 8. CAD: Coronary calcification on CT chest in 9/14.  ETT-Cardiolite (10/14) with 8:29 exercise, no ischemic ECG changes, no ischemia or infarction on Cardiolite images.   9. Cardiomyopathy: Echo (9/14) with EF 45-50%, mild LVH, diffuse hypokinesis, moderate diastolic dysfunction, normal RV size with mildly decreased systolic function.   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   FH: No history of MI.  Father had CVA (was a smoker).   ROS: All systems reviewed and negative except as per HPI.   Current Outpatient Prescriptions  Medication Sig Dispense Refill  . AMBULATORY NON FORMULARY MEDICATION CPAP Machine      . aspirin 81 MG tablet Take 81 mg by mouth daily.        Marland Kitchen atorvastatin (LIPITOR) 40 MG tablet Take 1 tablet (40 mg total) by mouth daily. TAKE ONE TABLET EACH DAY  30 tablet  12  . escitalopram (LEXAPRO)  20 MG tablet Take 20 mg by mouth daily.      Marland Kitchen esomeprazole (NEXIUM) 20 MG capsule Take 20 mg by mouth at bedtime.      Marland Kitchen latanoprost (XALATAN) 0.005 % ophthalmic solution Place 1 drop into both eyes daily.      Marland Kitchen lisinopril (PRINIVIL,ZESTRIL) 20 MG tablet Take 20 mg by mouth daily.      . Loratadine (ALAVERT PO) Take 1 tablet by mouth as needed.       . Multiple Vitamins-Minerals (CENTRUM SILVER PO) Take 1 tablet by mouth daily.      . Omega-3 Fatty Acids (FISH OIL) 1000 MG CAPS Take 1,000 mg by mouth 2 (two) times daily.      Marland Kitchen testosterone cypionate  (DEPOTESTOTERONE CYPIONATE) 100 MG/ML injection Inject 300 mg into the muscle every 14 (fourteen) days. For IM use only      . carvedilol (COREG) 6.25 MG tablet Take 1 tablet (6.25 mg total) by mouth 2 (two) times daily.  60 tablet  6   No current facility-administered medications for this visit.    BP 128/96  Pulse 72  Ht 6\' 3"  (1.905 m)  Wt 125.193 kg (276 lb)  BMI 34.5 kg/m2  SpO2 95% General: NAD Neck: Thick, no JVD, no thyromegaly or thyroid nodule.  Lungs: Clear to auscultation bilaterally with normal respiratory effort. CV: Nondisplaced PMI.  Heart regular S1/S2, no S3/S4, no murmur.  No peripheral edema.  No carotid bruit.  Normal pedal pulses.  Abdomen: Soft, nontender, no hepatosplenomegaly, no distention.  Skin: Intact without lesions or rashes.  Neurologic: Alert and oriented x 3.  Psych: Normal affect. Extremities: No clubbing or cyanosis.   Assessment/Plan: 1. Episodes of extreme fatigue: These have not recurred since last appointment.  ETT-Cardiolite was totally normal, which is reassuring.  So far, event monitor has shown no arrhythmias.  I would like him to wear it for an additional week.  2. CAD: Noted on CT chest.  No ischemic symptoms since last appointment.  ETT-Cardiolite was normal.  At this time, would have him continue ASA 81, Coreg, and atorvastatin without further testing. 3. Cardiomyopathy: Mildly depressed LV and RV systolic function on 9/14 echo.  The mild RV dysfunction could presumably be due to OSA with some pulmonary HTN, but I cannot explain the LV systolic dysfunction.  It is possible that something such as heavy ETOH intake could account for systolic dysfunction.  He endorses 3 glasses of wine on 5 days out of the week.  This probably is not heavy enough to cause a severe cardiomyopathy but it would not hurt to cut back a little.  I did not find evidence for significant obstructive CAD on Cardiolite.  Interestingly, EF was normal on the Cardiolite.  -  Continue lisinopril - Increase Coreg to 6.25 mg bid. - Repeat echo in 6 months.  4. Hyperlipidemia: Patient has CAD by calcium in coronaries on CT. I increased atorvastatin at last appointment to 40 mg daily.  Will get lipids/LFTs in about 2 months.   Marca Ancona 08/30/2013

## 2013-08-30 NOTE — Telephone Encounter (Signed)
Pt had appt with Dr Shirlee Latch 08/29/13

## 2013-09-06 ENCOUNTER — Encounter: Payer: Self-pay | Admitting: Cardiology

## 2013-09-27 ENCOUNTER — Other Ambulatory Visit: Payer: Self-pay

## 2013-10-01 ENCOUNTER — Encounter: Payer: Self-pay | Admitting: Cardiology

## 2013-10-16 ENCOUNTER — Other Ambulatory Visit (INDEPENDENT_AMBULATORY_CARE_PROVIDER_SITE_OTHER): Payer: 59

## 2013-10-16 DIAGNOSIS — E785 Hyperlipidemia, unspecified: Secondary | ICD-10-CM

## 2013-10-16 LAB — HEPATIC FUNCTION PANEL
ALT: 38 U/L (ref 0–53)
AST: 39 U/L — ABNORMAL HIGH (ref 0–37)
Albumin: 4.1 g/dL (ref 3.5–5.2)
Alkaline Phosphatase: 40 U/L (ref 39–117)
Bilirubin, Direct: 0.1 mg/dL (ref 0.0–0.3)
Total Bilirubin: 1 mg/dL (ref 0.3–1.2)
Total Protein: 6.9 g/dL (ref 6.0–8.3)

## 2013-10-16 LAB — LIPID PANEL
Cholesterol: 106 mg/dL (ref 0–200)
HDL: 34.3 mg/dL — ABNORMAL LOW (ref >39.00)
LDL Cholesterol: 42 mg/dL (ref 0–99)
Total CHOL/HDL Ratio: 3
Triglycerides: 147 mg/dL (ref 0.0–149.0)
VLDL: 29.4 mg/dL (ref 0.0–40.0)

## 2013-10-19 ENCOUNTER — Other Ambulatory Visit: Payer: Self-pay | Admitting: Internal Medicine

## 2013-10-19 NOTE — Telephone Encounter (Signed)
Lisinopril refilled per protocol 

## 2013-10-22 ENCOUNTER — Encounter: Payer: Self-pay | Admitting: Internal Medicine

## 2013-10-22 ENCOUNTER — Ambulatory Visit (INDEPENDENT_AMBULATORY_CARE_PROVIDER_SITE_OTHER): Payer: 59 | Admitting: Internal Medicine

## 2013-10-22 VITALS — BP 128/80 | HR 63 | Temp 97.6°F | Ht 75.75 in | Wt 269.2 lb

## 2013-10-22 DIAGNOSIS — E782 Mixed hyperlipidemia: Secondary | ICD-10-CM

## 2013-10-22 DIAGNOSIS — R0789 Other chest pain: Secondary | ICD-10-CM

## 2013-10-22 DIAGNOSIS — Z0000 Encounter for general adult medical examination without abnormal findings: Secondary | ICD-10-CM

## 2013-10-22 DIAGNOSIS — K219 Gastro-esophageal reflux disease without esophagitis: Secondary | ICD-10-CM

## 2013-10-22 LAB — TROPONIN I: Troponin I: <0.01 ng/mL (ref <0.06)

## 2013-10-22 NOTE — Progress Notes (Signed)
Pre visit review using our clinic review tool, if applicable. No additional management support is needed unless otherwise documented below in the visit note. 

## 2013-10-22 NOTE — Patient Instructions (Addendum)
Your next office appointment will be determined based upon review of your pending labs . Those instructions will be transmitted to you through My Chart  . Please report any significant change in your symptoms. Reflux of gastric acid may be asymptomatic as this may occur mainly during sleep.The triggers for reflux  include stress; the "aspirin family" ; alcohol; peppermint; and caffeine (coffee, tea, cola, and chocolate). The aspirin family would include aspirin and the nonsteroidal agents such as ibuprofen &  Naproxen. Tylenol would not cause reflux. If having symptoms ; food & drink should be avoided for @ least 2 hours before going to bed.  Take Nexium before b'fast & eve meal

## 2013-10-22 NOTE — Progress Notes (Signed)
Subjective:    Patient ID: Brian Cortez, male    DOB: 14-Aug-1951, 62 y.o.   MRN: [id number]  HPI  Brian Cortez is here for a physical;acute issues include substernal chest pain.     Review of Systems  Chest symptoms began 2 weeks ago w/o trigger;it was unrelated to any specific associated activity , trauma/injury or  immobilization such as prolonged travel or bed rest: The pain is dull , up to a 5, & non radiating. The pain is constant. No exacerbating factors as deep breathing, movement, position change, or exercise. No associated  nausea ,sweating , shortness of breath, or palpitations. No interventions which relieve or decrease the discomfort noted. No associated dyspepsia ;dysphagia ;abdominal pain ;unexplained weight loss; black or tarry bowel movements;or  rectal bleeding. No associated cough; sputum production;  hemoptysis ;or radiation of the pain from the back around to the front of the chest pain No ankle swelling present No paroxysmal nocturnal  dyspnea . No  pain in calves with walking. No associated dizziness or  syncope . No associated rash; color change;or temperature change in the area of the pain described.  Has a past history of hiatal hernia @ endoscopy. He's never had esophageal stricture or Barrett's esophagus. He believes he is on 40 mg of Nexium at this time.  He is being followed by Dr. Shirlee Cortez for diastolic dysfunction. BP @ home not monitored.                Objective:   Physical Exam Gen.:  well-nourished in appearance. Alert, appropriate and cooperative throughout exam.  Head: Normocephalic without obvious abnormalities;  Pattern alopecia  Eyes: No corneal or conjunctival inflammation noted. Pupils equal round reactive to light and accommodation. Extraocular motion intact.  Ears: External  ear exam reveals no significant lesions or deformities. Canals clear .TMs normal. Hearing is grossly normal bilaterally. Nose: External nasal exam  reveals no deformity or inflammation. Nasal mucosa are pink and moist. No lesions or exudates noted.  Mouth: Oral mucosa and oropharynx reveal no lesions or exudates. Teeth in good repair. Oropharynx crowded & mildly erythematous Neck: No deformities, masses, or tenderness noted. Range of motion & Thyroid normal. Lungs: Normal respiratory effort; chest expands symmetrically. Lungs are clear to auscultation without rales, wheezes, or increased work of breathing. Heart: Normal rate and rhythm. Normal S1 and S2. No gallop, click, or rub. S4 w/o murmur. Abdomen: Bowel sounds normal; abdomen soft and nontender. No masses, organomegaly or hernias noted. Genitalia:  as per Dr Brian Cortez                                 Musculoskeletal/extremities:  Slightly accentuated curvature of upper thoracic spine. No clubbing, cyanosis, edema, or significant extremity  deformity noted. Range of motion normal .Tone & strength normal. Hand joints normal . Fingernail health good. Able to lie down & sit up w/o help. Negative SLR bilaterally Vascular: Carotid, radial artery, dorsalis pedis and  posterior tibial pulses are full and equal. No bruits present.Homan's negative. Neurologic: Alert and oriented x3. Deep tendon reflexes symmetrical and normal.      Skin: Intact without suspicious lesions or rashes. Lymph: No cervical, axillary lymphadenopathy present. Psych: Mood and affect are normal. Normally interactive  Assessment & Plan:  #1 comprehensive physical exam; no acute findings #2 atypical chest pain ; probably esophageal in etiology  Plan: see Orders  & Recommendations

## 2013-11-01 ENCOUNTER — Encounter: Payer: Self-pay | Admitting: Internal Medicine

## 2013-11-05 ENCOUNTER — Other Ambulatory Visit: Payer: Self-pay | Admitting: Urology

## 2013-11-05 ENCOUNTER — Other Ambulatory Visit (HOSPITAL_COMMUNITY): Payer: Self-pay | Admitting: *Deleted

## 2013-11-05 ENCOUNTER — Encounter (HOSPITAL_COMMUNITY): Payer: Self-pay | Admitting: *Deleted

## 2013-11-05 MED ORDER — DEXTROSE 5 % IV SOLN
3.0000 g | INTRAVENOUS | Status: AC
  Administered 2013-11-06: 3 g via INTRAVENOUS
  Filled 2013-11-05: qty 3000

## 2013-11-06 ENCOUNTER — Encounter (HOSPITAL_COMMUNITY): Payer: Self-pay | Admitting: *Deleted

## 2013-11-06 ENCOUNTER — Inpatient Hospital Stay (HOSPITAL_COMMUNITY)
Admission: RE | Admit: 2013-11-06 | Discharge: 2013-11-08 | DRG: 669 | Disposition: A | Discharge status: Home / Self Care | Payer: 59 | Source: Ambulatory Visit | Attending: Urology | Admitting: Urology

## 2013-11-06 ENCOUNTER — Encounter (HOSPITAL_COMMUNITY)
Admission: RE | Disposition: A | Discharge status: Home / Self Care | Payer: Self-pay | Source: Ambulatory Visit | Attending: Urology

## 2013-11-06 ENCOUNTER — Ambulatory Visit (HOSPITAL_COMMUNITY): Payer: 59 | Admitting: Certified Registered"

## 2013-11-06 ENCOUNTER — Encounter (HOSPITAL_COMMUNITY): Payer: 59 | Admitting: Certified Registered"

## 2013-11-06 DIAGNOSIS — R030 Elevated blood-pressure reading, without diagnosis of hypertension: Secondary | ICD-10-CM | POA: Diagnosis present

## 2013-11-06 DIAGNOSIS — I5032 Chronic diastolic (congestive) heart failure: Secondary | ICD-10-CM | POA: Diagnosis present

## 2013-11-06 DIAGNOSIS — N35919 Unspecified urethral stricture, male, unspecified site: Secondary | ICD-10-CM | POA: Diagnosis present

## 2013-11-06 DIAGNOSIS — E291 Testicular hypofunction: Secondary | ICD-10-CM | POA: Diagnosis present

## 2013-11-06 DIAGNOSIS — Z8249 Family history of ischemic heart disease and other diseases of the circulatory system: Secondary | ICD-10-CM

## 2013-11-06 DIAGNOSIS — G4730 Sleep apnea, unspecified: Secondary | ICD-10-CM | POA: Diagnosis present

## 2013-11-06 DIAGNOSIS — Z87442 Personal history of urinary calculi: Secondary | ICD-10-CM

## 2013-11-06 DIAGNOSIS — Z79899 Other long term (current) drug therapy: Secondary | ICD-10-CM

## 2013-11-06 DIAGNOSIS — K219 Gastro-esophageal reflux disease without esophagitis: Secondary | ICD-10-CM | POA: Diagnosis present

## 2013-11-06 DIAGNOSIS — I509 Heart failure, unspecified: Secondary | ICD-10-CM | POA: Diagnosis present

## 2013-11-06 DIAGNOSIS — H409 Unspecified glaucoma: Secondary | ICD-10-CM | POA: Diagnosis present

## 2013-11-06 DIAGNOSIS — I771 Stricture of artery: Secondary | ICD-10-CM | POA: Diagnosis present

## 2013-11-06 DIAGNOSIS — N529 Male erectile dysfunction, unspecified: Secondary | ICD-10-CM | POA: Diagnosis present

## 2013-11-06 DIAGNOSIS — C679 Malignant neoplasm of bladder, unspecified: Principal | ICD-10-CM | POA: Diagnosis present

## 2013-11-06 DIAGNOSIS — F411 Generalized anxiety disorder: Secondary | ICD-10-CM | POA: Diagnosis present

## 2013-11-06 DIAGNOSIS — N400 Enlarged prostate without lower urinary tract symptoms: Secondary | ICD-10-CM | POA: Diagnosis present

## 2013-11-06 HISTORY — PX: TRANSURETHRAL RESECTION OF BLADDER TUMOR WITH GYRUS (TURBT-GYRUS): SHX6458

## 2013-11-06 LAB — BASIC METABOLIC PANEL
GFR calc Af Amer: 75 mL/min — ABNORMAL LOW (ref >90)
GFR calc non Af Amer: 65 mL/min — ABNORMAL LOW (ref >90)
Potassium: 4.3 mEq/L (ref 3.5–5.1)
Sodium: 136 mEq/L (ref 135–145)

## 2013-11-06 LAB — CBC
Hemoglobin: 16.7 g/dL (ref 13.0–17.0)
MCV: 91.7 fL (ref 78.0–100.0)
RBC: 5.41 MIL/uL (ref 4.22–5.81)

## 2013-11-06 SURGERY — TRANSURETHRAL RESECTION OF BLADDER TUMOR WITH GYRUS (TURBT-GYRUS)
Anesthesia: General | Site: Bladder

## 2013-11-06 MED ORDER — 0.9 % SODIUM CHLORIDE (POUR BTL) OPTIME
TOPICAL | Status: DC | PRN
  Administered 2013-11-06: 1000 mL

## 2013-11-06 MED ORDER — OXYCODONE-ACETAMINOPHEN 5-325 MG PO TABS
1.0000 | ORAL_TABLET | ORAL | Status: DC | PRN
  Administered 2013-11-07: 1 via ORAL
  Administered 2013-11-07: 2 via ORAL
  Filled 2013-11-06: qty 1
  Filled 2013-11-06: qty 2

## 2013-11-06 MED ORDER — SODIUM CHLORIDE 0.45 % IV SOLN
INTRAVENOUS | Status: DC
  Administered 2013-11-06 – 2013-11-07 (×2): via INTRAVENOUS

## 2013-11-06 MED ORDER — OXYBUTYNIN CHLORIDE 5 MG PO TABS
5.0000 mg | ORAL_TABLET | Freq: Three times a day (TID) | ORAL | Status: DC | PRN
  Filled 2013-11-06: qty 1

## 2013-11-06 MED ORDER — KETOROLAC TROMETHAMINE 30 MG/ML IJ SOLN
INTRAMUSCULAR | Status: DC | PRN
  Administered 2013-11-06: 30 mg via INTRAVENOUS

## 2013-11-06 MED ORDER — MIDAZOLAM HCL 5 MG/5ML IJ SOLN
INTRAMUSCULAR | Status: DC | PRN
  Administered 2013-11-06: 2 mg via INTRAVENOUS

## 2013-11-06 MED ORDER — LORATADINE 10 MG PO TABS
10.0000 mg | ORAL_TABLET | Freq: Every day | ORAL | Status: DC | PRN
  Administered 2013-11-08: 10 mg via ORAL
  Filled 2013-11-06 (×2): qty 1

## 2013-11-06 MED ORDER — FENTANYL CITRATE 0.05 MG/ML IJ SOLN
INTRAMUSCULAR | Status: AC
  Filled 2013-11-06: qty 2

## 2013-11-06 MED ORDER — ADULT MULTIVITAMIN W/MINERALS CH
1.0000 | ORAL_TABLET | Freq: Every morning | ORAL | Status: DC
  Administered 2013-11-07 – 2013-11-08 (×2): 1 via ORAL
  Filled 2013-11-06 (×2): qty 1

## 2013-11-06 MED ORDER — HYDROMORPHONE HCL PF 1 MG/ML IJ SOLN
0.5000 mg | INTRAMUSCULAR | Status: DC | PRN
  Administered 2013-11-06 – 2013-11-07 (×3): 1 mg via INTRAVENOUS
  Filled 2013-11-06 (×3): qty 1

## 2013-11-06 MED ORDER — FENTANYL CITRATE 0.05 MG/ML IJ SOLN
25.0000 ug | INTRAMUSCULAR | Status: DC | PRN
  Administered 2013-11-06 (×2): 50 ug via INTRAVENOUS

## 2013-11-06 MED ORDER — MIDAZOLAM HCL 2 MG/2ML IJ SOLN
INTRAMUSCULAR | Status: AC
  Filled 2013-11-06: qty 2

## 2013-11-06 MED ORDER — LACTATED RINGERS IV SOLN
INTRAVENOUS | Status: DC | PRN
  Administered 2013-11-06: 18:00:00 via INTRAVENOUS

## 2013-11-06 MED ORDER — PROPOFOL 10 MG/ML IV BOLUS
INTRAVENOUS | Status: AC
  Filled 2013-11-06: qty 20

## 2013-11-06 MED ORDER — DIPHENHYDRAMINE HCL 50 MG/ML IJ SOLN
12.5000 mg | Freq: Four times a day (QID) | INTRAMUSCULAR | Status: DC | PRN
  Administered 2013-11-07: 05:00:00 12.5 mg via INTRAVENOUS
  Filled 2013-11-06: qty 1

## 2013-11-06 MED ORDER — CARVEDILOL 6.25 MG PO TABS
6.2500 mg | ORAL_TABLET | Freq: Two times a day (BID) | ORAL | Status: DC
  Administered 2013-11-06 – 2013-11-08 (×4): 6.25 mg via ORAL
  Filled 2013-11-06 (×6): qty 1

## 2013-11-06 MED ORDER — LIDOCAINE HCL (CARDIAC) 20 MG/ML IV SOLN
INTRAVENOUS | Status: AC
  Filled 2013-11-06: qty 5

## 2013-11-06 MED ORDER — LISINOPRIL 20 MG PO TABS
20.0000 mg | ORAL_TABLET | Freq: Every morning | ORAL | Status: DC
  Administered 2013-11-07 – 2013-11-08 (×2): 20 mg via ORAL
  Filled 2013-11-06 (×2): qty 1

## 2013-11-06 MED ORDER — ONDANSETRON HCL 4 MG/2ML IJ SOLN
INTRAMUSCULAR | Status: AC
  Filled 2013-11-06: qty 2

## 2013-11-06 MED ORDER — ZOLPIDEM TARTRATE 5 MG PO TABS: 5.0000 mg | ORAL_TABLET | Freq: Every evening | ORAL | Status: DC | PRN

## 2013-11-06 MED ORDER — FENTANYL CITRATE 0.05 MG/ML IJ SOLN
INTRAMUSCULAR | Status: DC | PRN
  Administered 2013-11-06 (×2): 50 ug via INTRAVENOUS

## 2013-11-06 MED ORDER — SODIUM CHLORIDE 0.9 % IR SOLN
Status: DC | PRN
  Administered 2013-11-06: 18000 mL

## 2013-11-06 MED ORDER — LIDOCAINE HCL (CARDIAC) 20 MG/ML IV SOLN
INTRAVENOUS | Status: DC | PRN
  Administered 2013-11-06: 30 mg via INTRAVENOUS

## 2013-11-06 MED ORDER — ATORVASTATIN CALCIUM 40 MG PO TABS
40.0000 mg | ORAL_TABLET | Freq: Every evening | ORAL | Status: DC
Start: 2013-11-06 — End: 2013-11-08
  Administered 2013-11-06 – 2013-11-07 (×2): 40 mg via ORAL
  Filled 2013-11-06 (×3): qty 1

## 2013-11-06 MED ORDER — DIPHENHYDRAMINE HCL 12.5 MG/5ML PO ELIX: 12.5000 mg | ORAL_SOLUTION | Freq: Four times a day (QID) | ORAL | Status: DC | PRN

## 2013-11-06 MED ORDER — ACETAMINOPHEN 10 MG/ML IV SOLN
1000.0000 mg | Freq: Once | INTRAVENOUS | Status: AC
  Administered 2013-11-06: 1000 mg via INTRAVENOUS
  Filled 2013-11-06: qty 100

## 2013-11-06 MED ORDER — BELLADONNA ALKALOIDS-OPIUM 16.2-60 MG RE SUPP
RECTAL | Status: AC
  Filled 2013-11-06: qty 1

## 2013-11-06 MED ORDER — BISACODYL 10 MG RE SUPP: 10.0000 mg | Freq: Every day | RECTAL | Status: DC | PRN

## 2013-11-06 MED ORDER — KETOROLAC TROMETHAMINE 30 MG/ML IJ SOLN
INTRAMUSCULAR | Status: AC
  Filled 2013-11-06: qty 1

## 2013-11-06 MED ORDER — ESCITALOPRAM OXALATE 20 MG PO TABS
20.0000 mg | ORAL_TABLET | Freq: Every day | ORAL | Status: DC
  Administered 2013-11-07 – 2013-11-08 (×2): 20 mg via ORAL
  Filled 2013-11-06 (×2): qty 1

## 2013-11-06 MED ORDER — ONDANSETRON HCL 4 MG/2ML IJ SOLN: 4.0000 mg | INTRAMUSCULAR | Status: DC | PRN

## 2013-11-06 MED ORDER — MEPERIDINE HCL 50 MG/ML IJ SOLN: 6.2500 mg | INTRAMUSCULAR | Status: DC | PRN

## 2013-11-06 MED ORDER — ACETAMINOPHEN 325 MG PO TABS: 650.0000 mg | ORAL_TABLET | ORAL | Status: DC | PRN

## 2013-11-06 MED ORDER — KETOROLAC TROMETHAMINE 30 MG/ML IJ SOLN
30.0000 mg | Freq: Four times a day (QID) | INTRAMUSCULAR | Status: AC
  Administered 2013-11-07 (×4): 30 mg via INTRAVENOUS
  Filled 2013-11-06 (×4): qty 1

## 2013-11-06 MED ORDER — BACITRACIN-NEOMYCIN-POLYMYXIN 400-5-5000 EX OINT: 1.0000 "application " | TOPICAL_OINTMENT | Freq: Three times a day (TID) | CUTANEOUS | Status: DC | PRN

## 2013-11-06 MED ORDER — LACTATED RINGERS IV SOLN: INTRAVENOUS | Status: DC

## 2013-11-06 MED ORDER — CIPROFLOXACIN HCL 500 MG PO TABS
500.0000 mg | ORAL_TABLET | Freq: Two times a day (BID) | ORAL | Status: DC
  Administered 2013-11-06 – 2013-11-08 (×4): 500 mg via ORAL
  Filled 2013-11-06 (×6): qty 1

## 2013-11-06 MED ORDER — LATANOPROST 0.005 % OP SOLN
1.0000 [drp] | Freq: Every morning | OPHTHALMIC | Status: DC
  Administered 2013-11-07 – 2013-11-08 (×2): 1 [drp] via OPHTHALMIC
  Filled 2013-11-06: qty 2.5

## 2013-11-06 MED ORDER — LIDOCAINE HCL 2 % EX GEL
CUTANEOUS | Status: AC
  Filled 2013-11-06: qty 10

## 2013-11-06 MED ORDER — ONDANSETRON HCL 4 MG/2ML IJ SOLN
INTRAMUSCULAR | Status: DC | PRN
  Administered 2013-11-06: 4 mg via INTRAVENOUS

## 2013-11-06 MED ORDER — PANTOPRAZOLE SODIUM 40 MG PO TBEC
40.0000 mg | DELAYED_RELEASE_TABLET | Freq: Every day | ORAL | Status: DC
  Administered 2013-11-07 – 2013-11-08 (×2): 40 mg via ORAL
  Filled 2013-11-06 (×2): qty 1

## 2013-11-06 MED ORDER — CEFAZOLIN SODIUM-DEXTROSE 2-3 GM-% IV SOLR
INTRAVENOUS | Status: AC
  Filled 2013-11-06: qty 100

## 2013-11-06 MED ORDER — PROPOFOL 10 MG/ML IV BOLUS
INTRAVENOUS | Status: DC | PRN
  Administered 2013-11-06: 200 mg via INTRAVENOUS
  Administered 2013-11-06: 100 mg via INTRAVENOUS

## 2013-11-06 MED ORDER — BELLADONNA ALKALOIDS-OPIUM 16.2-60 MG RE SUPP
RECTAL | Status: DC | PRN
  Administered 2013-11-06: 1 via RECTAL

## 2013-11-06 MED ORDER — PROMETHAZINE HCL 25 MG/ML IJ SOLN: 6.2500 mg | INTRAMUSCULAR | Status: DC | PRN

## 2013-11-06 SURGICAL SUPPLY — 27 items
BAG URINE DRAINAGE (UROLOGICAL SUPPLIES) ×1 IMPLANT
BAG URO CATCHER STRL LF (DRAPE) ×2 IMPLANT
CATH HEMA 3WAY 30CC 24FR COUDE (CATHETERS) ×1 IMPLANT
DRAPE CAMERA CLOSED 9X96 (DRAPES) ×2 IMPLANT
ELECT BUTTON HF 24-28F 2 30DE (ELECTRODE) ×2 IMPLANT
ELECT LOOP MED HF 24F 12D (CUTTING LOOP) ×2 IMPLANT
ELECT LOOP MED HF 24F 12D CBL (CLIP) ×2 IMPLANT
ELECT RESECT VAPORIZE 12D CBL (ELECTRODE) ×1 IMPLANT
GLOVE BIOGEL M STRL SZ7.5 (GLOVE) ×2 IMPLANT
GLOVE BIOGEL PI IND STRL 8.5 (GLOVE) IMPLANT
GLOVE BIOGEL PI INDICATOR 8.5 (GLOVE) ×1
GOWN PREVENTION PLUS LG XLONG (DISPOSABLE) ×2 IMPLANT
GOWN STRL REIN 2XL XLG LVL4 (GOWN DISPOSABLE) ×1 IMPLANT
GOWN STRL REIN XL XLG (GOWN DISPOSABLE) ×3 IMPLANT
HOLDER FOLEY CATH W/STRAP (MISCELLANEOUS) IMPLANT
IV NS 1000ML (IV SOLUTION) ×36
IV NS 1000ML BAXH (IV SOLUTION) IMPLANT
IV NS IRRIG 3000ML ARTHROMATIC (IV SOLUTION) ×2 IMPLANT
KIT ASPIRATION TUBING (SET/KITS/TRAYS/PACK) ×2 IMPLANT
MANIFOLD NEPTUNE II (INSTRUMENTS) ×2 IMPLANT
NS IRRIG 1000ML POUR BTL (IV SOLUTION) ×2 IMPLANT
PACK CYSTO (CUSTOM PROCEDURE TRAY) ×2 IMPLANT
SCRUB PCMX 4 OZ (MISCELLANEOUS) ×1 IMPLANT
SOL PREP POV-IOD 16OZ 10% (MISCELLANEOUS) ×1 IMPLANT
SYR 30ML LL (SYRINGE) ×1 IMPLANT
SYRINGE IRR TOOMEY STRL 70CC (SYRINGE) ×1 IMPLANT
TUBING CONNECTING 10 (TUBING) ×2 IMPLANT

## 2013-11-06 NOTE — Transfer of Care (Signed)
Immediate Anesthesia Transfer of Care Note  Patient: Brian Cortez  Procedure(s) Performed: Procedure(s) (LRB): TRANSURETHRAL RESECTION OF BLADDER TUMOR WITH GYRUS (TURBT-GYRUS) (N/A)  Patient Location: PACU  Anesthesia Type: General  Level of Consciousness: sedated, patient cooperative and responds to stimulation  Airway & Oxygen Therapy: Patient Spontanous Breathing and Patient connected to face mask oxgen  Post-op Assessment: Report given to PACU RN and Post -op Vital signs reviewed and stable  Post vital signs: Reviewed and stable  Complications: No apparent anesthesia complications

## 2013-11-06 NOTE — H&P (Signed)
Reason For Visit CT & OV for gross hematuria   Active Problems Problems  1. Benign prostatic hypertrophy without lower urinary tract symptoms (600.00) 2. Erectile dysfunction due to arterial insufficiency (607.84) 3. Gross hematuria (599.71) 4. 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 He states that it starts at the beginning of urination. The tip of his penis seems a little sensitive. Hx of BPH & hypogonadism. Currently getting Testosteone injection 300mg  every 2 weeks. Hx straddle injury as teenger, with urethral stricture. Urethral dilation has helped in the past.       Pt has failed Androgel & Axiron in the past.     02/08/13 labs: PSA - 0.72, Testosterone - 246, Hct - 48.2% and Hgb - 16.7  09/22/12 Testosterone - 519.33  06/16/12 PSA - 1.08     04/07/12 Testosterone - 415.47  09/29/11 Testosterone - 274.45  03/23/11 Testosterone - 739.94  02/02/11 PSA - 0.63  11/25/10 labs: Testosterone 35.35, SHBG - 13, Free Testosterone - 2.8%.   Past Medical History Problems  1. History of Anxiety (Symptom) (799.2) 2. History of Diastolic Congestive Heart Failure (428.30) 3. History of esophageal reflux (V12.79) 4. History of glaucoma (V12.49) 5. History of kidney stones (V13.01) 6. History of Urinary Symptoms (788.9)  Surgical History Problems  1. History of Eye Surgery 2. History of Gallbladder Surgery  Current Meds 1. Carvedilol 3.125 MG Oral Tablet;  Therapy: (Recorded:02Oct2014) to Recorded 2. Cialis 20 MG Oral Tablet; TAKE 1 TABLET PRN;  Therapy: 18Apr2012 to (Last Rx:18Jul2014)  Requested for: 18Jul2014 Ordered 3. Gabapentin TABS;  Therapy: (Recorded:30Oct2008) to Recorded 4. Latanoprost 0.005 % Ophthalmic Solution;  Therapy: 11Jun2013 to Recorded 5. Levitra 20 MG Oral Tablet; TAKE 1 TABLET PRN;  Therapy: 06Jun2012 to (Last  Rx:06Jun2012)  Requested for: 06Jun2012 Ordered 6. Lexapro TABS;  Therapy: (Recorded:13Dec2007) to Recorded 7. Lipitor TABS;  Therapy: (Recorded:30Oct2008) to Recorded 8. Lisinopril TABS;  Therapy: (Recorded:15Feb2010) to Recorded 9. Multi-Day Vitamins TABS;  Therapy: (Recorded:13Dec2007) to Recorded 10. NexIUM 20 MG Oral Capsule Delayed Release;   Therapy: (Recorded:13Dec2007) to Recorded 11. Sildenafil Citrate 20 MG Oral Tablet; Take 2-5 tablets as needed;   Therapy: 14Jul2014 to (Last Rx:14Jul2014)  Requested for: 14Jul2014 Ordered 12. Testosterone Cypionate 200 MG/ML Intramuscular Solution; INJECT 300  MG   Intramuscular EVERY 2 WKS; To Be Done: 23Dec2014; Status: HOLD FOR -   Administration Ordered 13. Testosterone Cypionate 200 MG/ML OIL; INJECT 1.5  ML Intramuscular Inject 300mg    every 2 weeks;   Therapy: 30Jul2012 to (Last Rx:16Jan2014) Ordered  Allergies Medication  1. No Known Drug Allergies  Family History Problems  1. Family history of Family Health Status Number Of Children   2 2. Family history of cardiac disorder (V17.49) : Mother, Father  Social History Problems  1. Alcohol Use   2 2. Caffeine Use   3 3. Family history of Death In The Family Father   47 4. Family history of Death In The Family Mother   43 5. Marital History - Currently Married 6. Never A Smoker 7. Occupation:   banker 8. Denied: Tobacco Use (V15.82)  Review of Systems Genitourinary, constitutional, skin, eye, otolaryngeal, hematologic/lymphatic, cardiovascular, pulmonary, endocrine, musculoskeletal, gastrointestinal, neurological and psychiatric system(s) were reviewed and pertinent findings if present are noted.  Genitourinary: nocturia and hematuria.  Constitutional: feeling tired (fatigue) insomnia.    Vitals Vital Signs [Data Includes:  Last 1 Day]  Recorded: 12Dec2014 04:17PM  Height: 6 ft 3 in Weight: 266 lb  BMI Calculated: 33.25 BSA Calculated: 2.48 Blood  Pressure: 126 / 78 Heart Rate: 66  Physical Exam Constitutional: Well nourished and well developed . No acute distress.  ENT:. The ears and nose are normal in appearance.  Neck: The appearance of the neck is normal and no neck mass is present.  Pulmonary: No respiratory distress and normal respiratory rhythm and effort.  Cardiovascular: Heart rate and rhythm are normal . No peripheral edema.  Abdomen: The abdomen is soft and nontender. No masses are palpated. No CVA tenderness. No hernias are palpable. No hepatosplenomegaly noted.  Genitourinary: Examination of the penis demonstrates no discharge, no masses, no lesions and a normal meatus. The penis is circumcised. The scrotum is without lesions. The right epididymis is palpably normal and non-tender. The left epididymis is palpably normal and non-tender. The right testis is non-tender and without masses. The left testis is non-tender and without masses.  Lymphatics: The femoral and inguinal nodes are not enlarged or tender.  Skin: Normal skin turgor, no visible rash and no visible skin lesions.  Neuro/Psych:. Mood and affect are appropriate.    Results/Data Selected Results  AU CT-HEMATURIA PROTOCOL 12Dec2014 12:00AM Jethro Bolus   Test Name Result Flag Reference  AU CT-HEMATURIA PROTOCOL (Report)    ** RADIOLOGY REPORT BY [facility] **   CLINICAL DATA: 62 year old with gross hematuria.  EXAM: CT ABDOMEN AND PELVIS WITHOUT AND WITH CONTRAST  TECHNIQUE: Multidetector CT imaging of the abdomen and pelvis was performed without contrast material in one or both body regions, followed by contrast material(s) and further sections in one or both body regions.  CONTRAST: 125 ml Isovue  COMPARISON: 10/14/2006  FINDINGS: Linear density in the right lower lobe is consistent with atelectasis or scarring. Otherwise, the lung bases are clear. No evidence for free air.  The gallbladder has been removed. Normal  appearance of the liver, portal venous system, spleen, pancreas and adrenal glands. Incidentally, there are duodenum diverticula near the ampulla. The appendix is slightly prominent, measuring roughly 8 mm in diameter and there is very mild stranding near the appendix. No evidence for an appendicolith. No gross abnormality to the small bowel.  Negative for kidney stone or hydronephrosis. No evidence for a renal parenchymal lesion. Again noted is a diverticulum along the left side of the posterior bladder. There is a new enhancing lesion along the left posterior wall of the bladder that measures up to 4.0 cm. Findings are concerning for a bladder neoplasm. No gross abnormality to the prostate or seminal vesicles. No significant free fluid or lymphadenopathy within the abdomen or pelvis. Both ureters are well opacified with contrast. No evidence for a lesion in the upper renal collecting system.  No acute bone abnormality.  IMPRESSION: 4.0 cm enhancing lesion along the left posterior bladder wall. Findings are concerning for a bladder neoplasm.  Negative for kidney stones.  Chronic bladder diverticulum.  The appendix is prominent for size and there may be mild stranding in this area. Findings do not clearly represent acute appendicitis but recommend clinical correlation in this area.  Post cholecystectomy.  These results were called by telephone at the time of interpretation on 11/02/2013 at 4:30 PM to Dr. Jethro Bolus , who verbally acknowledged these results.   Electronically Signed  By: Richarda Overlie M.D.  On: 11/02/2013 16:34   TESTOSTERONE 20Mar2014 02:13PM Jethro Bolus  SPECIMEN TYPE: BLOOD   Test  Name Result Flag Reference  TESTOSTERONE, TOTAL 246 ng/dL L 161-096  TANNER STAGE       MALE              MALE               I              < 30 NG/DL        < 10 NG/DL               II             < 150 NG/DL       < 30 NG/DL               III             100-320 NG/DL     < 35 NG/DL               IV             200-970 NG/DL     04-54 NG/DL               V/ADULT        300-890 NG/DL     09-81 NG/DL   PSA [id number] 62:13YQ Jethro Bolus  SPECIMEN TYPE: BLOOD   Test Name Result Flag Reference  PSA 0.72 ng/mL  <=4.00  TEST METHODOLOGY: ECLIA PSA (ELECTROCHEMILUMINESCENCE IMMUNOASSAY)   HEMOGLOBIN & HEMATOCRIT 20Mar2014 02:12PM Jethro Bolus  SPECIMEN TYPE: BLOOD   Test Name Result Flag Reference  HEMATOCRIT 48.2 %  39.0-52.0  HEMOGLOBIN 16.7 g/dL  65.7-84.6   Procedure  Procedure: Cystoscopy  Chaperone Present: kim lewis.  Indication: Hematuria. Bladder Mass.  Informed Consent: Risks, benefits, and potential adverse events were discussed and informed consent was obtained from the patient.  Prep: The patient was prepped with betadine.  Anesthesia:. Local anesthesia was administered intraurethrally with 2% lidocaine jelly.  Antibiotic prophylaxis: Ciprofloxacin.  Procedure Note:  Urethral meatus:. No abnormalities.  Anterior urethra: No abnormalities.  Prostatic urethra: No abnormalities.  Bladder: Visulization was clear. The ureteral orifices were in the normal anatomic position bilaterally. Examination of the bladder demonstrated no clot within the bladder, no trabeculation and no diverticulum no erythematous mucosa, no edema and no cellules. A solitary tumor was visualized in the bladder. A papillary tumor was seen in the bladder measuring approximately 4 cm in size. The patient tolerated the procedure well.  Complications: None.    Assessment Assessed  1. Gross hematuria (599.71) 2. 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   Plan Gross hematuria  1. Cysto; Status:Complete;   Done: 12Dec2014  Schedule cysto, TUR-BT next week.   Discussed possibility of BCG, depending on grade, stage of tumor.   Discussion/Summary cc: Dr. Bartholome Bill Electronically signed by : Jethro Bolus,  M.D.; Nov 02 2013  6:00PM EST

## 2013-11-06 NOTE — Preoperative (Signed)
Beta Blockers   Reason not to administer Beta Blockers:Not Applicable 

## 2013-11-06 NOTE — Interval H&P Note (Signed)
History and Physical Interval Note:  11/06/2013 5:09 PM  Brian Cortez  has presented today for surgery, with the diagnosis of Left Bladder Wall Tumor  The various methods of treatment have been discussed with the patient and family. After consideration of risks, benefits and other options for treatment, the patient has consented to  Procedure(s): TRANSURETHRAL RESECTION OF BLADDER TUMOR WITH GYRUS (TURBT-GYRUS) (N/A) as a surgical intervention .  The patient's history has been reviewed, patient examined, no change in status, stable for surgery.  I have reviewed the patient's chart and labs.  Questions were answered to the patient's satisfaction.     Jethro Bolus I

## 2013-11-06 NOTE — Op Note (Signed)
Pre-operative diagnosis :   Left bladder wall bladder tumor  Postoperative diagnosis:  8 cm left lateral wall bladder tumor  Operation:  Cystourethroscopy, transurethral resection of left bladder wall bladder tumor  Surgeon:  S. Patsi Sears, MD  First assistant:  None  Anesthesia:  General LMA  Preparation:  After appropriate preanesthesia, the patient was brought the operative room, placed on the operating table in the dorsal supine position where general LMA anesthesia was introduced. He was replaced in the dorsal lithotomy position with pubis was prepped with Betadine solution and draped in usual fashion. The history was reviewed, the CT was reviewed. The arm band was double checked.  Review history:  Problems  1. Benign prostatic hypertrophy without lower urinary tract symptoms (600.00)  2. Erectile dysfunction due to arterial insufficiency (607.84)  3. Gross hematuria (599.71)  4. 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 Urethral stricture (598.9)  History of Present Illness  62 yo male returns today because of asymptomatic gross hematuria that started last week. He states that it starts at the beginning of urination. The tip of his penis seems a little sensitive. Hx of BPH & hypogonadism. Currently getting Testosteone injection 300mg  every 2 weeks. Hx straddle injury as teenger, with urethral stricture. Urethral dilation has helped in the past.  Pt has failed Androgel & Axiron in the past.  02/08/13 labs: PSA - 0.72, Testosterone - 246, Hct - 48.2% and Hgb - 16.7  09/22/12 Testosterone - 519.33  06/16/12 PSA - 1.08  04/07/12 Testosterone - 415.47  09/29/11 Testosterone - 274.45  03/23/11 Testosterone - 739.94  02/02/11 PSA - 0.63  11/25/10 labs: Testosterone 35.35, SHBG - 13, Free Testosterone - 2.8%.  Past Medical History  Problems  1. History of Anxiety (Symptom) (799.2)  2. History of Diastolic Congestive Heart Failure (428.30)  3. History of esophageal reflux (V12.79)  4. History of  glaucoma (V12.49)  5. History of kidney stones (V13.01)  6. History of Urinary Symptoms (788.9)   Statement of  Likelihood of Success: Excellent. TIME-OUT observed.:  Procedure:  Examination of the external penile meatus is normal meatus normal glans. There was a sub-meatal stricture, which was dilated to a size 97 [place] with the male sounds. Following this, the continuous flow resectoscope sheath was placed in the bladder without,, the bladder is drained of fluid. Blood clots were noted within the urine. Cystoscopy was accomplished, which showed some prostate enlargement, but otherwise normal prostate. The bladder neck was normal. The bladder itself showed normal trigone. However, just above the left ureteral orifice, the left bladder wall bladder tumor was noted to arise, and project throughout the entire left bladder wall, and up to the top of the left bladder wall. The bladder tumor mass measured 8 cm 5 volume. Concern was for irritation of the obturator nerve, and it was kept on the left leg at all times.  Resection of the bladder tumor was accomplished in multiple fractions, and tissue was evacuated from the bladder with the piston syringe on 3 different occasions, and sent to laboratory for examination for diagnosis, and depth of invasion. Extensive cauterization of the left bladder wall was accomplished, in order to afford hemostasis. Following evacuation of all tissue, a size 24 three-way Foley catheter was placed in the bladder, with 30 cc in the balloon. No traction was necessary. No bleeding was noted. The patient did not need continuous irrigation. He received 30 mg of IV Toradol. He will receive IV Tylenol. The patient received 3 g of IV Ancef  per anesthesia.  He received a B. and O. suppository at the beginning of the procedure. At the end of procedure, he was awakened, and taken to recovery room in good condition.

## 2013-11-06 NOTE — Anesthesia Postprocedure Evaluation (Signed)
  Anesthesia Post-op Note  Patient: Brian Cortez  Procedure(s) Performed: Procedure(s) (LRB): TRANSURETHRAL RESECTION OF BLADDER TUMOR WITH GYRUS (TURBT-GYRUS) (N/A)  Patient Location: PACU  Anesthesia Type: General  Level of Consciousness: awake and alert   Airway and Oxygen Therapy: Patient Spontanous Breathing  Post-op Pain: mild  Post-op Assessment: Post-op Vital signs reviewed, Patient's Cardiovascular Status Stable, Respiratory Function Stable, Patent Airway and No signs of Nausea or vomiting  Last Vitals:  Filed Vitals:   11/06/13 2054  BP: 131/77  Pulse: 74  Temp: 36.7 C  Resp: 19    Post-op Vital Signs: stable   Complications: No apparent anesthesia complications

## 2013-11-06 NOTE — Anesthesia Preprocedure Evaluation (Signed)
Anesthesia Evaluation  Patient identified by MRN, date of birth, ID band Patient awake    Reviewed: Allergy & Precautions, H&P , NPO status , Patient's Chart, lab work & pertinent test results  Airway Mallampati: III TM Distance: >3 FB Neck ROM: Full    Dental no notable dental hx.    Pulmonary sleep apnea and Continuous Positive Airway Pressure Ventilation ,  breath sounds clear to auscultation  Pulmonary exam normal       Cardiovascular hypertension, Pt. on medications - CAD Rhythm:Regular Rate:Normal     Neuro/Psych negative neurological ROS  negative psych ROS   GI/Hepatic negative GI ROS, Neg liver ROS, GERD-  Medicated and Controlled,Gilbert's syndrome    Endo/Other  negative endocrine ROS  Renal/GU negative Renal ROS  negative genitourinary   Musculoskeletal negative musculoskeletal ROS (+)   Abdominal   Peds negative pediatric ROS (+)  Hematology negative hematology ROS (+)   Anesthesia Other Findings   Reproductive/Obstetrics negative OB ROS                           Anesthesia Physical Anesthesia Plan  ASA: III  Anesthesia Plan: General   Post-op Pain Management:    Induction: Intravenous  Airway Management Planned: LMA  Additional Equipment:   Intra-op Plan:   Post-operative Plan: Extubation in OR  Informed Consent: I have reviewed the patients History and Physical, chart, labs and discussed the procedure including the risks, benefits and alternatives for the proposed anesthesia with the patient or authorized representative who has indicated his/her understanding and acceptance.   Dental advisory given  Plan Discussed with: CRNA  Anesthesia Plan Comments:         Anesthesia Quick Evaluation

## 2013-11-07 ENCOUNTER — Encounter (HOSPITAL_COMMUNITY): Payer: Self-pay | Admitting: Urology

## 2013-11-07 MED ORDER — CHLORPROMAZINE HCL 10 MG PO TABS
10.0000 mg | ORAL_TABLET | Freq: Three times a day (TID) | ORAL | Status: DC | PRN
  Administered 2013-11-07: 21:00:00 10 mg via ORAL
  Filled 2013-11-07: qty 1

## 2013-11-07 MED ORDER — MENTHOL 3 MG MT LOZG
1.0000 | LOZENGE | OROMUCOSAL | Status: DC | PRN
  Filled 2013-11-07: qty 9

## 2013-11-07 MED ORDER — CHLORPROMAZINE HCL 10 MG PO TABS
10.0000 mg | ORAL_TABLET | Freq: Three times a day (TID) | ORAL | Status: DC
  Administered 2013-11-07: 10 mg via ORAL
  Filled 2013-11-07 (×2): qty 1

## 2013-11-07 MED ORDER — PHENOL 1.4 % MT LIQD
1.0000 | OROMUCOSAL | Status: DC | PRN
  Filled 2013-11-07: qty 177

## 2013-11-07 NOTE — Progress Notes (Signed)
Upon arrival patient has self administered CPAP, tolerating well--verbalizes understanding to call if any further assistance needed.

## 2013-11-07 NOTE — Progress Notes (Signed)
Placed pt on CPAP 10cmH2O and sterile water was added to the humidity chamber. Order states to bleed 2.5lpm of oxygen into the machine however the pt states he does not use oxygen at home. Pt is using home nasal mask and tubing. Pt is comfortable and tolerating CPAP well at this time with SpO2 92%. RT will continue to monitor as needed.

## 2013-11-07 NOTE — Progress Notes (Signed)
1 Day Post-Op: post TUR-BT, L Lateral wall Bladder Tumor, 8cm.  Subjective: Patient reports no pain. Minimal bleeding. CBI continues. Medical [problems: 1. Bladder cancer: path pending: plan to stop cbi, and hopefully take foleyout in AM.  2. Sleep Apnea: Had respiratory therapy consult last night: worked out C-pap, and pt able to rest 3. Heart Failure: New diagnosis per Dr. Alwyn Ren. Remains on Telemetry. No edema today. Breathing well.  4. Urethral stricture: hxofstraddle injury, dilated in OR last night. Has 24 F foley cath in place.  5. DM-II ?? 6. Gilbert's syndrome 7. HBP.   Objective: Vital signs in last 24 hours: Temp:  [97.6 F (36.4 C)-98.5 F (36.9 C)] 97.6 F (36.4 C) (12/17 0656) Pulse Rate:  [54-74] 54 (12/17 0656) Resp:  [18-20] 18 (12/17 0656) BP: (107-135)/(61-78) 124/66 mmHg (12/17 0656) SpO2:  [91 %-100 %] 91 % (12/17 0656) Weight:  [121.201 kg (267 lb 3.2 oz)] 121.201 kg (267 lb 3.2 oz) (12/16 1456)  Intake/Output from previous day: 12/16 0701 - 12/17 0700 In: 13132.5 [P.O.:600; I.V.:1902.5] Out: 16109 [Urine:12100] Intake/Output this shift: Total I/O In: 480 [P.O.:480] Out: 1375 [Urine:1375]  Physical Exam:  General:alert and cooperative GI: not done and soft, non tender, normal bowel sounds, no palpable masses, no organomegaly, no inguinal hernia Male genitalia: not done Urethral Meatus: normal Bladder: no bladder distension noted Extremities: extremities normal, atraumatic, no cyanosis or edema  Lab Results:  Recent Labs  11/06/13 1515  HGB 16.7  HCT 49.6   BMET  Recent Labs  11/06/13 1515  NA 136  K 4.3  CL 99  CO2 28  GLUCOSE 86  BUN 16  CREATININE 1.17  CALCIUM 9.3   No results found for this basename: LABPT, INR,  in the last 72 hours No results found for this basename: LABURIN,  in the last 72 hours No results found for this or any previous visit.  Studies/Results: No results found.  Assessment/Plan: Continue foley due to  hematuria and [post op.  Will hopefully remove foley in AM.  D/c planning for AM.  Shower and walk today.    LOS: 1 day   Soley Harriss I 11/07/2013, 8:32 AM

## 2013-11-08 MED ORDER — LORATADINE 10 MG PO TABS: 10.0000 mg | ORAL_TABLET | Freq: Every day | ORAL | Status: DC | PRN

## 2013-11-08 MED ORDER — OXYCODONE-ACETAMINOPHEN 5-325 MG PO TABS: 1.0000 | ORAL_TABLET | Freq: Four times a day (QID) | ORAL | Status: DC | PRN

## 2013-11-08 MED ORDER — TRIMETHOPRIM 100 MG PO TABS: 100.0000 mg | ORAL_TABLET | ORAL | Status: DC

## 2013-11-08 MED ORDER — PHENAZOPYRIDINE HCL 200 MG PO TABS: 200.0000 mg | ORAL_TABLET | Freq: Three times a day (TID) | ORAL | Status: DC | PRN

## 2013-11-08 MED ORDER — TAMSULOSIN HCL 0.4 MG PO CAPS: 0.4000 mg | ORAL_CAPSULE | Freq: Every day | ORAL | Status: DC

## 2013-11-08 NOTE — Progress Notes (Signed)
2 Days Post-Op Subjective: Patient reports bloody urine, but no clots. Feels well. NO BM, but eating well. No spasms.   Objective: Vital signs in last 24 hours: Temp:  [97.5 F (36.4 C)-98.1 F (36.7 C)] 97.5 F (36.4 C) (12/18 0530) Pulse Rate:  [59-64] 63 (12/18 0530) Resp:  [16-18] 18 (12/18 0530) BP: (88-110)/(54-65) 104/64 mmHg (12/18 0530) SpO2:  [95 %-96 %] 96 % (12/18 0530)  Intake/Output from previous day: 12/17 0701 - 12/18 0700 In: 19506.3 [P.O.:1560; I.V.:946.3] Out: 16109 [Urine:22900] Intake/Output this shift:    Physical Exam:  General:alert, cooperative and no distress GI: not done and soft, non tender, normal bowel sounds, no palpable masses, no organomegaly, no inguinal hernia Male genitalia: not done no bladder distension noted foley urine bloody, no clots. CBI disconnected  Lab Results:  Recent Labs  11/06/13 1515  HGB 16.7  HCT 49.6   BMET  Recent Labs  11/06/13 1515  NA 136  K 4.3  CL 99  CO2 28  GLUCOSE 86  BUN 16  CREATININE 1.17  CALCIUM 9.3   No results found for this basename: LABPT, INR,  in the last 72 hours No results found for this basename: LABURIN,  in the last 72 hours No results found for this or any previous visit.  Studies/Results: No results found.  Assessment/Plan: Continue foley due to gross hematuria post op [place] D/c today with overnight bag and leg bag. RTC in Am for catheter d/c.    LOS: 2 days   Kennith Morss I 11/08/2013, 9:10 AM

## 2013-11-08 NOTE — Discharge Summary (Signed)
Physician Discharge Summary  Patient ID: Brian Cortez MRN: [id number] DOB/AGE: November 21, 1951 62 y.o.  Admit date: 11/06/2013 Discharge date: 11/08/2013  Admission Diagnoses: Left Bladder Wall Tumor  Discharge Diagnoses:  Active Problems:   Bladder cancer   Discharged Condition: good  Hospital Course: n TUR-BT.  Pathology Pending at discharge  Consults: None  Significant Diagnostic Studies: No results found.  Treatments: IV hydration, antibiotics: Ancef, analgesia: acetaminophen, respiratory therapy: O2 and surgery:   Discharge Exam: Blood pressure 104/64, pulse 63, temperature 97.5 F (36.4 C), temperature source Oral, resp. rate 18, height 6\' 4"  (1.93 m), weight 121.201 kg (267 lb 3.2 oz), SpO2 96.00%. General appearance: alert, cooperative and gross hematuria wiothout clots  Disposition: 01-Home or Self Care. Foley irrigated clear prior to discharge.  RTC in AM for foley removal.  Discharge Orders   Future Appointments Provider Department Dept Phone   08/27/2014 3:00 PM Melvyn Novas, MD [facility] [phone]   Future Orders Complete By Expires   Call MD for:  As directed    Scheduling Instructions:     Urine retention 2ndary clots in catheter.   Continue foley catheter  As directed    Diet - low sodium heart healthy  As directed    Discharge instructions  As directed    Comments:     1. Ok to shower 2. Learn to empty both leg bag and overnight bag.  3. Push fluids   Discharge patient  As directed    Discontinue IV  As directed    Driving Restrictions  As directed    Comments:     No driving until cleared post op   Increase activity slowly  As directed    Lifting restrictions  As directed    Comments:     10 lbs until cleared post op   Patient may shower  As directed        Medication List         AMBULATORY NON FORMULARY MEDICATION  CPAP Machine     aspirin 81 MG tablet  Take 81 mg by mouth every evening.     atorvastatin 40 MG tablet  Commonly known as:  LIPITOR  Take 40 mg by mouth every evening.     carvedilol 6.25 MG tablet  Commonly known as:  COREG  Take 1 tablet (6.25 mg total) by mouth 2 (two) times daily.     CENTRUM SILVER PO  Take 1 tablet by mouth every morning.     ciprofloxacin 500 MG tablet  Commonly known as:  CIPRO  Take 500 mg by mouth 2 (two) times daily.     escitalopram 20 MG tablet  Commonly known as:  LEXAPRO  Take 20 mg by mouth daily.     esomeprazole 40 MG capsule  Commonly known as:  NEXIUM  Take 40 mg by mouth 2 (two) times daily.     Fish Oil 1000 MG Caps  Take 1,000 mg by mouth 2 (two) times daily.     latanoprost 0.005 % ophthalmic solution  Commonly known as:  XALATAN  Place 1 drop into both eyes every morning.     lisinopril 20 MG tablet  Commonly known as:  PRINIVIL,ZESTRIL  Take 20 mg by mouth every morning.     loratadine 10 MG tablet  Commonly known as:  CLARITIN  Take 10 mg by mouth daily as needed for allergies.     oxyCODONE-acetaminophen 5-325 MG per tablet  Commonly known as:  ROXICET  Take 1 tablet by mouth every 6 (six) hours as needed for severe pain.     phenazopyridine 200 MG tablet  Commonly known as:  PYRIDIUM  Take 1 tablet (200 mg total) by mouth 3 (three) times daily as needed for pain.     tamsulosin 0.4 MG Caps capsule  Commonly known as:  FLOMAX  Take 1 capsule (0.4 mg total) by mouth daily.     testosterone cypionate 100 MG/ML injection  Commonly known as:  DEPOTESTOTERONE CYPIONATE  Inject 300 mg into the muscle every 14 (fourteen) days. For IM use only     trimethoprim 100 MG tablet  Commonly known as:  TRIMPEX  Take 1 tablet (100 mg total) by mouth 1 day or 1 dose.           Follow-up Information   Follow up with Logen Heintzelman I, MD In 1 day. (for foley removal and review of pathology)    Specialty:  Urology   Contact information:   [address] [place] [address] [phone]       Signed: Jethro Bolus I 11/08/2013, 9:18 AM

## 2013-11-10 ENCOUNTER — Encounter: Payer: Self-pay | Admitting: Internal Medicine

## 2013-12-02 ENCOUNTER — Emergency Department (INDEPENDENT_AMBULATORY_CARE_PROVIDER_SITE_OTHER)
Admission: EM | Admit: 2013-12-02 | Discharge: 2013-12-02 | Disposition: A | Discharge status: Home / Self Care | Payer: 59 | Source: Home / Self Care | Attending: Family Medicine | Admitting: Family Medicine

## 2013-12-02 ENCOUNTER — Encounter (HOSPITAL_COMMUNITY): Payer: Self-pay | Admitting: Emergency Medicine

## 2013-12-02 DIAGNOSIS — J069 Acute upper respiratory infection, unspecified: Secondary | ICD-10-CM

## 2013-12-02 LAB — POCT RAPID STREP A: Streptococcus, Group A Screen (Direct): NEGATIVE

## 2013-12-02 MED ORDER — HYDROCOD POLST-CHLORPHEN POLST 10-8 MG/5ML PO LQCR: 5.0000 mL | Freq: Two times a day (BID) | ORAL | Status: DC | PRN

## 2013-12-02 MED ORDER — IPRATROPIUM BROMIDE 0.06 % NA SOLN: 2.0000 | Freq: Four times a day (QID) | NASAL | Status: DC

## 2013-12-02 NOTE — ED Notes (Signed)
C/O bilat earache, sore throat, non-productive cough without fevers x2 days.  Denies n/v.  Uvula and oropharynx swollen.  Has been taking Robitussin and ASA.  Pt recently underwent bladder cancer surgery 12/17 - is about to start BCG treatment.  BBS clear.

## 2013-12-02 NOTE — Discharge Instructions (Signed)
Drink plenty of fluids as discussed, use medicine as prescribed, and mucinex or delsym for cough. Return or see your doctor if further problems °

## 2013-12-02 NOTE — ED Provider Notes (Signed)
CSN: [id number]     Arrival date & time 12/02/13  4854 History   First MD Initiated Contact with Patient 12/02/13 1044     Chief Complaint  Patient presents with  . URI   (Consider location/radiation/quality/duration/timing/severity/associated sxs/prior Treatment) Patient is a 63 y.o. male presenting with URI. The history is provided by the patient.  URI Presenting symptoms: congestion, cough, rhinorrhea and sore throat   Presenting symptoms: no fever   Severity:  Mild Onset quality:  Gradual Duration:  2 days Progression:  Unchanged Chronicity:  New Worsened by:  Nothing tried Ineffective treatments:  None tried Associated symptoms: no swollen glands and no wheezing     Past Medical History  Diagnosis Date  . Hyperlipidemia   . Hyperglycemia 2001    w/ normal A1c   . Liver enzyme elevation 2004  . Gilbert syndrome   . Colonic polyp     adenomatous, PMH of Dr Henrene Pastor  . Diverticulosis   . Urethral stricture     surgery ; Dr Reece Agar  . Testosterone deficiency   . Anxiety     PMH of  . Obesity   . Carpal tunnel syndrome     LUE  . DDD (degenerative disc disease), cervical   . GERD (gastroesophageal reflux disease)   . Sleep apnea, obstructive 02/20/2013    CPAP since 12-2012 . 9 cm water , respicare .  Marland Kitchen Hypertension   . Diastolic heart failure     controlled with carvedilol per pt  . Bladder cancer    Past Surgical History  Procedure Laterality Date  . Polypectomy  2007 & 2012    X2 ; Dr Henrene Pastor  . Cystoscopy      X2  . Urethral dilation      secondary to trauma; Dr Reece Agar  . Tonsillectomy    . Vasectomy    . Cholecystectomy  11/07  . Varicose vein surgery  2002  . Facial fracture surgery  1987    blow out fracture & occipital fracture from basketball injuly   . Cataract extraction, bilateral  2&01/2012  . Orbital fracture surgery      left eye: 1986  . Transurethral resection of bladder tumor with gyrus (turbt-gyrus) N/A 11/06/2013    Procedure:  TRANSURETHRAL RESECTION OF BLADDER TUMOR WITH GYRUS (TURBT-GYRUS);  Surgeon: Ailene Rud, MD;  Location: [facility];  Service: Urology;  Laterality: N/A;   Family History  Problem Relation Age of Onset  . Heart attack Father 59  . Stroke Father 32  . Heart attack Paternal Grandfather     in 41s  . Prostate cancer Paternal Uncle   . Heart attack Paternal Uncle      > 55  . Colon cancer Neg Hx   . Diabetes Neg Hx   . Heart attack Maternal Uncle 80    > 55   History  Substance Use Topics  . Smoking status: Never Smoker   . Smokeless tobacco: Never Used  . Alcohol Use: 0.0 oz/week     Comment: occasional    Review of Systems  Constitutional: Negative.  Negative for fever.  HENT: Positive for congestion, rhinorrhea and sore throat.   Respiratory: Positive for cough. Negative for shortness of breath and wheezing.   Cardiovascular: Negative.     Allergies  Pollen extract  Home Medications   Current Outpatient Rx  Name  Route  Sig  Dispense  Refill  . AMBULATORY NON FORMULARY MEDICATION      CPAP Machine         .  aspirin 81 MG tablet   Oral   Take 81 mg by mouth every evening.          Marland Kitchen atorvastatin (LIPITOR) 40 MG tablet   Oral   Take 40 mg by mouth every evening.         . carvedilol (COREG) 6.25 MG tablet   Oral   Take 1 tablet (6.25 mg total) by mouth 2 (two) times daily.   60 tablet   6   . escitalopram (LEXAPRO) 20 MG tablet   Oral   Take 20 mg by mouth daily.         Marland Kitchen esomeprazole (NEXIUM) 40 MG capsule   Oral   Take 40 mg by mouth 2 (two) times daily.          Marland Kitchen latanoprost (XALATAN) 0.005 % ophthalmic solution   Both Eyes   Place 1 drop into both eyes every morning.          Marland Kitchen lisinopril (PRINIVIL,ZESTRIL) 20 MG tablet   Oral   Take 20 mg by mouth every morning.          . loratadine (CLARITIN) 10 MG tablet   Oral   Take 10 mg by mouth daily as needed for allergies.         . Multiple Vitamins-Minerals (CENTRUM  SILVER PO)   Oral   Take 1 tablet by mouth every morning.          . Omega-3 Fatty Acids (FISH OIL) 1000 MG CAPS   Oral   Take 1,000 mg by mouth 2 (two) times daily.         . tamsulosin (FLOMAX) 0.4 MG CAPS capsule   Oral   Take 1 capsule (0.4 mg total) by mouth daily.   30 capsule   0   . testosterone cypionate (DEPOTESTOTERONE CYPIONATE) 100 MG/ML injection   Intramuscular   Inject 300 mg into the muscle every 14 (fourteen) days. For IM use only         . trimethoprim (TRIMPEX) 100 MG tablet   Oral   Take 1 tablet (100 mg total) by mouth 1 day or 1 dose.   30 tablet   1   . chlorpheniramine-HYDROcodone (TUSSIONEX PENNKINETIC ER) 10-8 MG/5ML LQCR   Oral   Take 5 mLs by mouth every 12 (twelve) hours as needed for cough.   115 mL   1   . ipratropium (ATROVENT) 0.06 % nasal spray   Nasal   Place 2 sprays into the nose 4 (four) times daily.   15 mL   1   . oxyCODONE-acetaminophen (ROXICET) 5-325 MG per tablet   Oral   Take 1 tablet by mouth every 6 (six) hours as needed for severe pain.   30 tablet   0   . phenazopyridine (PYRIDIUM) 200 MG tablet   Oral   Take 1 tablet (200 mg total) by mouth 3 (three) times daily as needed for pain.   30 tablet   3    BP 150/80  Pulse 72  Temp(Src) 99.2 F (37.3 C) (Oral)  Resp 23  SpO2 96% Physical Exam  Nursing note and vitals reviewed. Constitutional: He is oriented to person, place, and time. He appears well-developed and well-nourished. No distress.  HENT:  Head: Normocephalic.  Right Ear: External ear normal.  Left Ear: External ear normal.  Mouth/Throat: Oropharynx is clear and moist.  Eyes: Pupils are equal, round, and reactive to light.  Neck: Normal range of motion.  Neck supple.  Cardiovascular: Regular rhythm and intact distal pulses.   Pulmonary/Chest: Effort normal and breath sounds normal.  Lymphadenopathy:    He has no cervical adenopathy.  Neurological: He is alert and oriented to person,  place, and time.  Skin: Skin is warm and dry.    ED Course  Procedures (including critical care time) Labs Review Labs Reviewed  POCT RAPID STREP A ([facility] CARE ONLY)   Imaging Review No results found.  EKG Interpretation    Date/Time:    Ventricular Rate:    PR Interval:    QRS Duration:   QT Interval:    QTC Calculation:   R Axis:     Text Interpretation:              MDM  Strep neg.    Billy Fischer, MD 12/02/13 [phone]

## 2013-12-03 ENCOUNTER — Encounter: Payer: Self-pay | Admitting: Neurology

## 2013-12-04 LAB — CULTURE, GROUP A STREP

## 2013-12-06 ENCOUNTER — Encounter: Payer: Self-pay | Admitting: Neurology

## 2013-12-11 NOTE — Telephone Encounter (Signed)
Dear Brian Cortez,   I am sorry to hear about the recent diagnosis, and hope sincerely that all will be going well.  Dr Brian Cortez will be missed, and  I wished for selfish reasons him not to retire before I do!    I am happy to help you with a new physician in town - My recommendation was the office of  [facility] on [address].   Dr Brian Cortez , Brian Cortez, [place] and [place].  You will not be disappointed .  Brian Walkowiak, MD

## 2013-12-17 NOTE — Telephone Encounter (Signed)
Thank you for the compliment. Good luck ,good health and success in the new Year . Darius Lundberg, MD

## 2014-01-10 ENCOUNTER — Encounter: Payer: Self-pay | Admitting: Cardiology

## 2014-01-14 ENCOUNTER — Encounter: Payer: Self-pay | Admitting: Internal Medicine

## 2014-01-14 ENCOUNTER — Other Ambulatory Visit: Payer: Self-pay | Admitting: Internal Medicine

## 2014-01-15 NOTE — Telephone Encounter (Signed)
Pt states his insurance will no longer pay for his Nexium. Pt takes 40mg  BID for GERD. Can we switch him to a different PPI. Please advise.

## 2014-01-16 ENCOUNTER — Telehealth: Payer: Self-pay

## 2014-01-16 NOTE — Telephone Encounter (Signed)
Pantoprazole 40 mg daily. This is equivalent. Let the patient know. Thank you

## 2014-01-16 NOTE — Telephone Encounter (Signed)
Dr Henrene Pastor,  Patient is currently on Nexium 40 mg daily. His insurance will now only cover Prilosec, Protonix, Aciphex or Dexilant. Patient has tried Prilosec in the past (per 1999 EGD report). Other than that, it appears that he has only tried Nexium (since at least 2008). Dr Henrene Pastor, please advise.

## 2014-01-16 NOTE — Telephone Encounter (Signed)
Left message to call back  

## 2014-01-16 NOTE — Telephone Encounter (Signed)
Left message regarding Nexium

## 2014-01-18 MED ORDER — PANTOPRAZOLE SODIUM 40 MG PO TBEC: 40.0000 mg | DELAYED_RELEASE_TABLET | Freq: Every day | ORAL | Status: DC

## 2014-01-18 NOTE — Telephone Encounter (Signed)
Rx for pantoprazole sent. Patient's wife (emergency contact) advised.

## 2014-03-15 DIAGNOSIS — K635 Polyp of colon: Secondary | ICD-10-CM | POA: Insufficient documentation

## 2014-03-15 DIAGNOSIS — F419 Anxiety disorder, unspecified: Secondary | ICD-10-CM | POA: Insufficient documentation

## 2014-03-15 DIAGNOSIS — H409 Unspecified glaucoma: Secondary | ICD-10-CM | POA: Insufficient documentation

## 2014-03-15 DIAGNOSIS — E291 Testicular hypofunction: Secondary | ICD-10-CM | POA: Insufficient documentation

## 2014-03-15 DIAGNOSIS — I5189 Other ill-defined heart diseases: Secondary | ICD-10-CM | POA: Insufficient documentation

## 2014-03-15 DIAGNOSIS — Z1331 Encounter for screening for depression: Secondary | ICD-10-CM | POA: Insufficient documentation

## 2014-04-08 ENCOUNTER — Other Ambulatory Visit: Payer: Self-pay

## 2014-04-08 ENCOUNTER — Other Ambulatory Visit: Payer: Self-pay | Admitting: Cardiology

## 2014-04-08 DIAGNOSIS — I2510 Atherosclerotic heart disease of native coronary artery without angina pectoris: Secondary | ICD-10-CM

## 2014-04-08 DIAGNOSIS — E782 Mixed hyperlipidemia: Secondary | ICD-10-CM

## 2014-04-08 MED ORDER — CARVEDILOL 6.25 MG PO TABS
6.2500 mg | ORAL_TABLET | Freq: Two times a day (BID) | ORAL | Status: DC
Start: 2014-04-08 — End: 2014-11-12

## 2014-06-13 ENCOUNTER — Other Ambulatory Visit: Payer: Self-pay | Admitting: Internal Medicine

## 2014-06-23 ENCOUNTER — Encounter: Payer: Self-pay | Admitting: Cardiology

## 2014-06-23 DIAGNOSIS — E785 Hyperlipidemia, unspecified: Secondary | ICD-10-CM

## 2014-06-23 DIAGNOSIS — I2510 Atherosclerotic heart disease of native coronary artery without angina pectoris: Secondary | ICD-10-CM

## 2014-07-10 NOTE — Telephone Encounter (Signed)
Message copied by Katrine Coho on Wed Jul 10, 2014  7:40 AM ------      Message from: Larey Dresser      Created: Mon Jul 08, 2014  9:10 PM       Brian Cortez is going to try Crestor 5 mg daily.  Please send in with lipids/LFTs in 2 months.  ------

## 2014-07-12 ENCOUNTER — Other Ambulatory Visit: Payer: Self-pay | Admitting: *Deleted

## 2014-07-12 MED ORDER — ROSUVASTATIN CALCIUM 5 MG PO TABS: 5.0000 mg | ORAL_TABLET | Freq: Every day | ORAL | Status: DC

## 2014-07-13 ENCOUNTER — Other Ambulatory Visit: Payer: Self-pay | Admitting: Internal Medicine

## 2014-08-27 ENCOUNTER — Encounter: Payer: Self-pay | Admitting: Neurology

## 2014-08-27 ENCOUNTER — Ambulatory Visit (INDEPENDENT_AMBULATORY_CARE_PROVIDER_SITE_OTHER): Payer: 59 | Admitting: Neurology

## 2014-08-27 VITALS — BP 114/65 | HR 78 | Temp 99.0°F | Resp 20 | Ht 76.5 in | Wt 279.0 lb

## 2014-08-27 DIAGNOSIS — J310 Chronic rhinitis: Secondary | ICD-10-CM

## 2014-08-27 NOTE — Progress Notes (Signed)
[facility]  Provider:  Larey Seat, M D  Referring Provider: Hendricks Limes, MD Primary Care Physician:  Tivis Ringer, MD  Chief Complaint  Patient presents with  . osa post cpap    RM 11    HPI:  Brian Cortez is a 63 y.o. male  Is seen here as a revisit  from Dr. Dagmar Hait , for follow up on CPAP compliance in the treatment of OSA. Diagnosed in a HST  On 11-14-12 , AHI was  Reduced to 2.2 . Compliance  100% , 8 hours and 12 , 8 cm water with 2 cm EPR, residual AHI 2.2 , in office download.  Fatigue and lightheadedness were improved, until last month. He has no longer nocturia since being on CPAP.  He went to the ED and was diagnosed with diastolic heart failure, had a nuclear test with Dr. Loralie Champagne - improved on Coreg.  His medical history has changed drastically over the last 12 month, he suffered a congestive heart failure episode and has been controlled by Coreg.  His bladder cancer was discovered in Dec 2014. Underwent BCG treatment. Has a 30% re-occurrence risk.    Download 08-27-14  The data collection began on 07-28-14 and ended on 08-26-14 CPAP is used at 8 cm water pressure with 2 cm EPR the residual AHI is 1.6 which is excellent,  the patient uses a machine on average 8 hours and 24 minutes at night and has 100% compliance for nightly use  over 4 hours. Median daily usage 8 hours 11 minutes.   Review of Systems: Out of a complete 14 system review, the patient complains of only the following symptoms, and all other reviewed systems are negative.  Sleeping better, SOB today, coughing. has ciliary injection, hay fever.  Severe  allergic rhinitis. Obese.   History   Social History  . Marital Status: Married    Spouse Name: Pamala Hurry    Number of Children: 2  . Years of Education: Post Grad   Occupational History  . [employer]   . PRESIDENT    Social History Main Topics  . Smoking status: Never Smoker   . Smokeless tobacco: Never  Used  . Alcohol Use: 0.0 oz/week     Comment: occasional  . Drug Use: No  . Sexual Activity: Not on file   Other Topics Concern  . Not on file   Social History Narrative   Patient lives at home with spouse.    Caffeine Use: 3-4 cups    Family History  Problem Relation Age of Onset  . Heart attack Father 18  . Stroke Father 16  . Heart attack Paternal Grandfather     in 95s  . Prostate cancer Paternal Uncle   . Heart attack Paternal Uncle      > 55  . Colon cancer Neg Hx   . Diabetes Neg Hx   . Heart attack Maternal Uncle 80    > 55    Past Medical History  Diagnosis Date  . Hyperlipidemia   . Hyperglycemia 2001    w/ normal A1c   . Liver enzyme elevation 2004  . Gilbert syndrome   . Colonic polyp     adenomatous, PMH of Dr Henrene Pastor  . Diverticulosis   . Urethral stricture     surgery ; Dr Reece Agar  . Testosterone deficiency   . Anxiety     PMH of  . Obesity   . Carpal tunnel syndrome  LUE  . DDD (degenerative disc disease), cervical   . GERD (gastroesophageal reflux disease)   . Sleep apnea, obstructive 02/20/2013    CPAP since 12-2012 . 9 cm water , [facility] .  Marland Kitchen Hypertension   . Diastolic heart failure     controlled with carvedilol per pt  . Bladder cancer     Past Surgical History  Procedure Laterality Date  . Polypectomy  2007 & 2012    X2 ; Dr Henrene Pastor  . Cystoscopy      X2  . Urethral dilation      secondary to trauma; Dr Reece Agar  . Tonsillectomy    . Vasectomy    . Cholecystectomy  11/07  . Varicose vein surgery  2002  . Facial fracture surgery  1987    blow out fracture & occipital fracture from basketball injuly   . Cataract extraction, bilateral  2&01/2012  . Orbital fracture surgery      left eye: 1986  . Transurethral resection of bladder tumor with gyrus (turbt-gyrus) N/A 11/06/2013    Procedure: TRANSURETHRAL RESECTION OF BLADDER TUMOR WITH GYRUS (TURBT-GYRUS);  Surgeon: Ailene Rud, MD;  Location: [facility];  Service:  Urology;  Laterality: N/A;    Current Outpatient Prescriptions  Medication Sig Dispense Refill  . AMBULATORY NON FORMULARY MEDICATION CPAP Machine      . aspirin 81 MG tablet Take 81 mg by mouth every evening.       . carvedilol (COREG) 6.25 MG tablet Take 1 tablet (6.25 mg total) by mouth 2 (two) times daily.  60 tablet  6  . chlorpheniramine-HYDROcodone (TUSSIONEX PENNKINETIC ER) 10-8 MG/5ML LQCR Take 5 mLs by mouth every 12 (twelve) hours as needed for cough.  115 mL  1  . escitalopram (LEXAPRO) 20 MG tablet Take 20 mg by mouth daily.      Marland Kitchen latanoprost (XALATAN) 0.005 % ophthalmic solution Place 1 drop into both eyes every morning.       Marland Kitchen lisinopril (PRINIVIL,ZESTRIL) 20 MG tablet Take 20 mg by mouth every morning.       . Multiple Vitamins-Minerals (CENTRUM SILVER PO) Take 1 tablet by mouth every morning.       . Omega-3 Fatty Acids (FISH OIL) 1000 MG CAPS Take 1,000 mg by mouth 2 (two) times daily.      Marland Kitchen oxyCODONE-acetaminophen (ROXICET) 5-325 MG per tablet Take 1 tablet by mouth every 6 (six) hours as needed for severe pain.  30 tablet  0  . pantoprazole (PROTONIX) 40 MG tablet TAKE ONE TABLET BY MOUTH ONCE DAILY  30 tablet  3  . phenazopyridine (PYRIDIUM) 200 MG tablet Take 1 tablet (200 mg total) by mouth 3 (three) times daily as needed for pain.  30 tablet  3  . rosuvastatin (CRESTOR) 5 MG tablet Take 1 tablet (5 mg total) by mouth daily.  30 tablet  3  . tamsulosin (FLOMAX) 0.4 MG CAPS capsule Take 1 capsule (0.4 mg total) by mouth daily.  30 capsule  0  . testosterone cypionate (DEPOTESTOTERONE CYPIONATE) 100 MG/ML injection Inject 300 mg into the muscle every 14 (fourteen) days. For IM use only      . trimethoprim (TRIMPEX) 100 MG tablet Take 1 tablet (100 mg total) by mouth 1 day or 1 dose.  30 tablet  1   No current facility-administered medications for this visit.    Allergies as of 08/27/2014 - Review Complete 08/27/2014  Allergen Reaction Noted  . Pollen extract Cough  01/13/2013  Vitals: BP 114/65  Pulse 78  Temp(Src) 99 F (37.2 C) (Oral)  Resp 20  Ht 6' 4.5" (1.943 m)  Wt 279 lb (126.554 kg)  BMI 33.52 kg/m2 Last Weight:  Wt Readings from Last 1 Encounters:  08/27/14 279 lb (126.554 kg)   Last Height:   Ht Readings from Last 1 Encounters:  08/27/14 6' 4.5" (1.943 m)     General: The patient is awake, alert and appears  in acute distress from hay fever and sinusitis.  The patient is well groomed. Head: Normocephalic, atraumatic. Neck is supple. Mallampati 3, neck circumference: unchanged.  Retrognathia.  Cardiovascular: Regular rate and rhythm, without murmurs or carotid bruit, and without distended neck veins. Respiratory: Lungs are clear to auscultation. Skin:  Without evidence of edema, or rash Trunk: BMI remains elevated,  normal posture.   Neurologic exam : The patient is awake and alert, oriented to place and time.   Memory subjective  described as intact. There is a normal attention span & concentration ability. Speech is fluent without   dysarthria, dysphonia or aphasia.  Mood and affect are appropriate.  Cranial nerves: Pupils are equal and briskly reactive to light.  Nystagmus with gaze to the left , unrestricted . No visual field loss.  Visual fields by finger perimetry are intact.  Facial motor strength is symmetric and tongue and uvula move midline.  Motor exam: Normal tone and normal muscle bulk and symmetric normal strength in all extremities.  Sensory:  Fine touch, pinprick and vibration were tested in all extremities. Proprioception is  normal.  Coordination: Rapid alternating movements in the fingers/hands is tested and normal.  Gait and station: Patient walks without assistive device .  Deep tendon reflexes: in the  upper and lower extremities are symmetric and intact. Babinski maneuver  downgoing.   Assessment:  After physical and neurologic examination, review of laboratory studies, imaging,  neurophysiology testing and pre-existing records, assessment will be reviewed on the problem list. Sinusitis with clear serose otitis, rhinitis and vestibular involvement. OSA - on CPAP well controlled.  Obesity , not reduced over the last 10 month.    Plan:  Treatment plan and additional workup :  New hose,  new filter for CPAP, new nasal pillow needed after this period of allergic/ inflammatory sinusitis.  Follow up with Dr. Dagmar Hait.  CPAP 8 cm , [facility] is DME . Information and education were provided in previous visit.  Rv in 12 month.

## 2014-08-28 ENCOUNTER — Encounter: Payer: Self-pay | Admitting: Neurology

## 2014-09-06 ENCOUNTER — Other Ambulatory Visit: Payer: Self-pay | Admitting: Internal Medicine

## 2014-09-06 DIAGNOSIS — R059 Cough, unspecified: Secondary | ICD-10-CM | POA: Insufficient documentation

## 2014-09-06 DIAGNOSIS — R634 Abnormal weight loss: Secondary | ICD-10-CM

## 2014-09-06 DIAGNOSIS — R05 Cough: Secondary | ICD-10-CM | POA: Insufficient documentation

## 2014-09-10 ENCOUNTER — Ambulatory Visit
Admission: RE | Admit: 2014-09-10 | Discharge: 2014-09-10 | Disposition: A | Discharge status: Home / Self Care | Payer: 59 | Source: Ambulatory Visit | Attending: Internal Medicine | Admitting: Internal Medicine

## 2014-09-10 DIAGNOSIS — R634 Abnormal weight loss: Secondary | ICD-10-CM

## 2014-09-11 ENCOUNTER — Other Ambulatory Visit (INDEPENDENT_AMBULATORY_CARE_PROVIDER_SITE_OTHER): Payer: 59 | Admitting: *Deleted

## 2014-09-11 DIAGNOSIS — I2510 Atherosclerotic heart disease of native coronary artery without angina pectoris: Secondary | ICD-10-CM

## 2014-09-11 DIAGNOSIS — E785 Hyperlipidemia, unspecified: Secondary | ICD-10-CM

## 2014-09-11 LAB — HEPATIC FUNCTION PANEL
ALBUMIN: 3.5 g/dL (ref 3.5–5.2)
ALT: 54 U/L — AB (ref 0–53)
AST: 32 U/L (ref 0–37)
Alkaline Phosphatase: 44 U/L (ref 39–117)
Bilirubin, Direct: 0 mg/dL (ref 0.0–0.3)
Total Bilirubin: 1.2 mg/dL (ref 0.2–1.2)
Total Protein: 7.2 g/dL (ref 6.0–8.3)

## 2014-09-11 LAB — LIPID PANEL
Cholesterol: 130 mg/dL (ref 0–200)
HDL: 34.4 mg/dL — ABNORMAL LOW (ref >39.00)
NonHDL: 95.6
Total CHOL/HDL Ratio: 4
Triglycerides: 285 mg/dL — ABNORMAL HIGH (ref 0.0–149.0)
VLDL: 57 mg/dL — ABNORMAL HIGH (ref 0.0–40.0)

## 2014-09-12 ENCOUNTER — Other Ambulatory Visit: Payer: Self-pay

## 2014-09-12 LAB — LDL CHOLESTEROL, DIRECT: Direct LDL: 67.7 mg/dL

## 2014-09-13 ENCOUNTER — Other Ambulatory Visit: Payer: Self-pay | Admitting: *Deleted

## 2014-09-13 DIAGNOSIS — E782 Mixed hyperlipidemia: Secondary | ICD-10-CM

## 2014-09-13 MED ORDER — ROSUVASTATIN CALCIUM 5 MG PO TABS: 5.0000 mg | ORAL_TABLET | Freq: Every day | ORAL | Status: DC

## 2014-09-17 ENCOUNTER — Telehealth: Payer: Self-pay | Admitting: Cardiology

## 2014-09-17 NOTE — Telephone Encounter (Signed)
Notes recorded by Dr. Aundra Dubin: Very good LDL, watch diet with increased triglycerides compared to prior. ALT is slightly increased, would repeat LFTs in 2 months to make sure it is back to normal range.  Informed patient of lab results.  Follow up lab appointment made. Pt states the fish oil he has been on for years also treats triglycerides.  Recently he switched to one that does not treat triglycerides.  He will be changing back soon.

## 2014-09-17 NOTE — Telephone Encounter (Signed)
Left message to call back  

## 2014-09-17 NOTE — Telephone Encounter (Signed)
Follow up  ° ° ° °Returning call back to nurse  °

## 2014-09-17 NOTE — Telephone Encounter (Signed)
New message ° ° ° ° °Want lab results °

## 2014-09-30 DIAGNOSIS — J9811 Atelectasis: Secondary | ICD-10-CM | POA: Insufficient documentation

## 2014-10-15 ENCOUNTER — Encounter: Payer: Self-pay | Admitting: Pulmonary Disease

## 2014-10-15 ENCOUNTER — Ambulatory Visit (INDEPENDENT_AMBULATORY_CARE_PROVIDER_SITE_OTHER): Payer: 59 | Admitting: Pulmonary Disease

## 2014-10-15 VITALS — BP 130/78 | HR 79 | Ht 76.5 in | Wt 268.0 lb

## 2014-10-15 DIAGNOSIS — J309 Allergic rhinitis, unspecified: Secondary | ICD-10-CM | POA: Insufficient documentation

## 2014-10-15 DIAGNOSIS — J302 Other seasonal allergic rhinitis: Secondary | ICD-10-CM

## 2014-10-15 DIAGNOSIS — J189 Pneumonia, unspecified organism: Secondary | ICD-10-CM

## 2014-10-15 NOTE — Assessment & Plan Note (Signed)
This symptom is poorly controlled and is contributing to his ongoing cough.  Plan: -I advised him on appropriate use of nasal steroids -Use cetirizine, if that does not help then I have recommended chlorpheniramine/phenylephrine combination tablets -Voice rest is encouraged

## 2014-10-15 NOTE — Patient Instructions (Signed)
You can resume your cancer treatments and exercise  You need to try to suppress your cough to allow your larynx (voice box) to heal.  For three days don't talk, laugh, sing, or clear your throat. Do everything you can to suppress the cough during this time. Use hard candies (sugarless Jolly Ranchers) or non-mint or non-menthol containing cough drops during this time to soothe your throat.  Use a cough suppressant (Delsym or what I have prescribed you) around the clock during this time.  After three days, gradually increase the use of your voice and back off on the cough suppressants.  Use Nasacort two puffs in each nostril once per day.  Remember that the Nasacort can take 1-2 weeks to work after regular use. Use generic zyrtec (cetirizine) every day.  If this doesn't help, then stop taking it and use chlorpheniramine-phenylephrine combination tablets.   Follow up with [place] if you feel worse

## 2014-10-15 NOTE — Progress Notes (Signed)
Subjective:    Patient ID: Brian Cortez, male    DOB: 1951/08/12, 63 y.o.   MRN: [id number]  HPI   Chief Complaint  Patient presents with  . Advice Only    Referred by Dr. Dagmar Hait for LLL collapse X6 weeks.    Brian Cortez sees Dr. Dagmar Hait at [facility] for an abnormal chest x-ray.  He had pneumonia this year and had a collapsed segment of his left upper lobe afterwards.  He had pneumonia diagnosed in Brian October and he was treated with Levaquin.  He continued to have some symptoms so he says that he was treated with prednisone.  He returned to the doctor several days later when he had some sinus symptoms and he was given more prendisone.  His symptom have since resolved but he continues to have a slight dry cough.  No mucus production but he feels some chest congestion.  He still has some "minor" sinus congestion.  He occassonally.  Cough is slowly improving.  His symptoms are better.  He recalls having dyspnea and chest "discomfort" at the beginning of the illness and he is slowly getting better.  His energy level has slowly improved.  No fever, no chills, no night sweats.   Never smoked, never told that he had a lung problem in years past.  He has suffered with allergic rhinitis over the years and this is often complicated by sinus infections treated with levaquin and prednisone.    He had "walking pneumonia as an adult" and he had pneumonia at age 71 and 44 treated with antibiotics.   He has bladder cancer being treated with BCG therapy but that has been on hold recently.  Past Medical History  Diagnosis Date  . Hyperlipidemia   . Hyperglycemia 2001    w/ normal A1c   . Liver enzyme elevation 2004  . Gilbert syndrome   . Colonic polyp     adenomatous, PMH of Dr Henrene Pastor  . Diverticulosis   . Urethral stricture     surgery ; Dr Reece Agar  . Testosterone deficiency   . Anxiety     PMH of  . Obesity   . Carpal tunnel syndrome     LUE  . DDD (degenerative disc disease),  cervical   . GERD (gastroesophageal reflux disease)   . Sleep apnea, obstructive 02/20/2013    CPAP since 12-2012 . 9 cm water , [facility] .  Marland Kitchen Hypertension   . Diastolic heart failure     controlled with carvedilol per pt  . Bladder cancer      Family History  Problem Relation Age of Onset  . Heart attack Father 22  . Stroke Father 50  . Heart attack Paternal Grandfather     in 74s  . Prostate cancer Paternal Uncle   . Heart attack Paternal Uncle      > 55  . Colon cancer Neg Hx   . Diabetes Neg Hx   . Heart attack Maternal Uncle 80    > 55     History   Social History  . Marital Status: Married    Spouse Name: Pamala Hurry    Number of Children: 2  . 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 Smoking status: Never Smoker   . Smokeless tobacco: Never Used  . Alcohol Use: 0.0 oz/week    0 Not specified per week  Comment: occasional  . Drug Use: No  . Sexual Activity: Not on file   Other Topics Concern  . Not on file   Social History Narrative   Patient lives at home with spouse.    Caffeine Use: 3-4 cups     Allergies  Allergen Reactions  . Pollen Extract Cough    "sinus issues"     Outpatient Prescriptions Prior to Visit  Medication Sig Dispense Refill  . AMBULATORY NON FORMULARY MEDICATION CPAP Machine    . aspirin 81 MG tablet Take 81 mg by mouth every evening.     . carvedilol (COREG) 6.25 MG tablet Take 1 tablet (6.25 mg total) by mouth 2 (two) times daily. 60 tablet 6  . escitalopram (LEXAPRO) 20 MG tablet Take 20 mg by mouth daily.    Marland Kitchen latanoprost (XALATAN) 0.005 % ophthalmic solution Place 1 drop into both eyes every morning.     . Multiple Vitamins-Minerals (CENTRUM SILVER PO) Take 1 tablet by mouth every morning.     . Omega-3 Fatty Acids (FISH OIL) 1000 MG CAPS Take 1,000 mg by mouth 2 (two) times daily.    . pantoprazole (PROTONIX) 40 MG tablet TAKE ONE TABLET BY  MOUTH ONCE DAILY 30 tablet 3  . phenazopyridine (PYRIDIUM) 200 MG tablet Take 1 tablet (200 mg total) by mouth 3 (three) times daily as needed for pain. 30 tablet 3  . rosuvastatin (CRESTOR) 5 MG tablet Take 1 tablet (5 mg total) by mouth daily. 30 tablet 11  . tamsulosin (FLOMAX) 0.4 MG CAPS capsule Take 1 capsule (0.4 mg total) by mouth daily. 30 capsule 0  . testosterone cypionate (DEPOTESTOTERONE CYPIONATE) 100 MG/ML injection Inject 300 mg into the muscle every 14 (fourteen) days. For IM use only    . chlorpheniramine-HYDROcodone (TUSSIONEX PENNKINETIC ER) 10-8 MG/5ML LQCR Take 5 mLs by mouth every 12 (twelve) hours as needed for cough. (Patient not taking: Reported on 10/15/2014) 115 mL 1  . lisinopril (PRINIVIL,ZESTRIL) 20 MG tablet Take 20 mg by mouth every morning.     Marland Kitchen oxyCODONE-acetaminophen (ROXICET) 5-325 MG per tablet Take 1 tablet by mouth every 6 (six) hours as needed for severe pain. (Patient not taking: Reported on 10/15/2014) 30 tablet 0  . trimethoprim (TRIMPEX) 100 MG tablet Take 1 tablet (100 mg total) by mouth 1 day or 1 dose. (Patient not taking: Reported on 10/15/2014) 30 tablet 1   No facility-administered medications prior to visit.      Review of Systems  Constitutional: Negative for fever and unexpected weight change.  HENT: Positive for congestion. Negative for dental problem, ear pain, nosebleeds, postnasal drip, rhinorrhea, sinus pressure, sneezing, sore throat and trouble swallowing.   Eyes: Negative for redness and itching.  Respiratory: Positive for cough and shortness of breath. Negative for chest tightness and wheezing.   Cardiovascular: Negative for palpitations and leg swelling.  Gastrointestinal: Negative for nausea and vomiting.  Genitourinary: Negative for dysuria.  Musculoskeletal: Negative for joint swelling.  Skin: Negative for rash.  Neurological: Negative for headaches.  Hematological: Does not bruise/bleed easily.  Psychiatric/Behavioral:  Negative for dysphoric mood. The patient is not nervous/anxious.        Objective:   Physical Exam Filed Vitals:   10/15/14 1339  BP: 130/78  Pulse: 79  Height: 6' 4.5" (1.943 m)  Weight: 268 lb (121.564 kg)  SpO2: 98%  RA  Gen: overweight but well appearing, no acute distress HEENT: NCAT, PERRL, EOMi, OP clear, neck supple without masses PULM: CTA  B CV: RRR, no mgr, no JVD AB: BS+, soft, nontender, no hsm Ext: warm, no edema, no clubbing, no cyanosis Derm: no rash or skin breakdown Neuro: A&Ox4, CN II-XII intact, strength 5/5 in all 4 extremities  09/10/2014 CT chest> images reviewed with patient today in clinic, there is superior segment of the left lower lobe atelectasis and mild consolidation noted, no other abnormality evident  Today in clinic Brian Cortez and I reviewed multiple chest x-rays dating from 08/27/2014 all the way to 09/30/2014, this shows initially left hilar consolidation which gradually improved and then eventually completely resolved by 09/30/2014.      Assessment & Plan:   CAP (community acquired pneumonia) Brian Cortez as symptoms are completely consistent with the natural history of community-acquired pneumonia. Fortunately, his chest x-ray has now normalized and he only has a very mild residual dry cough. His lungs are clear to auscultation on exam today and his vital signs are normal subclinically he has had a good recovery.  I have been asked to comment on whether or not this could be related to his intravesical BCG therapy. While infectious complications have certainly been reported, this clinical course has been consistent with community-acquired pneumonia and not pneumonitis which has been described with BCG therapy. Fortunately, we have the benefit of the CT scan to review which showed some mild consolidation, again consistent with a typical course of community-acquired pneumonia. In BCG therapy pulmonary complications (which are very rare) the  typical imaging characteristic is a miliary distribution of small nodules. We did not see that in the CT scan Brian Cortez held in October 2015.  For the cough, I have advised treatment for his allergic rhinitis and voice rest.  Plan: -Okay to resume cancer treatment -I encouraged voice rest for his cough -No further imaging needed from my standpoint -Resume exercise -Follow-up as needed  Allergic rhinitis This symptom is poorly controlled and is contributing to his ongoing cough.  Plan: -I advised him on appropriate use of nasal steroids -Use cetirizine, if that does not help then I have recommended chlorpheniramine/phenylephrine combination tablets -Voice rest is encouraged   Updated Medication List Outpatient Encounter Prescriptions as of 10/15/2014  Medication Sig  . AMBULATORY NON FORMULARY MEDICATION CPAP Machine  . aspirin 81 MG tablet Take 81 mg by mouth every evening.   . carvedilol (COREG) 6.25 MG tablet Take 1 tablet (6.25 mg total) by mouth 2 (two) times daily.  Marland Kitchen escitalopram (LEXAPRO) 20 MG tablet Take 20 mg by mouth daily.  Marland Kitchen latanoprost (XALATAN) 0.005 % ophthalmic solution Place 1 drop into both eyes every morning.   . Multiple Vitamins-Minerals (CENTRUM SILVER PO) Take 1 tablet by mouth every morning.   . Omega-3 Fatty Acids (FISH OIL) 1000 MG CAPS Take 1,000 mg by mouth 2 (two) times daily.  Marland Kitchen OVER THE COUNTER MEDICATION Redd2- 1 tablet bid- for macular degeneration  . pantoprazole (PROTONIX) 40 MG tablet TAKE ONE TABLET BY MOUTH ONCE DAILY  . phenazopyridine (PYRIDIUM) 200 MG tablet Take 1 tablet (200 mg total) by mouth 3 (three) times daily as needed for pain.  . rosuvastatin (CRESTOR) 5 MG tablet Take 1 tablet (5 mg total) by mouth daily.  . tamsulosin (FLOMAX) 0.4 MG CAPS capsule Take 1 capsule (0.4 mg total) by mouth daily.  Marland Kitchen testosterone cypionate (DEPOTESTOTERONE CYPIONATE) 100 MG/ML injection Inject 300 mg into the muscle every 14 (fourteen) days. For IM  use only  . chlorpheniramine-HYDROcodone (TUSSIONEX PENNKINETIC ER) 10-8 MG/5ML LQCR Take 5 mLs  by mouth every 12 (twelve) hours as needed for cough. (Patient not taking: Reported on 10/15/2014)  . lisinopril (PRINIVIL,ZESTRIL) 20 MG tablet Take 20 mg by mouth every morning.   . [DISCONTINUED] oxyCODONE-acetaminophen (ROXICET) 5-325 MG per tablet Take 1 tablet by mouth every 6 (six) hours as needed for severe pain. (Patient not taking: Reported on 10/15/2014)  . [DISCONTINUED] trimethoprim (TRIMPEX) 100 MG tablet Take 1 tablet (100 mg total) by mouth 1 day or 1 dose. (Patient not taking: Reported on 10/15/2014)

## 2014-10-15 NOTE — Assessment & Plan Note (Signed)
Brian Cortez as symptoms are completely consistent with the natural history of community-acquired pneumonia. Fortunately, his chest x-ray has now normalized and he only has a very mild residual dry cough. His lungs are clear to auscultation on exam today and his vital signs are normal subclinically he has had a good recovery.  I have been asked to comment on whether or not this could be related to his intravesical BCG therapy. While infectious complications have certainly been reported, this clinical course has been consistent with community-acquired pneumonia and not pneumonitis which has been described with BCG therapy. Fortunately, we have the benefit of the CT scan to review which showed some mild consolidation, again consistent with a typical course of community-acquired pneumonia. In BCG therapy pulmonary complications (which are very rare) the typical imaging characteristic is a miliary distribution of small nodules. We did not see that in the CT scan Brian Cortez held in October 2015.  For the cough, I have advised treatment for his allergic rhinitis and voice rest.  Plan: -Okay to resume cancer treatment -I encouraged voice rest for his cough -No further imaging needed from my standpoint -Resume exercise -Follow-up as needed

## 2014-11-06 ENCOUNTER — Other Ambulatory Visit: Payer: Self-pay | Admitting: Internal Medicine

## 2014-11-11 ENCOUNTER — Other Ambulatory Visit: Payer: Self-pay | Admitting: *Deleted

## 2014-11-11 DIAGNOSIS — E785 Hyperlipidemia, unspecified: Secondary | ICD-10-CM

## 2014-11-12 ENCOUNTER — Other Ambulatory Visit (INDEPENDENT_AMBULATORY_CARE_PROVIDER_SITE_OTHER): Payer: 59 | Admitting: *Deleted

## 2014-11-12 ENCOUNTER — Other Ambulatory Visit: Payer: Self-pay | Admitting: Cardiology

## 2014-11-12 DIAGNOSIS — E785 Hyperlipidemia, unspecified: Secondary | ICD-10-CM

## 2014-11-12 LAB — HEPATIC FUNCTION PANEL
ALBUMIN: 3.9 g/dL (ref 3.5–5.2)
ALT: 23 U/L (ref 0–53)
AST: 29 U/L (ref 0–37)
Alkaline Phosphatase: 43 U/L (ref 39–117)
Bilirubin, Direct: 0.1 mg/dL (ref 0.0–0.3)
Total Bilirubin: 0.8 mg/dL (ref 0.2–1.2)
Total Protein: 6.7 g/dL (ref 6.0–8.3)

## 2014-11-25 ENCOUNTER — Encounter: Payer: Self-pay | Admitting: Cardiology

## 2014-11-25 DIAGNOSIS — M722 Plantar fascial fibromatosis: Secondary | ICD-10-CM | POA: Insufficient documentation

## 2014-11-29 ENCOUNTER — Ambulatory Visit: Payer: 59 | Admitting: Internal Medicine

## 2014-12-10 ENCOUNTER — Encounter: Payer: Self-pay | Admitting: Cardiology

## 2014-12-10 ENCOUNTER — Encounter: Payer: Self-pay | Admitting: *Deleted

## 2014-12-10 ENCOUNTER — Ambulatory Visit (INDEPENDENT_AMBULATORY_CARE_PROVIDER_SITE_OTHER): Payer: 59 | Admitting: Cardiology

## 2014-12-10 VITALS — BP 124/74 | HR 64 | Ht 76.5 in | Wt 267.0 lb

## 2014-12-10 DIAGNOSIS — E782 Mixed hyperlipidemia: Secondary | ICD-10-CM

## 2014-12-10 DIAGNOSIS — I429 Cardiomyopathy, unspecified: Secondary | ICD-10-CM

## 2014-12-10 MED ORDER — ROSUVASTATIN CALCIUM 5 MG PO TABS: 5.0000 mg | ORAL_TABLET | Freq: Every day | ORAL | Status: DC

## 2014-12-10 NOTE — Patient Instructions (Signed)
.  Your physician has requested that you have an echocardiogram. Echocardiography is a painless test that uses sound waves to create images of your heart. It provides your doctor with information about the size and shape of your heart and how well your heart's chambers and valves are working. This procedure takes approximately one hour. There are no restrictions for this procedure.  Your physician wants you to follow-up in: 1 year with Dr. Aundra Dubin. (January 2017). You will receive a reminder letter in the mail two months in advance. If you don't receive a letter, please call our office to schedule the follow-up appointment.

## 2014-12-10 NOTE — Progress Notes (Signed)
Patient ID: Brian Cortez, male   DOB: Oct 08, 1951, 64 y.o.   MRN: [id number] PCP: Dr. Dagmar Hait  64 yo with history of hyperlipidemia and HTN returns for cardiology evaluation.  I initially saw him after several episodes of profound fatigue.  Echo was done at that time in 9/14 showing EF 45-50% with mild global hypokinesis.  Given the symptoms of profound tiredness, the coronary artery calcification on a chest CT, and the mildly decreased LV systolic function, I had him do an ETT-Cardiolite in 10/14.  This was actually a normal study with 8:29 exercise, no ECG changes, and no evidence for ischemia or infarction.  Interestingly, EF was 63% on the Cardiolite (contrasting with the echo).  Event monitor showed no significant events.  I started him on Coreg and he did not have any more episodes of fatigue.  I also started lisinopril.  Since last appointment, he was diagnosed with bladder cancer and had resection and BCG treatment. He had PNA in 10/15.  Generally feeling good now.  No chest pain.  No episodes of fatigue.  He is not short of breath with exertion and can walk up steps without problems.  He does exercise classes several times a week.  He is off lisinopril now due to lightheadedness with standing.   ECG: NSR, borderline LVH.   Labs (9/14): K 4.6, creatinine 1.1, LDL 85, HDL 41, LFTs normal, troponin negative x 3, BNP normal  PMH: 1. OSA on CPAP 2. Hyperlipidemia 3. Depression 4. GERD 5. HTN 6. Low testosterone 7. Colonic polyps 8. CAD: Coronary calcification on CT chest in 9/14.  ETT-Cardiolite (10/14) with 8:29 exercise, no ischemic ECG changes, no ischemia or infarction on Cardiolite images.   9. Cardiomyopathy: Echo (9/14) with EF 45-50%, mild LVH, diffuse hypokinesis, moderate diastolic dysfunction, normal RV size with mildly decreased systolic function.  10. PNA 10/15 11. Bladder cancer: Resection in 12/14, also had BCG.  12. Event monitor 10/14 with no significant events.   SH:  Married, president of [employer], nonsmoker, drinks 2-3 glasses of wine approximately 5 times a week.   FH: No history of MI.  Father had CVA (was a smoker).   ROS: All systems reviewed and negative except as per HPI.   Current Outpatient Prescriptions  Medication Sig Dispense Refill  . AMBULATORY NON FORMULARY MEDICATION CPAP Machine    . aspirin 81 MG tablet Take 81 mg by mouth every evening.     . carvedilol (COREG) 6.25 MG tablet TAKE ONE TABLET TWICE DAILY 60 tablet 0  . escitalopram (LEXAPRO) 20 MG tablet Take 20 mg by mouth daily.    Marland Kitchen latanoprost (XALATAN) 0.005 % ophthalmic solution Place 1 drop into both eyes every morning.     . Multiple Vitamins-Minerals (CENTRUM SILVER PO) Take 1 tablet by mouth every morning.     . Multiple Vitamins-Minerals (PRESERVISION AREDS 2 PO) Take by mouth.    . Omega-3 Fatty Acids (FISH OIL) 1000 MG CAPS Take 1,000 mg by mouth 2 (two) times daily.    Marland Kitchen OVER THE COUNTER MEDICATION Redd2- 1 tablet bid- for macular degeneration    . pantoprazole (PROTONIX) 40 MG tablet TAKE ONE TABLET EACH DAY 30 tablet 1  . phenazopyridine (PYRIDIUM) 200 MG tablet Take 1 tablet (200 mg total) by mouth 3 (three) times daily as needed for pain. 30 tablet 3  . rosuvastatin (CRESTOR) 5 MG tablet Take 1 tablet (5 mg total) by mouth daily. 90 tablet 3  . tamsulosin (FLOMAX) 0.4  MG CAPS capsule Take 1 capsule (0.4 mg total) by mouth daily. 30 capsule 0  . testosterone cypionate (DEPOTESTOTERONE CYPIONATE) 100 MG/ML injection Inject 300 mg into the muscle every 14 (fourteen) days. For IM use only     No current facility-administered medications for this visit.    BP 124/74 mmHg  Pulse 64  Ht 6' 4.5" (1.943 m)  Wt 267 lb (121.11 kg)  BMI 32.08 kg/m2 General: NAD Neck: Thick, no JVD, no thyromegaly or thyroid nodule.  Lungs: Clear to auscultation bilaterally with normal respiratory effort. CV: Nondisplaced PMI.  Heart regular S1/S2, no S3/S4, no murmur.  No peripheral  edema.  No carotid bruit.  Normal pedal pulses.  Abdomen: Soft, nontender, no hepatosplenomegaly, no distention.  Skin: Intact without lesions or rashes.  Neurologic: Alert and oriented x 3.  Psych: Normal affect. Extremities: No clubbing or cyanosis.   Assessment/Plan: 1. CAD: Noted on CT chest (coronary calcification).  No ischemic symptoms since last appointment.  ETT-Cardiolite was normal in 2014.  At this time, would have him continue ASA 81, Coreg, and Crestor without further testing. 2. Cardiomyopathy: Mildly depressed LV and RV systolic function on 0/37 echo.  The mild RV dysfunction could presumably be due to OSA with some pulmonary HTN, but I cannot explain the LV systolic dysfunction.  I did not find evidence for significant obstructive CAD on Cardiolite.  Interestingly, EF was normal on the Cardiolite.  - Continue Coreg 6.25 mg bid.  - I will get a repeat echo.  If EF remains decreased, he will need to restart lisinopril (would use lower dose, 5 mg daily).   3. Hyperlipidemia: Patient has CAD by calcium in coronaries on CT. PCP did lipids recently, I will call for a copy.   Loralie Champagne 12/10/2014

## 2014-12-12 ENCOUNTER — Other Ambulatory Visit: Payer: Self-pay | Admitting: Cardiology

## 2014-12-14 ENCOUNTER — Other Ambulatory Visit: Payer: Self-pay | Admitting: Cardiology

## 2014-12-14 ENCOUNTER — Encounter: Payer: Self-pay | Admitting: Cardiology

## 2014-12-16 ENCOUNTER — Ambulatory Visit (HOSPITAL_COMMUNITY): Payer: 59 | Attending: Cardiology

## 2014-12-16 DIAGNOSIS — E782 Mixed hyperlipidemia: Secondary | ICD-10-CM | POA: Insufficient documentation

## 2014-12-16 DIAGNOSIS — R079 Chest pain, unspecified: Secondary | ICD-10-CM

## 2014-12-16 DIAGNOSIS — I429 Cardiomyopathy, unspecified: Secondary | ICD-10-CM | POA: Insufficient documentation

## 2014-12-16 NOTE — Progress Notes (Signed)
2D Echo completed. 12/16/2014

## 2014-12-17 ENCOUNTER — Encounter: Payer: Self-pay | Admitting: Cardiology

## 2014-12-17 ENCOUNTER — Other Ambulatory Visit: Payer: Self-pay

## 2014-12-17 MED ORDER — CARVEDILOL 6.25 MG PO TABS: 6.2500 mg | ORAL_TABLET | Freq: Two times a day (BID) | ORAL | Status: DC

## 2014-12-18 ENCOUNTER — Telehealth: Payer: Self-pay | Admitting: Cardiology

## 2014-12-18 DIAGNOSIS — E875 Hyperkalemia: Secondary | ICD-10-CM

## 2014-12-18 NOTE — Telephone Encounter (Signed)
New message     Patient calling stating the nurse call him today.

## 2014-12-18 NOTE — Telephone Encounter (Signed)
LMTCB

## 2014-12-18 NOTE — Telephone Encounter (Signed)
Spoke with patient about recent lab and echo results.

## 2014-12-24 ENCOUNTER — Other Ambulatory Visit (INDEPENDENT_AMBULATORY_CARE_PROVIDER_SITE_OTHER): Payer: 59 | Admitting: *Deleted

## 2014-12-24 DIAGNOSIS — E875 Hyperkalemia: Secondary | ICD-10-CM

## 2014-12-24 LAB — BASIC METABOLIC PANEL
BUN: 12 mg/dL (ref 6–23)
CO2: 30 meq/L (ref 19–32)
CREATININE: 1.08 mg/dL (ref 0.40–1.50)
Calcium: 9.2 mg/dL (ref 8.4–10.5)
Chloride: 102 mEq/L (ref 96–112)
GFR: 73.19 mL/min (ref >60.00)
Glucose, Bld: 123 mg/dL — ABNORMAL HIGH (ref 70–99)
Potassium: 4.6 mEq/L (ref 3.5–5.1)
Sodium: 136 mEq/L (ref 135–145)

## 2014-12-24 NOTE — Addendum Note (Signed)
Addended by: Eulis Foster on: 12/24/2014 08:04 AM   Modules accepted: Orders

## 2014-12-26 ENCOUNTER — Telehealth: Payer: Self-pay | Admitting: Cardiology

## 2014-12-26 NOTE — Telephone Encounter (Signed)
Discussed recent lab results with patient.  

## 2014-12-26 NOTE — Telephone Encounter (Signed)
New problem ° ° °Pt returning your call. °

## 2015-01-01 ENCOUNTER — Ambulatory Visit (INDEPENDENT_AMBULATORY_CARE_PROVIDER_SITE_OTHER): Payer: 59 | Admitting: Internal Medicine

## 2015-01-01 ENCOUNTER — Encounter: Payer: Self-pay | Admitting: Internal Medicine

## 2015-01-01 VITALS — BP 120/80 | HR 72 | Ht 75.25 in | Wt 267.4 lb

## 2015-01-01 DIAGNOSIS — K2100 Gastro-esophageal reflux disease with esophagitis, without bleeding: Secondary | ICD-10-CM

## 2015-01-01 DIAGNOSIS — Z86010 Personal history of colonic polyps: Secondary | ICD-10-CM

## 2015-01-01 MED ORDER — PANTOPRAZOLE SODIUM 40 MG PO TBEC: 40.0000 mg | DELAYED_RELEASE_TABLET | Freq: Every day | ORAL | Status: DC

## 2015-01-01 NOTE — Progress Notes (Signed)
HISTORY OF PRESENT ILLNESS:  Brian Cortez is a 64 y.o. male with multiple medical problems as listed below. He is followed in this office for chronic GERD and a history of adenomatous colon polyps. The patient was last evaluated February 2014 regarding ongoing management of GERD and chronic intermittent diarrhea. He was continued on Nexium 40 mg daily and prescribe an empiric trial of metronidazole. His problems with diarrhea resolved. His PPI was subsequently changed to pantoprazole 40 mg daily. This has worked very well without issues. He denies breakthrough reflux except for rare occasions of significant dietary indiscretion. He does have a history of latter cancer since his last visit, which has been in remission with ongoing therapy. His GI review of systems is entirely negative. He has had prior colonoscopies in 2001, 2007, and most recently 2012. Routine follow-up planned around February 2017. He is now seeing Dr. Dagmar Hait for his primary care  REVIEW OF SYSTEMS:  All non-GI ROS negative except for sinus and allergy trouble, sore throat,  Past Medical History  Diagnosis Date  . Hyperlipidemia   . Hyperglycemia 2001    w/ normal A1c   . Liver enzyme elevation 2004  . Gilbert syndrome   . Colonic polyp     adenomatous, PMH of Dr Henrene Pastor  . Diverticulosis   . Urethral stricture     surgery ; Dr Reece Agar  . Testosterone deficiency   . Anxiety     PMH of  . Obesity   . Carpal tunnel syndrome     LUE  . DDD (degenerative disc disease), cervical   . GERD (gastroesophageal reflux disease)   . Sleep apnea, obstructive 02/20/2013    CPAP since 12-2012 . 9 cm water , [facility] .  Marland Kitchen Hypertension   . Diastolic heart failure     controlled with carvedilol per pt  . Bladder cancer   . Cholelithiasis   . Hemorrhoids   . Pneumonia     Past Surgical History  Procedure Laterality Date  . Polypectomy  2007 & 2012    X2 ; Dr Henrene Pastor  . Cystoscopy      X2  . Urethral dilation      secondary  to trauma; Dr Reece Agar  . Tonsillectomy    . Vasectomy    . Cholecystectomy  11/07  . Varicose vein surgery  2002  . Facial fracture surgery  1987    blow out fracture & occipital fracture from basketball injuly   . Cataract extraction, bilateral  2&01/2012  . Orbital fracture surgery      left eye: 1986  . Transurethral resection of bladder tumor with gyrus (turbt-gyrus) N/A 11/06/2013    Procedure: TRANSURETHRAL RESECTION OF BLADDER TUMOR WITH GYRUS (TURBT-GYRUS);  Surgeon: Ailene Rud, MD;  Location: [facility];  Service: Urology;  Laterality: N/A;    Social History Brian Cortez  reports that he has never smoked. He has never used smokeless tobacco. He reports that he drinks alcohol. He reports that he does not use illicit drugs.  family history includes Heart attack in his paternal grandfather and paternal uncle; Heart attack (age of onset: 66) in his father; Heart attack (age of onset: 72) in his maternal uncle; Prostate cancer in his paternal uncle; Stroke (age of onset: 4) in his father. There is no history of Colon cancer or Diabetes.  Allergies  Allergen Reactions  . Pollen Extract Cough    "sinus issues"       PHYSICAL EXAMINATION: Vital signs: BP  120/80 mmHg  Pulse 72  Ht 6' 3.25" (1.911 m)  Wt 267 lb 6 oz (121.281 kg)  BMI 33.21 kg/m2 General: Well-developed, well-nourished, no acute distress HEENT: Sclerae are anicteric, conjunctiva pink. Oral mucosa intact Lungs: Clear Heart: Regular Abdomen: soft, nontender, nondistended, no obvious ascites, no peritoneal signs, normal bowel sounds. No organomegaly. Extremities: No edema Psychiatric: alert and oriented x3. Cooperative   ASSESSMENT:  #1. GERD. Asymptomatic on PPI #2. History of adenomatous colon polyps. Surveillance up-to-date   PLAN:  #1. Reflux precautions #2. Continue pantoprazole 40 mg daily. Prescription refilled #3. Surveillance colonoscopy around February 2017 #4. Routine office  follow-up in 2 years. Sooner if needed

## 2015-01-01 NOTE — Patient Instructions (Signed)
We have sent the following medications to your pharmacy for you to pick up at your convenience: pantoprazole  Follow up with Dr.Perry as needed

## 2015-01-08 ENCOUNTER — Encounter: Payer: Self-pay | Admitting: Neurology

## 2015-05-19 ENCOUNTER — Other Ambulatory Visit: Payer: Self-pay

## 2015-07-23 ENCOUNTER — Encounter: Payer: 59 | Admitting: Sports Medicine

## 2015-07-31 ENCOUNTER — Encounter: Payer: Self-pay | Admitting: Internal Medicine

## 2015-08-05 ENCOUNTER — Encounter: Payer: 59 | Admitting: Sports Medicine

## 2015-08-05 ENCOUNTER — Encounter: Payer: Self-pay | Admitting: Neurology

## 2015-08-06 ENCOUNTER — Encounter: Payer: Self-pay | Admitting: Sports Medicine

## 2015-08-06 ENCOUNTER — Ambulatory Visit (INDEPENDENT_AMBULATORY_CARE_PROVIDER_SITE_OTHER): Payer: 59 | Admitting: Sports Medicine

## 2015-08-06 VITALS — BP 132/79 | HR 68 | Ht 76.0 in | Wt 270.0 lb

## 2015-08-06 DIAGNOSIS — M79671 Pain in right foot: Secondary | ICD-10-CM

## 2015-08-06 NOTE — Progress Notes (Signed)
   Subjective:    Patient ID: Brian Cortez, male    DOB: Feb 07, 1951, 64 y.o.   MRN: [id number]  HPI  chief complaint: Right heel pain  Very pleasant 64 year old male comes in today at the request of Dr. Noemi Chapel for custom orthotics. He has been recently diagnosed with plantar fasciitis in his right heel. He has been placed on a stair were Dosepak and referred to physical therapy. As a result his pain has improved dramatically. Prior to seeing Dr. Noemi Chapel he saw a podiatrist who made a rigid three quarters length orthotic and injected his heel. Despite both of these his pain persisted. Dr. Noemi Chapel then referred him over to our office for consideration of new custom orthotics. Patient localizes his pain to the right heel. Worse with first step in the morning. It is most noticeable at the end of playing golf. He is not noticed any swelling. No associated numbness or tingling.  Past medical history reviewed Medications reviewed Allergies reviewed    Review of Systems     Objective:   Physical Exam Well-developed, well-nourished. No acute distress  Right heel: He is tender to palpation at the calcaneal insertion of the plantar fascia. Negative calcaneal squeeze. Pes planus with standing and mild pronation with walking. Neurovascularly intact distally. Walking without a limp.       Assessment & Plan:  Right heel pain secondary to plantar fasciitis  I would be happy to construct custom orthotics for this patient. He brought his tennis shoes with him today and I explained to him that our traditional orthotic for that tennis shoe may not fit in his golf shoes or his stress shoes. We've decided to go ahead with a green sports insole and scaphoid pad in his tennis shoes and he will return to the office in a few days with both his golf shoes and his dress shoes. We will plan on constructing an insert primarily for his golf shoes since this seems to be when he gets most of his symptoms. I've  encouraged him to continue with the stretches that have been taught to him in physical therapy.

## 2015-08-11 ENCOUNTER — Encounter: Payer: Self-pay | Admitting: Sports Medicine

## 2015-08-11 ENCOUNTER — Ambulatory Visit (INDEPENDENT_AMBULATORY_CARE_PROVIDER_SITE_OTHER): Payer: 59 | Admitting: Sports Medicine

## 2015-08-11 VITALS — BP 144/82 | HR 63 | Ht 76.0 in | Wt 267.0 lb

## 2015-08-11 DIAGNOSIS — M722 Plantar fascial fibromatosis: Secondary | ICD-10-CM

## 2015-08-11 NOTE — Progress Notes (Signed)
   Subjective:    Patient ID: Brian Cortez, male    DOB: 27-Aug-1951, 64 y.o.   MRN: [id number]  HPI chief complaint: Right heel pain  Patient returns to the office today for custom orthotics. He was last seen in the office on 08/06/2015. He has been diagnosed with plantar fasciitis. At his last office visit we gave him some green sports insoles with scaphoid pads which have been comfortable in his tennis shoes. He would like to have some custom orthotics constructed for his golf shoes. He continues to localize pain to the right heel. No numbness or tingling. Most noticeable with playing golf.    Review of Systems As above    Objective:   Physical Exam  Well-developed, well-nourished. No acute distress. Awake alert and oriented 3. Vital signs reviewed  Tender to palpation at the calcaneal insertion of the plantar fascia in the right heel. Pes planus with standing. Pronation with walking. Neurovascularly intact distally. Walking without a limp.      Assessment & Plan:  Plantar fasciitis Pes planus  Custom orthotics were constructed for his golf shoes today. These were very comfortable prior to leaving the office. Total of 30 minutes was spent with the patient with greater than 50% of the time spent in face-to-face consultation discussing orthotic construction, instruction, and fitting. He will continue with his green sports insoles and scaphoid pads in his tennis shoes. He can try his new custom orthotics in his dress shoes as well but he is also encouraged to return to the office for custom orthotics for his dress shoes if need be. Follow-up as needed.  Patient was fitted for a : standard, cushioned, semi-rigid orthotic. The orthotic was heated and afterward the patient stood on the orthotic blank positioned on the orthotic stand. The patient was positioned in subtalar neutral position and 10 degrees of ankle dorsiflexion in a weight bearing stance. After completion of molding, a  stable base was applied to the orthotic blank. The blank was ground to a stable position for weight bearing. Size:12 Fastech Base: Blue EVA Posting: none Additional orthotic padding: none

## 2015-08-28 ENCOUNTER — Ambulatory Visit: Payer: 59 | Admitting: Neurology

## 2015-09-09 ENCOUNTER — Encounter: Payer: Self-pay | Admitting: Neurology

## 2015-09-09 ENCOUNTER — Ambulatory Visit (INDEPENDENT_AMBULATORY_CARE_PROVIDER_SITE_OTHER): Payer: 59 | Admitting: Neurology

## 2015-09-09 VITALS — BP 150/90 | HR 68 | Resp 20 | Ht 76.0 in | Wt 272.0 lb

## 2015-09-09 DIAGNOSIS — J0121 Acute recurrent ethmoidal sinusitis: Secondary | ICD-10-CM | POA: Insufficient documentation

## 2015-09-09 DIAGNOSIS — J40 Bronchitis, not specified as acute or chronic: Secondary | ICD-10-CM | POA: Diagnosis not present

## 2015-09-09 DIAGNOSIS — J301 Allergic rhinitis due to pollen: Secondary | ICD-10-CM | POA: Diagnosis not present

## 2015-09-09 DIAGNOSIS — G4733 Obstructive sleep apnea (adult) (pediatric): Secondary | ICD-10-CM

## 2015-09-09 DIAGNOSIS — Z9989 Dependence on other enabling machines and devices: Principal | ICD-10-CM

## 2015-09-09 NOTE — Progress Notes (Signed)
[facility]  Provider:  Larey Seat, M D  Referring Provider: Prince Solian, MD Primary Care Physician:  Tivis Ringer, MD  Chief Complaint  Patient presents with  . Follow-up    cpap, rm 11, alone    HPI:  Brian Cortez is a 64 y.o. male seen here as a revisit  from Dr. Dagmar Hait,  Fatigue and lightheadedness were improved, until last month. He has no longer nocturia since being on CPAP. In 2016 he  went to the ED and was diagnosed with diastolic heart failure, had a nuclear test with Dr. Loralie Champagne - improved on Coreg.  His medical history has changed drastically over the last 12 month, he suffered a congestive heart failure episode and has been controlled by Coreg.  His bladder cancer was discovered in Dec 2014. Underwent BCG treatment. Has a 30% re-occurrence risk. He had pneumonia and changed PCPs.   Download 08-27-14  The data collection began on 07-28-14 and ended on 08-26-14 CPAP is used at 8 cm water pressure with 2 cm EPR the residual AHI is 1.6 which is excellent,  the patient uses a machine on average 8 hours and 24 minutes at night and has 100% compliance for nightly use  over 4 hours. Median daily usage 8 hours 11 minutes.  Interval history history from 09-09-15, the patient's 100% compliant for the number of 30 days and each of these days over 4 hours of consecutive use has been registered. On average he uses his machine for 8 hours and 47 minutes set pressure is 8 cm water with 2 cm expiratory pressure relief ( EPR) the residual AHI is 1.4 he has minimal air leaks, his Epworth sleepiness score today is 1. fatigue severity 6 points and the geriatric depression score is endorsed at one point.   Review of Systems: Out of a complete 14 system review, the patient complains of only the following symptoms, and all other reviewed systems are negative.  Sleeping better, SOB today, coughing. has ciliary injection, hay fever.  Severe  allergic rhinitis.  Obese.   Social History   Social History  . Marital Status: Married    Spouse Name: Pamala Hurry  . Number of Children: 2  . Years of Education: Post Grad   Occupational History  . [employer]   . PRESIDENT    Social History Main Topics  . Smoking status: Never Smoker   . Smokeless tobacco: Never Used  . Alcohol Use: 0.0 oz/week    0 Standard drinks or equivalent per week     Comment: occasional  . Drug Use: No  . Sexual Activity: Not on file   Other Topics Concern  . Not on file   Social History Narrative   Patient lives at home with spouse.    Caffeine Use: 3-4 cups    Family History  Problem Relation Age of Onset  . Heart attack Father 8  . Stroke Father 79  . Heart attack Paternal Grandfather     in 73s  . Prostate cancer Paternal Uncle   . Heart attack Paternal Uncle      > 55  . Colon cancer Neg Hx   . Diabetes Neg Hx   . Heart attack Maternal Uncle 80    > 55    Past Medical History  Diagnosis Date  . Hyperlipidemia   . Hyperglycemia 2001    w/ normal A1c   . Liver enzyme elevation 2004  . Gilbert syndrome   .  Colonic polyp     adenomatous, PMH of Dr Henrene Pastor  . Diverticulosis   . Urethral stricture     surgery ; Dr Reece Agar  . Testosterone deficiency   . Anxiety     PMH of  . Obesity   . Carpal tunnel syndrome     LUE  . DDD (degenerative disc disease), cervical   . GERD (gastroesophageal reflux disease)   . Sleep apnea, obstructive 02/20/2013    CPAP since 12-2012 . 9 cm water , respicare .  Marland Kitchen Hypertension   . Diastolic heart failure (HCC)     controlled with carvedilol per pt  . Bladder cancer ([place])   . Cholelithiasis   . Hemorrhoids   . Pneumonia     Past Surgical History  Procedure Laterality Date  . Polypectomy  2007 & 2012    X2 ; Dr Henrene Pastor  . Cystoscopy      X2  . Urethral dilation      secondary to trauma; Dr Reece Agar  . Tonsillectomy    . Vasectomy    . Cholecystectomy  11/07  . Varicose vein surgery  2002  . Facial  fracture surgery  1987    blow out fracture & occipital fracture from basketball injuly   . Cataract extraction, bilateral  2&01/2012  . Orbital fracture surgery      left eye: 1986  . Transurethral resection of bladder tumor with gyrus (turbt-gyrus) N/A 11/06/2013    Procedure: TRANSURETHRAL RESECTION OF BLADDER TUMOR WITH GYRUS (TURBT-GYRUS);  Surgeon: Ailene Rud, MD;  Location: [facility];  Service: Urology;  Laterality: N/A;    Current Outpatient Prescriptions  Medication Sig Dispense Refill  . albuterol (PROAIR HFA) 108 (90 BASE) MCG/ACT inhaler PROAIR HFA 108 (90 Base) MCG/ACT AERS    . AMBULATORY NON FORMULARY MEDICATION CPAP Machine    . aspirin 81 MG tablet Take 81 mg by mouth every evening.     Marland Kitchen atorvastatin (LIPITOR) 40 MG tablet Take 40 mg by mouth daily.    Marland Kitchen azithromycin (ZITHROMAX) 250 MG tablet     . BCG vaccine 81 mg in sodium chloride 0.9 % 50 mL Instill 81 mg into the bladder every 30 (thirty) days.    . carvedilol (COREG) 6.25 MG tablet Take 1 tablet (6.25 mg total) by mouth 2 (two) times daily. 60 tablet 6  . CIALIS 20 MG tablet     . escitalopram (LEXAPRO) 20 MG tablet Take 20 mg by mouth daily.    . fluticasone (FLONASE) 50 MCG/ACT nasal spray FLUTICASONE PROPIONATE 50 MCG/ACT SUSP    . latanoprost (XALATAN) 0.005 % ophthalmic solution Place 1 drop into both eyes every morning.     . Multiple Vitamins-Minerals (CENTRUM SILVER PO) Take 1 tablet by mouth every morning.     . Multiple Vitamins-Minerals (MENS MULTIVITAMIN PLUS PO)     . Multiple Vitamins-Minerals (PRESERVISION AREDS 2 PO) Take by mouth.    . Omega-3 Fatty Acids ([employer] FISH OIL PO) 1,000 mg.    . Omega-3 Fatty Acids (FISH OIL) 1000 MG CAPS Take 1,000 mg by mouth 2 (two) times daily.    Marland Kitchen OVER THE COUNTER MEDICATION Redd2- 1 tablet bid- for macular degeneration    . pantoprazole (PROTONIX) 40 MG tablet Take 1 tablet (40 mg total) by mouth daily. 30 tablet 11  . tamsulosin (FLOMAX) 0.4 MG CAPS capsule  Take 1 capsule (0.4 mg total) by mouth daily. 30 capsule 0  . testosterone cypionate (DEPOTESTOTERONE CYPIONATE) 100 MG/ML  injection Inject 300 mg into the muscle every 14 (fourteen) days. For IM use only     No current facility-administered medications for this visit.    Allergies as of 09/09/2015 - Review Complete 09/09/2015  Allergen Reaction Noted  . Pollen extract Cough 01/13/2013    Vitals: BP 150/90 mmHg  Pulse 68  Resp 20  Ht 6\' 4"  (1.93 m)  Wt 272 lb (123.378 kg)  BMI 33.12 kg/m2 Last Weight:  Wt Readings from Last 1 Encounters:  09/09/15 272 lb (123.378 kg)   Last Height:   Ht Readings from Last 1 Encounters:  09/09/15 6\' 4"  (1.93 m)     General: The patient is awake, alert and appears  in acute distress from hay fever and sinusitis. Nasal congestion is noted.  The patient is well groomed. He has visible pressure marks.  Head: Normocephalic, atraumatic. Neck is supple. 21 cm circumference.   Mallampati 3, neck circumference: unchanged.  Retrognathia is noted .  Cardiovascular: Regular rate and rhythm, without murmurs or carotid bruit, and without distended neck veins. Respiratory: Lungs are clear to auscultation. He has faster RR 23- min.  Skin:  Swollen ankles, affecting the ankle sensation for vibration. .   Trunk: BMI remains elevated,  he has an erect posture.   Neurologic exam : The patient is awake and alert, oriented to place and time.   Memory subjective  described as intact.  There is a normal attention span & concentration ability. Speech is fluent without dysarthria, dysphonia or aphasia.  Mood and affect are appropriate.  Cranial nerves: Pupils are equal and briskly reactive to light.   Nystagmus with gaze to the left , unrestricted visual field .  Visual fields by finger perimetry are intact.  Facial motor strength is symmetric and tongue and uvula move midline.  Motor exam: Normal tone and normal muscle bulk and symmetric normal strength in  all extremities.  Sensory:  Fine touch, pinprick and vibration were tested in all extremities. Proprioception is normal.  Coordination: Rapid alternating movements in the fingers/hands is tested and normal.  Gait and station: Patient walks without assistive device .  Deep tendon reflexes: in the  upper and lower extremities are symmetric and intact. Babinski maneuver  downgoing.   Assessment:  After physical and neurologic examination, review of laboratory studies, imaging, neurophysiology testing and pre-existing records, assessment will be reviewed on the problem list. Sinusitis with clear serose otitis, rhinitis and vestibular involvement. He has taken a z pack and steroids, gained weight, larger neck and more puffy feet.   OSA - on CPAP well controlled. AHI 1.4  Obesity , not reduced over the last 24 month.   Plan:  Treatment plan and additional workup :  New hose,  new filter for CPAP, new nasal pillow needed after this period of allergic/ inflammatory sinusitis.  Follow up with Dr. Dagmar Hait is planned for December.  Retirement planned for January 1.  CPAP 8 cm ,  2 cm EPR - [facility] was DME, he changes to [facility].    Send order to [facility]. Information and education were provided in previous visit.  Rv in 12 month.

## 2015-09-17 ENCOUNTER — Encounter: Payer: Self-pay | Admitting: Neurology

## 2015-11-04 DIAGNOSIS — B029 Zoster without complications: Secondary | ICD-10-CM | POA: Insufficient documentation

## 2015-11-12 DIAGNOSIS — B0229 Other postherpetic nervous system involvement: Secondary | ICD-10-CM | POA: Insufficient documentation

## 2015-12-09 ENCOUNTER — Encounter: Payer: Self-pay | Admitting: Internal Medicine

## 2015-12-10 DIAGNOSIS — R7301 Impaired fasting glucose: Secondary | ICD-10-CM | POA: Insufficient documentation

## 2015-12-19 ENCOUNTER — Other Ambulatory Visit (HOSPITAL_COMMUNITY): Payer: Self-pay | Admitting: Internal Medicine

## 2015-12-19 ENCOUNTER — Ambulatory Visit (HOSPITAL_COMMUNITY)
Admission: RE | Admit: 2015-12-19 | Discharge: 2015-12-19 | Disposition: A | Discharge status: Home / Self Care | Payer: 59 | Source: Ambulatory Visit | Attending: Vascular Surgery | Admitting: Vascular Surgery

## 2015-12-19 DIAGNOSIS — E785 Hyperlipidemia, unspecified: Secondary | ICD-10-CM | POA: Insufficient documentation

## 2015-12-19 DIAGNOSIS — I10 Essential (primary) hypertension: Secondary | ICD-10-CM | POA: Insufficient documentation

## 2015-12-19 DIAGNOSIS — R42 Dizziness and giddiness: Secondary | ICD-10-CM | POA: Insufficient documentation

## 2015-12-19 DIAGNOSIS — R5383 Other fatigue: Secondary | ICD-10-CM | POA: Diagnosis not present

## 2015-12-24 ENCOUNTER — Encounter: Payer: Self-pay | Admitting: *Deleted

## 2016-01-06 ENCOUNTER — Other Ambulatory Visit: Payer: Self-pay | Admitting: Internal Medicine

## 2016-01-06 ENCOUNTER — Encounter: Payer: Self-pay | Admitting: Internal Medicine

## 2016-01-07 ENCOUNTER — Telehealth: Payer: Self-pay | Admitting: Internal Medicine

## 2016-01-07 MED ORDER — PANTOPRAZOLE SODIUM 40 MG PO TBEC: DELAYED_RELEASE_TABLET | ORAL | Status: DC

## 2016-01-07 NOTE — Telephone Encounter (Signed)
Refilled Pantoprazole 

## 2016-01-10 ENCOUNTER — Other Ambulatory Visit: Payer: Self-pay | Admitting: Cardiovascular Disease

## 2016-02-10 ENCOUNTER — Other Ambulatory Visit: Payer: Self-pay | Admitting: Cardiology

## 2016-02-18 ENCOUNTER — Ambulatory Visit (AMBULATORY_SURGERY_CENTER): Payer: 59 | Admitting: *Deleted

## 2016-02-18 VITALS — Ht 76.0 in | Wt 277.8 lb

## 2016-02-18 DIAGNOSIS — Z86010 Personal history of colonic polyps: Secondary | ICD-10-CM

## 2016-02-18 MED ORDER — NA SULFATE-K SULFATE-MG SULF 17.5-3.13-1.6 GM/177ML PO SOLN: 1.0000 | Freq: Once | ORAL | Status: DC

## 2016-02-18 NOTE — Progress Notes (Signed)
Denies allergies to eggs or soy products. Denies complications with sedation or anesthesia. Denies O2 use. Denies use of diet or weight loss medications.  Emmi instructions given for colonoscopy.  

## 2016-03-03 ENCOUNTER — Ambulatory Visit (AMBULATORY_SURGERY_CENTER): Payer: 59 | Admitting: Internal Medicine

## 2016-03-03 ENCOUNTER — Encounter: Payer: Self-pay | Admitting: Internal Medicine

## 2016-03-03 VITALS — BP 138/81 | HR 58 | Temp 97.1°F | Resp 13 | Ht 76.0 in | Wt 277.0 lb

## 2016-03-03 DIAGNOSIS — D125 Benign neoplasm of sigmoid colon: Secondary | ICD-10-CM

## 2016-03-03 DIAGNOSIS — K635 Polyp of colon: Secondary | ICD-10-CM

## 2016-03-03 DIAGNOSIS — Z86010 Personal history of colonic polyps: Secondary | ICD-10-CM

## 2016-03-03 MED ORDER — SODIUM CHLORIDE 0.9 % IV SOLN: 500.0000 mL | INTRAVENOUS | Status: DC

## 2016-03-03 NOTE — Patient Instructions (Signed)
Discharge instructions given. Handouts on diverticulosis and a high fiber diet Resume previous medications. YOU HAD AN ENDOSCOPIC PROCEDURE TODAY AT THE [facility]:   Refer to the procedure report that was given to you for any specific questions about what was found during the examination.  If the procedure report does not answer your questions, please call your gastroenterologist to clarify.  If you requested that your care partner not be given the details of your procedure findings, then the procedure report has been included in a sealed envelope for you to review at your convenience later.  YOU SHOULD EXPECT: Some feelings of bloating in the abdomen. Passage of more gas than usual.  Walking can help get rid of the air that was put into your GI tract during the procedure and reduce the bloating. If you had a lower endoscopy (such as a colonoscopy or flexible sigmoidoscopy) you may notice spotting of blood in your stool or on the toilet paper. If you underwent a bowel prep for your procedure, you may not have a normal bowel movement for a few days.  Please Note:  You might notice some irritation and congestion in your nose or some drainage.  This is from the oxygen used during your procedure.  There is no need for concern and it should clear up in a day or so.  SYMPTOMS TO REPORT IMMEDIATELY:   Following lower endoscopy (colonoscopy or flexible sigmoidoscopy):  Excessive amounts of blood in the stool  Significant tenderness or worsening of abdominal pains  Swelling of the abdomen that is new, acute  Fever of 100F or higher   For urgent or emergent issues, a gastroenterologist can be reached at any hour by calling [phone].   DIET: Your first meal following the procedure should be a small meal and then it is ok to progress to your normal diet. Heavy or fried foods are harder to digest and may make you feel nauseous or bloated.  Likewise, meals heavy in dairy and vegetables  can increase bloating.  Drink plenty of fluids but you should avoid alcoholic beverages for 24 hours.  ACTIVITY:  You should plan to take it easy for the rest of today and you should NOT DRIVE or use heavy machinery until tomorrow (because of the sedation medicines used during the test).    FOLLOW UP: Our staff will call the number listed on your records the next business day following your procedure to check on you and address any questions or concerns that you may have regarding the information given to you following your procedure. If we do not reach you, we will leave a message.  However, if you are feeling well and you are not experiencing any problems, there is no need to return our call.  We will assume that you have returned to your regular daily activities without incident.  If any biopsies were taken you will be contacted by phone or by letter within the next 1-3 weeks.  Please call us at [phone] if you have not heard about the biopsies in 3 weeks.    SIGNATURES/CONFIDENTIALITY: You and/or your care partner have signed paperwork which will be entered into your electronic medical record.  These signatures attest to the fact that that the information above on your After Visit Summary has been reviewed and is understood.  Full responsibility of the confidentiality of this discharge information lies with you and/or your care-partner. 

## 2016-03-03 NOTE — Progress Notes (Signed)
Called to room to assist during endoscopic procedure.  Patient ID and intended procedure confirmed with present staff. Received instructions for my participation in the procedure from the performing physician.  

## 2016-03-03 NOTE — Progress Notes (Signed)
A/ox3 pleased with MAC, report to Celia RN 

## 2016-03-03 NOTE — Op Note (Signed)
[facility] Patient Name: Brian Cortez Procedure Date: 03/03/2016 9:04 AM MRN: [id number] Endoscopist: Docia Chuck. Henrene Pastor , MD Age: 65 Date of Birth: [birth year] Gender: Male Procedure:                Colonoscopy with snare polypectomy -1 Indications:              High risk colon cancer surveillance: Personal                            history of colonic polyps. Previous examinations                            2001, 2007, and 2012 with adenomatous and                            hyperplastic polyps Medicines:                Monitored Anesthesia Care Procedure:                Pre-Anesthesia Assessment:                           - Prior to the procedure, a History and Physical                            was performed, and patient medications and                            allergies were reviewed. The patient's tolerance of                            previous anesthesia was also reviewed. The risks                            and benefits of the procedure and the sedation                            options and risks were discussed with the patient.                            All questions were answered, and informed consent                            was obtained. Prior Anticoagulants: The patient has                            taken no previous anticoagulant or antiplatelet                            agents. ASA Grade Assessment: II - A patient with                            mild systemic disease. After reviewing the risks  and benefits, the patient was deemed in                            satisfactory condition to undergo the procedure.                           After obtaining informed consent, the colonoscope                            was passed under direct vision. Throughout the                            procedure, the patient's blood pressure, pulse, and                            oxygen saturations were monitored continuously. The               Model CF-HQ190L [id number]) scope was introduced                            through the anus and advanced to the the cecum,                            identified by appendiceal orifice and ileocecal                            valve. The colonoscopy was performed without                            difficulty. The patient tolerated the procedure                            well. The quality of the bowel preparation was                            good. The bowel preparation used was SUPREP. The                            ileocecal valve, appendiceal orifice, and rectum                            were photographed. Scope In: 9:15:15 AM Scope Out: 9:30:17 AM Scope Withdrawal Time: 0 hours 12 minutes 15 seconds  Total Procedure Duration: 0 hours 15 minutes 2 seconds  Findings:                 The digital rectal exam was normal.                           A 5 mm polyp was found in the sigmoid colon. The                            polyp was removed with a cold snare. Resection and  retrieval were complete.                           Multiple medium-mouthed diverticula were found in                            the sigmoid colon.                           The exam was otherwise without abnormality on                            direct and retroflexion views. Complications:            No immediate complications. Estimated Blood Loss:     Estimated blood loss: none. Impression:               - One 5 mm polyp in the sigmoid colon, removed with                            a cold snare. Resected and retrieved.                           - Diverticulosis in the sigmoid colon.                           - The examination was otherwise normal on direct                            and retroflexion views. Recommendation:           - Patient has a contact number available for                            emergencies. The signs and symptoms of potential                             delayed complications were discussed with the                            patient. Return to normal activities tomorrow.                            Written discharge instructions were provided to the                            patient.                           - Resume previous diet.                           - Continue present medications.                           - Await pathology results.                           -  Repeat colonoscopy in 5-10 years for surveillance. Docia Chuck. Henrene Pastor, MD 03/03/2016 9:36:12 AM This report has been signed electronically. CC Letter to:             Ravisankar Avva

## 2016-03-04 ENCOUNTER — Telehealth: Payer: Self-pay

## 2016-03-04 NOTE — Telephone Encounter (Signed)
  Follow up Call-  Call back number 03/03/2016  Post procedure Call Back phone  # [phone]  Permission to leave phone message Yes     Patient questions:  Do you have a fever, pain , or abdominal swelling? No. Pain Score  0 *  Have you tolerated food without any problems? Yes.    Have you been able to return to your normal activities? Yes.    Do you have any questions about your discharge instructions: Diet   No. Medications  No. Follow up visit  No.  Do you have questions or concerns about your Care? No.  Actions: * If pain score is 4 or above: No action needed, pain <4.

## 2016-03-09 ENCOUNTER — Encounter: Payer: Self-pay | Admitting: Internal Medicine

## 2016-04-21 ENCOUNTER — Ambulatory Visit (INDEPENDENT_AMBULATORY_CARE_PROVIDER_SITE_OTHER): Payer: 59 | Admitting: Cardiology

## 2016-04-21 ENCOUNTER — Encounter: Payer: Self-pay | Admitting: Cardiology

## 2016-04-21 VITALS — BP 122/70 | HR 75 | Ht 76.0 in | Wt 280.8 lb

## 2016-04-21 DIAGNOSIS — I429 Cardiomyopathy, unspecified: Secondary | ICD-10-CM

## 2016-04-21 DIAGNOSIS — E782 Mixed hyperlipidemia: Secondary | ICD-10-CM

## 2016-04-21 NOTE — Progress Notes (Signed)
Patient ID: Brian Cortez, male   DOB: December 11, 1950, 65 y.o.   MRN: [id number] PCP: Dr. Dagmar Hait  66 yo with history of hyperlipidemia and HTN returns for cardiology evaluation.  I initially saw him after several episodes of profound fatigue.  Echo was done at that time in 9/14 showing EF 45-50% with mild global hypokinesis.  Given the symptoms of profound tiredness, the coronary artery calcification on a chest CT, and the mildly decreased LV systolic function, I had him do an ETT-Cardiolite in 10/14.  This was actually a normal study with 8:29 exercise, no ECG changes, and no evidence for ischemia or infarction.  Interestingly, EF was 63% on the Cardiolite (contrasting with the echo).  Event monitor showed no significant events.  I started him on Coreg and he did not have any more episodes of fatigue.  I also started lisinopril.  Last echo in 1/16 showed EF 50-55%.    Generally feeling good now.  Retired this year and doing a lot of traveling.  No chest pain.  He is not short of breath with exertion and can walk up steps without problems.  He does exercise classes several times a week.  BP is controlled on current regimen.  Rare palpitations. No orthopnea/PND.   ECG: NSR, PVC, nonspecific T wave flattening  Labs (9/14): K 4.6, creatinine 1.1, LDL 85, HDL 41, LFTs normal, troponin negative x 3, BNP normal Labs (1/17): K 4.7, creatinine 1.1, HCT 49.7, LDL 45, HDL 30, TGs 248  PMH: 1. OSA on CPAP 2. Hyperlipidemia 3. Depression 4. GERD 5. HTN 6. Low testosterone 7. Colonic polyps 8. CAD: Coronary calcification on CT chest in 9/14.  ETT-Cardiolite (10/14) with 8:29 exercise, no ischemic ECG changes, no ischemia or infarction on Cardiolite images.   9. Cardiomyopathy: Echo (9/14) with EF 45-50%, mild LVH, diffuse hypokinesis, moderate diastolic dysfunction, normal RV size with mildly decreased systolic function.  - Echo (1/16): EF 50-55%, mild LVH, PASP 31 mmHg, normal RV size and systolic function.   10. PNA 10/15 11. Bladder cancer: Resection in 12/14, also had BCG.  12. Event monitor 10/14 with no significant events.  13. Carotid dopplers (1/17) with no significant disease.   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   FH: No history of MI.  Father had CVA (was a smoker).   ROS: All systems reviewed and negative except as per HPI.   Current Outpatient Prescriptions  Medication Sig Dispense Refill  . AMBULATORY NON FORMULARY MEDICATION CPAP Machine    . aspirin 81 MG tablet Take 81 mg by mouth every evening.     Marland Kitchen atorvastatin (LIPITOR) 40 MG tablet Take 40 mg by mouth daily.    . carvedilol (COREG) 6.25 MG tablet Take 1 tablet (6.25 mg total) by mouth 2 (two) times daily. 60 tablet 2  . CIALIS 20 MG tablet Take 20 mg by mouth daily as needed for erectile dysfunction.     Marland Kitchen escitalopram (LEXAPRO) 20 MG tablet Take 20 mg by mouth daily.    . fluticasone (FLONASE) 50 MCG/ACT nasal spray FLUTICASONE PROPIONATE 50 MCG/ACT SUSP    . latanoprost (XALATAN) 0.005 % ophthalmic solution Place 1 drop into both eyes every morning.     . Multiple Vitamins-Minerals (CENTRUM SILVER PO) Take 1 tablet by mouth every morning.     . Multiple Vitamins-Minerals (PRESERVISION AREDS 2 PO) Take 1 capsule by mouth daily.     Marland Kitchen  Omega-3 Fatty Acids (FISH OIL) 1000 MG CAPS Take 1,000 mg by mouth 2 (two) times daily.    . pantoprazole (PROTONIX) 40 MG tablet TAKE ONE TABLET EACH DAY 30 tablet 6  . tamsulosin (FLOMAX) 0.4 MG CAPS capsule Take 1 capsule (0.4 mg total) by mouth daily. 30 capsule 0  . testosterone cypionate (DEPOTESTOTERONE CYPIONATE) 100 MG/ML injection Inject 300 mg into the muscle every 14 (fourteen) days. For IM use only    . lisinopril (PRINIVIL,ZESTRIL) 20 MG tablet Take 1 tablet (20 mg total) by mouth 2 (two) times daily.     No current facility-administered medications for this visit.    BP 122/70 mmHg  Pulse  75  Ht 6\' 4"  (1.93 m)  Wt 280 lb 12.8 oz (127.37 kg)  BMI 34.19 kg/m2 General: NAD Neck: Thick, no JVD, no thyromegaly or thyroid nodule.  Lungs: Clear to auscultation bilaterally with normal respiratory effort. CV: Nondisplaced PMI.  Heart regular S1/S2, no S3/S4, no murmur.  Trace ankle edema.  No carotid bruit.  Normal pedal pulses.  Abdomen: Soft, nontender, no hepatosplenomegaly, no distention.  Skin: Intact without lesions or rashes.  Neurologic: Alert and oriented x 3.  Psych: Normal affect. Extremities: No clubbing or cyanosis.   Assessment/Plan: 1. CAD: Noted on CT chest (coronary calcification).  No ischemic symptoms since last appointment.  ETT-Cardiolite was normal in 2014.  At this time, would have him continue ASA 81, Coreg, and Crestor without further testing. 2. Cardiomyopathy: Mildly depressed LV and RV systolic function on 123456 echo.  The mild RV dysfunction could presumably be due to OSA with some pulmonary HTN, but I cannot explain the LV systolic dysfunction.  I did not find evidence for significant obstructive CAD on Cardiolite.  Repeat echo in 1/16 with EF 50-55%.  No exertional symptoms.  - Continue Coreg 6.25 mg bid and lisinopril 20 mg bid.  - Repeat echo in 1 year to follow LV and RV function.    3. Hyperlipidemia: Patient has CAD by calcium in coronaries on CT. Excellent LDL in 1/17 but TGs high.  Continue atorvastatin and has been working on diet.    Loralie Champagne 04/21/2016

## 2016-04-21 NOTE — Patient Instructions (Signed)
Medication Instructions:   Try Robitussin DM for your cough-you do not need a prescription for this-this is a safer alternative to Hydromet.   Labwork: none  Testing/Procedures: Your physician has requested that you have an echocardiogram. Echocardiography is a painless test that uses sound waves to create images of your heart. It provides your doctor with information about the size and shape of your heart and how well your heart's chambers and valves are working. This procedure takes approximately one hour. There are no restrictions for this procedure. IN 1 YEAR A FEW DAYS BEFORE YOU SEE DR [facility]    Follow-Up: Your physician wants you to follow-up in: 1 year with Dr Aundra Dubin. (May 2018).  You will receive a reminder letter in the mail two months in advance. If you don't receive a letter, please call our office to schedule the follow-up appointment.        If you need a refill on your cardiac medications before your next appointment, please call your pharmacy.

## 2016-04-23 DIAGNOSIS — L812 Freckles: Secondary | ICD-10-CM | POA: Diagnosis not present

## 2016-04-23 DIAGNOSIS — D225 Melanocytic nevi of trunk: Secondary | ICD-10-CM | POA: Diagnosis not present

## 2016-04-23 DIAGNOSIS — D1801 Hemangioma of skin and subcutaneous tissue: Secondary | ICD-10-CM | POA: Diagnosis not present

## 2016-04-23 DIAGNOSIS — I8391 Asymptomatic varicose veins of right lower extremity: Secondary | ICD-10-CM | POA: Diagnosis not present

## 2016-04-23 DIAGNOSIS — L570 Actinic keratosis: Secondary | ICD-10-CM | POA: Diagnosis not present

## 2016-04-23 DIAGNOSIS — L821 Other seborrheic keratosis: Secondary | ICD-10-CM | POA: Diagnosis not present

## 2016-05-05 DIAGNOSIS — E291 Testicular hypofunction: Secondary | ICD-10-CM | POA: Diagnosis not present

## 2016-05-07 ENCOUNTER — Other Ambulatory Visit: Payer: Self-pay | Admitting: Cardiology

## 2016-05-07 DIAGNOSIS — H401133 Primary open-angle glaucoma, bilateral, severe stage: Secondary | ICD-10-CM | POA: Diagnosis not present

## 2016-05-07 DIAGNOSIS — Z961 Presence of intraocular lens: Secondary | ICD-10-CM | POA: Diagnosis not present

## 2016-05-19 DIAGNOSIS — E291 Testicular hypofunction: Secondary | ICD-10-CM | POA: Diagnosis not present

## 2016-05-23 DIAGNOSIS — J019 Acute sinusitis, unspecified: Secondary | ICD-10-CM | POA: Diagnosis not present

## 2016-06-02 DIAGNOSIS — E291 Testicular hypofunction: Secondary | ICD-10-CM | POA: Diagnosis not present

## 2016-06-04 DIAGNOSIS — R7301 Impaired fasting glucose: Secondary | ICD-10-CM | POA: Diagnosis not present

## 2016-06-04 DIAGNOSIS — I10 Essential (primary) hypertension: Secondary | ICD-10-CM | POA: Diagnosis not present

## 2016-06-21 DIAGNOSIS — I10 Essential (primary) hypertension: Secondary | ICD-10-CM | POA: Diagnosis not present

## 2016-06-21 DIAGNOSIS — E784 Other hyperlipidemia: Secondary | ICD-10-CM | POA: Diagnosis not present

## 2016-06-21 DIAGNOSIS — Z23 Encounter for immunization: Secondary | ICD-10-CM | POA: Diagnosis not present

## 2016-06-21 DIAGNOSIS — J309 Allergic rhinitis, unspecified: Secondary | ICD-10-CM | POA: Diagnosis not present

## 2016-06-21 DIAGNOSIS — I5189 Other ill-defined heart diseases: Secondary | ICD-10-CM | POA: Diagnosis not present

## 2016-06-21 DIAGNOSIS — R748 Abnormal levels of other serum enzymes: Secondary | ICD-10-CM | POA: Diagnosis not present

## 2016-06-21 DIAGNOSIS — R7301 Impaired fasting glucose: Secondary | ICD-10-CM | POA: Diagnosis not present

## 2016-06-22 DIAGNOSIS — E291 Testicular hypofunction: Secondary | ICD-10-CM | POA: Diagnosis not present

## 2016-07-01 ENCOUNTER — Encounter: Payer: Self-pay | Admitting: Cardiology

## 2016-07-06 DIAGNOSIS — E291 Testicular hypofunction: Secondary | ICD-10-CM | POA: Diagnosis not present

## 2016-07-20 DIAGNOSIS — E291 Testicular hypofunction: Secondary | ICD-10-CM | POA: Diagnosis not present

## 2016-08-04 DIAGNOSIS — E291 Testicular hypofunction: Secondary | ICD-10-CM | POA: Diagnosis not present

## 2016-08-05 ENCOUNTER — Other Ambulatory Visit: Payer: Self-pay | Admitting: Internal Medicine

## 2016-08-18 DIAGNOSIS — E291 Testicular hypofunction: Secondary | ICD-10-CM | POA: Diagnosis not present

## 2016-08-21 DIAGNOSIS — Z23 Encounter for immunization: Secondary | ICD-10-CM | POA: Diagnosis not present

## 2016-09-01 DIAGNOSIS — E291 Testicular hypofunction: Secondary | ICD-10-CM | POA: Diagnosis not present

## 2016-09-08 ENCOUNTER — Ambulatory Visit (INDEPENDENT_AMBULATORY_CARE_PROVIDER_SITE_OTHER): Payer: PPO | Admitting: Neurology

## 2016-09-08 ENCOUNTER — Encounter: Payer: Self-pay | Admitting: Neurology

## 2016-09-08 VITALS — BP 150/88 | HR 72 | Resp 20 | Ht 76.0 in | Wt 282.0 lb

## 2016-09-08 DIAGNOSIS — G4733 Obstructive sleep apnea (adult) (pediatric): Secondary | ICD-10-CM

## 2016-09-08 DIAGNOSIS — J3489 Other specified disorders of nose and nasal sinuses: Secondary | ICD-10-CM

## 2016-09-08 DIAGNOSIS — Z9989 Dependence on other enabling machines and devices: Secondary | ICD-10-CM

## 2016-09-08 DIAGNOSIS — J301 Allergic rhinitis due to pollen: Secondary | ICD-10-CM | POA: Insufficient documentation

## 2016-09-08 DIAGNOSIS — R0981 Nasal congestion: Secondary | ICD-10-CM

## 2016-09-08 NOTE — Progress Notes (Signed)
[facility]  Provider:  Larey Cortez, M D  Referring Provider: Prince Solian, MD Primary Care Physician:  Brian Ringer, MD  Chief Complaint  Patient presents with  . Follow-up    cpap going well    HPI:  Brian Cortez is a 65 y.o. male seen here as a revisit  from Dr. Dagmar Cortez,  Fatigue and lightheadedness were improved, until last month. He has no longer nocturia since being on CPAP. In 2016 he went to the ED and was diagnosed with diastolic heart failure, had a nuclear test with Dr. Loralie Cortez - improved on Coreg.  His medical history has changed drastically over the last 12 month, he suffered a congestive heart failure episode and has been controlled by Coreg.  His bladder cancer was discovered in Dec 2014. Underwent BCG treatment. Has a 30% re-occurrence risk. He had pneumonia and changed PCPs.   Download 08-27-14  The data collection began on 07-28-14 and ended on 08-26-14 CPAP is used at 8 cm water pressure with 2 cm EPR the residual AHI is 1.6 which is excellent,  the patient uses a machine on average 8 hours and 24 minutes at night and has 100% compliance for nightly use  over 4 hours. Median daily usage 8 hours 11 minutes.  Interval history history from 09-09-15, the patient's 100% compliant for the number of 30 days and each of these days over 4 hours of consecutive use has been registered. On average he uses his machine for 8 hours and 47 minutes set pressure is 8 cm water with 2 cm expiratory pressure relief ( EPR) the residual AHI is 1.4 he has minimal air leaks, his Epworth sleepiness score today is 1. fatigue severity 6 points and the geriatric depression score is endorsed at one point.  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 When I saw him last year he had been suffering from pneumonia and his primary care physician, Dr. Elsworth Cortez, had treated him. The patient is a bladder  cancer survivor and has been diagnosed with diastolic heart failure. His cardiac function had improved on Coreg. I have followed him for obstructive sleep apnea and he is a CPAP compliant patient with 100% of use of his CPAP at average use of 8 hours and 54 minutes nightly. CPAP is set at 8 cm water pressure this 2 cm EPR and a residual AHI of 1.4. Minor air leaks are noted, these have no clinical bearing.    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  Sleeping better, SOB today, coughing. has ciliary injection, hay fever.  Severe  allergic rhinitis. Obese. The Epworth sleepiness score is 2 and the fatigue severity score is 18 points, the geriatric depression score is 2 out of 15.    Social History   Social History  . Marital status: Married    Spouse name: Brian Cortez  . Number of children: 2  . Years of education: Post Grad   Occupational History  . [place]  . [employer]   Social History Main Topics  . Smoking status: Never Smoker  . Smokeless tobacco: Never Used  . Alcohol use 0.0 oz/week     Comment: occasional  .  Drug use: No  . Sexual activity: Not on file   Other Topics Concern  . Not on file   Social History Narrative   Patient lives at home with spouse.    Caffeine Use: 3-4 cups    Family History  Problem Relation Age of Onset  . Heart attack Father 57  . Stroke Father 52  . Heart attack Paternal Grandfather     in 8s  . Prostate cancer Paternal Uncle   . Heart attack Paternal Uncle      > 55  . Colon cancer Neg Hx   . Diabetes Neg Hx   . Heart attack Maternal Uncle 80    > 55    Past Medical History:  Diagnosis Date  . Anxiety    PMH of  . Bladder cancer ([place])   . Carpal tunnel syndrome    LUE  .  Cholelithiasis   . Colonic polyp    adenomatous, PMH of Dr Henrene Pastor  . DDD (degenerative disc disease), cervical   . Diastolic heart failure ([place])    controlled with carvedilol per pt  . Diverticulosis   . GERD (gastroesophageal reflux disease)   . Gilbert syndrome   . Hemorrhoids   . Hyperglycemia 2001   w/ normal A1c   . Hyperlipidemia   . Hypertension   . Liver enzyme elevation 2004  . Obesity   . Pneumonia   . Sleep apnea, obstructive 02/20/2013   CPAP since 12-2012 . 9 cm water , respicare .  Marland Kitchen Testosterone deficiency   . Urethral stricture    surgery ; Dr Reece Agar    Past Surgical History:  Procedure Laterality Date  . CATARACT EXTRACTION, BILATERAL  2&01/2012  . CHOLECYSTECTOMY  11/07  . CYSTOSCOPY     X2  . FACIAL FRACTURE SURGERY  1987   blow out fracture & occipital fracture from basketball injuly   . ORBITAL FRACTURE SURGERY     left eye: 1986  . POLYPECTOMY  2007 & 2012   X2 ; Dr Henrene Pastor  . TONSILLECTOMY    . TRANSURETHRAL RESECTION OF BLADDER TUMOR WITH GYRUS (TURBT-GYRUS) N/A 11/06/2013   Procedure: TRANSURETHRAL RESECTION OF BLADDER TUMOR WITH GYRUS (TURBT-GYRUS);  Surgeon: Ailene Rud, MD;  Location: [facility];  Service: Urology;  Laterality: N/A;  . URETHRAL DILATION     secondary to trauma; Dr Reece Agar  . VARICOSE VEIN SURGERY  2002  . VASECTOMY      Current Outpatient Prescriptions  Medication Sig Dispense Refill  . AMBULATORY NON FORMULARY MEDICATION CPAP Machine    . aspirin 81 MG tablet Take 81 mg by mouth every evening.     Marland Kitchen atorvastatin (LIPITOR) 40 MG tablet Take 40 mg by mouth daily.    . carvedilol (COREG) 6.25 MG tablet Take 1 tablet (6.25 mg total) by mouth 2 (two) times daily. 60 tablet 11  . CIALIS 20 MG tablet Take 20 mg by mouth daily as needed for erectile dysfunction.     Marland Kitchen escitalopram (LEXAPRO) 20 MG tablet Take 20 mg by mouth daily.    . fluticasone (FLONASE) 50 MCG/ACT nasal spray FLUTICASONE PROPIONATE 50 MCG/ACT SUSP    .  latanoprost (XALATAN) 0.005 % ophthalmic solution Place 1 drop into both eyes every morning.     Marland Kitchen lisinopril (PRINIVIL,ZESTRIL) 20 MG tablet Take 1 tablet (20 mg total) by mouth 2 (two) times daily.    . Multiple Vitamins-Minerals (CENTRUM SILVER PO) Take 1 tablet by mouth every morning.     Marland Kitchen  Multiple Vitamins-Minerals (PRESERVISION AREDS 2 PO) Take 1 capsule by mouth daily.     . Omega-3 Fatty Acids (FISH OIL) 1000 MG CAPS Take 1,000 mg by mouth 2 (two) times daily.    . pantoprazole (PROTONIX) 40 MG tablet TAKE ONE TABLET EACH DAY 30 tablet 3  . tamsulosin (FLOMAX) 0.4 MG CAPS capsule Take 1 capsule (0.4 mg total) by mouth daily. 30 capsule 0  . testosterone cypionate (DEPOTESTOTERONE CYPIONATE) 100 MG/ML injection Inject 300 mg into the muscle every 14 (fourteen) days. For IM use only     No current facility-administered medications for this visit.     Allergies as of 09/08/2016 - Review Complete 09/08/2016  Allergen Reaction Noted  . Pollen extract Cough 01/13/2013    Vitals: BP (!) 150/88   Pulse 72   Resp 20   Ht 6\' 4"  (1.93 m)   Wt 282 lb (127.9 kg)   BMI 34.33 kg/m  Last Weight:  Wt Readings from Last 1 Encounters:  09/08/16 282 lb (127.9 kg)   Last Height:   Ht Readings from Last 1 Encounters:  09/08/16 6\' 4"  (1.93 m)     General: The patient is awake, alert and appears  in acute distress from hay fever and sinusitis. Nasal congestion is noted.  The patient is well groomed. He has visible pressure marks.  Head: Normocephalic, atraumatic. Neck is supple. 21 cm circumference.   Mallampati 3, neck circumference: unchanged.  Retrognathia is noted .  Cardiovascular: Regular rate and rhythm, without murmurs or carotid bruit, and without distended neck veins. Respiratory: Lungs are clear to auscultation. He has faster RR 23- min.  Skin:  Swollen ankles, affecting the ankle sensation for vibration. .   Trunk: BMI remains elevated,  he has an erect  posture.   Neurologic exam : The patient is awake and alert, oriented to place and time.   Memory subjective  described as intact.  There is a normal attention span & concentration ability. Speech is fluent without dysarthria, dysphonia or aphasia.  Mood and affect are appropriate.  Cranial nerves: Pupils are equal and briskly reactive to light.   Nystagmus with gaze to the left , unrestricted visual field .  Visual fields by finger perimetry are intact.  Facial motor strength is symmetric and tongue and uvula move midline.   Assessment:  After physical and neurologic examination, review of laboratory studies, imaging, neurophysiology testing and pre-existing records, assessment will be reviewed on the problem list. Sinusitis with clear serose otitis, rhinitis and vestibular involvement. He has taken a z pack and steroids, gained weight, larger neck and more puffy feet.   OSA -  100% compliance 08-2016 , on CPAP well controlled.  AHI 1.4 again, stable for the last 3 years.  Obesity , not reduced over the last 36 month.  Enlarged neck,  Nasal congestion.   Plan:  Treatment plan and additional workup :  New hose,  new filter for CPAP, new nasal pillow needed after this period of allergic/ inflammatory sinusitis.  Follow up with Dr. Dagmar Cortez is planned for December. Retirement planned for January 1.  CPAP 8 cm ,  2 cm EPR -  [facility] , Linzie Collin RRT. Information and education were provided in previous visit.  Rv in 12 month, for follow up on compliance, which has been excellent. Marland Kitchen    Kimika Streater, MD  Dr Brian Cortez.

## 2016-09-08 NOTE — Patient Instructions (Signed)
Hay Fever Hay fever is an allergic reaction to particles in the air. It cannot be passed from person to person. It cannot be cured, but it can be controlled. CAUSES  Hay fever is caused by something that triggers an allergic reaction (allergens). The following are examples of allergens:  Ragweed.  Feathers.  Animal dander.  Grass and tree pollens.  Cigarette smoke.  House dust.  Pollution. SYMPTOMS   Sneezing.  Runny or stuffy nose.  Tearing eyes.  Itchy eyes, nose, mouth, throat, skin, or other area.  Sore throat.  Headache.  Decreased sense of smell or taste. DIAGNOSIS Your caregiver will perform a physical exam and ask questions about the symptoms you are having.Allergy testing may be done to determine exactly what triggers your hay fever.  TREATMENT   Over-the-counter medicines may help symptoms. These include:  Antihistamines.  Decongestants. These may help with nasal congestion.  Your caregiver may prescribe medicines if over-the-counter medicines do not work.  Some people benefit from allergy shots when other medicines are not helpful. HOME CARE INSTRUCTIONS   Avoid the allergen that is causing your symptoms, if possible.  Take all medicine as told by your caregiver. SEEK MEDICAL CARE IF:   You have severe allergy symptoms and your current medicines are not helping.  Your treatment was working at one time, but you are now experiencing symptoms.  You have sinus congestion and pressure.  You develop a fever or headache.  You have thick nasal discharge.  You have asthma and have a worsening cough and wheezing. SEEK IMMEDIATE MEDICAL CARE IF:   You have swelling of your tongue or lips.  You have trouble breathing.  You feel lightheaded or like you are going to faint.  You have cold sweats.  You have a fever.   This information is not intended to replace advice given to you by your health care provider. Make sure you discuss any  questions you have with your health care provider.   Document Released: 11/08/2005 Document Revised: 01/31/2012 Document Reviewed: 05/21/2015 Elsevier Interactive Patient Education 2016 Elsevier Inc.  

## 2016-09-15 DIAGNOSIS — E291 Testicular hypofunction: Secondary | ICD-10-CM | POA: Diagnosis not present

## 2016-09-29 DIAGNOSIS — E291 Testicular hypofunction: Secondary | ICD-10-CM | POA: Diagnosis not present

## 2016-10-01 DIAGNOSIS — G4733 Obstructive sleep apnea (adult) (pediatric): Secondary | ICD-10-CM | POA: Diagnosis not present

## 2016-10-11 DIAGNOSIS — I10 Essential (primary) hypertension: Secondary | ICD-10-CM | POA: Diagnosis not present

## 2016-10-11 DIAGNOSIS — C679 Malignant neoplasm of bladder, unspecified: Secondary | ICD-10-CM | POA: Diagnosis not present

## 2016-10-11 DIAGNOSIS — J3089 Other allergic rhinitis: Secondary | ICD-10-CM | POA: Diagnosis not present

## 2016-10-11 DIAGNOSIS — Z6834 Body mass index (BMI) 34.0-34.9, adult: Secondary | ICD-10-CM | POA: Diagnosis not present

## 2016-10-11 DIAGNOSIS — R102 Pelvic and perineal pain: Secondary | ICD-10-CM | POA: Diagnosis not present

## 2016-10-13 DIAGNOSIS — E291 Testicular hypofunction: Secondary | ICD-10-CM | POA: Diagnosis not present

## 2016-10-20 DIAGNOSIS — H401113 Primary open-angle glaucoma, right eye, severe stage: Secondary | ICD-10-CM | POA: Diagnosis not present

## 2016-10-20 DIAGNOSIS — H401123 Primary open-angle glaucoma, left eye, severe stage: Secondary | ICD-10-CM | POA: Diagnosis not present

## 2016-10-21 ENCOUNTER — Encounter: Payer: Self-pay | Admitting: Neurology

## 2016-10-27 DIAGNOSIS — E291 Testicular hypofunction: Secondary | ICD-10-CM | POA: Diagnosis not present

## 2016-11-10 DIAGNOSIS — E291 Testicular hypofunction: Secondary | ICD-10-CM | POA: Diagnosis not present

## 2016-11-24 DIAGNOSIS — E291 Testicular hypofunction: Secondary | ICD-10-CM | POA: Diagnosis not present

## 2016-11-26 ENCOUNTER — Other Ambulatory Visit: Payer: Self-pay | Admitting: Internal Medicine

## 2016-11-26 DIAGNOSIS — R109 Unspecified abdominal pain: Secondary | ICD-10-CM | POA: Diagnosis not present

## 2016-11-26 DIAGNOSIS — K5730 Diverticulosis of large intestine without perforation or abscess without bleeding: Secondary | ICD-10-CM | POA: Diagnosis not present

## 2016-12-01 DIAGNOSIS — C670 Malignant neoplasm of trigone of bladder: Secondary | ICD-10-CM | POA: Diagnosis not present

## 2016-12-01 DIAGNOSIS — E291 Testicular hypofunction: Secondary | ICD-10-CM | POA: Diagnosis not present

## 2016-12-01 DIAGNOSIS — N5201 Erectile dysfunction due to arterial insufficiency: Secondary | ICD-10-CM | POA: Diagnosis not present

## 2016-12-02 ENCOUNTER — Other Ambulatory Visit: Payer: Self-pay | Admitting: Urology

## 2016-12-07 ENCOUNTER — Encounter (HOSPITAL_BASED_OUTPATIENT_CLINIC_OR_DEPARTMENT_OTHER): Payer: Self-pay | Admitting: *Deleted

## 2016-12-07 DIAGNOSIS — R8299 Other abnormal findings in urine: Secondary | ICD-10-CM | POA: Diagnosis not present

## 2016-12-07 DIAGNOSIS — E291 Testicular hypofunction: Secondary | ICD-10-CM | POA: Diagnosis not present

## 2016-12-07 DIAGNOSIS — E784 Other hyperlipidemia: Secondary | ICD-10-CM | POA: Diagnosis not present

## 2016-12-07 DIAGNOSIS — R7301 Impaired fasting glucose: Secondary | ICD-10-CM | POA: Diagnosis not present

## 2016-12-07 DIAGNOSIS — I10 Essential (primary) hypertension: Secondary | ICD-10-CM | POA: Diagnosis not present

## 2016-12-07 DIAGNOSIS — Z125 Encounter for screening for malignant neoplasm of prostate: Secondary | ICD-10-CM | POA: Diagnosis not present

## 2016-12-07 NOTE — Progress Notes (Signed)
NPO AFTER MN.  ARRIVE AT [id number].  NEEDS ISTAT .  CURRENT EKG IN CHART AND EPIC.  WILL TAKE PROTONIX, LEXOPRO, COREG, AND FLONASE AM DOS W/ SIPS OF WATER.

## 2016-12-08 NOTE — H&P (Signed)
Office Visit Report     12/01/2016   --------------------------------------------------------------------------------   Brian Cortez  MRN: [id number]  PRIMARY CARE:  Montez Morita, MD  DOB: 03/27/1951, 66 year old Male  REFERRING:  Citlally Captain I. Gaynelle Arabian, MD  SSN: [id number]  PROVIDER:  Carolan Clines, M.D.    LOCATION:  [facility] [phone]   --------------------------------------------------------------------------------   CC: I have bladder cancer that has been treated.  HPI: Brian Cortez is a 66 year-old male established patient who is here for follow-up of bladder cancer treatment.  His bladder cancer was superficial and limitied to the bladder lining. His bladder cancer was not muscle invasive. He had treatment with the following intravesical agents: bcg. Patient denies mitomycin, interferon, and adriamycin.   He did have a TURBT. His last bladder tumor was resected 11/06/2013. He has had the following number of bladder resections: 1. He did not have a radical cystectomy for the treatment of his bladder cancer. He did not have chemotherapy for treamtent of his bladder cancer. He did not have radiation to treat his bladder cancer. He is not here for an intravesical treatment today.   He has not had blood in his urine recently.   His last cysto was 11/10/2015. His last radiologic test to evaluate the kidneys was 11/26/2016.     CC: I have an enlarged prostate (follow-up).  HPI: He is currently on flomax for the symptoms due to the enlarged prostate gland. He is not on new medications for symptoms of prostate enlargement.   He does not have an abnormal sensation when needing to urinate. He is not having problems getting his urine stream started. He does have a good size and strength to his urinary stream. He is not having problems with emptying his bladder well. He does not dribble at the end of urination.     CC: interval evaluation of low testosterone  HPI:  The patient was last seen 11/10/2015. The patient is currently taking Testosterone injections for his testosterone replacement therapy.   Testosterone: 05/21/14 - 170 (trough).   PSA: 05/21/14 - 1.08.     CC: I have erectile dysfunction (Meds).  HPI: He is currently on Cialis for treatment of his erectile dysfunction. His medication allows him to achieve good erections.   He does not have trouble maintaining his erection.     AUA Symptom Score: He never has the sensation of not emptying his bladder completely after finishing urinating. He never has to urinate again less that two hours after he has finished urinating. He does not have to stop and start again several times when he urinates. He never finds it difficult to postpone urination. He never has a weak urinary stream. He never has to push or strain to begin urination. He never has to get up to urinate from the time he goes to bed until the time he gets up in the morning.   Calculated AUA Symptom Score: 0    QOL Score: He would feel delighted if he had to live with his urinary condition the way it is now for the rest of his life.   Calculated QOL Symptom Score: 0    ALLERGIES: No Allergies    MEDICATIONS: Cialis 20 mg tablet TAKE ONE TABLET AS NEEDED  Lipitor 40 mg tablet  Carvedilol 3.125 MG Oral Tablet Oral  Ciprofloxacin Hcl 500 mg tablet 1 tablet PO BID  Latanoprost 0.005 % Ophthalmic Solution Ophthalmic  Levitra 20 MG Oral Tablet 0 Oral As  needed  Lexapro TABS Oral  Lisinopril 10 MG Oral Tablet Oral  Multi-Day Vitamins TABS Oral  Protonix PACK Oral  Tadalafil 20Mg  1 troche PO as directed  Tamsulosin HCl - 0.4 MG Oral Capsule 0 Oral Bedtime  Testosterone Cypionate 200 MG/ML Intramuscular Solution 1.5 ml IM Q2WK     GU PSH: Cystoscopy TURBT >5 cm - 2014 Locm 300-399Mg /Ml Iodine,1Ml - 11/26/2016    NON-GU PSH: Cholecystectomy Eye Surgery (Unspecified)    GU PMH: 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,  Hypogonadism, testicular, - 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 Bladder Cancer Lateral, Cancer of lateral wall of urinary bladder - 11/10/2015 Bladder Cancer, Unspec, Stage I papillary adenocarcinoma of bladder - 07/15/2015 BPH w/o LUTS, Enlarged prostate without lower urinary tract symptoms (luts) - 07/15/2015 ED, arterial insufficiency, Erectile dysfunction due to arterial insufficiency - 07/15/2015 Urinary Tract Inf, Unspec site, Urinary tract infection - 07/04/2015 Other microscopic hematuria, Microscopic hematuria - 06/06/2015 Bladder, Neoplasm of Unspecified behavior, Bladder tumor - 2015 Acute Cystitis, Acute cystitis without hematuria - 2015 Urethral Stricture, Unspec, Urethral stricture - 2015 Gross hematuria, Gross hematuria - 2014 Personal Hx urinary calculi, Nephrolithiasis - 2014      PMH Notes:  2006-12-12 14:53:55 - Note: Urinary Symptoms  2013-08-23 0000000 - Note: Diastolic Congestive Heart Failure   NON-GU PMH: Postherpetic polyneuropathy, 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   Tobacco Use Assessment Completed: Used Tobacco in last 30 days? Drinks 2 drinks per day.  Drinks 3 caffeinated drinks per day. Patient's occupation Financial controller.     Notes: 2 children   REVIEW OF SYSTEMS:    GU Review Male:   Patient denies frequent urination, hard to postpone urination, burning/ pain with urination, get up at night to urinate, leakage of urine, stream starts and stops, trouble starting your stream, have to strain to urinate  , erection problems, and penile pain.  Gastrointestinal (Upper):   Patient denies nausea, vomiting, and indigestion/ heartburn.  Gastrointestinal (Lower):   LLQ abdominal pain. Patient denies diarrhea and constipation.  Constitutional:   Patient denies fever, night sweats, weight loss, and fatigue.  Skin:   Patient denies skin rash/ lesion and itching.  Eyes:   Patient denies blurred vision and double vision.  Ears/ Nose/ Throat:   Patient denies sore throat and sinus problems.  Hematologic/Lymphatic:   Patient denies swollen glands and easy bruising.  Cardiovascular:   Patient denies chest pains and leg swelling.  Respiratory:   Patient denies cough and shortness of breath.  Endocrine:   Patient denies excessive thirst.  Musculoskeletal:   Patient denies back pain and joint pain.  Neurological:   Patient denies headaches and dizziness.  Psychologic:   Patient denies depression and anxiety.   VITAL SIGNS:      12/01/2016 02:29 PM  Weight 285 lb / 129.27 kg  Height 76 in / 193.04 cm  BP 152/96 mmHg  Pulse 74 /min  BMI 34.7 kg/m   GU PHYSICAL EXAMINATION:    Anus and Perineum: No hemorrhoids. No anal stenosis. No rectal fissure, no anal fissure. No edema, no dimple, no perineal tenderness,  no anal tenderness.  Scrotum: No lesions. No edema. No cysts. No warts.  Epididymides: Right: no spermatocele, no masses, no cysts, no tenderness, no induration, no enlargement. Left: no spermatocele, no masses, no cysts, no tenderness, no induration, no enlargement.  Testes: No tenderness, no swelling, no enlargement left testes. No tenderness, no swelling, no enlargement right testes. Normal location left testes. Normal location right testes. No mass, no cyst, no varicocele, no hydrocele left testes. No mass, no cyst, no varicocele, no hydrocele right testes.  Urethral Meatus: Normal size. No lesion, no wart, no discharge, no polyp. Normal location.  Penis: Circumcised, no warts, no cracks. No dorsal  Peyronie's plaques, no left corporal Peyronie's plaques, no right corporal Peyronie's plaques, no scarring, no warts. No balanitis, no meatal stenosis.  Prostate: Prostate 3 + size. Left lobe normal consistency, right lobe normal consistency. Symmetrical lobes. No prostate nodule. Left lobe no tenderness, right lobe no tenderness.   Seminal Vesicles: Nonpalpable.  Sphincter Tone: Normal sphincter. No rectal tenderness. No rectal mass.    MULTI-SYSTEM PHYSICAL EXAMINATION:    Constitutional: Well-nourished. No physical deformities. Normally developed. Good grooming.  Neck: Neck symmetrical, not swollen. Normal tracheal position.  Respiratory: No labored breathing, no use of accessory muscles.   Cardiovascular: Normal temperature, normal extremity pulses, no swelling, no varicosities.  Lymphatic: No enlargement of neck, axillae, groin.  Skin: No paleness, no jaundice, no cyanosis. No lesion, no ulcer, no rash.  Neurologic / Psychiatric: Oriented to time, oriented to place, oriented to person. No depression, no anxiety, no agitation.  Gastrointestinal: No mass, no tenderness, no rigidity, non obese abdomen.  Eyes: Normal conjunctivae. Normal eyelids.  Ears, Nose, Mouth, and Throat: Left ear no scars, no lesions, no masses. Right ear no scars, no lesions, no masses. Nose no scars, no lesions, no masses. Normal hearing. Normal lips.  Musculoskeletal: Normal gait and station of head and neck.     PAST DATA REVIEWED:  Source Of History:  Patient  Records Review:   AUA Symptom Score, Previous Patient Records  X-Ray Review: C.T. Abdomen/Pelvis: Reviewed Films. Reviewed Report. Discussed With Patient.     05/21/14 02/08/13 06/16/12 02/02/11 04/17/09 01/06/09 08/02/06  PSA  Total PSA 1.08  0.72  1.08  0.63  0.48  0.59  0.29     05/21/14 02/08/13 12/05/12 09/22/12 04/07/12 09/29/11 03/23/11 02/02/11  Hormones  Testosterone, Total 170  246  206.94  519.33  415.47  274.45  739.94  424.49      12/01/16  Urinalysis  Urine Appearance Clear   Urine Color Yellow   Urine Glucose Neg   Urine Bilirubin Neg   Urine Ketones Neg   Urine Specific Gravity 1.025   Urine Blood 2+   Urine pH 5.5   Urine Protein Neg   Urine Urobilinogen 0.2   Urine Nitrites Neg   Urine Leukocyte Esterase Neg   Urine WBC/hpf 0 - 5/hpf   Urine RBC/hpf 3 - 10/hpf   Urine Epithelial Cells 0 - 5/hpf   Urine Bacteria Rare (0-9/hpf)   Urine Mucous Not Present   Urine Yeast NS (Not Seen)   Urine Trichomonas Not Present   Urine Cystals NS (Not Seen)   Urine Casts NS (Not Seen)   Urine Sperm Not Present    PROCEDURES:         Flexible Cystoscopy - 52000  Risks, benefits, and some of the potential complications of the procedure were discussed at length with the patient including infection, bleeding, voiding discomfort, urinary  retention, fever, chills, sepsis, and others. All questions were answered. Informed consent was obtained. Antibiotic prophylaxis was given. Sterile technique and intraurethral analgesia were used.  Meatus:  Normal size. Normal location. Normal condition.  Urethra:  No strictures.  External Sphincter:  Normal.  Verumontanum:  Normal.  Prostate:  Non-obstructing. No hyperplasia.  Bladder Neck:  Non-obstructing.  Ureteral Orifices:  Normal location. Normal size. Normal shape. Effluxed clear urine.  Bladder:  No trabeculation. Bldder base necrotic tumor ( trigone). Normal mucosa. No stones.      The lower urinary tract was carefully examined. The procedure was well-tolerated and without complications. Antibiotic instructions were given. Instructions were given to call the office immediately for bloody urine, difficulty urinating, urinary retention, painful or frequent urination, fever, chills, nausea, vomiting or other illness. The patient stated that he understood these instructions and would comply with them.         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 No viable tumor seen, but necrotic debris does not flush out of bladder. He needs TUR-BT to evlauate the base. He wil have as op. Possible mitomycin C instillation.     PLAN:            Medications New Meds: Levsin-Sl 0.125 mg tablet, sublingual 1 tablet Sublingual BID   #60  1 Refill(s)            Orders Labs PSA with Reflex, Hemoglobin and Hematocrit, Total Testosterone          Schedule Labs: 1 Year - Hemoglobin and Hematocrit    1 Year - Total Testosterone    1 Year - PSA with Reflex  Return Visit/Planned Activity: Next Available Appointment - Schedule Surgery          Document Letter(s):  Created for Patient: Clinical Summary    Signed by Carolan Clines, M.D. on 12/01/16 at 7:45 PM (EST)     The information contained in this medical record document is considered private and confidential patient information. This information can only be used for the medical diagnosis and/or medical services that are being provided by the patient's selected caregivers. This information can only be distributed outside of the patient's care if the patient agrees and signs waivers of authorization for this information to be sent to an outside source or route.

## 2016-12-09 ENCOUNTER — Encounter (HOSPITAL_BASED_OUTPATIENT_CLINIC_OR_DEPARTMENT_OTHER)
Admission: RE | Disposition: A | Discharge status: Home / Self Care | Payer: Self-pay | Source: Ambulatory Visit | Attending: Urology

## 2016-12-09 ENCOUNTER — Ambulatory Visit (HOSPITAL_BASED_OUTPATIENT_CLINIC_OR_DEPARTMENT_OTHER): Payer: PPO | Admitting: Anesthesiology

## 2016-12-09 ENCOUNTER — Encounter (HOSPITAL_BASED_OUTPATIENT_CLINIC_OR_DEPARTMENT_OTHER): Payer: Self-pay | Admitting: *Deleted

## 2016-12-09 ENCOUNTER — Ambulatory Visit (HOSPITAL_BASED_OUTPATIENT_CLINIC_OR_DEPARTMENT_OTHER)
Admission: RE | Admit: 2016-12-09 | Discharge: 2016-12-09 | Disposition: A | Discharge status: Home / Self Care | Payer: PPO | Source: Ambulatory Visit | Attending: Urology | Admitting: Urology

## 2016-12-09 DIAGNOSIS — N5201 Erectile dysfunction due to arterial insufficiency: Secondary | ICD-10-CM | POA: Diagnosis not present

## 2016-12-09 DIAGNOSIS — Z79899 Other long term (current) drug therapy: Secondary | ICD-10-CM | POA: Insufficient documentation

## 2016-12-09 DIAGNOSIS — G4733 Obstructive sleep apnea (adult) (pediatric): Secondary | ICD-10-CM | POA: Diagnosis not present

## 2016-12-09 DIAGNOSIS — G4730 Sleep apnea, unspecified: Secondary | ICD-10-CM | POA: Diagnosis not present

## 2016-12-09 DIAGNOSIS — C670 Malignant neoplasm of trigone of bladder: Secondary | ICD-10-CM

## 2016-12-09 DIAGNOSIS — H409 Unspecified glaucoma: Secondary | ICD-10-CM | POA: Insufficient documentation

## 2016-12-09 DIAGNOSIS — E291 Testicular hypofunction: Secondary | ICD-10-CM | POA: Insufficient documentation

## 2016-12-09 DIAGNOSIS — F419 Anxiety disorder, unspecified: Secondary | ICD-10-CM | POA: Diagnosis not present

## 2016-12-09 DIAGNOSIS — I10 Essential (primary) hypertension: Secondary | ICD-10-CM | POA: Insufficient documentation

## 2016-12-09 DIAGNOSIS — Z6834 Body mass index (BMI) 34.0-34.9, adult: Secondary | ICD-10-CM | POA: Diagnosis not present

## 2016-12-09 DIAGNOSIS — N359 Urethral stricture, unspecified: Secondary | ICD-10-CM | POA: Diagnosis not present

## 2016-12-09 DIAGNOSIS — K219 Gastro-esophageal reflux disease without esophagitis: Secondary | ICD-10-CM | POA: Insufficient documentation

## 2016-12-09 DIAGNOSIS — N400 Enlarged prostate without lower urinary tract symptoms: Secondary | ICD-10-CM | POA: Diagnosis not present

## 2016-12-09 DIAGNOSIS — E669 Obesity, unspecified: Secondary | ICD-10-CM | POA: Diagnosis not present

## 2016-12-09 DIAGNOSIS — I2510 Atherosclerotic heart disease of native coronary artery without angina pectoris: Secondary | ICD-10-CM | POA: Diagnosis not present

## 2016-12-09 DIAGNOSIS — K449 Diaphragmatic hernia without obstruction or gangrene: Secondary | ICD-10-CM | POA: Diagnosis not present

## 2016-12-09 DIAGNOSIS — N3289 Other specified disorders of bladder: Secondary | ICD-10-CM | POA: Diagnosis not present

## 2016-12-09 DIAGNOSIS — D494 Neoplasm of unspecified behavior of bladder: Secondary | ICD-10-CM | POA: Diagnosis not present

## 2016-12-09 DIAGNOSIS — Z9989 Dependence on other enabling machines and devices: Secondary | ICD-10-CM | POA: Diagnosis not present

## 2016-12-09 DIAGNOSIS — Z08 Encounter for follow-up examination after completed treatment for malignant neoplasm: Secondary | ICD-10-CM | POA: Diagnosis not present

## 2016-12-09 DIAGNOSIS — E785 Hyperlipidemia, unspecified: Secondary | ICD-10-CM | POA: Diagnosis not present

## 2016-12-09 HISTORY — DX: Diaphragmatic hernia without obstruction or gangrene: K44.9

## 2016-12-09 HISTORY — DX: Atherosclerotic heart disease of native coronary artery without angina pectoris: I25.10

## 2016-12-09 HISTORY — DX: Carpal tunnel syndrome, left upper limb: G56.02

## 2016-12-09 HISTORY — DX: Testicular hypofunction: E29.1

## 2016-12-09 HISTORY — DX: Other cardiomyopathies: I42.8

## 2016-12-09 HISTORY — DX: Dependence on other enabling machines and devices: Z99.89

## 2016-12-09 HISTORY — DX: Diverticulosis of large intestine without perforation or abscess without bleeding: K57.30

## 2016-12-09 HISTORY — PX: TRANSURETHRAL RESECTION OF BLADDER TUMOR WITH MITOMYCIN-C: SHX6459

## 2016-12-09 HISTORY — DX: Obstructive sleep apnea (adult) (pediatric): G47.33

## 2016-12-09 HISTORY — DX: Personal history of other mental and behavioral disorders: Z86.59

## 2016-12-09 HISTORY — DX: Personal history of malignant neoplasm of bladder: Z85.51

## 2016-12-09 HISTORY — DX: Neoplasm of unspecified behavior of bladder: D49.4

## 2016-12-09 HISTORY — DX: Personal history of other diseases of urinary system: Z87.448

## 2016-12-09 HISTORY — DX: Personal history of adenomatous and serrated colon polyps: Z86.0101

## 2016-12-09 HISTORY — DX: Personal history of colonic polyps: Z86.010

## 2016-12-09 HISTORY — DX: Other seasonal allergic rhinitis: J30.2

## 2016-12-09 LAB — POCT I-STAT 4, (NA,K, GLUC, HGB,HCT)
GLUCOSE: 108 mg/dL — AB (ref 65–99)
HCT: 49 % (ref 39.0–52.0)
Hemoglobin: 16.7 g/dL (ref 13.0–17.0)
POTASSIUM: 4.2 mmol/L (ref 3.5–5.1)
SODIUM: 139 mmol/L (ref 135–145)

## 2016-12-09 SURGERY — TRANSURETHRAL RESECTION OF BLADDER TUMOR WITH MITOMYCIN-C
Anesthesia: General | Site: Bladder

## 2016-12-09 MED ORDER — MEPERIDINE HCL 25 MG/ML IJ SOLN
6.2500 mg | INTRAMUSCULAR | Status: DC | PRN
  Filled 2016-12-09: qty 1

## 2016-12-09 MED ORDER — WHITE PETROLATUM GEL
Status: AC
  Filled 2016-12-09: qty 5

## 2016-12-09 MED ORDER — LIDOCAINE 2% (20 MG/ML) 5 ML SYRINGE
INTRAMUSCULAR | Status: DC | PRN
  Administered 2016-12-09: 100 mg via INTRAVENOUS

## 2016-12-09 MED ORDER — KETOROLAC TROMETHAMINE 30 MG/ML IJ SOLN
INTRAMUSCULAR | Status: AC
  Filled 2016-12-09: qty 1

## 2016-12-09 MED ORDER — LIDOCAINE 2% (20 MG/ML) 5 ML SYRINGE
INTRAMUSCULAR | Status: AC
  Filled 2016-12-09: qty 5

## 2016-12-09 MED ORDER — MIDAZOLAM HCL 5 MG/5ML IJ SOLN
INTRAMUSCULAR | Status: DC | PRN
  Administered 2016-12-09: 2 mg via INTRAVENOUS

## 2016-12-09 MED ORDER — FENTANYL CITRATE (PF) 100 MCG/2ML IJ SOLN
25.0000 ug | INTRAMUSCULAR | Status: DC | PRN
  Filled 2016-12-09: qty 1

## 2016-12-09 MED ORDER — KETOROLAC TROMETHAMINE 30 MG/ML IJ SOLN
INTRAMUSCULAR | Status: DC | PRN
  Administered 2016-12-09: 30 mg via INTRAVENOUS

## 2016-12-09 MED ORDER — PROPOFOL 10 MG/ML IV BOLUS
INTRAVENOUS | Status: DC | PRN
  Administered 2016-12-09: 50 mg via INTRAVENOUS
  Administered 2016-12-09: 250 mg via INTRAVENOUS

## 2016-12-09 MED ORDER — EPHEDRINE 5 MG/ML INJ
INTRAVENOUS | Status: AC
  Filled 2016-12-09: qty 10

## 2016-12-09 MED ORDER — MIDAZOLAM HCL 2 MG/2ML IJ SOLN
INTRAMUSCULAR | Status: AC
  Filled 2016-12-09: qty 2

## 2016-12-09 MED ORDER — FENTANYL CITRATE (PF) 100 MCG/2ML IJ SOLN
INTRAMUSCULAR | Status: AC
  Filled 2016-12-09: qty 2

## 2016-12-09 MED ORDER — PHENAZOPYRIDINE HCL 200 MG PO TABS: 200.0000 mg | ORAL_TABLET | Freq: Three times a day (TID) | ORAL | 0 refills | Status: DC | PRN

## 2016-12-09 MED ORDER — DEXAMETHASONE SODIUM PHOSPHATE 4 MG/ML IJ SOLN
INTRAMUSCULAR | Status: DC | PRN
  Administered 2016-12-09: 10 mg via INTRAVENOUS

## 2016-12-09 MED ORDER — LACTATED RINGERS IV SOLN
INTRAVENOUS | Status: DC
  Administered 2016-12-09 (×3): via INTRAVENOUS
  Filled 2016-12-09: qty 1000

## 2016-12-09 MED ORDER — DEXAMETHASONE SODIUM PHOSPHATE 10 MG/ML IJ SOLN
INTRAMUSCULAR | Status: AC
  Filled 2016-12-09: qty 1

## 2016-12-09 MED ORDER — PROMETHAZINE HCL 25 MG/ML IJ SOLN
6.2500 mg | INTRAMUSCULAR | Status: DC | PRN
  Filled 2016-12-09: qty 1

## 2016-12-09 MED ORDER — EPHEDRINE SULFATE-NACL 50-0.9 MG/10ML-% IV SOSY
PREFILLED_SYRINGE | INTRAVENOUS | Status: DC | PRN
  Administered 2016-12-09 (×2): 10 mg via INTRAVENOUS

## 2016-12-09 MED ORDER — FENTANYL CITRATE (PF) 100 MCG/2ML IJ SOLN
INTRAMUSCULAR | Status: DC | PRN
  Administered 2016-12-09 (×2): 25 ug via INTRAVENOUS
  Administered 2016-12-09: 50 ug via INTRAVENOUS

## 2016-12-09 MED ORDER — MITOMYCIN CHEMO FOR BLADDER INSTILLATION 40 MG
40.0000 mg | Freq: Once | INTRAVENOUS | Status: AC
  Administered 2016-12-09: 40 mg via INTRAVESICAL
  Filled 2016-12-09: qty 40

## 2016-12-09 MED ORDER — ONDANSETRON HCL 4 MG/2ML IJ SOLN
INTRAMUSCULAR | Status: AC
  Filled 2016-12-09: qty 2

## 2016-12-09 MED ORDER — CEFAZOLIN SODIUM-DEXTROSE 2-4 GM/100ML-% IV SOLN
INTRAVENOUS | Status: AC
  Filled 2016-12-09: qty 100

## 2016-12-09 MED ORDER — TRAMADOL-ACETAMINOPHEN 37.5-325 MG PO TABS: 1.0000 | ORAL_TABLET | Freq: Four times a day (QID) | ORAL | 0 refills | Status: DC | PRN

## 2016-12-09 MED ORDER — HYDROCODONE-ACETAMINOPHEN 7.5-325 MG PO TABS
1.0000 | ORAL_TABLET | Freq: Once | ORAL | Status: DC | PRN
  Filled 2016-12-09: qty 1

## 2016-12-09 MED ORDER — ONDANSETRON HCL 4 MG/2ML IJ SOLN
INTRAMUSCULAR | Status: DC | PRN
  Administered 2016-12-09: 4 mg via INTRAVENOUS

## 2016-12-09 MED ORDER — PHENAZOPYRIDINE HCL 200 MG PO TABS
200.0000 mg | ORAL_TABLET | Freq: Once | ORAL | Status: AC
  Administered 2016-12-09: 200 mg via ORAL
  Filled 2016-12-09: qty 1

## 2016-12-09 MED ORDER — CEFAZOLIN SODIUM-DEXTROSE 2-4 GM/100ML-% IV SOLN
2.0000 g | INTRAVENOUS | Status: AC
  Administered 2016-12-09: 2 g via INTRAVENOUS
  Filled 2016-12-09: qty 100

## 2016-12-09 MED ORDER — PHENAZOPYRIDINE HCL 100 MG PO TABS
ORAL_TABLET | ORAL | Status: AC
  Filled 2016-12-09: qty 2

## 2016-12-09 MED ORDER — PROPOFOL 10 MG/ML IV BOLUS
INTRAVENOUS | Status: AC
  Filled 2016-12-09: qty 40

## 2016-12-09 MED FILL — TRAMADOL-ACETAMINOPHN 37.5-: 37.5-325 | 7 days supply | Qty: 30 | Fill #0

## 2016-12-09 SURGICAL SUPPLY — 29 items
BAG DRAIN URO-CYSTO SKYTR STRL (DRAIN) ×3 IMPLANT
BAG DRN ANRFLXCHMBR STRAP LEK (BAG)
BAG DRN UROCATH (DRAIN) ×1
BAG URINE DRAINAGE (UROLOGICAL SUPPLIES) ×2 IMPLANT
BAG URINE LEG 19OZ MD ST LTX (BAG) IMPLANT
BOOTIES KNEE HIGH SLOAN (MISCELLANEOUS) ×3 IMPLANT
CATH FOLEY 2WAY SLVR  5CC 16FR (CATHETERS) ×2
CATH FOLEY 2WAY SLVR  5CC 20FR (CATHETERS)
CATH FOLEY 2WAY SLVR  5CC 22FR (CATHETERS)
CATH FOLEY 2WAY SLVR 5CC 16FR (CATHETERS) IMPLANT
CATH FOLEY 2WAY SLVR 5CC 20FR (CATHETERS) IMPLANT
CATH FOLEY 2WAY SLVR 5CC 22FR (CATHETERS) IMPLANT
CLOTH BEACON ORANGE TIMEOUT ST (SAFETY) ×3 IMPLANT
ELECT REM PT RETURN 9FT ADLT (ELECTROSURGICAL) ×3
ELECTRODE REM PT RTRN 9FT ADLT (ELECTROSURGICAL) ×1 IMPLANT
GLOVE BIO SURGEON STRL SZ7.5 (GLOVE) ×3 IMPLANT
GOWN STRL REUS W/ TWL XL LVL3 (GOWN DISPOSABLE) ×1 IMPLANT
GOWN STRL REUS W/TWL XL LVL3 (GOWN DISPOSABLE) ×3
GOWN XL W/COTTON TOWEL STD (GOWNS) ×3 IMPLANT
HOLDER FOLEY CATH W/STRAP (MISCELLANEOUS) IMPLANT
KIT ROOM TURNOVER WOR (KITS) ×3 IMPLANT
LOOP CUT BIPOLAR 24F LRG (ELECTROSURGICAL) ×4 IMPLANT
MANIFOLD NEPTUNE II (INSTRUMENTS) IMPLANT
PACK CYSTO (CUSTOM PROCEDURE TRAY) ×3 IMPLANT
PLUG CATH AND CAP STER (CATHETERS) ×2 IMPLANT
SET ASPIRATION TUBING (TUBING) IMPLANT
SYRINGE IRR TOOMEY STRL 70CC (SYRINGE) ×2 IMPLANT
TUBE CONNECTING 12'X1/4 (SUCTIONS)
TUBE CONNECTING 12X1/4 (SUCTIONS) IMPLANT

## 2016-12-09 NOTE — Op Note (Signed)
Pre-operative diagnosis :   Bladder cancer  Postoperative diagnosis: same  Operation: Instillation of Mitomycin -C in Recovery Room  Surgeon:  S. Gaynelle Arabian, MD  First assistant: None  Anesthesia:  none  Preparation: After  awakening from LMA anesthesia, I explained to both the patient and his wife the operative findings of necrotic TCC within the bladder,and that I will treat him as if he has had live TUR-BT, with instillation of Mitomycin-C in his bladder ( as posted).  Review history: Hx of TUR-BT, post BCG therapy  Statement of  Likelihood of Success: Excellent. TIME-OUT observed.:  Procedure:  The penis was prepped with Betadine solution and draped in usual fashion. With sterile glove and gown, the arm band was double checked, and mitomycin-C instilled within the bladder. The catheter plug was replaced within the catheter. The mitomycin-C is left for 40 minutes, prior to draining the Foley catheter, and irrigating the bladder with sterile water. Catheter is then drained, and removed, and the patient is allowed to be discharged.

## 2016-12-09 NOTE — Interval H&P Note (Signed)
History and Physical Interval Note:  12/09/2016 9:08 AM  Early [name]  has presented today for surgery, with the diagnosis of BLADDER TUMOR  The various methods of treatment have been discussed with the patient and family. After consideration of risks, benefits and other options for treatment, the patient has consented to  Procedure(s): CYSTOSCOPY TRANSURETHRAL RESECTION OF BLADDER TUMOR WITH MITOMYCIN-C (N/A) as a surgical intervention .  The patient's history has been reviewed, patient examined, no change in status, stable for surgery.  I have reviewed the patient's chart and labs.  Questions were answered to the patient's satisfaction.     Brian Cortez I Vibhav Waddill

## 2016-12-09 NOTE — Anesthesia Postprocedure Evaluation (Signed)
Anesthesia Post Note  Patient: Brian Cortez  Procedure(s) Performed: Procedure(s) (LRB): CYSTOSCOPY TRANSURETHRAL RESECTION OF BLADDER TUMOR WITH MITOMYCIN-C (N/A)  Patient location during evaluation: PACU Anesthesia Type: General Level of consciousness: awake and alert Pain management: pain level controlled Vital Signs Assessment: post-procedure vital signs reviewed and stable Respiratory status: spontaneous breathing, nonlabored ventilation, respiratory function stable and patient connected to nasal cannula oxygen Cardiovascular status: blood pressure returned to baseline and stable Postop Assessment: no signs of nausea or vomiting Anesthetic complications: no       Last Vitals:  Vitals:   12/09/16 1115 12/09/16 1130  BP: 132/80 125/81  Pulse: 70 65  Resp: 18 11  Temp:      Last Pain:  Vitals:   12/09/16 1130  TempSrc:   PainSc: 0-No pain                 Danaisha Celli S

## 2016-12-09 NOTE — Op Note (Signed)
Pre-operative diagnosis :   Bladder Cancer  Postoperative diagnosis: Same  Operation: Cystoscopy, TUR-BT 3 cm tumor. Mitomycin-C placement in bladder in Recovery Room.   Findings:  3 cm necrotic bladder tumor  Surgeon:  S. Gaynelle Arabian, MD  First assistant:  none  Anesthesia: General LMA  Preparation: After appropriate pre-anesthesia, the patient was brought to the operative room, placed on the operating table in the dorsal supine position where general LMA anesthesia was introduced. He was then replaced in the dorsal lithotomy position with pubis prepped with Betadine solution and draped in usual fashion. The history was reviewed. The armband was double checked.  Review history:  Brian Cortez is a 66 year-old male established patient who is here for follow-up of bladder cancer treatment.  His bladder cancer was superficial and limitied to the bladder lining. His bladder cancer was not muscle invasive. He had treatment with the following intravesical agents: bcg. Patient denies mitomycin, interferon, and adriamycin.   He did have a TURBT. His last bladder tumor was resected 11/06/2013. He has had the following number of bladder resections: 1. He did not have a radical cystectomy for the treatment of his bladder cancer. He did not have chemotherapy for treamtent of his bladder cancer. He did not have radiation to treat his bladder cancer. He is not here for an intravesical treatment today.   He has not had blood in his urine recently.   His last cysto was 11/10/2015. His last radiologic test to evaluate the kidneys was 11/26/2016.   Statement of  Likelihood of Success: Excellent. TIME-OUT observed.:  Procedure: Cystourethroscopy was accomplished, after dilation to 19 [place] of a sub-meatal urethral stricture. The continuous flow resectoscope was then passed without difficulty. Within the bladder, trabeculation was identified, and a bladder diverticulum was identified as well. Following  this, inspection of the bladder revealed a 43 m necrotic trigonal tumor was identified, and this was resected. No obvious lives that her cancer was identified. It is suspected that the bladder tumor was well treated with BCG therapy previously.  Following resection of the tumor, I cauterized the tumor base. All tissue was irrigated free from the bladder. A size 16 Foley catheter was placed with 20 mL of sterile water within the balloon. The patient will receive mitomycin C in recovery room.  He was awakened after given IV Toradol, and taken to recovery room in good condition.

## 2016-12-09 NOTE — Transfer of Care (Signed)
Immediate Anesthesia Transfer of Care Note  Patient: Brian Cortez  Procedure(s) Performed: Procedure(s): CYSTOSCOPY TRANSURETHRAL RESECTION OF BLADDER TUMOR WITH MITOMYCIN-C (N/A)  Patient Location: PACU  Anesthesia Type:General  Level of Consciousness: awake, alert , oriented and patient cooperative  Airway & Oxygen Therapy: Patient Spontanous Breathing and Patient connected to nasal cannula oxygen  Post-op Assessment: Report given to RN and Post -op Vital signs reviewed and stable  Post vital signs: Reviewed and stable  Last Vitals:  Vitals:   12/09/16 0710  BP: (!) 148/83  Pulse: 63  Resp: 18  Temp: 36.9 C    Last Pain:  Vitals:   12/09/16 0710  TempSrc: Oral      Patients Stated Pain Goal: 4 ([id number])  Complications: No apparent anesthesia complications

## 2016-12-09 NOTE — Discharge Instructions (Signed)
CYSTOSCOPY HOME CARE INSTRUCTIONS  Activity: Rest for the remainder of the day.  Do not drive or operate equipment today.  You may resume normal activities in one to two days as instructed by your physician.   Meals: Drink plenty of liquids and eat light foods such as gelatin or soup this evening.  You may return to a normal meal plan tomorrow.  Return to Work: You may return to work in one to two days or as instructed by your physician.  Special Instructions / Symptoms: Call your physician if any of these symptoms occur:   -persistent or heavy bleeding  -bleeding which continues after first few urination  -large blood clots that are difficult to pass  -urine stream diminishes or stops completely  -fever equal to or higher than 101 degrees Farenheit.  -cloudy urine with a strong, foul odor  -severe pain  Females should always wipe from front to back after elimination.  You may feel some burning pain when you urinate.  This should disappear with time.  Applying moist heat to the lower abdomen or a hot tub bath may help relieve the pain. \  Follow-Up / Date of Return Visit to Your Physician:  Call for an appointment to arrange follow-up.  Patient Signature:  ________________________________________________________  Nurse's Signature:  ________________________________________________________  Post Anesthesia Home Care Instructions  Activity: Get plenty of rest for the remainder of the day. A responsible adult should stay with you for 24 hours following the procedure.  For the next 24 hours, DO NOT: -Drive a car -Paediatric nurse -Drink alcoholic beverages -Take any medication unless instructed by your physician -Make any legal decisions or sign important papers. Use CPAP if takes a nap. Meals: Start with liquid foods such as gelatin or soup. Progress to regular foods as tolerated. Avoid greasy, spicy, heavy foods. If nausea and/or vomiting occur, drink only clear liquids  until the nausea and/or vomiting subsides. Call your physician if vomiting continues.  Special Instructions/Symptoms: Your throat may feel dry or sore from the anesthesia or the breathing tube placed in your throat during surgery. If this causes discomfort, gargle with warm salt water. The discomfort should disappear within 24 hours.  If you had a scopolamine patch placed behind your ear for the management of post- operative nausea and/or vomiting:  1. The medication in the patch is effective for 72 hours, after which it should be removed.  Wrap patch in a tissue and discard in the trash. Wash hands thoroughly with soap and water. 2. You may remove the patch earlier than 72 hours if you experience unpleasant side effects which may include dry mouth, dizziness or visual disturbances. 3. Avoid touching the patch. Wash your hands with soap and water after contact with the patch.   Bladder Cancer Bladder cancer is an abnormal growth of tissue in your bladder. Your bladder is the balloon-like sac in your pelvis. It collects and stores urine that comes from the kidneys through the ureters. The bladder wall is made of layers. If cancer spreads into these layers and through the wall of the bladder, it becomes more difficult to treat.  There are four stages of bladder cancer:  Stage I. Cancer at this stage occurs in the bladder's inner lining but has not invaded the muscular bladder wall.  Stage II. At this stage, cancer has invaded the bladder wall but is still confined to the bladder.  Stage III. By this stage, the cancer cells have spread through the bladder wall to  surrounding tissue. They may also have spread to the prostate in men or the uterus or vagina in women.  Stage IV. By this stage, cancer cells may have spread to the lymph nodes and other organs, such as your lungs, bones, or liver. RISK FACTORS Although the cause of bladder cancer is not known, the following risk factors can increase  your chances of getting bladder cancer:   Smoking.   Occupational exposures, such as rubber, leather, textile, dyes, chemicals, and paint.  Being white.  Age.   Being male.   Having chronic bladder inflammation.   Having a bladder cancer history.   Having a family history of bladder cancer (heredity).   Having had chemotherapy or radiation therapy to the pelvis.   Being exposed to arsenic.  SYMPTOMS   Blood in the urine.   Pain with urination.   Frequent bladder or urine infections.  Increase in urgency and frequency of urination. DIAGNOSIS  Your health care provider may suspect bladder cancer based on your description of urinary symptoms or based on the finding of blood or infection in the urine (especially if this has recurred several times). Other tests or procedures that may be performed include:   A narrow tube being inserted into your bladder through your urethra (cystoscopy) in order to view the lining of your bladder for tumors.   A biopsy to sample the tumor to see if cancer is present.  If cancer is present, it will then be staged to determine its severity and extent. It is important to know how deeply into the bladder wall the cancer has grown and whether the cancer has spread to any other parts of your body. Staging may require blood tests or special scans such as a CT scan, MRI, bone scan, or chest X-ray.  TREATMENT  Once your cancer has been diagnosed and staged, you should discuss a treatment plan with your health care provider. Based on the stage of the cancer, one treatment or a combination of treatments may be recommended. The most common forms of treatment are:   Surgery. Procedures that may be done include transurethral resection and cystectomy.  Radiation therapy. This is infrequently used to treat bladder cancer.   Chemotherapy. During this treatment, drugs are used to kill cancer cells.  Immunotherapy. This is usually administered  directly into the bladder. HOME CARE INSTRUCTIONS  Take medicines only as directed by your health care provider.   Maintain a healthy diet.   Consider joining a support group. This may help you learn to cope with the stress of having bladder cancer.   Seek advice to help you manage treatment side effects.   Keep all follow-up visits as directed by your health care provider.   Inform your cancer specialist if you are admitted to the hospital.  [place] IF:  There is blood in your urine.  You have symptoms of a urinary tract infection. These include:  Tiredness.  Shakiness.  Weakness.  Muscle aches.  Abdominal pain.  Frequent and intense urge to urinate (in young women).  Burning feeling in the bladder or urethra during urination (in young women). SEEK IMMEDIATE MEDICAL CARE IF:  You are unable to urinate. This information is not intended to replace advice given to you by your health care provider. Make sure you discuss any questions you have with your health care provider. Document Released: 11/11/2003 Document Revised: 11/29/2014 Document Reviewed: 05/01/2013 Elsevier Interactive Patient Education  2017 [employer].

## 2016-12-09 NOTE — Anesthesia Procedure Notes (Signed)
Procedure Name: LMA Insertion Date/Time: 12/09/2016 9:19 AM Performed by: Wanita Chamberlain Pre-anesthesia Checklist: Patient identified, Timeout performed, Emergency Drugs available, Suction available and Patient being monitored Patient Re-evaluated:Patient Re-evaluated prior to inductionOxygen Delivery Method: Circle system utilized Preoxygenation: Pre-oxygenation with 100% oxygen Intubation Type: IV induction Ventilation: Mask ventilation without difficulty LMA: LMA inserted LMA Size: 4.0 Number of attempts: 1 Placement Confirmation: positive ETCO2 and breath sounds checked- equal and bilateral Tube secured with: Tape Dental Injury: Teeth and Oropharynx as per pre-operative assessment

## 2016-12-09 NOTE — Anesthesia Preprocedure Evaluation (Addendum)
Anesthesia Evaluation  Patient identified by MRN, date of birth, ID band Patient awake    Reviewed: Allergy & Precautions, NPO status , Patient's Chart, lab work & pertinent test results, reviewed documented beta blocker date and time   Airway Mallampati: III       Dental no notable dental hx. (+) Teeth Intact   Pulmonary sleep apnea and Continuous Positive Airway Pressure Ventilation , pneumonia, resolved,    Pulmonary exam normal breath sounds clear to auscultation       Cardiovascular hypertension, Pt. on medications and Pt. on home beta blockers + CAD  Normal cardiovascular exam Rhythm:Regular Rate:Normal  LVEF 50-55% on last exam Non ischemic CM   Neuro/Psych Anxiety  Neuromuscular disease    GI/Hepatic Neg liver ROS, hiatal hernia, GERD  Medicated and Controlled,Diverticulosis Hx/o Colon polyps   Endo/Other  Obesity Hyperlipidemia  Renal/GU negative Renal ROS   Bladder Ca    Musculoskeletal  (+) Arthritis , Osteoarthritis,  DDD with Cervical radiculopathy   Abdominal (+) + obese,   Peds  Hematology   Anesthesia Other Findings   Reproductive/Obstetrics                               Chemistry      Component Value Date/Time   NA 136 12/24/2014 0805   K 4.6 12/24/2014 0805   CL 102 12/24/2014 0805   CO2 30 12/24/2014 0805   BUN 12 12/24/2014 0805   CREATININE 1.08 12/24/2014 0805      Component Value Date/Time   CALCIUM 9.2 12/24/2014 0805   ALKPHOS 43 11/12/2014 0750   AST 29 11/12/2014 0750   ALT 23 11/12/2014 0750   BILITOT 0.8 11/12/2014 0750     Lab Results  Component Value Date   WBC 8.2 11/06/2013   HGB 16.7 11/06/2013   HCT 49.6 11/06/2013   MCV 91.7 11/06/2013   PLT 189 11/06/2013   EKG: normal EKG, normal sinus rhythm,Occasional PVC, minimal voltage criteria for LVH, non specific St T wave changes.  Anesthesia Physical Anesthesia Plan  ASA:  III  Anesthesia Plan: General   Post-op Pain Management:    Induction: Intravenous  Airway Management Planned: LMA  Additional Equipment:   Intra-op Plan:   Post-operative Plan: Extubation in OR  Informed Consent: I have reviewed the patients History and Physical, chart, labs and discussed the procedure including the risks, benefits and alternatives for the proposed anesthesia with the patient or authorized representative who has indicated his/her understanding and acceptance.   Dental advisory given  Plan Discussed with: CRNA, Anesthesiologist and Surgeon  Anesthesia Plan Comments:         Anesthesia Quick Evaluation

## 2016-12-10 ENCOUNTER — Encounter (HOSPITAL_BASED_OUTPATIENT_CLINIC_OR_DEPARTMENT_OTHER): Payer: Self-pay | Admitting: Urology

## 2016-12-10 DIAGNOSIS — E291 Testicular hypofunction: Secondary | ICD-10-CM | POA: Diagnosis not present

## 2016-12-16 DIAGNOSIS — I5189 Other ill-defined heart diseases: Secondary | ICD-10-CM | POA: Diagnosis not present

## 2016-12-16 DIAGNOSIS — Z0000 Encounter for general adult medical examination without abnormal findings: Secondary | ICD-10-CM | POA: Diagnosis not present

## 2016-12-16 DIAGNOSIS — E291 Testicular hypofunction: Secondary | ICD-10-CM | POA: Diagnosis not present

## 2016-12-16 DIAGNOSIS — I10 Essential (primary) hypertension: Secondary | ICD-10-CM | POA: Diagnosis not present

## 2016-12-16 DIAGNOSIS — B0229 Other postherpetic nervous system involvement: Secondary | ICD-10-CM | POA: Diagnosis not present

## 2016-12-16 DIAGNOSIS — K219 Gastro-esophageal reflux disease without esophagitis: Secondary | ICD-10-CM | POA: Diagnosis not present

## 2016-12-16 DIAGNOSIS — K635 Polyp of colon: Secondary | ICD-10-CM | POA: Diagnosis not present

## 2016-12-16 DIAGNOSIS — G4730 Sleep apnea, unspecified: Secondary | ICD-10-CM | POA: Diagnosis not present

## 2016-12-16 DIAGNOSIS — Z1389 Encounter for screening for other disorder: Secondary | ICD-10-CM | POA: Diagnosis not present

## 2016-12-16 DIAGNOSIS — E784 Other hyperlipidemia: Secondary | ICD-10-CM | POA: Diagnosis not present

## 2016-12-16 DIAGNOSIS — C679 Malignant neoplasm of bladder, unspecified: Secondary | ICD-10-CM | POA: Diagnosis not present

## 2016-12-16 DIAGNOSIS — R7301 Impaired fasting glucose: Secondary | ICD-10-CM | POA: Diagnosis not present

## 2016-12-17 DIAGNOSIS — N3941 Urge incontinence: Secondary | ICD-10-CM | POA: Diagnosis not present

## 2016-12-17 DIAGNOSIS — C672 Malignant neoplasm of lateral wall of bladder: Secondary | ICD-10-CM | POA: Diagnosis not present

## 2016-12-21 ENCOUNTER — Other Ambulatory Visit: Payer: Self-pay | Admitting: Urology

## 2016-12-21 DIAGNOSIS — D751 Secondary polycythemia: Secondary | ICD-10-CM

## 2016-12-24 DIAGNOSIS — E291 Testicular hypofunction: Secondary | ICD-10-CM | POA: Diagnosis not present

## 2017-01-05 ENCOUNTER — Encounter: Payer: Self-pay | Admitting: Cardiology

## 2017-01-05 ENCOUNTER — Encounter: Payer: Self-pay | Admitting: Neurology

## 2017-01-05 NOTE — Telephone Encounter (Signed)
Brian Cortez, Does he want to stay with [facility], I will be happy to change him.

## 2017-01-06 DIAGNOSIS — J0100 Acute maxillary sinusitis, unspecified: Secondary | ICD-10-CM | POA: Diagnosis not present

## 2017-01-06 DIAGNOSIS — G4730 Sleep apnea, unspecified: Secondary | ICD-10-CM | POA: Diagnosis not present

## 2017-01-06 DIAGNOSIS — R05 Cough: Secondary | ICD-10-CM | POA: Diagnosis not present

## 2017-01-06 DIAGNOSIS — J309 Allergic rhinitis, unspecified: Secondary | ICD-10-CM | POA: Diagnosis not present

## 2017-01-06 DIAGNOSIS — Z6834 Body mass index (BMI) 34.0-34.9, adult: Secondary | ICD-10-CM | POA: Diagnosis not present

## 2017-01-07 DIAGNOSIS — E291 Testicular hypofunction: Secondary | ICD-10-CM | POA: Diagnosis not present

## 2017-01-11 ENCOUNTER — Encounter: Payer: Self-pay | Admitting: Neurology

## 2017-01-13 NOTE — Telephone Encounter (Signed)
Please let Brian Cortez know that we can change him to DME  [facility], they have in office in [place] are in [place] phone #[phone] located on [address]  CD

## 2017-01-14 DIAGNOSIS — Z6834 Body mass index (BMI) 34.0-34.9, adult: Secondary | ICD-10-CM | POA: Diagnosis not present

## 2017-01-14 DIAGNOSIS — R635 Abnormal weight gain: Secondary | ICD-10-CM | POA: Diagnosis not present

## 2017-01-14 DIAGNOSIS — Z1212 Encounter for screening for malignant neoplasm of rectum: Secondary | ICD-10-CM | POA: Diagnosis not present

## 2017-01-14 DIAGNOSIS — I10 Essential (primary) hypertension: Secondary | ICD-10-CM | POA: Diagnosis not present

## 2017-01-14 DIAGNOSIS — E782 Mixed hyperlipidemia: Secondary | ICD-10-CM | POA: Diagnosis not present

## 2017-01-14 DIAGNOSIS — E538 Deficiency of other specified B group vitamins: Secondary | ICD-10-CM | POA: Diagnosis not present

## 2017-01-17 ENCOUNTER — Ambulatory Visit (HOSPITAL_COMMUNITY)
Admission: RE | Admit: 2017-01-17 | Discharge: 2017-01-17 | Disposition: A | Discharge status: Home / Self Care | Payer: PPO | Source: Ambulatory Visit | Attending: Urology | Admitting: Urology

## 2017-01-17 ENCOUNTER — Encounter (HOSPITAL_COMMUNITY): Payer: Self-pay

## 2017-01-17 DIAGNOSIS — D751 Secondary polycythemia: Secondary | ICD-10-CM | POA: Diagnosis not present

## 2017-01-17 NOTE — Progress Notes (Signed)
Early [name] presents today for phlebotomy per MD orders. Phlebotomy procedure started at 1320  and ended at 1345.500 cc removed. Patient tolerated procedure well. IV converted to NSL and Pt remains for observation post phlebotomy.

## 2017-01-17 NOTE — Discharge Instructions (Signed)
     Therapeutic Phlebotomy, Care After Refer to this sheet in the next few weeks. These instructions provide you with information about caring for yourself after your procedure. Your health care provider may also give you more specific instructions. Your treatment has been planned according to current medical practices, but problems sometimes occur. Call your health care provider if you have any problems or questions after your procedure. What can I expect after the procedure? After the procedure, it is common to have:  Light-headedness or dizziness. You may feel faint.  Nausea.  Tiredness. Follow these instructions at home: Activity  Return to your normal activities as directed by your health care provider. Most people can go back to their normal activities right away.  Avoid strenuous physical activity and heavy lifting or pulling for about 5 hours after the procedure. Do not lift anything that is heavier than 10 lb (4.5 kg).  Athletes should avoid strenuous exercise for at least 12 hours.  Change positions slowly for the remainder of the day. This will help to prevent light-headedness or fainting.  If you feel light-headed, lie down until the feeling goes away. Eating and drinking  Be sure to eat well-balanced meals for the next 24 hours.  Drink enough fluid to keep your urine clear or pale yellow.  Avoid drinking alcohol on the day that you had the procedure. Care of the Needle Insertion Site  Keep your bandage dry. You can remove the bandage after about 5 hours or as directed by your health care provider.  If you have bleeding from the needle insertion site, elevate your arm and press firmly on the site until the bleeding stops.  If you have bruising at the site, apply ice to the area:  Put ice in a plastic bag.  Place a towel between your skin and the bag.  Leave the ice on for 20 minutes, 2-3 times a day for the first 24 hours.  If the swelling does not go away  after 24 hours, apply a warm, moist washcloth to the area for 20 minutes, 2-3 times a day. General instructions  Avoid smoking for at least 30 minutes after the procedure.  Keep all follow-up visits as directed by your health care provider. It is important to continue with further therapeutic phlebotomy treatments as directed. Contact a health care provider if:  You have redness, swelling, or pain at the needle insertion site.  You have fluid, blood, or pus coming from the needle insertion site.  You feel light-headed, dizzy, or nauseated, and the feeling does not go away.  You notice new bruising at the needle insertion site.  You feel weaker than normal.  You have a fever or chills. Get help right away if:  You have severe nausea or vomiting.  You have chest pain.  You have trouble breathing. This information is not intended to replace advice given to you by your health care provider. Make sure you discuss any questions you have with your health care provider. Document Released: 04/12/2011 Document Revised: 07/10/2016 Document Reviewed: 11/04/2014 Elsevier Interactive Patient Education  2017 [employer] Inc[employer]  

## 2017-01-18 DIAGNOSIS — G4733 Obstructive sleep apnea (adult) (pediatric): Secondary | ICD-10-CM | POA: Diagnosis not present

## 2017-01-20 DIAGNOSIS — E782 Mixed hyperlipidemia: Secondary | ICD-10-CM | POA: Diagnosis not present

## 2017-01-20 DIAGNOSIS — I10 Essential (primary) hypertension: Secondary | ICD-10-CM | POA: Diagnosis not present

## 2017-01-20 DIAGNOSIS — Z6833 Body mass index (BMI) 33.0-33.9, adult: Secondary | ICD-10-CM | POA: Diagnosis not present

## 2017-01-20 DIAGNOSIS — E538 Deficiency of other specified B group vitamins: Secondary | ICD-10-CM | POA: Diagnosis not present

## 2017-01-21 DIAGNOSIS — H353132 Nonexudative age-related macular degeneration, bilateral, intermediate dry stage: Secondary | ICD-10-CM | POA: Diagnosis not present

## 2017-01-21 DIAGNOSIS — H401133 Primary open-angle glaucoma, bilateral, severe stage: Secondary | ICD-10-CM | POA: Diagnosis not present

## 2017-01-21 DIAGNOSIS — Z961 Presence of intraocular lens: Secondary | ICD-10-CM | POA: Diagnosis not present

## 2017-01-21 DIAGNOSIS — H31003 Unspecified chorioretinal scars, bilateral: Secondary | ICD-10-CM | POA: Diagnosis not present

## 2017-01-25 DIAGNOSIS — Z6833 Body mass index (BMI) 33.0-33.9, adult: Secondary | ICD-10-CM | POA: Diagnosis not present

## 2017-01-25 DIAGNOSIS — M25561 Pain in right knee: Secondary | ICD-10-CM | POA: Insufficient documentation

## 2017-01-25 DIAGNOSIS — E291 Testicular hypofunction: Secondary | ICD-10-CM | POA: Diagnosis not present

## 2017-02-01 DIAGNOSIS — M25561 Pain in right knee: Secondary | ICD-10-CM | POA: Diagnosis not present

## 2017-02-01 DIAGNOSIS — I10 Essential (primary) hypertension: Secondary | ICD-10-CM | POA: Diagnosis not present

## 2017-02-01 DIAGNOSIS — E538 Deficiency of other specified B group vitamins: Secondary | ICD-10-CM | POA: Diagnosis not present

## 2017-02-01 DIAGNOSIS — E782 Mixed hyperlipidemia: Secondary | ICD-10-CM | POA: Diagnosis not present

## 2017-02-01 DIAGNOSIS — Z6833 Body mass index (BMI) 33.0-33.9, adult: Secondary | ICD-10-CM | POA: Diagnosis not present

## 2017-02-09 DIAGNOSIS — E291 Testicular hypofunction: Secondary | ICD-10-CM | POA: Diagnosis not present

## 2017-02-09 DIAGNOSIS — E782 Mixed hyperlipidemia: Secondary | ICD-10-CM | POA: Diagnosis not present

## 2017-02-09 DIAGNOSIS — I10 Essential (primary) hypertension: Secondary | ICD-10-CM | POA: Diagnosis not present

## 2017-02-09 DIAGNOSIS — Z6832 Body mass index (BMI) 32.0-32.9, adult: Secondary | ICD-10-CM | POA: Diagnosis not present

## 2017-02-09 DIAGNOSIS — E538 Deficiency of other specified B group vitamins: Secondary | ICD-10-CM | POA: Diagnosis not present

## 2017-02-16 DIAGNOSIS — E538 Deficiency of other specified B group vitamins: Secondary | ICD-10-CM | POA: Diagnosis not present

## 2017-02-16 DIAGNOSIS — Z6832 Body mass index (BMI) 32.0-32.9, adult: Secondary | ICD-10-CM | POA: Diagnosis not present

## 2017-02-16 DIAGNOSIS — E782 Mixed hyperlipidemia: Secondary | ICD-10-CM | POA: Diagnosis not present

## 2017-02-16 DIAGNOSIS — I10 Essential (primary) hypertension: Secondary | ICD-10-CM | POA: Diagnosis not present

## 2017-02-23 DIAGNOSIS — E291 Testicular hypofunction: Secondary | ICD-10-CM | POA: Diagnosis not present

## 2017-02-23 DIAGNOSIS — E782 Mixed hyperlipidemia: Secondary | ICD-10-CM | POA: Diagnosis not present

## 2017-02-23 DIAGNOSIS — I10 Essential (primary) hypertension: Secondary | ICD-10-CM | POA: Diagnosis not present

## 2017-02-23 DIAGNOSIS — Z6832 Body mass index (BMI) 32.0-32.9, adult: Secondary | ICD-10-CM | POA: Diagnosis not present

## 2017-02-23 DIAGNOSIS — E538 Deficiency of other specified B group vitamins: Secondary | ICD-10-CM | POA: Diagnosis not present

## 2017-03-14 DIAGNOSIS — C672 Malignant neoplasm of lateral wall of bladder: Secondary | ICD-10-CM | POA: Diagnosis not present

## 2017-03-16 DIAGNOSIS — E782 Mixed hyperlipidemia: Secondary | ICD-10-CM | POA: Diagnosis not present

## 2017-03-16 DIAGNOSIS — I10 Essential (primary) hypertension: Secondary | ICD-10-CM | POA: Diagnosis not present

## 2017-03-16 DIAGNOSIS — E538 Deficiency of other specified B group vitamins: Secondary | ICD-10-CM | POA: Diagnosis not present

## 2017-03-16 DIAGNOSIS — Z6832 Body mass index (BMI) 32.0-32.9, adult: Secondary | ICD-10-CM | POA: Diagnosis not present

## 2017-03-21 DIAGNOSIS — H43811 Vitreous degeneration, right eye: Secondary | ICD-10-CM | POA: Diagnosis not present

## 2017-03-21 DIAGNOSIS — H35433 Paving stone degeneration of retina, bilateral: Secondary | ICD-10-CM | POA: Diagnosis not present

## 2017-03-21 DIAGNOSIS — H353131 Nonexudative age-related macular degeneration, bilateral, early dry stage: Secondary | ICD-10-CM | POA: Diagnosis not present

## 2017-03-21 DIAGNOSIS — H3530 Unspecified macular degeneration: Secondary | ICD-10-CM | POA: Diagnosis not present

## 2017-03-23 DIAGNOSIS — E782 Mixed hyperlipidemia: Secondary | ICD-10-CM | POA: Diagnosis not present

## 2017-03-23 DIAGNOSIS — Z6832 Body mass index (BMI) 32.0-32.9, adult: Secondary | ICD-10-CM | POA: Diagnosis not present

## 2017-03-23 DIAGNOSIS — I10 Essential (primary) hypertension: Secondary | ICD-10-CM | POA: Diagnosis not present

## 2017-03-23 DIAGNOSIS — E538 Deficiency of other specified B group vitamins: Secondary | ICD-10-CM | POA: Diagnosis not present

## 2017-03-25 DIAGNOSIS — Z961 Presence of intraocular lens: Secondary | ICD-10-CM | POA: Diagnosis not present

## 2017-03-25 DIAGNOSIS — H401113 Primary open-angle glaucoma, right eye, severe stage: Secondary | ICD-10-CM | POA: Diagnosis not present

## 2017-03-25 DIAGNOSIS — H401122 Primary open-angle glaucoma, left eye, moderate stage: Secondary | ICD-10-CM | POA: Diagnosis not present

## 2017-04-13 DIAGNOSIS — E782 Mixed hyperlipidemia: Secondary | ICD-10-CM | POA: Diagnosis not present

## 2017-04-13 DIAGNOSIS — I10 Essential (primary) hypertension: Secondary | ICD-10-CM | POA: Diagnosis not present

## 2017-04-13 DIAGNOSIS — E291 Testicular hypofunction: Secondary | ICD-10-CM | POA: Diagnosis not present

## 2017-04-13 DIAGNOSIS — Z6831 Body mass index (BMI) 31.0-31.9, adult: Secondary | ICD-10-CM | POA: Diagnosis not present

## 2017-04-22 DIAGNOSIS — R634 Abnormal weight loss: Secondary | ICD-10-CM | POA: Diagnosis not present

## 2017-04-22 DIAGNOSIS — R159 Full incontinence of feces: Secondary | ICD-10-CM | POA: Insufficient documentation

## 2017-04-22 DIAGNOSIS — Z6831 Body mass index (BMI) 31.0-31.9, adult: Secondary | ICD-10-CM | POA: Diagnosis not present

## 2017-04-26 DIAGNOSIS — G4733 Obstructive sleep apnea (adult) (pediatric): Secondary | ICD-10-CM | POA: Diagnosis not present

## 2017-04-27 DIAGNOSIS — E782 Mixed hyperlipidemia: Secondary | ICD-10-CM | POA: Diagnosis not present

## 2017-04-27 DIAGNOSIS — E291 Testicular hypofunction: Secondary | ICD-10-CM | POA: Diagnosis not present

## 2017-04-27 DIAGNOSIS — Z6832 Body mass index (BMI) 32.0-32.9, adult: Secondary | ICD-10-CM | POA: Diagnosis not present

## 2017-04-27 DIAGNOSIS — I10 Essential (primary) hypertension: Secondary | ICD-10-CM | POA: Diagnosis not present

## 2017-05-03 DIAGNOSIS — D1801 Hemangioma of skin and subcutaneous tissue: Secondary | ICD-10-CM | POA: Diagnosis not present

## 2017-05-03 DIAGNOSIS — L812 Freckles: Secondary | ICD-10-CM | POA: Diagnosis not present

## 2017-05-03 DIAGNOSIS — L821 Other seborrheic keratosis: Secondary | ICD-10-CM | POA: Diagnosis not present

## 2017-05-17 ENCOUNTER — Other Ambulatory Visit: Payer: Self-pay | Admitting: Cardiology

## 2017-05-18 DIAGNOSIS — Z6832 Body mass index (BMI) 32.0-32.9, adult: Secondary | ICD-10-CM | POA: Diagnosis not present

## 2017-05-18 DIAGNOSIS — E291 Testicular hypofunction: Secondary | ICD-10-CM | POA: Diagnosis not present

## 2017-05-18 DIAGNOSIS — E669 Obesity, unspecified: Secondary | ICD-10-CM | POA: Diagnosis not present

## 2017-05-31 ENCOUNTER — Encounter: Payer: Self-pay | Admitting: Internal Medicine

## 2017-05-31 ENCOUNTER — Ambulatory Visit (INDEPENDENT_AMBULATORY_CARE_PROVIDER_SITE_OTHER): Payer: PPO | Admitting: Internal Medicine

## 2017-05-31 VITALS — BP 108/70 | HR 63 | Ht 76.0 in | Wt 256.0 lb

## 2017-05-31 DIAGNOSIS — R159 Full incontinence of feces: Secondary | ICD-10-CM | POA: Diagnosis not present

## 2017-05-31 NOTE — Patient Instructions (Signed)
Continue taking your probiotic for an additional 6 weeks.  Take 2 tablespoons of Metamucil daily.  Purchase some protective liners/pads.  Please follow up as needed

## 2017-05-31 NOTE — Progress Notes (Signed)
HISTORY OF PRESENT ILLNESS:  Brian Cortez is a 66 y.o. male with multiple medical problems as listed below who has been followed in this office for chronic GERD and adenomatous colon polyp surveillance. He is sent today by his primary care provider's office, Dr. Dagmar Hait, with chief complaint of fecal incontinence. The patient states that his problem began approximately a year ago. He will notice rectal discharge anywhere between 5 and 30 minutes after defecation. This stains his undergarments. He generally has 2-3 bowel movements per day. He can notice this problem after each bowel movement. He denies diarrhea or constipation. No abdominal pain. Some flatulence. He continues on PPI for GERD with good control. He did undergo complete colonoscopy April 2017. This was normal except for sigmoid diverticulosis and a diminutive non-adenomatous colon polyp. Follow-up continues recommended. No significant hemorrhoids. He was evaluated by his PCPs nurse practitioner last month. No hemorrhoids. Advised to try probiotic which she has. He feels this has helped in terms of less frequency. No other complaints.  REVIEW OF SYSTEMS:  All non-GI ROS negative except for sinus and allergy trouble, anxiety, insomnia  Past Medical History:  Diagnosis Date  . Anxiety   . Bladder tumor   . Carpal tunnel syndrome, left    intermittant  . Chronic seasonal allergic rhinitis   . Coronary artery disease    per Dr Aundra Dubin (cardiologist) note --- noted on CT chest coronary calcification -- ETT-Cardiolite normal in 2014  . DDD (degenerative disc disease), cervical   . DDD (degenerative disc disease), cervical   . Diverticulosis of colon   . GERD (gastroesophageal reflux disease)   . Hemorrhoids   . Hiatal hernia   . History of adenomatous polyp of colon    2001;  2007;  2012  . History of bladder cancer urologist-  dr Gaynelle Arabian   11-06-2013  s/p TURBT and BCG tx's  . History of panic attacks   . History of urethral  stricture   . Hyperlipidemia   . Hypertension   . Hypogonadism in male   . Nonischemic cardiomyopathy [facility])    cardiologist-  dr Aundra Dubin-- per last echo 01/ 2016 ef 50-55%  (up from 09/ 2014 ef 45-50%)  . OSA on CPAP    since 2014    Past Surgical History:  Procedure Laterality Date  . CARDIOVASCULAR STRESS TEST  08-21-2013  dr Aundra Dubin   normal nuclear study w/ no ischemia/  normal LV function and wall motion , ef 63%  . CATARACT EXTRACTION W/ INTRAOCULAR LENS  IMPLANT, BILATERAL  2013  . COLONOSCOPY  last one 03-03-2016  . CYSTO/  DIRECT VISION INTERNAL URETEROTOMY  11/25/2006  . LAPAROSCOPIC CHOLECYSTECTOMY  10/15/2006  . ORBITAL FRACTURE SURGERY  1986   left eye:  Jan 1986--- fixation of fractures, repair of optic nerve decompression , and complex closure  . TONSILLECTOMY  child  . TRANSTHORACIC ECHOCARDIOGRAM  12-16-2014   dr Aundra Dubin   grade 1 diastolic dysfunction, ef 33-82% (improved from last study in 2014 , previous ef 45-50%) /  trivial MR and TR/  mild LAE/  inferior vena cava dilated, respirophasic diameter changes were blunted (<50%), consistant w/ elevated central venous pressure  . TRANSURETHRAL RESECTION OF BLADDER TUMOR WITH GYRUS (TURBT-GYRUS) N/A 11/06/2013   Procedure: TRANSURETHRAL RESECTION OF BLADDER TUMOR WITH GYRUS (TURBT-GYRUS);  Surgeon: Ailene Rud, MD;  Location: [facility];  Service: Urology;  Laterality: N/A;  . TRANSURETHRAL RESECTION OF BLADDER TUMOR WITH MITOMYCIN-C N/A 12/09/2016   Procedure: CYSTOSCOPY  TRANSURETHRAL RESECTION OF BLADDER TUMOR WITH MITOMYCIN-C;  Surgeon: Carolan Clines, MD;  Location: [facility];  Service: Urology;  Laterality: N/A;  . VARICOSE VEIN SURGERY  2002    Social History Brian Cortez  reports that he has never smoked. He has never used smokeless tobacco. He reports that he drinks alcohol. He reports that he does not use drugs.  family history includes Heart attack in his paternal grandfather and  paternal uncle; Heart attack (age of onset: 66) in his father; Heart attack (age of onset: 75) in his maternal uncle; Prostate cancer in his paternal uncle; Stroke (age of onset: 40) in his father.  Allergies  Allergen Reactions  . Pollen Extract Cough    "sinus issues"       PHYSICAL EXAMINATION: Vital signs: BP 108/70   Pulse 63   Ht 6\' 4"  (1.93 m)   Wt 256 lb (116.1 kg)   BMI 31.16 kg/m   Constitutional: generally well-appearing, no acute distress Psychiatric: alert and oriented x3, cooperative Eyes: extraocular movements intact, anicteric, conjunctiva pink Mouth: oral pharynx moist, no lesions Neck: supple no lymphadenopathy Cardiovascular: heart regular rate and rhythm, no murmur Lungs: clear to auscultation bilaterally Abdomen: soft, nontender, nondistended, no obvious ascites, no peritoneal signs, normal bowel sounds, no organomegaly Rectal:No external abnormalities. Sensation intact. Normal anal reflux. No tenderness. Soft brown Hemoccult negative stool. Normal sphincter tone Extremities: no clubbing cyanosis or lower extremity edema bilaterally Skin: no lesions on visible extremities Neuro: No focal deficits. Normal DTRs.  ASSESSMENT:  #1. Minor fecal soilage. No worrisome features. Negative physical exam #2. History of adenomatous colon polyps. Negative colonoscopy last year #3. GERD. Symptoms controlled with PPI   PLAN:  #1. Continue probiotic for 4 weeks #2. Introduce Metamucil 2 tablespoons daily to improve bowel consistency #3. Recommended protective pad in his undergarments to avoid soiling #4. Routine follow-up colonoscopy recommended around 2027 #5. Resume general care with PCP. GI follow-up as needed  45 minutes spent face-to-face with the patient. Greater than 50% a time use for counseling regarding management of his fecal incontinence

## 2017-06-01 DIAGNOSIS — E291 Testicular hypofunction: Secondary | ICD-10-CM | POA: Diagnosis not present

## 2017-06-01 DIAGNOSIS — I10 Essential (primary) hypertension: Secondary | ICD-10-CM | POA: Diagnosis not present

## 2017-06-01 DIAGNOSIS — E782 Mixed hyperlipidemia: Secondary | ICD-10-CM | POA: Diagnosis not present

## 2017-06-01 DIAGNOSIS — Z6831 Body mass index (BMI) 31.0-31.9, adult: Secondary | ICD-10-CM | POA: Diagnosis not present

## 2017-06-02 NOTE — Addendum Note (Signed)
Addendum  created 06/02/17 1531 by Josephine Igo, MD   Sign clinical note

## 2017-06-02 NOTE — Anesthesia Postprocedure Evaluation (Signed)
Anesthesia Post Note  Patient: Brian Cortez  Procedure(s) Performed: Procedure(s) (LRB): CYSTOSCOPY TRANSURETHRAL RESECTION OF BLADDER TUMOR WITH MITOMYCIN-C (N/A)     Anesthesia Post Evaluation  Last Vitals:  Vitals:   12/09/16 1130 12/09/16 1354  BP: 125/81 (!) 158/86  Pulse: 65 67  Resp: 11 12  Temp:  36.7 C    Last Pain:  Vitals:   12/09/16 1130  TempSrc:   PainSc: 0-No pain                 Myrtice Lowdermilk A.

## 2017-06-09 ENCOUNTER — Ambulatory Visit (INDEPENDENT_AMBULATORY_CARE_PROVIDER_SITE_OTHER): Payer: PPO | Admitting: Cardiology

## 2017-06-09 ENCOUNTER — Encounter: Payer: Self-pay | Admitting: Cardiology

## 2017-06-09 VITALS — BP 126/78 | HR 61 | Ht 76.0 in | Wt 259.6 lb

## 2017-06-09 DIAGNOSIS — M25551 Pain in right hip: Secondary | ICD-10-CM | POA: Diagnosis not present

## 2017-06-09 DIAGNOSIS — E782 Mixed hyperlipidemia: Secondary | ICD-10-CM | POA: Diagnosis not present

## 2017-06-09 DIAGNOSIS — I420 Dilated cardiomyopathy: Secondary | ICD-10-CM

## 2017-06-09 DIAGNOSIS — I2510 Atherosclerotic heart disease of native coronary artery without angina pectoris: Secondary | ICD-10-CM

## 2017-06-09 DIAGNOSIS — I10 Essential (primary) hypertension: Secondary | ICD-10-CM | POA: Diagnosis not present

## 2017-06-09 DIAGNOSIS — M1611 Unilateral primary osteoarthritis, right hip: Secondary | ICD-10-CM | POA: Diagnosis not present

## 2017-06-09 MED ORDER — CARVEDILOL 3.125 MG PO TABS: 3.1250 mg | ORAL_TABLET | Freq: Two times a day (BID) | ORAL | 3 refills | Status: DC

## 2017-06-09 NOTE — Progress Notes (Signed)
Cardiology Office Note:    Date:  06/09/2017   ID:  Early [name], DOB [birth year], MRN [id number]  PCP:  Prince Solian, MD  Cardiologist:  Fransico Him, MD    Referring MD: Prince Solian, MD   Chief Complaint  Patient presents with  . Hypertension  . Hyperlipidemia  . Coronary Artery Disease    History of Present Illness:    Brian Cortez is a 66 y.o. male with a hx of with history of hyperlipidemia, HTN, DCM with EF 45-50% with mild global hypokinesis, coronary artery calcification on a chest CT with normal nuclear stress test and EF was 63% on the Cardiolite (contrasting with the echo). He also has a history of bladder cancer and had resection and BCG treatment.   He is here today for followup. He is doing well from a cardiac standpoint.  He denies any chest pain or pressure.  He denies any SOB, DOE, PND, orthopnea, LE edema or syncope. Rarely he will have a few skipped heart beats. He occasionally has some mild lightheadedness when going from sitting to standing.    Past Medical History:  Diagnosis Date  . Anxiety   . Bladder tumor   . Carpal tunnel syndrome, left    intermittant  . Chronic seasonal allergic rhinitis   . Coronary artery disease    per Dr Aundra Dubin (cardiologist) note --- noted on CT chest coronary calcification -- ETT-Cardiolite normal in 2014  . DDD (degenerative disc disease), cervical   . DDD (degenerative disc disease), cervical   . Diverticulosis of colon   . GERD (gastroesophageal reflux disease)   . Hemorrhoids   . Hiatal hernia   . History of adenomatous polyp of colon    2001;  2007;  2012  . History of bladder cancer urologist-  dr Gaynelle Arabian   11-06-2013  s/p TURBT and BCG tx's  . History of panic attacks   . History of urethral stricture   . Hyperlipidemia   . Hypertension   . Hypogonadism in male   . Nonischemic cardiomyopathy [facility])    cardiologist-  dr Aundra Dubin-- per last echo 01/ 2016 ef 50-55%  (up from 09/ 2014 ef 45-50%)   . OSA on CPAP    since 2014    Past Surgical History:  Procedure Laterality Date  . CARDIOVASCULAR STRESS TEST  08-21-2013  dr Aundra Dubin   normal nuclear study w/ no ischemia/  normal LV function and wall motion , ef 63%  . CATARACT EXTRACTION W/ INTRAOCULAR LENS  IMPLANT, BILATERAL  2013  . COLONOSCOPY  last one 03-03-2016  . CYSTO/  DIRECT VISION INTERNAL URETEROTOMY  11/25/2006  . LAPAROSCOPIC CHOLECYSTECTOMY  10/15/2006  . ORBITAL FRACTURE SURGERY  1986   left eye:  Jan 1986--- fixation of fractures, repair of optic nerve decompression , and complex closure  . TONSILLECTOMY  child  . TRANSTHORACIC ECHOCARDIOGRAM  12-16-2014   dr Aundra Dubin   grade 1 diastolic dysfunction, ef 40-98% (improved from last study in 2014 , previous ef 45-50%) /  trivial MR and TR/  mild LAE/  inferior vena cava dilated, respirophasic diameter changes were blunted (<50%), consistant w/ elevated central venous pressure  . TRANSURETHRAL RESECTION OF BLADDER TUMOR WITH GYRUS (TURBT-GYRUS) N/A 11/06/2013   Procedure: TRANSURETHRAL RESECTION OF BLADDER TUMOR WITH GYRUS (TURBT-GYRUS);  Surgeon: Ailene Rud, MD;  Location: [facility];  Service: Urology;  Laterality: N/A;  . TRANSURETHRAL RESECTION OF BLADDER TUMOR WITH MITOMYCIN-C N/A 12/09/2016   Procedure: CYSTOSCOPY TRANSURETHRAL RESECTION  OF BLADDER TUMOR WITH MITOMYCIN-C;  Surgeon: Carolan Clines, MD;  Location[facility];  Service: Urology;  Laterality: N/A;  . VARICOSE VEIN SURGERY  2002    Current Medications: Current Meds  Medication Sig  . aspirin 81 MG tablet Take 81 mg by mouth every evening.   Marland Kitchen atorvastatin (LIPITOR) 40 MG tablet Take 40 mg by mouth every evening.   . bimatoprost (LUMIGAN) 0.03 % ophthalmic solution 1 drop at bedtime.  . carvedilol (COREG) 6.25 MG tablet Take 1 tablet (6.25 mg total) by mouth 2 (two) times daily. NEEDS OFFICE VISIT FOR FURTHER REFILLS  . CIALIS 20 MG tablet Take 20 mg by mouth daily as needed  for erectile dysfunction.   Marland Kitchen escitalopram (LEXAPRO) 20 MG tablet Take 20 mg by mouth every morning.   . fluticasone (FLONASE) 50 MCG/ACT nasal spray FLUTICASONE PROPIONATE 50 MCG/ACT SUSP---  bid prn  . Multiple Vitamins-Minerals (CENTRUM SILVER PO) Take 1 tablet by mouth every morning.   . Multiple Vitamins-Minerals (PRESERVISION AREDS 2 PO) Take 1 capsule by mouth 2 (two) times daily.   . Omega-3 Fatty Acids (FISH OIL) 1000 MG CAPS Take 1,000 mg by mouth 2 (two) times daily.  . pantoprazole (PROTONIX) 40 MG tablet TAKE ONE TABLET EACH DAY (Patient taking differently: TAKE ONE TABLET EACH DAY-- takes in am)  . tamsulosin (FLOMAX) 0.4 MG CAPS capsule Take 1 capsule (0.4 mg total) by mouth daily. (Patient taking differently: Take 0.8 mg by mouth daily after supper. )  . testosterone cypionate (DEPOTESTOTERONE CYPIONATE) 100 MG/ML injection Inject 300 mg into the muscle every 14 (fourteen) days. For IM use only     Allergies:   Pollen extract   Social History   Social History  . Marital status: Married    Spouse name: Pamala Hurry  . Number of children: 2  . Years of education: Post Grad   Occupational History  . [employer]  . [employer]   Social History Main Topics  . Smoking status: Never Smoker  . Smokeless tobacco: Never Used  . Alcohol use 0.0 oz/week     Comment: occasional  . Drug use: No  . Sexual activity: Not Asked     Comment: VASECTOMY   Other Topics Concern  . None   Social History Narrative   Patient lives at home with spouse.    Caffeine Use: 3-4 cups     Family History: The patient's family history includes Heart attack in his paternal grandfather and paternal uncle; Heart attack (age of onset: 67) in his father; Heart attack (age of onset: 64) in his maternal uncle; Prostate cancer in his paternal uncle; Stroke (age of onset: 75) in his father. There is no history of Colon cancer or Diabetes. ROS:   Please see the history of  present illness.     All other systems reviewed and are negative.  EKGs/Labs/Other Studies Reviewed:    The following studies were reviewed today: none  EKG:  EKG is  ordered today.  The ekg ordered today demonstrates NSR at 61bpm with no ST change and LAFB Recent Labs: 12/09/2016: Hemoglobin 16.7; Potassium 4.2; Sodium 139  Recent Lipid Panel    Component Value Date/Time   CHOL 130 09/11/2014 0738   TRIG 285.0 (H) 09/11/2014 0738   HDL 34.40 (L) 09/11/2014 0738   CHOLHDL 4 09/11/2014 0738   VLDL 57.0 (H) 09/11/2014 0738   LDLCALC 42 10/16/2013 0823   LDLDIRECT 67.7 09/11/2014 0277  Physical Exam:    VS:  BP 126/78   Pulse 61   Ht 6\' 4"  (1.93 m)   Wt 259 lb 9.6 oz (117.8 kg)   BMI 31.60 kg/m     Wt Readings from Last 3 Encounters:  06/09/17 259 lb 9.6 oz (117.8 kg)  05/31/17 256 lb (116.1 kg)  01/17/17 276 lb 3.2 oz (125.3 kg)     GEN:  Well nourished, well developed in no acute distress HEENT: Normal NECK: No JVD; No carotid bruits LYMPHATICS: No lymphadenopathy CARDIAC: RRR, no murmurs, rubs, gallops RESPIRATORY:  Clear to auscultation without rales, wheezing or rhonchi  ABDOMEN: Soft, non-tender, non-distended MUSCULOSKELETAL:  No edema; No deformity  SKIN: Warm and dry NEUROLOGIC:  Alert and oriented x 3 PSYCHIATRIC:  Normal affect   ASSESSMENT:    1. Coronary artery calcification seen on CAT scan   2. Dilated cardiomyopathy (HCC) Chronic  3. Essential hypertension   4. HYPERLIPIDEMIA    PLAN:    In order of problems listed above:  1. Coronary artery calcifications with no ischemia on nuclear stress test.  He has not had any anginal symptoms. He will continue on ASA 81mg  daily and statin.   2. DCM - EF by echo was 50-55% but by nuclear stress test was normal. He has no symptoms of CHF.  He appears euvolemic on exam and weight is stable.  I will repeat echo since he has not had one in 2 years to make sure that EF is stable.   3. HTN - his BP is  well controlled on exam today.  He will continue on Carvedilol although dropping the dose to 3.125mg  BID due to orthostatic dizziness.  4. Hyperlipidemia with LDL goal < 70. He will continue on Lipitor 40mg  daily.  I will get a copy of his FLP from his PCP.   5.   Lightheadedness - this appears orthostatic.  His BP drops mildly with orthostatic BPs with symptoms of dizziness.  I will decrease his Carvedilol to 3.125mg  BID and he will check his BP daily for a week and all with the results.    Medication Adjustments/Labs and Tests Ordered: Current medicines are reviewed at length with the patient today.  Concerns regarding medicines are outlined above.  No orders of the defined types were placed in this encounter.  No orders of the defined types were placed in this encounter.   Signed, Fransico Him, MD  06/09/2017 11:55 AM    [place]

## 2017-06-09 NOTE — Patient Instructions (Addendum)
Medication Instructions:  1) DECREASE COREG to 3.125 mg TWICE DAILY Check your blood pressure daily for a week and let us know results.  Labwork: None  Testing/Procedures: Your physician has requested that you have an echocardiogram. Echocardiography is a painless test that uses sound waves to create images of your heart. It provides your doctor with information about the size and shape of your heart and how well your heart's chambers and valves are working. This procedure takes approximately one hour. There are no restrictions for this procedure.  Follow-Up: Your physician wants you to follow-up in: 1 year with Dr. Radford Pax. You will receive a reminder letter in the mail two months in advance. If you don't receive a letter, please call our office to schedule the follow-up appointment.   Any Other Special Instructions Will Be Listed Below (If Applicable).     If you need a refill on your cardiac medications before your next appointment, please call your pharmacy.

## 2017-06-15 DIAGNOSIS — E291 Testicular hypofunction: Secondary | ICD-10-CM | POA: Diagnosis not present

## 2017-06-17 ENCOUNTER — Encounter: Payer: Self-pay | Admitting: Cardiology

## 2017-06-17 ENCOUNTER — Ambulatory Visit (HOSPITAL_COMMUNITY): Payer: PPO | Attending: Cardiology

## 2017-06-17 ENCOUNTER — Other Ambulatory Visit: Payer: Self-pay

## 2017-06-17 DIAGNOSIS — I420 Dilated cardiomyopathy: Secondary | ICD-10-CM | POA: Insufficient documentation

## 2017-06-17 DIAGNOSIS — G4733 Obstructive sleep apnea (adult) (pediatric): Secondary | ICD-10-CM | POA: Insufficient documentation

## 2017-06-17 DIAGNOSIS — I428 Other cardiomyopathies: Secondary | ICD-10-CM | POA: Diagnosis not present

## 2017-06-17 DIAGNOSIS — I340 Nonrheumatic mitral (valve) insufficiency: Secondary | ICD-10-CM | POA: Diagnosis not present

## 2017-06-17 DIAGNOSIS — I119 Hypertensive heart disease without heart failure: Secondary | ICD-10-CM | POA: Diagnosis not present

## 2017-06-17 DIAGNOSIS — E785 Hyperlipidemia, unspecified: Secondary | ICD-10-CM | POA: Diagnosis not present

## 2017-06-17 DIAGNOSIS — I2510 Atherosclerotic heart disease of native coronary artery without angina pectoris: Secondary | ICD-10-CM | POA: Insufficient documentation

## 2017-06-17 DIAGNOSIS — I5189 Other ill-defined heart diseases: Secondary | ICD-10-CM

## 2017-06-17 DIAGNOSIS — I429 Cardiomyopathy, unspecified: Secondary | ICD-10-CM | POA: Diagnosis present

## 2017-06-17 DIAGNOSIS — I272 Pulmonary hypertension, unspecified: Secondary | ICD-10-CM

## 2017-06-17 DIAGNOSIS — I517 Cardiomegaly: Secondary | ICD-10-CM

## 2017-06-17 HISTORY — DX: Other ill-defined heart diseases: I51.89

## 2017-06-17 HISTORY — DX: Pulmonary hypertension, unspecified: I27.20

## 2017-06-17 HISTORY — DX: Cardiomegaly: I51.7

## 2017-06-19 ENCOUNTER — Encounter: Payer: Self-pay | Admitting: Cardiology

## 2017-06-20 NOTE — Telephone Encounter (Signed)
Pt aware of recommendations ./cy 

## 2017-06-27 ENCOUNTER — Telehealth: Payer: Self-pay

## 2017-06-27 DIAGNOSIS — I272 Pulmonary hypertension, unspecified: Secondary | ICD-10-CM

## 2017-06-27 NOTE — Telephone Encounter (Signed)
Repeat ECHO ordered to be scheduled in 1 year. 

## 2017-06-27 NOTE — Telephone Encounter (Signed)
-----   Message from Sueanne Margarita, MD sent at 06/17/2017 12:56 PM EDT ----- Brian Cortez showed normal LVF with mild LVH, increased stiffness of heart muscle, mildly dilated LA and mild pulmonary HTN - repeat echo in 1 year for pulmonary HTN

## 2017-07-06 DIAGNOSIS — R7301 Impaired fasting glucose: Secondary | ICD-10-CM | POA: Diagnosis not present

## 2017-07-06 DIAGNOSIS — I10 Essential (primary) hypertension: Secondary | ICD-10-CM | POA: Diagnosis not present

## 2017-07-06 DIAGNOSIS — E291 Testicular hypofunction: Secondary | ICD-10-CM | POA: Diagnosis not present

## 2017-07-07 DIAGNOSIS — M25551 Pain in right hip: Secondary | ICD-10-CM | POA: Diagnosis not present

## 2017-07-13 DIAGNOSIS — E784 Other hyperlipidemia: Secondary | ICD-10-CM | POA: Diagnosis not present

## 2017-07-13 DIAGNOSIS — E291 Testicular hypofunction: Secondary | ICD-10-CM | POA: Diagnosis not present

## 2017-07-13 DIAGNOSIS — Z6831 Body mass index (BMI) 31.0-31.9, adult: Secondary | ICD-10-CM | POA: Diagnosis not present

## 2017-07-13 DIAGNOSIS — C679 Malignant neoplasm of bladder, unspecified: Secondary | ICD-10-CM | POA: Diagnosis not present

## 2017-07-13 DIAGNOSIS — R748 Abnormal levels of other serum enzymes: Secondary | ICD-10-CM | POA: Diagnosis not present

## 2017-07-13 DIAGNOSIS — I5189 Other ill-defined heart diseases: Secondary | ICD-10-CM | POA: Diagnosis not present

## 2017-07-13 DIAGNOSIS — R7301 Impaired fasting glucose: Secondary | ICD-10-CM | POA: Diagnosis not present

## 2017-07-13 DIAGNOSIS — I10 Essential (primary) hypertension: Secondary | ICD-10-CM | POA: Diagnosis not present

## 2017-07-13 DIAGNOSIS — M25551 Pain in right hip: Secondary | ICD-10-CM | POA: Diagnosis not present

## 2017-07-27 DIAGNOSIS — E291 Testicular hypofunction: Secondary | ICD-10-CM | POA: Diagnosis not present

## 2017-07-29 DIAGNOSIS — H401113 Primary open-angle glaucoma, right eye, severe stage: Secondary | ICD-10-CM | POA: Diagnosis not present

## 2017-07-29 DIAGNOSIS — M1611 Unilateral primary osteoarthritis, right hip: Secondary | ICD-10-CM | POA: Diagnosis not present

## 2017-07-29 DIAGNOSIS — M25551 Pain in right hip: Secondary | ICD-10-CM | POA: Diagnosis not present

## 2017-07-29 DIAGNOSIS — M7061 Trochanteric bursitis, right hip: Secondary | ICD-10-CM | POA: Diagnosis not present

## 2017-08-02 DIAGNOSIS — G4733 Obstructive sleep apnea (adult) (pediatric): Secondary | ICD-10-CM | POA: Diagnosis not present

## 2017-08-10 DIAGNOSIS — E291 Testicular hypofunction: Secondary | ICD-10-CM | POA: Diagnosis not present

## 2017-08-24 DIAGNOSIS — E291 Testicular hypofunction: Secondary | ICD-10-CM | POA: Diagnosis not present

## 2017-09-07 DIAGNOSIS — E291 Testicular hypofunction: Secondary | ICD-10-CM | POA: Diagnosis not present

## 2017-09-10 ENCOUNTER — Other Ambulatory Visit: Payer: Self-pay | Admitting: Internal Medicine

## 2017-09-13 ENCOUNTER — Encounter: Payer: Self-pay | Admitting: Neurology

## 2017-09-14 ENCOUNTER — Ambulatory Visit (INDEPENDENT_AMBULATORY_CARE_PROVIDER_SITE_OTHER): Payer: PPO | Admitting: Neurology

## 2017-09-14 ENCOUNTER — Encounter: Payer: Self-pay | Admitting: Neurology

## 2017-09-14 VITALS — BP 125/80 | HR 65 | Ht 75.0 in | Wt 257.0 lb

## 2017-09-14 DIAGNOSIS — G4733 Obstructive sleep apnea (adult) (pediatric): Secondary | ICD-10-CM | POA: Diagnosis not present

## 2017-09-14 DIAGNOSIS — E669 Obesity, unspecified: Secondary | ICD-10-CM | POA: Diagnosis not present

## 2017-09-14 DIAGNOSIS — Z9989 Dependence on other enabling machines and devices: Secondary | ICD-10-CM

## 2017-09-14 NOTE — Progress Notes (Signed)
[facility]  Provider:  Larey Cortez, M D  Referring Provider: Prince Solian, MD Primary Care Physician:  Brian Solian, MD  Chief Complaint  Patient presents with  . Follow-up    pt alone, rm 10. pt states CPAP is working well for him. DME: [facility]    HPI:  Brian Cortez is a 66 y.o. male seen here as a revisit on 09-14-2017, Brian Cortez is seen here today for a yearly revisit. He has been very compliant CPAP user with 5% compliance averaging 8 hours and 23 minutes at night set pressure is 8 cm water with 27 cm EPR, residual AHI is 0.8 he does not have major air leaks in the low residual AHI is equally distributed between central and obstructive events. He has lost weight visibly. He endorsed the Epworth sleepiness score at 2 points the fatigue severity score at 11 points the geriatric depression score at 1 out of 15 points. He is retired, he travels and enjoys golf. He is taking Administrator.     Fatigue and lightheadedness were improved, until last month. He has no longer nocturia since being on CPAP. In 2016 he went to the ED and was diagnosed with diastolic heart failure, had a nuclear test with Brian Cortez - improved on Coreg.  His medical history has changed drastically over the last 12 month, he suffered a congestive heart failure episode and has been controlled by Coreg.  His bladder cancer was discovered in Dec 2014. Underwent BCG treatment. Has a 30% re-occurrence risk. He had pneumonia and changed PCPs.  The data collection began on 07-28-14 and ended on 08-26-14 CPAP is used at 8 cm water pressure with 2 cm EPR the residual AHI is 1.6 which is excellent,  the patient uses a machine on average 8 hours and 24 minutes at night and has 100% compliance for nightly use  over 4 hours. Median daily usage 8 hours 11 minutes.  Interval history history from 09-09-15, the patient's 100% compliant for the number of 30 days and each of these days over 4 hours  of consecutive use has been registered. On average he uses his machine for 8 hours and 47 minutes set pressure is 8 cm water with 2 cm expiratory pressure relief ( EPR) the residual AHI is 1.4 he has minimal air leaks, his Epworth sleepiness score today is 1. fatigue severity 6 points and the geriatric depression score is endorsed at one point.  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 When I saw him last year he had been suffering from pneumonia and his primary care physician, Brian Cortez, had treated him. The patient is a bladder cancer survivor and has been diagnosed with diastolic heart failure. His cardiac function had improved on Coreg. I have followed him for obstructive sleep apnea and he is a CPAP compliant patient with 100% of use of his CPAP at average use of 8 hours and 54 minutes nightly. CPAP is set at 8 cm water pressure this 2 cm EPR and a residual AHI of 1.4. Minor air leaks are noted, these have no clinical bearing.    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  Sleeping better, SOB today,  coughing. has ciliary injection, hay fever.  Severe  allergic rhinitis. Obese. The Epworth sleepiness score is 2 and the fatigue severity score is 18 points, the geriatric depression score is 2 out of 15.    Social History   Social History  . Marital status: Married    Spouse name: Brian Cortez  . Number of children: 2  . Years of education: Post Grad   Occupational History  . [employer]  . [employer]   Social History Main Topics  . Smoking status: Never Smoker  . Smokeless tobacco: Never Used  . Alcohol use 0.0 oz/week     Comment: occasional  . Drug use: No  . Sexual  activity: Not on file     Comment: VASECTOMY   Other Topics Concern  . Not on file   Social History Narrative   Patient lives at home with spouse.    Caffeine Use: 3-4 cups    Family History  Problem Relation Age of Onset  . Heart attack Father 35  . Stroke Father 63  . Heart attack Paternal Grandfather        in 1s  . Prostate cancer Paternal Uncle   . Heart attack Paternal Uncle         > 55  . Heart attack Maternal Uncle 80       > 55  . Colon cancer Neg Hx   . Diabetes Neg Hx     Past Medical History:  Diagnosis Date  . Anxiety   . Bladder tumor   . Carpal tunnel syndrome, left    intermittant  . Chronic seasonal allergic rhinitis   . Coronary artery disease    per Dr Aundra Dubin (cardiologist) note --- noted on CT chest coronary calcification -- ETT-Cardiolite normal in 2014  . DDD (degenerative disc disease), cervical   . DDD (degenerative disc disease), cervical   . Diverticulosis of colon   . GERD (gastroesophageal reflux disease)   . Hemorrhoids   . Hiatal hernia   . History of adenomatous polyp of colon    2001;  2007;  2012  . History of bladder cancer urologist-  dr Gaynelle Cortez   11-06-2013  s/p TURBT and BCG tx's  . History of panic attacks   . History of urethral stricture   . Hyperlipidemia   . Hypertension   . Hypogonadism in male   . Nonischemic cardiomyopathy [facility])    cardiologist-  dr Aundra Dubin-- per last echo 01/ 2016 ef 50-55%  (up from 09/ 2014 ef 45-50%)  . OSA on CPAP    since 2014    Past Surgical History:  Procedure Laterality Date  . CARDIOVASCULAR STRESS TEST  08-21-2013  dr Aundra Dubin   normal nuclear study w/ no ischemia/  normal LV function and wall motion , ef 63%  . CATARACT EXTRACTION W/ INTRAOCULAR LENS  IMPLANT, BILATERAL  2013  . COLONOSCOPY  last one 03-03-2016  . CYSTO/  DIRECT VISION INTERNAL URETEROTOMY  11/25/2006  . LAPAROSCOPIC CHOLECYSTECTOMY  10/15/2006  . ORBITAL FRACTURE SURGERY  1986   left eye:  Jan 1986---  fixation of fractures, repair of optic nerve decompression , and complex closure  . TONSILLECTOMY  child  . TRANSTHORACIC ECHOCARDIOGRAM  12-16-2014   dr Aundra Dubin   grade 1 diastolic dysfunction, ef 60-10% (improved from last study in 2014 , previous ef 45-50%) /  trivial MR and TR/  mild LAE/  inferior vena cava dilated, respirophasic diameter changes were blunted (<50%),  consistant w/ elevated central venous pressure  . TRANSURETHRAL RESECTION OF BLADDER TUMOR WITH GYRUS (TURBT-GYRUS) N/A 11/06/2013   Procedure: TRANSURETHRAL RESECTION OF BLADDER TUMOR WITH GYRUS (TURBT-GYRUS);  Surgeon: Ailene Rud, MD;  Location: [facility];  Service: Urology;  Laterality: N/A;  . TRANSURETHRAL RESECTION OF BLADDER TUMOR WITH MITOMYCIN-C N/A 12/09/2016   Procedure: CYSTOSCOPY TRANSURETHRAL RESECTION OF BLADDER TUMOR WITH MITOMYCIN-C;  Surgeon: Carolan Clines, MD;  Location: [place];  Service: Urology;  Laterality: N/A;  . VARICOSE VEIN SURGERY  2002    Current Outpatient Prescriptions  Medication Sig Dispense Refill  . aspirin 81 MG tablet Take 81 mg by mouth every evening.     Marland Kitchen atorvastatin (LIPITOR) 40 MG tablet Take 40 mg by mouth every evening.     . bimatoprost (LUMIGAN) 0.03 % ophthalmic solution 1 drop at bedtime.    . carvedilol (COREG) 3.125 MG tablet Take 1 tablet (3.125 mg total) by mouth 2 (two) times daily. 180 tablet 3  . CIALIS 20 MG tablet Take 20 mg by mouth daily as needed for erectile dysfunction.     Marland Kitchen escitalopram (LEXAPRO) 20 MG tablet Take 20 mg by mouth every morning.     . fluticasone (FLONASE) 50 MCG/ACT nasal spray FLUTICASONE PROPIONATE 50 MCG/ACT SUSP---  bid prn    . losartan (COZAAR) 25 MG tablet     . Multiple Vitamins-Minerals (CENTRUM SILVER PO) Take 1 tablet by mouth every morning.     . Multiple Vitamins-Minerals (PRESERVISION AREDS 2 PO) Take 1 capsule by mouth 2 (two) times daily.     Marland Kitchen OVER THE COUNTER MEDICATION 1 capsule 2 (two) times  daily. ARREDS 2- supplement for macular degeneration    . pantoprazole (PROTONIX) 40 MG tablet TAKE ONE TABLET EACH DAY 30 tablet 6  . tamsulosin (FLOMAX) 0.4 MG CAPS capsule Take 1 capsule (0.4 mg total) by mouth daily. (Patient taking differently: Take 0.8 mg by mouth daily after supper. ) 30 capsule 0  . testosterone cypionate (DEPOTESTOTERONE CYPIONATE) 100 MG/ML injection Inject 300 mg into the muscle every 14 (fourteen) days. For IM use only     No current facility-administered medications for this visit.     Allergies as of 09/14/2017 - Review Complete 09/14/2017  Allergen Reaction Noted  . Pollen extract Cough 01/13/2013    Vitals: BP 125/80   Pulse 65   Ht 6\' 3"  (1.905 m)   Wt 257 lb (116.6 kg)   BMI 32.12 kg/m  Last Weight:  Wt Readings from Last 1 Encounters:  09/14/17 257 lb (116.6 kg)   Last Height:   Ht Readings from Last 1 Encounters:  09/14/17 6\' 3"  (1.905 m)     General: The patient is awake, alert and appears  in acute distress from hay fever and sinusitis. Nasal congestion is noted.  The patient is well groomed. He has visible pressure marks.  Head: Normocephalic, atraumatic. Neck is supple. 19 inches  Circumference, from 21 .   Mallampati 3, neck circumference: unchanged.  Retrognathia is noted .  Cardiovascular: Regular rate and rhythm, without murmurs or carotid bruit, and without distended neck veins. Respiratory: Lungs are clear to auscultation. He has faster RR 23- min.  Skin:  Swollen ankles, affecting the ankle sensation for vibration. .   Trunk: BMI reduced to 32,  he has an erect posture.   Neurologic exam : The patient is awake and alert, oriented to place and time.   Memory subjective  described as intact. He  has noticed a delay in remembering names.   Cranial nerves: Pupils are equal and briskly reactive to light.   Nystagmus with gaze to the left , unrestricted visual field . Visual fields by finger perimetry are intact. Facial motor  strength is symmetric and tongue and uvula move midline. Hearing intact.  DTR intact, symmetrical. Babinski downgoing.   Assessment:  After physical and neurologic examination, review of laboratory studies, imaging, neurophysiology testing and pre-existing records, assessment will be reviewed on the problem list. Sinusitis with clear serose otitis, rhinitis and vestibular involvement. He has taken a z pack and steroids, gained weight, larger neck and more puffy feet.   OSA -  100% compliance for the last 4 years  , on CPAP well controlled.    Plan:  Treatment plan and additional workup :  New hose,  new filter for CPAP, new nasal pillow needed after this period of allergic/ inflammatory sinusitis.  CPAP 8 cm ,  2 cm EPR -  Information and education were provided in previous visit.  Rv in 12 month, for follow up on compliance, which has been excellent. He will stay with [facility].  Brian Seat, MD  Dr Dagmar Hait.

## 2017-09-14 NOTE — Patient Instructions (Signed)

## 2017-09-21 DIAGNOSIS — E291 Testicular hypofunction: Secondary | ICD-10-CM | POA: Diagnosis not present

## 2017-09-30 DIAGNOSIS — I10 Essential (primary) hypertension: Secondary | ICD-10-CM | POA: Diagnosis not present

## 2017-09-30 DIAGNOSIS — E7849 Other hyperlipidemia: Secondary | ICD-10-CM | POA: Diagnosis not present

## 2017-09-30 DIAGNOSIS — Z6832 Body mass index (BMI) 32.0-32.9, adult: Secondary | ICD-10-CM | POA: Diagnosis not present

## 2017-09-30 DIAGNOSIS — R7301 Impaired fasting glucose: Secondary | ICD-10-CM | POA: Diagnosis not present

## 2017-09-30 DIAGNOSIS — E291 Testicular hypofunction: Secondary | ICD-10-CM | POA: Diagnosis not present

## 2017-10-05 DIAGNOSIS — E291 Testicular hypofunction: Secondary | ICD-10-CM | POA: Diagnosis not present

## 2017-10-12 DIAGNOSIS — E291 Testicular hypofunction: Secondary | ICD-10-CM | POA: Diagnosis not present

## 2017-10-12 DIAGNOSIS — Z0000 Encounter for general adult medical examination without abnormal findings: Secondary | ICD-10-CM | POA: Diagnosis not present

## 2017-10-12 DIAGNOSIS — I10 Essential (primary) hypertension: Secondary | ICD-10-CM | POA: Diagnosis not present

## 2017-10-24 DIAGNOSIS — E291 Testicular hypofunction: Secondary | ICD-10-CM | POA: Diagnosis not present

## 2017-10-24 DIAGNOSIS — N400 Enlarged prostate without lower urinary tract symptoms: Secondary | ICD-10-CM | POA: Diagnosis not present

## 2017-10-26 DIAGNOSIS — E291 Testicular hypofunction: Secondary | ICD-10-CM | POA: Diagnosis not present

## 2017-11-09 DIAGNOSIS — E291 Testicular hypofunction: Secondary | ICD-10-CM | POA: Diagnosis not present

## 2017-11-09 DIAGNOSIS — G4733 Obstructive sleep apnea (adult) (pediatric): Secondary | ICD-10-CM | POA: Diagnosis not present

## 2017-11-14 DIAGNOSIS — J018 Other acute sinusitis: Secondary | ICD-10-CM | POA: Diagnosis not present

## 2017-11-14 DIAGNOSIS — R509 Fever, unspecified: Secondary | ICD-10-CM | POA: Diagnosis not present

## 2017-11-14 DIAGNOSIS — R0981 Nasal congestion: Secondary | ICD-10-CM | POA: Diagnosis not present

## 2017-11-14 DIAGNOSIS — R05 Cough: Secondary | ICD-10-CM | POA: Diagnosis not present

## 2017-11-29 DIAGNOSIS — E291 Testicular hypofunction: Secondary | ICD-10-CM | POA: Diagnosis not present

## 2017-12-05 DIAGNOSIS — H401132 Primary open-angle glaucoma, bilateral, moderate stage: Secondary | ICD-10-CM | POA: Diagnosis not present

## 2017-12-14 DIAGNOSIS — E291 Testicular hypofunction: Secondary | ICD-10-CM | POA: Diagnosis not present

## 2017-12-15 DIAGNOSIS — I10 Essential (primary) hypertension: Secondary | ICD-10-CM | POA: Diagnosis not present

## 2017-12-23 DIAGNOSIS — E291 Testicular hypofunction: Secondary | ICD-10-CM | POA: Diagnosis not present

## 2017-12-23 DIAGNOSIS — I10 Essential (primary) hypertension: Secondary | ICD-10-CM | POA: Diagnosis not present

## 2017-12-23 DIAGNOSIS — E7849 Other hyperlipidemia: Secondary | ICD-10-CM | POA: Diagnosis not present

## 2017-12-23 DIAGNOSIS — Z125 Encounter for screening for malignant neoplasm of prostate: Secondary | ICD-10-CM | POA: Diagnosis not present

## 2017-12-23 DIAGNOSIS — E298 Other testicular dysfunction: Secondary | ICD-10-CM | POA: Diagnosis not present

## 2017-12-23 DIAGNOSIS — R7301 Impaired fasting glucose: Secondary | ICD-10-CM | POA: Diagnosis not present

## 2017-12-23 DIAGNOSIS — R82998 Other abnormal findings in urine: Secondary | ICD-10-CM | POA: Diagnosis not present

## 2017-12-28 DIAGNOSIS — C672 Malignant neoplasm of lateral wall of bladder: Secondary | ICD-10-CM | POA: Diagnosis not present

## 2017-12-28 DIAGNOSIS — N5201 Erectile dysfunction due to arterial insufficiency: Secondary | ICD-10-CM | POA: Diagnosis not present

## 2017-12-28 DIAGNOSIS — E291 Testicular hypofunction: Secondary | ICD-10-CM | POA: Diagnosis not present

## 2017-12-30 DIAGNOSIS — C679 Malignant neoplasm of bladder, unspecified: Secondary | ICD-10-CM | POA: Diagnosis not present

## 2017-12-30 DIAGNOSIS — E298 Other testicular dysfunction: Secondary | ICD-10-CM | POA: Diagnosis not present

## 2017-12-30 DIAGNOSIS — R7301 Impaired fasting glucose: Secondary | ICD-10-CM | POA: Diagnosis not present

## 2017-12-30 DIAGNOSIS — I10 Essential (primary) hypertension: Secondary | ICD-10-CM | POA: Diagnosis not present

## 2017-12-30 DIAGNOSIS — E7849 Other hyperlipidemia: Secondary | ICD-10-CM | POA: Diagnosis not present

## 2017-12-30 DIAGNOSIS — F418 Other specified anxiety disorders: Secondary | ICD-10-CM | POA: Diagnosis not present

## 2017-12-30 DIAGNOSIS — Z1389 Encounter for screening for other disorder: Secondary | ICD-10-CM | POA: Diagnosis not present

## 2017-12-30 DIAGNOSIS — G4739 Other sleep apnea: Secondary | ICD-10-CM | POA: Diagnosis not present

## 2017-12-30 DIAGNOSIS — Z0000 Encounter for general adult medical examination without abnormal findings: Secondary | ICD-10-CM | POA: Diagnosis not present

## 2017-12-30 DIAGNOSIS — K635 Polyp of colon: Secondary | ICD-10-CM | POA: Diagnosis not present

## 2017-12-30 DIAGNOSIS — Z6831 Body mass index (BMI) 31.0-31.9, adult: Secondary | ICD-10-CM | POA: Diagnosis not present

## 2018-01-18 DIAGNOSIS — E291 Testicular hypofunction: Secondary | ICD-10-CM | POA: Diagnosis not present

## 2018-01-24 DIAGNOSIS — H401132 Primary open-angle glaucoma, bilateral, moderate stage: Secondary | ICD-10-CM | POA: Diagnosis not present

## 2018-02-06 DIAGNOSIS — H401132 Primary open-angle glaucoma, bilateral, moderate stage: Secondary | ICD-10-CM | POA: Diagnosis not present

## 2018-02-08 DIAGNOSIS — E291 Testicular hypofunction: Secondary | ICD-10-CM | POA: Diagnosis not present

## 2018-02-10 DIAGNOSIS — H401132 Primary open-angle glaucoma, bilateral, moderate stage: Secondary | ICD-10-CM | POA: Insufficient documentation

## 2018-02-15 DIAGNOSIS — H401132 Primary open-angle glaucoma, bilateral, moderate stage: Secondary | ICD-10-CM | POA: Diagnosis not present

## 2018-02-22 DIAGNOSIS — E291 Testicular hypofunction: Secondary | ICD-10-CM | POA: Diagnosis not present

## 2018-03-08 DIAGNOSIS — E291 Testicular hypofunction: Secondary | ICD-10-CM | POA: Diagnosis not present

## 2018-03-15 DIAGNOSIS — H401132 Primary open-angle glaucoma, bilateral, moderate stage: Secondary | ICD-10-CM | POA: Diagnosis not present

## 2018-03-22 DIAGNOSIS — E291 Testicular hypofunction: Secondary | ICD-10-CM | POA: Diagnosis not present

## 2018-03-22 DIAGNOSIS — H353131 Nonexudative age-related macular degeneration, bilateral, early dry stage: Secondary | ICD-10-CM | POA: Diagnosis not present

## 2018-03-22 DIAGNOSIS — H35433 Paving stone degeneration of retina, bilateral: Secondary | ICD-10-CM | POA: Diagnosis not present

## 2018-03-22 DIAGNOSIS — H3530 Unspecified macular degeneration: Secondary | ICD-10-CM | POA: Diagnosis not present

## 2018-03-22 DIAGNOSIS — H43811 Vitreous degeneration, right eye: Secondary | ICD-10-CM | POA: Diagnosis not present

## 2018-04-05 DIAGNOSIS — E291 Testicular hypofunction: Secondary | ICD-10-CM | POA: Diagnosis not present

## 2018-04-26 DIAGNOSIS — E291 Testicular hypofunction: Secondary | ICD-10-CM | POA: Diagnosis not present

## 2018-05-01 DIAGNOSIS — C672 Malignant neoplasm of lateral wall of bladder: Secondary | ICD-10-CM | POA: Diagnosis not present

## 2018-05-02 ENCOUNTER — Telehealth: Payer: Self-pay | Admitting: Cardiology

## 2018-05-02 NOTE — Telephone Encounter (Signed)
New Message      [facility] Pre-operative Risk Assessment    Request for surgical clearance:  1. What type of surgery is being performed?  Transurethral resection of the bladder tumor  2. When is this surgery scheduled? TBD  3. What type of clearance is required (medical clearance vs. Pharmacy clearance to hold med vs. Both)? Both  4. Are there any medications that need to be held prior to surgery and how long? Hold Aspirin 81 5 days prior  5. Practice name and name of physician performing surgery? [facility]/ Dr. Festus Aloe  6. What is your office phone number [phone] ext. [phone]   7.   What is your office fax number [phone]  8.   Anesthesia type (None, local, MAC, general) ? General   Brian Cortez 05/02/2018, 2:19 PM  _________________________________________________________________   (provider comments below)

## 2018-05-03 NOTE — Telephone Encounter (Signed)
   Primary Cardiologist: Dr. Radford Pax   Chart reviewed as part of pre-operative protocol coverage. Patient was contacted 05/03/2018 in reference to pre-operative risk assessment for pending surgery as outlined below.  Early [name] was last seen 05/2017 by Dr. Radford Pax.  Since that time, MELVILLE ENGEN has done well w/o anginal symptoms. He works out with a Insurance underwriter 4 days a week. No exertional CP or dyspnea.   Therefore, based on ACC/AHA guidelines, the patient would be at acceptable risk for the planned procedure without further cardiovascular testing.   ASA can be held if absolutely necessary, and resume once safe from a surgical standpoint.   I will route this recommendation to the requesting party via Epic fax function and remove from pre-op pool.  Please call with questions.  Lyda Jester, PA-C 05/03/2018, 4:02 PM

## 2018-05-04 DIAGNOSIS — D1801 Hemangioma of skin and subcutaneous tissue: Secondary | ICD-10-CM | POA: Diagnosis not present

## 2018-05-04 DIAGNOSIS — L812 Freckles: Secondary | ICD-10-CM | POA: Diagnosis not present

## 2018-05-04 DIAGNOSIS — L820 Inflamed seborrheic keratosis: Secondary | ICD-10-CM | POA: Diagnosis not present

## 2018-05-04 DIAGNOSIS — L821 Other seborrheic keratosis: Secondary | ICD-10-CM | POA: Diagnosis not present

## 2018-05-04 DIAGNOSIS — L570 Actinic keratosis: Secondary | ICD-10-CM | POA: Diagnosis not present

## 2018-05-05 ENCOUNTER — Other Ambulatory Visit: Payer: Self-pay | Admitting: Urology

## 2018-05-10 ENCOUNTER — Other Ambulatory Visit: Payer: Self-pay

## 2018-05-10 ENCOUNTER — Encounter (HOSPITAL_BASED_OUTPATIENT_CLINIC_OR_DEPARTMENT_OTHER): Payer: Self-pay

## 2018-05-10 NOTE — Progress Notes (Signed)
Spoke with:  Herbie Baltimore NPO:  After Midnight, no gum, candy, or mints   Arrival time:  1941DE Labs: Istat 4 (EKG 06/09/2017) AM medications: Escitalopram, Losartan, Pantoprazole, Flonase, Eye Drop Pre op orders: Yes Ride home:  Pamala Hurry (wife) [phone] Bringing CPAP machine DOS

## 2018-05-10 NOTE — Pre-Procedure Instructions (Signed)
Dr. Kalman Shan made aware of Mr. Ovens history of mild pulmonary hypertension per ECHO 06/18/2017.  Dr. Kalman Shan is ok for Mr. Brandenburg to have his procedure 05/16/2017 at the [facility].  Dr. Ashok Norris Cardiac clearance 05/02/2018 in epic and on patient's chart.

## 2018-05-15 DIAGNOSIS — G4733 Obstructive sleep apnea (adult) (pediatric): Secondary | ICD-10-CM | POA: Diagnosis not present

## 2018-05-15 NOTE — H&P (Signed)
Office Visit Report     05/01/2018   --------------------------------------------------------------------------------   Brian Cortez  MRN: [id number]  PRIMARY CARE:  Montez Morita, MD  DOB: 1951-05-18, 67 year old Male  REFERRING:  Scott A. MacDiarmid, MD  SSN: -[id number]  PROVIDER:  Festus Aloe, M.D.    LOCATION:  [facility] [phone]   --------------------------------------------------------------------------------   CC: I have bladder cancer that has been treated.  HPI: Brian Cortez is a 67 year-old male established patient who is here for follow-up of bladder cancer treatment.  His bladder cancer was superficial and limitied to the bladder lining. His bladder cancer was not muscle invasive. He had treatment with the following intravesical agents: bcg. Patient denies mitomycin, interferon, and adriamycin.   He did have a TURBT. His last bladder tumor was resected 11/06/2013. He has had the following number of bladder resections: 1. He did not have a radical cystectomy for the treatment of his bladder cancer. He did not have chemotherapy for treamtent of his bladder cancer. He did not have radiation to treat his bladder cancer. He is not here for an intravesical treatment today.   He has not had blood in his urine recently.   His last cysto was 12/29/2017. His last radiologic test to evaluate the kidneys was 11/26/2016.   -Dec 2014 -- Patient with high-grade TA left bladder, 8 cm tumor  -Jan 2018 -- Repeat TUR - necrotic-appearing lesion showed squamous keratin and necrotic dysplastic squamous cells.   He returns and has had no gross hematuria. No voiding difficulty.     AUA Symptom Score: He never has the sensation of not emptying his bladder completely after finishing urinating. He never has to urinate again less that two hours after he has finished urinating. He does not have to stop and start again several times when he urinates. He never finds it  difficult to postpone urination. He never has a weak urinary stream. He never has to push or strain to begin urination. He never has to get up to urinate from the time he goes to bed until the time he gets up in the morning.   Calculated AUA Symptom Score: 0    ALLERGIES: No Allergies    MEDICATIONS: Lipitor 40 mg tablet  Tamsulosin Hcl 0.4 mg capsule 2 tablet PO Daily  Aspirin Ec 81 mg tablet, delayed release  Carvedilol 3.125 MG Oral Tablet Oral  Latanoprost 0.005 % drops Ophthalmic  Lexapro 5 mg tablet Oral  Losartan Potassium 25 mg tablet  Lumigan 0.01 % drops  Multi-Day Vitamins TABS Oral  Omega 3  Protonix 40 mg granules delayed release for susp packet Oral  Sildenafil 20 mg tablet take 1-5 tabs as directed  Tadalafil 20Mg  1 troche PO as directed  Testosterone Cypionate 200 MG/ML Intramuscular Solution 1.5 ml IM Q2WK     GU PSH: Bladder Instill AntiCA Agent - 12/09/2016 Cystoscopy - 12/28/2017, 03/14/2017, 12/01/2016 Cystoscopy TURBT >5 cm - 2014 Cystoscopy TURBT 2-5 cm - 12/09/2016 Locm 300-399Mg /Ml Iodine,1Ml - 11/26/2016    NON-GU PSH: Cholecystectomy (open) Eye Surgery (Unspecified)    GU PMH: Bladder Cancer Lateral - 12/28/2017, - 03/14/2017, - 12/01/2016, Cancer of lateral wall of urinary bladder, - 11/10/2015 ED due to arterial insufficiency - 12/28/2017, - 12/01/2016, Erectile dysfunction due to arterial insufficiency, - 2016 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/2017, - 12/01/2016 (Stable), - 05/05/2016, Hypogonadism, testicular, - 2016 BPH w/o LUTS - 12/01/2016, Enlarged prostate without lower urinary tract symptoms (luts), - 2016 Bladder Cancer, Unspec,  Stage I papillary adenocarcinoma of bladder - 2016 Urinary Tract Inf, Unspec site, Urinary tract infection - 2016 Other microscopic hematuria, Microscopic hematuria - 2016 Bladder, Neoplasm of Unspecified behavior, Bladder tumor - 2015 Acute Cystitis/UTI, Acute cystitis without hematuria - 2015 Urethral Stricture, Unspec, Urethral  stricture - 2015 Gross hematuria, Gross hematuria - 2014 History of urolithiasis, Nephrolithiasis - 2014      PMH Notes:  2006-12-12 14:53:55 - Note: Urinary Symptoms  2013-08-23 83:41:96 - Note: Diastolic Congestive Heart Failure   NON-GU PMH: Other specified postprocedural states, Culture urine. No ABX unless culture proven UTI - 12/17/2016 Postherpetic polyneuropathy, Shingles (herpes zoster) polyneuropathy - 2017 Encounter for general adult medical examination without abnormal findings, Encounter for preventive health examination - 2016 Personal history of other diseases of the digestive system, History of esophageal reflux - 2014 Personal history of other diseases of the nervous system and sense organs, History of glaucoma - 2014    FAMILY HISTORY: cardiac disorder - Runs In Family   SOCIAL HISTORY: Marital Status: Married Preferred Language: English; Ethnicity: Not Hispanic Or Latino; Race: White Current Smoking Status: Patient has never smoked.   Tobacco Use Assessment Completed: Used Tobacco in last 30 days? Drinks 2 drinks per day.  Drinks 3 caffeinated drinks per day. Patient's occupation Financial controller.     Notes: 2 children   REVIEW OF SYSTEMS:    GU Review Male:   Patient denies frequent urination, hard to postpone urination, burning/ pain with urination, get up at night to urinate, leakage of urine, stream starts and stops, trouble starting your stream, have to strain to urinate , erection problems, and penile pain.  Gastrointestinal (Upper):   Patient denies nausea, vomiting, and indigestion/ heartburn.  Gastrointestinal (Lower):   Patient denies diarrhea and constipation.  Constitutional:   Patient denies fever, night sweats, weight loss, and fatigue.  Skin:   Patient denies skin rash/ lesion and itching.  Eyes:   Patient denies blurred vision and double vision.  Ears/ Nose/ Throat:   Patient denies sore throat and sinus problems.  Hematologic/Lymphatic:   Patient  denies swollen glands and easy bruising.  Cardiovascular:   Patient denies leg swelling and chest pains.  Respiratory:   Patient denies cough and shortness of breath.  Endocrine:   Patient denies excessive thirst.  Musculoskeletal:   Patient reports back pain. Patient denies joint pain.  Neurological:   Patient denies headaches and dizziness.  Psychologic:   Patient denies depression and anxiety.   Notes: discomfort in R testicle    VITAL SIGNS:      05/01/2018 02:34 PM  Weight 258 lb / 117.03 kg  Height 75 in / 190.5 cm  BP 151/99 mmHg  Heart Rate 73 /min  Temperature 98.2 F / 36.7 C  BMI 32.2 kg/m   GU PHYSICAL EXAMINATION:    Scrotum: No lesions. No edema. No cysts. No warts.  Urethral Meatus: Normal size. No lesion, no wart, no discharge, no polyp. Normal location.  Penis: Circumcised, no warts, no cracks. No dorsal Peyronie's plaques, no left corporal Peyronie's plaques, no right corporal Peyronie's plaques, no scarring, no warts. No balanitis, no meatal stenosis.   MULTI-SYSTEM PHYSICAL EXAMINATION:    Constitutional: Well-nourished. No physical deformities. Normally developed. Good grooming.  Neck: Neck symmetrical, not swollen. Normal tracheal position.  Respiratory: No labored breathing, no use of accessory muscles. Normal breath sounds.  Cardiovascular: Regular rate and rhythm. Normal temperature, normal extremity pulses, no swelling, no varicosities.   Skin: No paleness, no  jaundice, no cyanosis. No lesion, no ulcer, no rash.  Neurologic / Psychiatric: Oriented to time, oriented to place, oriented to person. No depression, no anxiety, no agitation.  Gastrointestinal: No mass, no tenderness, no rigidity, non obese abdomen.     PAST DATA REVIEWED:  Source Of History:  Patient   10/24/17 05/21/14 02/08/13 06/16/12 02/02/11 04/17/09 01/06/09 08/02/06  PSA  Total PSA 1.21 ng/mL 1.08  0.72  1.08  0.63  0.48  0.59  0.29     10/24/17 05/21/14 02/08/13 12/05/12 09/22/12  04/07/12 09/29/11 03/23/11  Hormones  Testosterone, Total 232.8 ng/dL 170  246  206.94  519.33  415.47  274.45  739.94     PROCEDURES:         Flexible Cystoscopy - 52000  Risks, benefits, and some of the potential complications of the procedure were discussed with the patient. All questions were answered. Informed consent was obtained. Antibiotic prophylaxis was given -- Cephalexin. Sterile technique and intraurethral analgesia were used.  Meatus:  Normal size. Normal location. Normal condition.  Urethra:  No strictures.  External Sphincter:  Normal.  Verumontanum:  Normal.  Prostate:  Non-obstructing. No hyperplasia.  Bladder Neck:  Non-obstructing.  Ureteral Orifices:  Normal location left. Normal size. Normal shape. Effluxed clear urine. Not certain I could see the right.   Bladder:  No trabeculation. No stones. Necrotic lesion at the trigone and squamous metaplasia progressing to the right       The lower urinary tract was carefully examined. The procedure was well-tolerated and without complications. Antibiotic instructions were given. Instructions were given to call the office immediately for bloody urine, difficulty urinating, painful urination, fever, chills, nausea, vomiting or other illness. The patient stated that he understood these instructions and would comply with them.         Urinalysis Dipstick Dipstick Cont'd  Color: Yellow Bilirubin: Neg  Appearance: Clear Ketones: Neg  Specific Gravity: 1.015 Blood: Neg  pH: <=5.0 Protein: Neg  Glucose: Neg Urobilinogen: 0.2    Nitrites: Neg    Leukocyte Esterase: Neg    ASSESSMENT:      ICD-10 Details  1 GU:   Bladder Cancer Lateral - C67.2    PLAN:           Schedule Return Visit/Planned Activity: Next Available Appointment - Schedule Surgery          Document Letter(s):  Created for Patient: Clinical Summary         Notes:   Necrotic-appearing lesion at the trigone with progressive squamous metaplasia-I discussed  with the patient in the major risks benefits and alternatives to TURBT and elected to proceed. We discussed concern about premalignant nature of these lesions which can lead to squamous cell cancer in the bladder. We can also she retrogrades and he may need a right ureteral stent. He's been stable from cardiac point of view but will get clearance from Dr. Radford Pax.   cc: Dr. Dagmar Hait;  Dr. Radford Pax        Next Appointment:      Next Appointment: 05/17/2018 09:00 AM    Appointment Type: Nurse Visit    Location: [facility] [phone]    Provider: NVPodA NVPodA    Reason for Visit: 2 wk inj      * Signed by Festus Aloe, M.D. on 05/01/18 at 9:18 PM (EDT)*     The information contained in this medical record document is considered private and confidential patient information. This information can only be  used for the medical diagnosis and/or medical services that are being provided by the patient's selected caregivers. This information can only be distributed outside of the patient's care if the patient agrees and signs waivers of authorization for this information to be sent to an outside source or route.

## 2018-05-16 ENCOUNTER — Encounter (HOSPITAL_BASED_OUTPATIENT_CLINIC_OR_DEPARTMENT_OTHER)
Admission: RE | Disposition: A | Discharge status: Home / Self Care | Payer: Self-pay | Source: Ambulatory Visit | Attending: Urology

## 2018-05-16 ENCOUNTER — Ambulatory Visit (HOSPITAL_BASED_OUTPATIENT_CLINIC_OR_DEPARTMENT_OTHER)
Admission: RE | Admit: 2018-05-16 | Discharge: 2018-05-16 | Disposition: A | Discharge status: Home / Self Care | Payer: PPO | Source: Ambulatory Visit | Attending: Urology | Admitting: Urology

## 2018-05-16 ENCOUNTER — Encounter (HOSPITAL_BASED_OUTPATIENT_CLINIC_OR_DEPARTMENT_OTHER): Payer: Self-pay

## 2018-05-16 ENCOUNTER — Ambulatory Visit (HOSPITAL_BASED_OUTPATIENT_CLINIC_OR_DEPARTMENT_OTHER): Payer: PPO | Admitting: Anesthesiology

## 2018-05-16 DIAGNOSIS — G4730 Sleep apnea, unspecified: Secondary | ICD-10-CM | POA: Diagnosis not present

## 2018-05-16 DIAGNOSIS — C670 Malignant neoplasm of trigone of bladder: Secondary | ICD-10-CM | POA: Diagnosis not present

## 2018-05-16 DIAGNOSIS — M1990 Unspecified osteoarthritis, unspecified site: Secondary | ICD-10-CM | POA: Diagnosis not present

## 2018-05-16 DIAGNOSIS — D090 Carcinoma in situ of bladder: Secondary | ICD-10-CM | POA: Diagnosis not present

## 2018-05-16 DIAGNOSIS — Z6833 Body mass index (BMI) 33.0-33.9, adult: Secondary | ICD-10-CM | POA: Insufficient documentation

## 2018-05-16 DIAGNOSIS — I5030 Unspecified diastolic (congestive) heart failure: Secondary | ICD-10-CM | POA: Diagnosis not present

## 2018-05-16 DIAGNOSIS — Z7982 Long term (current) use of aspirin: Secondary | ICD-10-CM | POA: Diagnosis not present

## 2018-05-16 DIAGNOSIS — I110 Hypertensive heart disease with heart failure: Secondary | ICD-10-CM | POA: Diagnosis not present

## 2018-05-16 DIAGNOSIS — D494 Neoplasm of unspecified behavior of bladder: Secondary | ICD-10-CM | POA: Diagnosis not present

## 2018-05-16 DIAGNOSIS — Z79899 Other long term (current) drug therapy: Secondary | ICD-10-CM | POA: Insufficient documentation

## 2018-05-16 DIAGNOSIS — Z9049 Acquired absence of other specified parts of digestive tract: Secondary | ICD-10-CM | POA: Diagnosis not present

## 2018-05-16 DIAGNOSIS — K219 Gastro-esophageal reflux disease without esophagitis: Secondary | ICD-10-CM | POA: Insufficient documentation

## 2018-05-16 DIAGNOSIS — F419 Anxiety disorder, unspecified: Secondary | ICD-10-CM | POA: Diagnosis not present

## 2018-05-16 DIAGNOSIS — Z8551 Personal history of malignant neoplasm of bladder: Secondary | ICD-10-CM | POA: Insufficient documentation

## 2018-05-16 DIAGNOSIS — I428 Other cardiomyopathies: Secondary | ICD-10-CM | POA: Insufficient documentation

## 2018-05-16 DIAGNOSIS — I10 Essential (primary) hypertension: Secondary | ICD-10-CM | POA: Diagnosis not present

## 2018-05-16 DIAGNOSIS — D414 Neoplasm of uncertain behavior of bladder: Secondary | ICD-10-CM

## 2018-05-16 HISTORY — DX: Dilated cardiomyopathy: I42.0

## 2018-05-16 HISTORY — DX: Cardiomegaly: I51.7

## 2018-05-16 HISTORY — DX: Personal history of urinary calculi: Z87.442

## 2018-05-16 HISTORY — DX: Heart disease, unspecified: I51.9

## 2018-05-16 HISTORY — DX: Pulmonary hypertension, unspecified: I27.20

## 2018-05-16 HISTORY — PX: TRANSURETHRAL RESECTION OF BLADDER TUMOR: SHX2575

## 2018-05-16 HISTORY — DX: Full incontinence of feces: R15.9

## 2018-05-16 LAB — POCT I-STAT 4, (NA,K, GLUC, HGB,HCT)
Glucose, Bld: 110 mg/dL — ABNORMAL HIGH (ref 70–99)
HEMATOCRIT: 50 % (ref 39.0–52.0)
Hemoglobin: 17 g/dL (ref 13.0–17.0)
Potassium: 4.2 mmol/L (ref 3.5–5.1)
SODIUM: 140 mmol/L (ref 135–145)

## 2018-05-16 SURGERY — TURBT (TRANSURETHRAL RESECTION OF BLADDER TUMOR)
Anesthesia: General | Laterality: Bilateral

## 2018-05-16 MED ORDER — EPHEDRINE 5 MG/ML INJ
INTRAVENOUS | Status: AC
  Filled 2018-05-16: qty 10

## 2018-05-16 MED ORDER — DEXAMETHASONE SODIUM PHOSPHATE 4 MG/ML IJ SOLN
INTRAMUSCULAR | Status: DC | PRN
  Administered 2018-05-16: 10 mg via INTRAVENOUS

## 2018-05-16 MED ORDER — MIDAZOLAM HCL 5 MG/5ML IJ SOLN
INTRAMUSCULAR | Status: DC | PRN
  Administered 2018-05-16: 2 mg via INTRAVENOUS

## 2018-05-16 MED ORDER — LIDOCAINE 2% (20 MG/ML) 5 ML SYRINGE
INTRAMUSCULAR | Status: AC
  Filled 2018-05-16: qty 5

## 2018-05-16 MED ORDER — PROPOFOL 10 MG/ML IV BOLUS
INTRAVENOUS | Status: AC
  Filled 2018-05-16: qty 20

## 2018-05-16 MED ORDER — CEFAZOLIN SODIUM-DEXTROSE 2-4 GM/100ML-% IV SOLN
INTRAVENOUS | Status: AC
  Filled 2018-05-16: qty 100

## 2018-05-16 MED ORDER — CEPHALEXIN 500 MG PO CAPS: 500.0000 mg | ORAL_CAPSULE | Freq: Every day | ORAL | 0 refills | Status: AC

## 2018-05-16 MED ORDER — FENTANYL CITRATE (PF) 100 MCG/2ML IJ SOLN
25.0000 ug | INTRAMUSCULAR | Status: DC | PRN
  Filled 2018-05-16: qty 1

## 2018-05-16 MED ORDER — LACTATED RINGERS IV SOLN
INTRAVENOUS | Status: DC
  Administered 2018-05-16: 1000 mL via INTRAVENOUS
  Filled 2018-05-16: qty 1000

## 2018-05-16 MED ORDER — PROPOFOL 10 MG/ML IV BOLUS
INTRAVENOUS | Status: DC | PRN
  Administered 2018-05-16: 200 mg via INTRAVENOUS

## 2018-05-16 MED ORDER — ONDANSETRON HCL 4 MG/2ML IJ SOLN
INTRAMUSCULAR | Status: DC | PRN
  Administered 2018-05-16: 4 mg via INTRAVENOUS

## 2018-05-16 MED ORDER — MEPERIDINE HCL 25 MG/ML IJ SOLN
6.2500 mg | INTRAMUSCULAR | Status: DC | PRN
  Filled 2018-05-16: qty 1

## 2018-05-16 MED ORDER — MIDAZOLAM HCL 2 MG/2ML IJ SOLN
0.5000 mg | Freq: Once | INTRAMUSCULAR | Status: DC | PRN
  Filled 2018-05-16: qty 2

## 2018-05-16 MED ORDER — ONDANSETRON HCL 4 MG/2ML IJ SOLN
INTRAMUSCULAR | Status: AC
  Filled 2018-05-16: qty 2

## 2018-05-16 MED ORDER — MIDAZOLAM HCL 2 MG/2ML IJ SOLN
INTRAMUSCULAR | Status: AC
  Filled 2018-05-16: qty 2

## 2018-05-16 MED ORDER — EPHEDRINE SULFATE-NACL 50-0.9 MG/10ML-% IV SOSY
PREFILLED_SYRINGE | INTRAVENOUS | Status: DC | PRN
  Administered 2018-05-16: 10 mg via INTRAVENOUS

## 2018-05-16 MED ORDER — SODIUM CHLORIDE 0.9 % IR SOLN
Status: DC | PRN
  Administered 2018-05-16: 3000 mL via INTRAVESICAL

## 2018-05-16 MED ORDER — FENTANYL CITRATE (PF) 100 MCG/2ML IJ SOLN
INTRAMUSCULAR | Status: DC | PRN
  Administered 2018-05-16 (×2): 50 ug via INTRAVENOUS

## 2018-05-16 MED ORDER — PROMETHAZINE HCL 25 MG/ML IJ SOLN
6.2500 mg | INTRAMUSCULAR | Status: DC | PRN
Start: 2018-05-16 — End: 2018-05-16
  Filled 2018-05-16: qty 1

## 2018-05-16 MED ORDER — LIDOCAINE 2% (20 MG/ML) 5 ML SYRINGE
INTRAMUSCULAR | Status: DC | PRN
  Administered 2018-05-16: 40 mg via INTRAVENOUS

## 2018-05-16 MED ORDER — CEFAZOLIN SODIUM-DEXTROSE 2-4 GM/100ML-% IV SOLN
2.0000 g | Freq: Once | INTRAVENOUS | Status: AC
  Administered 2018-05-16: 2 g via INTRAVENOUS
  Filled 2018-05-16: qty 100

## 2018-05-16 MED ORDER — FENTANYL CITRATE (PF) 100 MCG/2ML IJ SOLN
INTRAMUSCULAR | Status: AC
  Filled 2018-05-16: qty 2

## 2018-05-16 MED ORDER — DEXAMETHASONE SODIUM PHOSPHATE 10 MG/ML IJ SOLN
INTRAMUSCULAR | Status: AC
  Filled 2018-05-16: qty 1

## 2018-05-16 MED ORDER — IOHEXOL 300 MG/ML  SOLN
INTRAMUSCULAR | Status: DC | PRN
  Administered 2018-05-16: 20 mL via URETHRAL

## 2018-05-16 SURGICAL SUPPLY — 32 items
BAG DRAIN URO-CYSTO SKYTR STRL (DRAIN) ×3 IMPLANT
BAG DRN ANRFLXCHMBR STRAP LEK (BAG)
BAG DRN UROCATH (DRAIN) ×1
BAG URINE DRAINAGE (UROLOGICAL SUPPLIES) ×2 IMPLANT
BAG URINE LEG 19OZ MD ST LTX (BAG) IMPLANT
CATH FOLEY 2WAY SLVR  5CC 16FR (CATHETERS) ×2
CATH FOLEY 2WAY SLVR  5CC 20FR (CATHETERS)
CATH FOLEY 2WAY SLVR  5CC 22FR (CATHETERS)
CATH FOLEY 2WAY SLVR 5CC 16FR (CATHETERS) IMPLANT
CATH FOLEY 2WAY SLVR 5CC 20FR (CATHETERS) IMPLANT
CATH FOLEY 2WAY SLVR 5CC 22FR (CATHETERS) IMPLANT
CATH URET 5FR 28IN CONE TIP (BALLOONS) ×2
CATH URET 5FR 70CM CONE TIP (BALLOONS) IMPLANT
CLOTH BEACON ORANGE TIMEOUT ST (SAFETY) ×3 IMPLANT
DRSG TELFA 3X8 NADH (GAUZE/BANDAGES/DRESSINGS) IMPLANT
ELECT REM PT RETURN 9FT ADLT (ELECTROSURGICAL)
ELECTRODE REM PT RTRN 9FT ADLT (ELECTROSURGICAL) ×1 IMPLANT
EVACUATOR MICROVAS BLADDER (UROLOGICAL SUPPLIES) ×2 IMPLANT
GLOVE BIO SURGEON STRL SZ7.5 (GLOVE) ×3 IMPLANT
GOWN STRL REUS W/TWL LRG LVL3 (GOWN DISPOSABLE) ×3 IMPLANT
HOLDER FOLEY CATH W/STRAP (MISCELLANEOUS) IMPLANT
KIT TURNOVER CYSTO (KITS) ×3 IMPLANT
LOOP CUT BIPOLAR 24F LRG (ELECTROSURGICAL) ×2 IMPLANT
MANIFOLD NEPTUNE II (INSTRUMENTS) ×2 IMPLANT
NS IRRIG 1000ML POUR BTL (IV SOLUTION) ×2 IMPLANT
PACK CYSTO (CUSTOM PROCEDURE TRAY) ×3 IMPLANT
PAD DRESSING TELFA 3X8 NADH (GAUZE/BANDAGES/DRESSINGS) IMPLANT
PLUG CATH AND CAP STER (CATHETERS) IMPLANT
TUBE CONNECTING 12'X1/4 (SUCTIONS) ×1
TUBE CONNECTING 12X1/4 (SUCTIONS) ×1 IMPLANT
TUBING UROLOGY SET (TUBING) IMPLANT
WATER STERILE IRR 500ML POUR (IV SOLUTION) ×2 IMPLANT

## 2018-05-16 NOTE — Interval H&P Note (Signed)
History and Physical Interval Note:  05/16/2018 10:52 AM  Early [name]  has presented today for surgery, with the diagnosis of BLADDER NEOPLASM  The various methods of treatment have been discussed with the patient and family -- his wife is here with him. After consideration of risks, benefits and other options for treatment, the patient has consented to  Procedure(s): TRANSURETHRAL RESECTION OF BLADDER TUMOR (TURBT)/ RETROGRADE (Bilateral) as a surgical intervention .  The patient's history has been reviewed, patient examined, no change in status, stable for surgery.  I have reviewed the patient's chart and labs. Discussed he may need a foley catheter post-op.  Questions were answered to the patient's satisfaction. He elects to proceed.    Festus Aloe

## 2018-05-16 NOTE — Op Note (Signed)
Preoperative diagnosis: Bladder neoplasm, squamous metaplasia Postoperative diagnosis: Same  Procedure: Cystoscopy, bilateral retrograde pyelogram; TURBT 2 to 5 cm  Surgeon: Junious Silk  Anesthesia: General   Indication for procedure: Brian Cortez is a 67 year old white male with a history of high-grade TA bladder cancer on the left in 2014 and a repeat TUR in 2018 with a necrotic appearing lesion with squamous keratin and necrotic dysplastic squamous cells.  On recent office cystoscopy had a persistent necrotic looking area with a patch of crawling squamous metaplasia on the right trigone.  He was brought today for resection and retrogrades.  Patient has been well without gross hematuria or dysuria.    Findings: On cystoscopy the urethra was slightly narrowed the fossa navicularis and the bulb but the scope dilated these areas.  The trigone contained a growth of tissue on the right side covered and squamous cells which crawled away into a flat patch of squamous metaplasia.  The ureteral orifice ease were in their normal orthotopic position with clear reflux.  He had a large capacity bladder and an elongated prostatic urethra with moderate hyperplasia.  No stone or foreign body in the bladder no other mucosal lesions although the bladder neck was difficult to completely visualize.    Left retrograde pyelogram-this outlined a single ureter single collecting system unit without filling defect, stricture or dilation.  Right retrograde pyelogram-this outlined a single ureter single collecting system unit without filling defect, stricture or dilation.   On exam under anesthesia, the penis was circumcised and normal.  The testicles were descended bilaterally and palpably normal.  The prostate was small and without hard area or nodule on DRE.  Description of procedure: After consent was obtained the patient brought to the operating room.  After adequate anesthesia he was placed in lithotomy position and  prepped and draped in the usual sterile fashion.  A timeout was performed to confirm the patient and procedure.  The cystoscope was passed per urethra and the bladder inspected with a 30 and 70 degree lens.  Even with the 70 degree lens the anterior bladder neck was difficult to completely visualize.  I then replaced the 30 degree lens and the ureters were J-hook because of some prostate enlargement.  I ended up using a cone-tip catheter to occlude the left ureteral orifice and inject contrast and take images with the fluoroscopy unit.  This was repeated on the right side.  Having completed the retrogrades I placed the continuous flow resectoscope sheath with the visual obturator and then passed the handle and loop.  I resected the more growth-like appearing area in the midline and then resected the squamous metaplasia laterally for a total area of about 3 cm.  Hemostasis was excellent and given the nature of the resection and his enlarged prostate I decided to leave a catheter for a couple of days.  A 16 French Foley was passed per urethra and left to gravity drainage with clear urine.  An exam under anesthesia was performed.  He was awakened and taken to the recovery room in stable condition.  Complications: None  Blood loss: Minimal  Specimens to pathology: Right trigone tumor/squamous changes and metaplasia  Drains: 16 Fr foley   Disposition: pt stable to PACU.

## 2018-05-16 NOTE — Anesthesia Procedure Notes (Signed)
Procedure Name: LMA Insertion Date/Time: 05/16/2018 10:56 AM Performed by: Annye Asa, MD Pre-anesthesia Checklist: Patient identified, Emergency Drugs available, Suction available and Patient being monitored Patient Re-evaluated:Patient Re-evaluated prior to induction Oxygen Delivery Method: Circle system utilized Preoxygenation: Pre-oxygenation with 100% oxygen Induction Type: IV induction Ventilation: Mask ventilation without difficulty LMA: LMA inserted LMA Size: 5.0 Number of attempts: 1 Airway Equipment and Method: Bite block Placement Confirmation: positive ETCO2 Tube secured with: Tape Dental Injury: Teeth and Oropharynx as per pre-operative assessment

## 2018-05-16 NOTE — Anesthesia Preprocedure Evaluation (Addendum)
Anesthesia Evaluation  Patient identified by MRN, date of birth, ID band Patient awake    Reviewed: Allergy & Precautions, NPO status , Patient's Chart, lab work & pertinent test results  History of Anesthesia Complications Negative for: history of anesthetic complications  Airway Mallampati: III  TM Distance: >3 FB Neck ROM: Full    Dental  (+) Dental Advisory Given   Pulmonary sleep apnea and Continuous Positive Airway Pressure Ventilation ,    breath sounds clear to auscultation       Cardiovascular hypertension, Pt. on medications (-) angina Rhythm:Regular Rate:Normal  '18 ECHO: EF 55-60%, mild MR (previous history of non-ischemic cardiomyopathy) '14 stress: normal perfusion   Neuro/Psych Anxiety DDD    GI/Hepatic Neg liver ROS, GERD  Medicated and Controlled,  Endo/Other  Morbid obesity  Renal/GU negative Renal ROS   Bladder cancer    Musculoskeletal  (+) Arthritis ,   Abdominal (+) + obese,   Peds  Hematology negative hematology ROS (+)   Anesthesia Other Findings   Reproductive/Obstetrics                            Anesthesia Physical Anesthesia Plan  ASA: III  Anesthesia Plan: General   Post-op Pain Management:    Induction: Intravenous  PONV Risk Score and Plan: 2 and Ondansetron and Dexamethasone  Airway Management Planned: LMA  Additional Equipment:   Intra-op Plan:   Post-operative Plan:   Informed Consent: I have reviewed the patients History and Physical, chart, labs and discussed the procedure including the risks, benefits and alternatives for the proposed anesthesia with the patient or authorized representative who has indicated his/her understanding and acceptance.   Dental advisory given  Plan Discussed with: CRNA and Surgeon  Anesthesia Plan Comments: (Plan routine monitors, GA- LMA OK)        Anesthesia Quick Evaluation

## 2018-05-16 NOTE — Transfer of Care (Signed)
  Last Vitals:  Vitals Value Taken Time  BP 134/101 05/16/2018 11:50 AM  Temp    Pulse 66 05/16/2018 11:52 AM  Resp    SpO2 95 % 05/16/2018 11:52 AM  Vitals shown include unvalidated device data.  Last Pain:  Vitals:   05/16/18 0936  TempSrc:   PainSc: 0-No pain      Patients Stated Pain Goal: 6 (05/16/18 0936)  Immediate Anesthesia Transfer of Care Note  Patient: Brian Cortez  Procedure(s) Performed: Procedure(s) (LRB): TRANSURETHRAL RESECTION OF BLADDER TUMOR (TURBT)/ RETROGRADE (Bilateral)  Patient Location: PACU  Anesthesia Type: General  Level of Consciousness: awake, alert  and oriented  Airway & Oxygen Therapy: Patient Spontanous Breathing and Patient connected to nasal cannula oxygen  Post-op Assessment: Report given to PACU RN and Post -op Vital signs reviewed and stable  Post vital signs: Reviewed and stable  Complications: No apparent anesthesia complications

## 2018-05-16 NOTE — Anesthesia Postprocedure Evaluation (Signed)
Anesthesia Post Note  Patient: Brian Cortez  Procedure(s) Performed: TRANSURETHRAL RESECTION OF BLADDER TUMOR (TURBT)/ RETROGRADE (Bilateral )     Patient location during evaluation: PACU Anesthesia Type: General Level of consciousness: awake and alert, oriented and patient cooperative Pain management: pain level controlled Vital Signs Assessment: post-procedure vital signs reviewed and stable Respiratory status: spontaneous breathing, nonlabored ventilation and respiratory function stable Cardiovascular status: blood pressure returned to baseline and stable Postop Assessment: no apparent nausea or vomiting Anesthetic complications: no    Last Vitals:  Vitals:   05/16/18 1215 05/16/18 1230  BP: (!) 132/94 (!) 142/91  Pulse: 62 (!) 58  Resp: 13 11  Temp:    SpO2: 93% 93%    Last Pain:  Vitals:   05/16/18 1230  TempSrc:   PainSc: 0-No pain                 Kele Barthelemy,E. Nishita Isaacks

## 2018-05-16 NOTE — Discharge Instructions (Signed)
Indwelling Urinary Catheter Care, Adult Take good care of your catheter to keep it working and to prevent problems. How to wear your catheter Attach your catheter to your leg with tape (adhesive tape) or a leg strap. Make sure it is not too tight. If you use tape, remove any bits of tape that are already on the catheter.  Remove the foley in 48 hours, on Thursday morning.   How to wear a drainage bag You should have:  A large overnight bag.  A small leg bag.  Overnight Bag You may wear the overnight bag at any time. Always keep the bag below the level of your bladder but off the floor. When you sleep, put a clean plastic bag in a wastebasket. Then hang the bag inside the wastebasket. Leg Bag Never wear the leg bag at night. Always wear the leg bag below your knee. Keep the leg bag secure with a leg strap or tape. How to care for your skin  Clean the skin around the catheter at least once every day.  Shower every day. Do not take baths.  Put creams, lotions, or ointments on your genital area only as told by your doctor.  Do not use powders, sprays, or lotions on your genital area. How to clean your catheter and your skin 1. Wash your hands with soap and water. 2. Wet a washcloth in warm water and gentle (mild) soap. 3. Use the washcloth to clean the skin where the catheter enters your body. Clean downward and wipe away from the catheter in small circles. Do not wipe toward the catheter. 4. Pat the area dry with a clean towel. Make sure to clean off all soap. How to care for your drainage bags Empty your drainage bag when it is ?- full or at least 2-3 times a day. Replace your drainage bag once a month or sooner if it starts to smell bad or look dirty. Do not clean your drainage bag unless told by your doctor. Emptying a drainage bag  Supplies Needed  Rubbing alcohol.  Gauze pad or cotton ball.  Tape or a leg strap.  Steps 1. Wash your hands with soap and  water. 2. Separate (detach) the bag from your leg. 3. Hold the bag over the toilet or a clean container. Keep the bag below your hips and bladder. This stops pee (urine) from going back into the tube. 4. Open the pour spout at the bottom of the bag. 5. Empty the pee into the toilet or container. Do not let the pour spout touch any surface. 6. Put rubbing alcohol on a gauze pad or cotton ball. 7. Use the gauze pad or cotton ball to clean the pour spout. 8. Close the pour spout. 9. Attach the bag to your leg with tape or a leg strap. 10. Wash your hands.  Changing a drainage bag Supplies Needed  Alcohol wipes.  A clean drainage bag.  Adhesive tape or a leg strap.  Steps 1. Wash your hands with soap and water. 2. Separate the dirty bag from your leg. 3. Pinch the rubber catheter with your fingers so that pee does not spill out. 4. Separate the catheter tube from the drainage tube where these tubes connect (at the connection valve). Do not let the tubes touch any surface. 5. Clean the end of the catheter tube with an alcohol wipe. Use a different alcohol wipe to clean the end of the drainage tube. 6. Connect the catheter tube to  the drainage tube of the clean bag. 7. Attach the new bag to the leg with adhesive tape or a leg strap. 8. Wash your hands.  How to prevent infection and other problems  Never pull on your catheter or try to remove it. Pulling can damage tissue in your body.  Always wash your hands before and after touching your catheter.  If a leg strap gets wet, replace it with a dry one.  Drink enough fluids to keep your pee clear or pale yellow, or as told by your doctor.  Do not let the drainage bag or tubing touch the floor.  Wear cotton underwear.  If you are male, wipe from front to back after you poop (have a bowel movement).  Check on the catheter often to make sure it works and the tubing is not twisted. Get help if:  Your pee is cloudy.  Your pee  smells unusually bad.  Your pee is not draining into the bag.  Your tube gets clogged.  Your catheter starts to leak.  Your bladder feels full. Get help right away if:  You have redness, swelling, or pain where the catheter enters your body.  You have fluid, pus, or a bad smell coming from the area where the catheter enters your body.  The area where the catheter enters your body feels warm.  You have a fever.  You have pain in your: ? Stomach (abdomen). ? Legs. ? Lower back. ? Bladder.  You see blood fill the catheter.  Your pee is pink or red.  You feel sick to your stomach (nauseous).  You throw up (vomit).  You have chills.  Your catheter gets pulled out. This information is not intended to replace advice given to you by your health care provider. Make sure you discuss any questions you have with your health care provider. Document Released: 03/05/2013 Document Revised: 10/06/2016 Document Reviewed: 04/23/2014 Elsevier Interactive Patient Education  2018 [employer].   Cystoscopy, Care After Refer to this sheet in the next few weeks. These instructions provide you with information about caring for yourself after your procedure. Your health care provider may also give you more specific instructions. Your treatment has been planned according to current medical practices, but problems sometimes occur. Call your health care provider if you have any problems or questions after your procedure. What can I expect after the procedure? After the procedure, it is common to have:  Mild pain when you urinate. Pain should stop within a few minutes after you urinate. This may last for up to 1 week.  A small amount of blood in your urine for several days.  Feeling like you need to urinate but producing only a small amount of urine.  Follow these instructions at home:  Medicines  Take over-the-counter and prescription medicines only as told by your health care  provider.  If you were prescribed an antibiotic medicine, take it as told by your health care provider. Do not stop taking the antibiotic even if you start to feel better. General instructions   Return to your normal activities as told by your health care provider. Ask your health care provider what activities are safe for you.  Do not drive for 24 hours if you received a sedative.  Watch for any blood in your urine. If the amount of blood in your urine increases, call your health care provider.  Follow instructions from your health care provider about eating or drinking restrictions.  If a tissue  sample was removed for testing (biopsy) during your procedure, it is your responsibility to get your test results. Ask your health care provider or the department performing the test when your results will be ready.  Drink enough fluid to keep your urine clear or pale yellow.  Keep all follow-up visits as told by your health care provider. This is important. Contact a health care provider if:  You have pain that gets worse or does not get better with medicine, especially pain when you urinate.  You have difficulty urinating. Get help right away if:  You have more blood in your urine.  You have blood clots in your urine.  You have abdominal pain.  You have a fever or chills.  You are unable to urinate. This information is not intended to replace advice given to you by your health care provider. Make sure you discuss any questions you have with your health care provider. Document Released: 05/28/2005 Document Revised: 04/15/2016 Document Reviewed: 09/25/2015 Elsevier Interactive Patient Education  2018 [facility] Instructions  Activity: Get plenty of rest for the remainder of the day. A responsible individual must stay with you for 24 hours following the procedure.  For the next 24 hours, DO NOT: -Drive a car -Paediatric nurse -Drink alcoholic  beverages -Take any medication unless instructed by your physician -Make any legal decisions or sign important papers.  Meals: Start with liquid foods such as gelatin or soup. Progress to regular foods as tolerated. Avoid greasy, spicy, heavy foods. If nausea and/or vomiting occur, drink only clear liquids until the nausea and/or vomiting subsides. Call your physician if vomiting continues.  Special Instructions/Symptoms: Your throat may feel dry or sore from the anesthesia or the breathing tube placed in your throat during surgery. If this causes discomfort, gargle with warm salt water. The discomfort should disappear within 24 hours.  If you had a scopolamine patch placed behind your ear for the management of post- operative nausea and/or vomiting:  1. The medication in the patch is effective for 72 hours, after which it should be removed.  Wrap patch in a tissue and discard in the trash. Wash hands thoroughly with soap and water. 2. You may remove the patch earlier than 72 hours if you experience unpleasant side effects which may include dry mouth, dizziness or visual disturbances. 3. Avoid touching the patch. Wash your hands with soap and water after contact with the patch.

## 2018-05-17 ENCOUNTER — Encounter (HOSPITAL_BASED_OUTPATIENT_CLINIC_OR_DEPARTMENT_OTHER): Payer: Self-pay | Admitting: Urology

## 2018-05-17 DIAGNOSIS — E291 Testicular hypofunction: Secondary | ICD-10-CM | POA: Diagnosis not present

## 2018-06-19 ENCOUNTER — Other Ambulatory Visit: Payer: Self-pay | Admitting: Internal Medicine

## 2018-06-19 DIAGNOSIS — H401132 Primary open-angle glaucoma, bilateral, moderate stage: Secondary | ICD-10-CM | POA: Diagnosis not present

## 2018-06-21 ENCOUNTER — Encounter: Payer: Self-pay | Admitting: Cardiology

## 2018-06-21 ENCOUNTER — Ambulatory Visit: Payer: PPO | Admitting: Cardiology

## 2018-06-21 VITALS — BP 120/82 | HR 59 | Ht 76.0 in | Wt 265.0 lb

## 2018-06-21 DIAGNOSIS — E782 Mixed hyperlipidemia: Secondary | ICD-10-CM | POA: Diagnosis not present

## 2018-06-21 DIAGNOSIS — I10 Essential (primary) hypertension: Secondary | ICD-10-CM | POA: Diagnosis not present

## 2018-06-21 DIAGNOSIS — I420 Dilated cardiomyopathy: Secondary | ICD-10-CM | POA: Diagnosis not present

## 2018-06-21 DIAGNOSIS — I2510 Atherosclerotic heart disease of native coronary artery without angina pectoris: Secondary | ICD-10-CM

## 2018-06-21 NOTE — Progress Notes (Signed)
Cardiology Office Note:    Date:  06/21/2018   ID:  Early [name], DOB June 22, 1951, MRN [id number]  PCP:  Prince Solian, MD  Cardiologist:  No primary care provider on file.    Referring MD: Prince Solian, MD   Chief Complaint  Patient presents with  . Follow-up    Coronary artery calcifications, dilated cardiomyopathy, hypertension    History of Present Illness:    Brian Cortez is a 67 y.o. male with a hx of hyperlipidemia, HTN, DCM with EF 45-50% with mild global hypokinesis, coronary artery calcification on a chest CT with normal nuclear stress test and EF was 63% on the Cardiolite (contrasting with the echo). He also has a history of bladder cancer and had resection and BCG treatment.   he is here today for followup and is doing well.  He denies any chest pain or pressure, SOB, DOE, PND, orthopnea, LE edema, dizziness, palpitations or syncope. He is compliant with his meds and is tolerating meds with no SE.    Past Medical History:  Diagnosis Date  . Anxiety   . Bladder tumor   . Carpal tunnel syndrome, left    intermittant  . Chronic seasonal allergic rhinitis   . Coronary artery disease    per Dr Aundra Dubin (cardiologist) note --- noted on CT chest coronary calcification -- ETT-Cardiolite normal in 2014  . DDD (degenerative disc disease), cervical   . Dilated cardiomyopathy ([place])   . Diverticulosis of colon   . Fecal incontinence   . GERD (gastroesophageal reflux disease)   . Grade II diastolic dysfunction 26/71/2458   per ECHO   . Hemorrhoids   . Hiatal hernia   . History of adenomatous polyp of colon    2001;  2007;  2012  . History of bladder cancer urologist-  dr Gaynelle Arabian   11-06-2013  s/p TURBT and BCG tx's  . History of kidney stones    about 45 years ago  . History of panic attacks   . History of urethral stricture   . Hyperlipidemia   . Hypertension   . Hypogonadism in male   . LVH (left ventricular hypertrophy) 06/17/2017   Mild, noted  on ECHO  . Nonischemic cardiomyopathy [facility])    cardiologist-  dr Aundra Dubin-- per last echo 01/ 2016 ef 50-55%  (up from 09/ 2014 ef 45-50%)  . OSA on CPAP    since 2014  . Pneumonia 11/2010   Left lower lobe  . Pulmonary hypertension ([place]) 06/17/2017   Mild, per ECHO    Past Surgical History:  Procedure Laterality Date  . CARDIOVASCULAR STRESS TEST  08-21-2013  dr Aundra Dubin   normal nuclear study w/ no ischemia/  normal LV function and wall motion , ef 63%  . CATARACT EXTRACTION W/ INTRAOCULAR LENS  IMPLANT, BILATERAL  2013  . COLONOSCOPY  last one 03-03-2016  . CYSTO/  DIRECT VISION INTERNAL URETEROTOMY  11/25/2006  . LAPAROSCOPIC CHOLECYSTECTOMY  10/15/2006  . ORBITAL FRACTURE SURGERY  1986   left eye:  Jan 1986--- fixation of fractures, repair of optic nerve decompression , and complex closure  . TONSILLECTOMY  child  . TRANSTHORACIC ECHOCARDIOGRAM  12-16-2014   dr Aundra Dubin   grade 1 diastolic dysfunction, ef 09-98% (improved from last study in 2014 , previous ef 45-50%) /  trivial MR and TR/  mild LAE/  inferior vena cava dilated, respirophasic diameter changes were blunted (<50%), consistant w/ elevated central venous pressure  . TRANSURETHRAL RESECTION OF BLADDER TUMOR Bilateral  05/16/2018   Procedure: TRANSURETHRAL RESECTION OF BLADDER TUMOR (TURBT)/ RETROGRADE;  Surgeon: Festus Aloe, MD;  Location: [facility];  Service: Urology;  Laterality: Bilateral;  . TRANSURETHRAL RESECTION OF BLADDER TUMOR WITH GYRUS (TURBT-GYRUS) N/A 11/06/2013   Procedure: TRANSURETHRAL RESECTION OF BLADDER TUMOR WITH GYRUS (TURBT-GYRUS);  Surgeon: Ailene Rud, MD;  Location: [facility];  Service: Urology;  Laterality: N/A;  . TRANSURETHRAL RESECTION OF BLADDER TUMOR WITH MITOMYCIN-C N/A 12/09/2016   Procedure: CYSTOSCOPY TRANSURETHRAL RESECTION OF BLADDER TUMOR WITH MITOMYCIN-C;  Surgeon: Carolan Clines, MD;  Location: [facility];  Service: Urology;  Laterality: N/A;    . VARICOSE VEIN SURGERY  2002  . VASECTOMY      Current Medications: Current Meds  Medication Sig  . aspirin 81 MG tablet Take 81 mg by mouth every evening.   Marland Kitchen atorvastatin (LIPITOR) 40 MG tablet Take 40 mg by mouth every evening.   . bimatoprost (LUMIGAN) 0.03 % ophthalmic solution 1 drop at bedtime.  Marland Kitchen CIALIS 20 MG tablet Take 20 mg by mouth daily as needed for erectile dysfunction.   . DOCOSAHEXAENOIC ACID PO Take 1 tablet by mouth 2 (two) times daily.  Marland Kitchen escitalopram (LEXAPRO) 20 MG tablet Take 20 mg by mouth every morning.   . fluticasone (FLONASE) 50 MCG/ACT nasal spray FLUTICASONE PROPIONATE 50 MCG/ACT SUSP---  bid prn  . losartan (COZAAR) 25 MG tablet   . LYCOPENE PO Take 1 tablet by mouth daily.  . Multiple Vitamins-Minerals (CENTRUM SILVER PO) Take 1 tablet by mouth every morning.   . Multiple Vitamins-Minerals (PRESERVISION AREDS 2 PO) Take 1 capsule by mouth 2 (two) times daily.   . Omega-3 Fatty Acids (FISH OIL OMEGA-3 PO) Take by mouth.  . pantoprazole (PROTONIX) 40 MG tablet TAKE ONE TABLET DAILY  . testosterone cypionate (DEPOTESTOTERONE CYPIONATE) 100 MG/ML injection Inject 300 mg into the muscle every 14 (fourteen) days. For IM use only  . timolol (BETIMOL) 0.5 % ophthalmic solution Place 1 drop into the right eye daily.  . timolol (TIMOPTIC) 0.5 % ophthalmic solution Place 1 drop into the right eye daily.     Allergies:   Pollen extract   Social History   Socioeconomic History  . Marital status: Married    Spouse name: Pamala Hurry  . Number of children: 2  . Years of education: Post Grad  . Highest education level: Not on file  Occupational History  . Occupation: BANK -PRESIDENT    Employer: [employer]  . Occupation: PRESIDENT    Employer: [employer]  Social Needs  . Financial resource strain: Not on file  . Food insecurity:    Worry: Not on file    Inability: Not on file  . Transportation needs:    Medical: Not on file    Non-medical: Not on  file  Tobacco Use  . Smoking status: Never Cortez  . Smokeless tobacco: Never Used  Substance and Sexual Activity  . Alcohol use: Yes    Alcohol/week: 0.0 oz    Comment: occasional  . Drug use: No  . Sexual activity: Not on file    Comment: VASECTOMY  Lifestyle  . Physical activity:    Days per week: Not on file    Minutes per session: Not on file  . Stress: Not on file  Relationships  . Social connections:    Talks on phone: Not on file    Gets together: Not on file    Attends religious service: Not on file  Active member of club or organization: Not on file    Attends meetings of clubs or organizations: Not on file    Relationship status: Not on file  Other Topics Concern  . Not on file  Social History Narrative   Patient lives at home with spouse.    Caffeine Use: 3-4 cups     Family History: The patient's family history includes Heart attack in his paternal grandfather and paternal uncle; Heart attack (age of onset: 59) in his father; Heart attack (age of onset: 21) in his maternal uncle; Prostate cancer in his paternal uncle; Stroke (age of onset: 101) in his father. There is no history of Colon cancer or Diabetes.  ROS:   Please see the history of present illness.    ROS  All other systems reviewed and negative.   EKGs/Labs/Other Studies Reviewed:    The following studies were reviewed today: none  EKG:  EKG is  ordered today.  The ekg ordered today demonstrates sinus bradycardia 59 bpm with incomplete right bundle branch block no ST-T wave of normality.  Recent Labs: 05/16/2018: Hemoglobin 17.0; Potassium 4.2; Sodium 140   Recent Lipid Panel    Component Value Date/Time   CHOL 130 09/11/2014 0738   TRIG 285.0 (H) 09/11/2014 0738   HDL 34.40 (L) 09/11/2014 0738   CHOLHDL 4 09/11/2014 0738   VLDL 57.0 (H) 09/11/2014 0738   LDLCALC 42 10/16/2013 0823   LDLDIRECT 67.7 09/11/2014 0738    Physical Exam:    VS:  BP 120/82   Pulse (!) 59   Ht 6\' 4"   (1.93 m)   Wt 265 lb (120.2 kg)   BMI 32.26 kg/m     Wt Readings from Last 3 Encounters:  06/21/18 265 lb (120.2 kg)  05/16/18 271 lb 3.2 oz (123 kg)  09/14/17 257 lb (116.6 kg)     GEN:  Well nourished, well developed in no acute distress HEENT: Normal NECK: No JVD; No carotid bruits LYMPHATICS: No lymphadenopathy CARDIAC: RRR, no murmurs, rubs, gallops RESPIRATORY:  Clear to auscultation without rales, wheezing or rhonchi  ABDOMEN: Soft, non-tender, non-distended MUSCULOSKELETAL:  No edema; No deformity  SKIN: Warm and dry NEUROLOGIC:  Alert and oriented x 3 PSYCHIATRIC:  Normal affect   ASSESSMENT:    1. Coronary artery calcification seen on CAT scan   2. Dilated cardiomyopathy ([place])   3. Essential hypertension   4. HYPERLIPIDEMIA    PLAN:    In order of problems listed above:  1.  Coronary artery calcifications noted on chest CT - nuclear stress test showed no inducible ischemia.  He denies any anginal symptoms.  He will continue on aspirin 81 mg daily and statin.  2.  Dilated cardiomyopathy -2D echo showed EF 50 to 55% but EF was normal nuclear stress test.  He appears euvolemic on exam.  His weight is stable.  3.  Hypertension - pressures well controlled on exam today.  He will continue on losartan 25 mg daily.  Creatinine was stable  at 1.3 on 12/23/2017.  4.  Hyperlipidemia -we will goal is less than 70.  He will continue on Lipitor 40 mg daily.  LDL was 42 on 12/23/2017 and ALT was 38.   Medication Adjustments/Labs and Tests Ordered: Current medicines are reviewed at length with the patient today.  Concerns regarding medicines are outlined above.  No orders of the defined types were placed in this encounter.  No orders of the defined types were placed in this encounter.  Signed, Fransico Him, MD  06/21/2018 8:48 AM    [place]

## 2018-06-21 NOTE — Patient Instructions (Signed)

## 2018-06-28 DIAGNOSIS — G4730 Sleep apnea, unspecified: Secondary | ICD-10-CM | POA: Diagnosis not present

## 2018-06-28 DIAGNOSIS — R7301 Impaired fasting glucose: Secondary | ICD-10-CM | POA: Diagnosis not present

## 2018-06-28 DIAGNOSIS — C679 Malignant neoplasm of bladder, unspecified: Secondary | ICD-10-CM | POA: Diagnosis not present

## 2018-06-28 DIAGNOSIS — I10 Essential (primary) hypertension: Secondary | ICD-10-CM | POA: Diagnosis not present

## 2018-06-28 DIAGNOSIS — E298 Other testicular dysfunction: Secondary | ICD-10-CM | POA: Diagnosis not present

## 2018-06-28 DIAGNOSIS — I5189 Other ill-defined heart diseases: Secondary | ICD-10-CM | POA: Diagnosis not present

## 2018-06-28 DIAGNOSIS — Z6832 Body mass index (BMI) 32.0-32.9, adult: Secondary | ICD-10-CM | POA: Diagnosis not present

## 2018-07-03 DIAGNOSIS — C672 Malignant neoplasm of lateral wall of bladder: Secondary | ICD-10-CM | POA: Diagnosis not present

## 2018-07-03 DIAGNOSIS — E291 Testicular hypofunction: Secondary | ICD-10-CM | POA: Diagnosis not present

## 2018-07-05 ENCOUNTER — Other Ambulatory Visit: Payer: Self-pay

## 2018-07-05 ENCOUNTER — Ambulatory Visit (HOSPITAL_COMMUNITY): Payer: PPO | Attending: Cardiology

## 2018-07-05 DIAGNOSIS — I110 Hypertensive heart disease with heart failure: Secondary | ICD-10-CM | POA: Insufficient documentation

## 2018-07-05 DIAGNOSIS — I272 Pulmonary hypertension, unspecified: Secondary | ICD-10-CM | POA: Diagnosis not present

## 2018-07-05 DIAGNOSIS — I509 Heart failure, unspecified: Secondary | ICD-10-CM | POA: Insufficient documentation

## 2018-07-05 DIAGNOSIS — G4733 Obstructive sleep apnea (adult) (pediatric): Secondary | ICD-10-CM | POA: Insufficient documentation

## 2018-07-05 DIAGNOSIS — E785 Hyperlipidemia, unspecified: Secondary | ICD-10-CM | POA: Insufficient documentation

## 2018-07-05 DIAGNOSIS — I2510 Atherosclerotic heart disease of native coronary artery without angina pectoris: Secondary | ICD-10-CM | POA: Diagnosis not present

## 2018-07-21 DIAGNOSIS — E291 Testicular hypofunction: Secondary | ICD-10-CM | POA: Diagnosis not present

## 2018-07-21 DIAGNOSIS — C672 Malignant neoplasm of lateral wall of bladder: Secondary | ICD-10-CM | POA: Diagnosis not present

## 2018-08-04 DIAGNOSIS — R7989 Other specified abnormal findings of blood chemistry: Secondary | ICD-10-CM | POA: Insufficient documentation

## 2018-08-04 DIAGNOSIS — E6609 Other obesity due to excess calories: Secondary | ICD-10-CM | POA: Diagnosis not present

## 2018-08-04 DIAGNOSIS — N521 Erectile dysfunction due to diseases classified elsewhere: Secondary | ICD-10-CM | POA: Diagnosis not present

## 2018-08-04 DIAGNOSIS — N3289 Other specified disorders of bladder: Secondary | ICD-10-CM | POA: Diagnosis not present

## 2018-08-04 DIAGNOSIS — N35914 Unspecified anterior urethral stricture, male: Secondary | ICD-10-CM | POA: Diagnosis not present

## 2018-08-04 DIAGNOSIS — N323 Diverticulum of bladder: Secondary | ICD-10-CM | POA: Insufficient documentation

## 2018-08-04 DIAGNOSIS — Z6832 Body mass index (BMI) 32.0-32.9, adult: Secondary | ICD-10-CM | POA: Diagnosis not present

## 2018-08-08 DIAGNOSIS — D090 Carcinoma in situ of bladder: Secondary | ICD-10-CM | POA: Diagnosis not present

## 2018-08-08 DIAGNOSIS — Z9989 Dependence on other enabling machines and devices: Secondary | ICD-10-CM | POA: Diagnosis not present

## 2018-08-08 DIAGNOSIS — I10 Essential (primary) hypertension: Secondary | ICD-10-CM | POA: Diagnosis not present

## 2018-08-08 DIAGNOSIS — G4733 Obstructive sleep apnea (adult) (pediatric): Secondary | ICD-10-CM | POA: Diagnosis not present

## 2018-08-08 DIAGNOSIS — I2510 Atherosclerotic heart disease of native coronary artery without angina pectoris: Secondary | ICD-10-CM | POA: Diagnosis not present

## 2018-08-08 DIAGNOSIS — I420 Dilated cardiomyopathy: Secondary | ICD-10-CM | POA: Diagnosis not present

## 2018-08-08 DIAGNOSIS — C672 Malignant neoplasm of lateral wall of bladder: Secondary | ICD-10-CM | POA: Diagnosis not present

## 2018-08-29 DIAGNOSIS — G4733 Obstructive sleep apnea (adult) (pediatric): Secondary | ICD-10-CM | POA: Diagnosis not present

## 2018-08-29 DIAGNOSIS — C672 Malignant neoplasm of lateral wall of bladder: Secondary | ICD-10-CM | POA: Diagnosis not present

## 2018-09-05 DIAGNOSIS — C672 Malignant neoplasm of lateral wall of bladder: Secondary | ICD-10-CM | POA: Diagnosis not present

## 2018-09-07 ENCOUNTER — Encounter: Payer: Self-pay | Admitting: Neurology

## 2018-09-12 DIAGNOSIS — C672 Malignant neoplasm of lateral wall of bladder: Secondary | ICD-10-CM | POA: Diagnosis not present

## 2018-09-19 DIAGNOSIS — C672 Malignant neoplasm of lateral wall of bladder: Secondary | ICD-10-CM | POA: Diagnosis not present

## 2018-09-20 ENCOUNTER — Encounter: Payer: Self-pay | Admitting: Neurology

## 2018-09-20 ENCOUNTER — Ambulatory Visit (INDEPENDENT_AMBULATORY_CARE_PROVIDER_SITE_OTHER): Payer: PPO | Admitting: Neurology

## 2018-09-20 VITALS — BP 121/77 | HR 68 | Ht 76.0 in | Wt 269.0 lb

## 2018-09-20 DIAGNOSIS — Z9989 Dependence on other enabling machines and devices: Secondary | ICD-10-CM

## 2018-09-20 DIAGNOSIS — Z5189 Encounter for other specified aftercare: Secondary | ICD-10-CM

## 2018-09-20 DIAGNOSIS — G4733 Obstructive sleep apnea (adult) (pediatric): Secondary | ICD-10-CM

## 2018-09-20 DIAGNOSIS — R530 Neoplastic (malignant) related fatigue: Secondary | ICD-10-CM | POA: Insufficient documentation

## 2018-09-20 NOTE — Progress Notes (Signed)
Order for cpap supplies sent to [facility] via community message via [employer]. Confirmation received that the order transmitted was successful.

## 2018-09-20 NOTE — Progress Notes (Signed)
[facility]  Provider:  Larey Seat, M D  Referring Provider: Prince Solian, MD Primary Care Physician:  Prince Solian, MD  Chief Complaint  Patient presents with  . Follow-up    Yearly CIPAP follow up,sleep report in room pt in room 11    HPI:  Brian Cortez is a 67 y.o. male seen here as a revisit on 09-20-2018, At the pleasure of meeting today with Early [name], and meanwhile retired 67 year old Caucasian married male patient who has been followed here for CPAP needs since 2013 of 14.  He continues to be highly compliant CPAP patient, his average use of time of 7 hours 7 minutes for this 30-day.  And his compliance was 87%, residual AHi at 1.5/h. Uses an S9 Autoset , set at 8 cm , 2 cm EPR.  He was not able to use his CPAP on 5 days due to illness, he has learned of a return of his bladder cancer by cystoscopy- Biopsy Dr. Lawerance Bach - and needed additional work-up and initiation of chemotherapy.    09-14-2017,Brian Cortez is seen here today for a yearly revisit. He has been very compliant CPAP user with 5% compliance averaging 8 hours and 23 minutes at night set pressure is 8 cm water with 27 cm EPR, residual AHI is 0.8 he does not have major air leaks in the low residual AHI is equally distributed between central and obstructive events. He has lost weight visibly. He endorsed the Epworth sleepiness score at 2 points the fatigue severity score at 11 points the geriatric depression score at 1 out of 15 points. He is retired, he travels and enjoys golf. He is taking Administrator.     Fatigue and lightheadedness were improved, until last month. He has no longer nocturia since being on CPAP. In 2016 he went to the ED and was diagnosed with diastolic heart failure, had a nuclear test with Dr. Loralie Champagne - improved on Coreg.  His medical history has changed drastically over the last 12 month, he suffered a congestive heart failure episode and has been  controlled by Coreg.  His bladder cancer was discovered in Dec 2014. Underwent BCG treatment. Has a 30% re-occurrence risk. He had pneumonia and changed PCPs.  The data collection began on 07-28-14 and ended on 08-26-14 CPAP is used at 8 cm water pressure with 2 cm EPR the residual AHI is 1.6 which is excellent,  the patient uses a machine on average 8 hours and 24 minutes at night and has 100% compliance for nightly use  over 4 hours. Median daily usage 8 hours 11 minutes.  Interval history history from 09-09-15, the patient's 100% compliant for the number of 30 days and each of these days over 4 hours of consecutive use has been registered. On average he uses his machine for 8 hours and 47 minutes set pressure is 8 cm water with 2 cm expiratory pressure relief ( EPR) the residual AHI is 1.4 he has minimal air leaks, his Epworth sleepiness score today is 1. fatigue severity 6 points and the geriatric depression score is endorsed at one point.  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 When I saw him last year he had been suffering from pneumonia and his primary care physician, Dr. Elsworth Soho, had treated him. The patient is a bladder cancer survivor and has been diagnosed with diastolic heart failure. His cardiac function had improved on  Coreg. I have followed him for obstructive sleep apnea and he is a CPAP compliant patient with 100% of use of his CPAP at average use of 8 hours and 54 minutes nightly. CPAP is set at 8 cm water pressure this 2 cm EPR and a residual AHI of 1.4. Minor air leaks are noted, these have no clinical bearing.    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  Sleeping better, SOB today,  coughing. has ciliary injection, hay fever.  Severe  allergic rhinitis. Obese. Has not had recurrence of Nocturia. Hot flushes after d/c testosterone supplements     He is currently on his fourth of 6 chemotherapy rounds.  Bladder cancer has returned, this has affected his fatigue which was endorsed at 35 out of 63 possible points, but he has not had excessive daytime sleepiness and endorsed only 2 out of 24 points.  He also is not clinically depressed geriatric depression score endorsed at 2 out of 15 points.   Social History   Socioeconomic History  . Marital status: Married    Spouse name: Pamala Hurry  . Number of children: 2  . Years of education: Post Grad  . Highest education level: Not on file  Occupational History  . Occupation: BANK -PRESIDENT    Employer: [employer]  . Occupation: PRESIDENT    Employer: [employer]  Social Needs  . Financial resource strain: Not on file  . Food insecurity:    Worry: Not on file    Inability: Not on file  . Transportation needs:    Medical: Not on file    Non-medical: Not on file  Tobacco Use  . Smoking status: Never Smoker  . Smokeless tobacco: Never Used  Substance and Sexual Activity  . Alcohol use: Yes    Alcohol/week: 0.0 standard drinks    Comment: occasional  . Drug use: No  . Sexual activity: Not on file    Comment: VASECTOMY  Lifestyle  . Physical activity:    Days per week: Not on file    Minutes per session: Not on file  . Stress: Not on file  Relationships  . Social connections:    Talks on phone: Not on file    Gets together: Not on file    Attends religious service: Not on file    Active member of club or organization: Not on file    Attends meetings of clubs or organizations: Not on file    Relationship status: Not on file  . Intimate partner violence:    Fear of current or ex partner: Not on file    Emotionally abused: Not on file    Physically abused: Not on file    Forced sexual activity: Not on file   Other Topics Concern  . Not on file  Social History Narrative   Patient lives at home with spouse.    Caffeine Use: 3-4 cups    Family History  Problem Relation Age of Onset  . Heart attack Father 21  . Stroke Father 37  . Heart attack Paternal Grandfather        in 54s  . Prostate cancer Paternal Uncle   . Heart attack Paternal Uncle         > 55  .  Heart attack Maternal Uncle 80       > 55  . Colon cancer Neg Hx   . Diabetes Neg Hx     Past Medical History:  Diagnosis Date  . Anxiety   . Bladder tumor   . Carpal tunnel syndrome, left    intermittant  . Chronic seasonal allergic rhinitis   . Coronary artery disease    per Dr Aundra Dubin (cardiologist) note --- noted on CT chest coronary calcification -- ETT-Cardiolite normal in 2014  . DDD (degenerative disc disease), cervical   . Dilated cardiomyopathy ([place])   . Diverticulosis of colon   . Fecal incontinence   . GERD (gastroesophageal reflux disease)   . Grade II diastolic dysfunction 85/63/1497   per ECHO   . Hemorrhoids   . Hiatal hernia   . History of adenomatous polyp of colon    2001;  2007;  2012  . History of bladder cancer urologist-  dr Gaynelle Arabian   11-06-2013  s/p TURBT and BCG tx's  . History of kidney stones    about 45 years ago  . History of panic attacks   . History of urethral stricture   . Hyperlipidemia   . Hypertension   . Hypogonadism in male   . LVH (left ventricular hypertrophy) 06/17/2017   Mild, noted on ECHO  . Nonischemic cardiomyopathy [facility])    cardiologist-  dr Aundra Dubin-- per last echo 01/ 2016 ef 50-55%  (up from 09/ 2014 ef 45-50%)  . OSA on CPAP    since 2014  . Pneumonia 11/2010   Left lower lobe  . Pulmonary hypertension ([place]) 06/17/2017   Mild, per ECHO    Past Surgical History:  Procedure Laterality Date  . CARDIOVASCULAR STRESS TEST  08-21-2013  dr Aundra Dubin   normal nuclear study w/ no ischemia/  normal LV function and wall motion , ef 63%  . CATARACT EXTRACTION W/  INTRAOCULAR LENS  IMPLANT, BILATERAL  2013  . COLONOSCOPY  last one 03-03-2016  . CYSTO/  DIRECT VISION INTERNAL URETEROTOMY  11/25/2006  . LAPAROSCOPIC CHOLECYSTECTOMY  10/15/2006  . ORBITAL FRACTURE SURGERY  1986   left eye:  Jan 1986--- fixation of fractures, repair of optic nerve decompression , and complex closure  . TONSILLECTOMY  child  . TRANSTHORACIC ECHOCARDIOGRAM  12-16-2014   dr Aundra Dubin   grade 1 diastolic dysfunction, ef 02-63% (improved from last study in 2014 , previous ef 45-50%) /  trivial MR and TR/  mild LAE/  inferior vena cava dilated, respirophasic diameter changes were blunted (<50%), consistant w/ elevated central venous pressure  . TRANSURETHRAL RESECTION OF BLADDER TUMOR Bilateral 05/16/2018   Procedure: TRANSURETHRAL RESECTION OF BLADDER TUMOR (TURBT)/ RETROGRADE;  Surgeon: Festus Aloe, MD;  Location: [facility];  Service: Urology;  Laterality: Bilateral;  . TRANSURETHRAL RESECTION OF BLADDER TUMOR WITH GYRUS (TURBT-GYRUS) N/A 11/06/2013   Procedure: TRANSURETHRAL RESECTION OF BLADDER TUMOR WITH GYRUS (TURBT-GYRUS);  Surgeon: Ailene Rud, MD;  Location: [facility];  Service: Urology;  Laterality: N/A;  . TRANSURETHRAL RESECTION OF BLADDER TUMOR WITH MITOMYCIN-C N/A 12/09/2016   Procedure: CYSTOSCOPY TRANSURETHRAL RESECTION OF BLADDER TUMOR WITH MITOMYCIN-C;  Surgeon: Carolan Clines, MD;  Location: [place];  Service: Urology;  Laterality: N/A;  . VARICOSE VEIN SURGERY  2002  . VASECTOMY      Current Outpatient Medications  Medication Sig Dispense Refill  . aspirin 81 MG tablet Take 81 mg by mouth every evening.     Marland Kitchen atorvastatin (LIPITOR)  40 MG tablet Take 40 mg by mouth every evening.     . bimatoprost (LUMIGAN) 0.01 % SOLN INSTILL ONE DROP IN [facility] EYE AT BEDTIME    . bimatoprost (LUMIGAN) 0.03 % ophthalmic solution 1 drop at bedtime.    Marland Kitchen CIALIS 20 MG tablet Take 20 mg by mouth daily as needed for erectile  dysfunction.     . DOCOSAHEXAENOIC ACID PO Take 1 tablet by mouth 2 (two) times daily.    Marland Kitchen escitalopram (LEXAPRO) 20 MG tablet Take 20 mg by mouth every morning.     . fluticasone (FLONASE) 50 MCG/ACT nasal spray FLUTICASONE PROPIONATE 50 MCG/ACT SUSP---  bid prn    . losartan (COZAAR) 25 MG tablet     . LYCOPENE PO Take 1 tablet by mouth daily.    . Multiple Vitamins-Minerals (CENTRUM SILVER PO) Take 1 tablet by mouth every morning.     . Multiple Vitamins-Minerals (PRESERVISION AREDS 2 PO) Take 1 capsule by mouth 2 (two) times daily.     . Omega-3 Fatty Acids (FISH OIL OMEGA-3 PO) Take by mouth.    . pantoprazole (PROTONIX) 40 MG tablet TAKE ONE TABLET DAILY 30 tablet 6  . timolol (BETIMOL) 0.5 % ophthalmic solution Place 1 drop into the right eye daily.    . timolol (TIMOPTIC) 0.5 % ophthalmic solution Place 1 drop into the right eye daily.    . Omega-3 1000 MG CAPS Take by mouth.     No current facility-administered medications for this visit.     Allergies as of 09/20/2018 - Review Complete 06/21/2018  Allergen Reaction Noted  . Pollen extract Cough 01/13/2013    Vitals: BP 121/77 (BP Location: Right Arm, Patient Position: Sitting, Cuff Size: Normal)   Pulse 68   Ht 6\' 4"  (1.93 m)   Wt 269 lb (122 kg)   BMI 32.74 kg/m  Last Weight:  Wt Readings from Last 1 Encounters:  09/20/18 269 lb (122 kg)   Last Height:   Ht Readings from Last 1 Encounters:  09/20/18 6\' 4"  (1.93 m)     General: The patient is awake, alert and appears  in acute distress from hay fever and sinusitis. Nasal congestion is noted.  The patient is well groomed. He has visible pressure marks.  Head: Normocephalic, atraumatic. Neck is supple. 19- inches  Circumference, from 21" .   Mallampati 3, neck circumference: unchanged.  Retrognathia is noted .  Cardiovascular: Regular rate and rhythm, without murmurs or carotid bruit, and without distended neck veins. Respiratory: Lungs are clear to auscultation.  He has faster RR 23- min.  Skin:   Ankle edema, affecting the ankle sensation for vibration.   Trunk: BMI reduced to 32.7,  he has an erect posture.   Neurologic exam : The patient is awake and alert, oriented to place and time.   Memory subjective  described as intact. Cranial nerves: Pupils are equal and briskly reactive to light.   Nystagmus with gaze to the left , unrestricted visual field . Visual fields by finger perimetry are intact. Facial motor strength is symmetric and tongue and uvula move midline. Hearing intact.  DTR intact, symmetrical. Babinski downgoing.   Assessment:  After physical and neurologic examination, review of laboratory studies, imaging, neurophysiology testing and pre-existing records, assessment will be reviewed on the problem list. Sinusitis with clear serose otitis, rhinitis and vestibular involvement. He has taken a z pack and steroids, gained weight, larger neck and more puffy feet.   OSA -  100% compliance for the last 4 years  , on CPAP well controlled.  Plan:  Treatment plan and additional workup :  New hose, new filter for CPAP, new nasal pillow needed after this period of allergic/ inflammatory sinusitis.  CPAP 8 cm , 2 cm EPR .  Information and education were provided - Rv in 12 month, for follow up on compliance, which has been excellent. He will stay with [facility].   Larey Seat, MD    09-20-2018   Dr Dagmar Hait.

## 2018-09-26 DIAGNOSIS — C672 Malignant neoplasm of lateral wall of bladder: Secondary | ICD-10-CM | POA: Diagnosis not present

## 2018-10-03 DIAGNOSIS — C672 Malignant neoplasm of lateral wall of bladder: Secondary | ICD-10-CM | POA: Diagnosis not present

## 2018-10-10 DIAGNOSIS — I10 Essential (primary) hypertension: Secondary | ICD-10-CM | POA: Diagnosis not present

## 2018-10-10 DIAGNOSIS — K219 Gastro-esophageal reflux disease without esophagitis: Secondary | ICD-10-CM | POA: Diagnosis not present

## 2018-10-10 DIAGNOSIS — K635 Polyp of colon: Secondary | ICD-10-CM | POA: Diagnosis not present

## 2018-10-10 DIAGNOSIS — Z6832 Body mass index (BMI) 32.0-32.9, adult: Secondary | ICD-10-CM | POA: Diagnosis not present

## 2018-10-10 DIAGNOSIS — N390 Urinary tract infection, site not specified: Secondary | ICD-10-CM | POA: Diagnosis not present

## 2018-10-10 DIAGNOSIS — R7301 Impaired fasting glucose: Secondary | ICD-10-CM | POA: Diagnosis not present

## 2018-10-10 DIAGNOSIS — C679 Malignant neoplasm of bladder, unspecified: Secondary | ICD-10-CM | POA: Diagnosis not present

## 2018-10-10 DIAGNOSIS — R109 Unspecified abdominal pain: Secondary | ICD-10-CM | POA: Diagnosis not present

## 2018-10-16 DIAGNOSIS — R109 Unspecified abdominal pain: Secondary | ICD-10-CM | POA: Diagnosis not present

## 2018-10-16 DIAGNOSIS — B356 Tinea cruris: Secondary | ICD-10-CM | POA: Insufficient documentation

## 2018-10-16 DIAGNOSIS — Z6833 Body mass index (BMI) 33.0-33.9, adult: Secondary | ICD-10-CM | POA: Diagnosis not present

## 2018-10-18 DIAGNOSIS — L0889 Other specified local infections of the skin and subcutaneous tissue: Secondary | ICD-10-CM | POA: Diagnosis not present

## 2018-10-18 DIAGNOSIS — B009 Herpesviral infection, unspecified: Secondary | ICD-10-CM | POA: Diagnosis not present

## 2018-10-18 DIAGNOSIS — N485 Ulcer of penis: Secondary | ICD-10-CM | POA: Diagnosis not present

## 2018-10-23 DIAGNOSIS — H401132 Primary open-angle glaucoma, bilateral, moderate stage: Secondary | ICD-10-CM | POA: Diagnosis not present

## 2018-10-23 DIAGNOSIS — M79674 Pain in right toe(s): Secondary | ICD-10-CM | POA: Diagnosis not present

## 2018-10-23 DIAGNOSIS — M109 Gout, unspecified: Secondary | ICD-10-CM | POA: Diagnosis not present

## 2018-11-02 DIAGNOSIS — N521 Erectile dysfunction due to diseases classified elsewhere: Secondary | ICD-10-CM | POA: Diagnosis not present

## 2018-11-02 DIAGNOSIS — N35914 Unspecified anterior urethral stricture, male: Secondary | ICD-10-CM | POA: Diagnosis not present

## 2018-11-02 DIAGNOSIS — C672 Malignant neoplasm of lateral wall of bladder: Secondary | ICD-10-CM | POA: Diagnosis not present

## 2018-11-02 DIAGNOSIS — Z125 Encounter for screening for malignant neoplasm of prostate: Secondary | ICD-10-CM | POA: Diagnosis not present

## 2018-11-02 DIAGNOSIS — R7989 Other specified abnormal findings of blood chemistry: Secondary | ICD-10-CM | POA: Diagnosis not present

## 2018-11-03 DIAGNOSIS — Z3189 Encounter for other procreative management: Secondary | ICD-10-CM | POA: Diagnosis not present

## 2018-11-13 DIAGNOSIS — E291 Testicular hypofunction: Secondary | ICD-10-CM | POA: Diagnosis not present

## 2018-11-13 DIAGNOSIS — R7989 Other specified abnormal findings of blood chemistry: Secondary | ICD-10-CM | POA: Diagnosis not present

## 2018-11-16 ENCOUNTER — Encounter: Payer: Self-pay | Admitting: Neurology

## 2018-11-20 DIAGNOSIS — C672 Malignant neoplasm of lateral wall of bladder: Secondary | ICD-10-CM | POA: Diagnosis not present

## 2018-11-20 DIAGNOSIS — E291 Testicular hypofunction: Secondary | ICD-10-CM | POA: Diagnosis not present

## 2018-11-20 DIAGNOSIS — N35914 Unspecified anterior urethral stricture, male: Secondary | ICD-10-CM | POA: Diagnosis not present

## 2018-11-24 DIAGNOSIS — Z0000 Encounter for general adult medical examination without abnormal findings: Secondary | ICD-10-CM | POA: Diagnosis not present

## 2018-11-27 DIAGNOSIS — E291 Testicular hypofunction: Secondary | ICD-10-CM | POA: Diagnosis not present

## 2018-12-12 DIAGNOSIS — N323 Diverticulum of bladder: Secondary | ICD-10-CM | POA: Diagnosis not present

## 2018-12-12 DIAGNOSIS — Z8551 Personal history of malignant neoplasm of bladder: Secondary | ICD-10-CM | POA: Diagnosis not present

## 2018-12-12 DIAGNOSIS — C672 Malignant neoplasm of lateral wall of bladder: Secondary | ICD-10-CM | POA: Diagnosis not present

## 2018-12-12 DIAGNOSIS — N35914 Unspecified anterior urethral stricture, male: Secondary | ICD-10-CM | POA: Diagnosis not present

## 2018-12-13 DIAGNOSIS — R7989 Other specified abnormal findings of blood chemistry: Secondary | ICD-10-CM | POA: Diagnosis not present

## 2018-12-18 DIAGNOSIS — G4733 Obstructive sleep apnea (adult) (pediatric): Secondary | ICD-10-CM | POA: Diagnosis not present

## 2018-12-25 DIAGNOSIS — E291 Testicular hypofunction: Secondary | ICD-10-CM | POA: Diagnosis not present

## 2018-12-29 DIAGNOSIS — R109 Unspecified abdominal pain: Secondary | ICD-10-CM | POA: Diagnosis not present

## 2018-12-29 DIAGNOSIS — E291 Testicular hypofunction: Secondary | ICD-10-CM | POA: Diagnosis not present

## 2019-01-02 ENCOUNTER — Other Ambulatory Visit: Payer: Self-pay | Admitting: Urology

## 2019-01-02 DIAGNOSIS — E291 Testicular hypofunction: Secondary | ICD-10-CM

## 2019-01-04 DIAGNOSIS — E291 Testicular hypofunction: Secondary | ICD-10-CM | POA: Diagnosis not present

## 2019-01-17 ENCOUNTER — Ambulatory Visit
Admission: RE | Admit: 2019-01-17 | Discharge: 2019-01-17 | Disposition: A | Discharge status: Home / Self Care | Payer: PPO | Source: Ambulatory Visit | Attending: Urology | Admitting: Urology

## 2019-01-17 DIAGNOSIS — E236 Other disorders of pituitary gland: Secondary | ICD-10-CM | POA: Diagnosis not present

## 2019-01-17 DIAGNOSIS — E291 Testicular hypofunction: Secondary | ICD-10-CM

## 2019-01-17 MED ORDER — GADOBENATE DIMEGLUMINE 529 MG/ML IV SOLN
10.0000 mL | Freq: Once | INTRAVENOUS | Status: AC | PRN
  Administered 2019-01-17: 10 mL via INTRAVENOUS

## 2019-01-18 ENCOUNTER — Encounter: Payer: Self-pay | Admitting: Internal Medicine

## 2019-01-18 ENCOUNTER — Ambulatory Visit: Payer: PPO | Admitting: Internal Medicine

## 2019-01-18 VITALS — BP 134/70 | HR 72 | Ht 76.0 in | Wt 286.2 lb

## 2019-01-18 DIAGNOSIS — K2100 Gastro-esophageal reflux disease with esophagitis, without bleeding: Secondary | ICD-10-CM

## 2019-01-18 DIAGNOSIS — R159 Full incontinence of feces: Secondary | ICD-10-CM

## 2019-01-18 MED ORDER — PANTOPRAZOLE SODIUM 40 MG PO TBEC: 40.0000 mg | DELAYED_RELEASE_TABLET | Freq: Every day | ORAL | 11 refills | Status: DC

## 2019-01-18 NOTE — Patient Instructions (Addendum)
If you are age 68 or older, your body mass index should be between 23-30. Your Body mass index is 34.84 kg/m. If this is out of the aforementioned range listed, please consider follow up with your Primary Care Provider.  If you are age 16 or younger, your body mass index should be between 19-25. Your Body mass index is 34.84 kg/m. If this is out of the aformentioned range listed, please consider follow up with your Primary Care Provider.    It was a pleasure to see you today!  Dr. Henrene Pastor

## 2019-01-18 NOTE — Progress Notes (Signed)
HISTORY OF PRESENT ILLNESS:  Brian Cortez is a 68 y.o. male with multiple medical problems as listed below who presents today for follow-up regarding management of his chronic GERD.  Brian Cortez was last evaluated May 31, 2017 regarding minor fecal soilage.  We made several recommendations including fiber supplementation and probiotic.  His problem has improved except for rare instances.  In terms of GERD, he continues on pantoprazole 40 mg daily.  On medication good control of symptoms.  No dysphasia.  He is currently being treated for bladder cancer by Dr. Lawerance Bach.  His GI review of systems is otherwise negative.  Review of outside blood work from June 28, 2018 shows unremarkable CBC with hemoglobin 16.4.  Normal basic metabolic panel.  He last underwent colonoscopy April 2017.  Sigmoid diverticulosis and hyperplastic polyp.  Follow-up in 10 years recommended.  REVIEW OF SYSTEMS:  All non-GI ROS negative unless otherwise stated in the HPI except for sinus and allergies  Past Medical History:  Diagnosis Date  . Anxiety   . Bladder tumor   . Carpal tunnel syndrome, left    intermittant  . Chronic seasonal allergic rhinitis   . Coronary artery disease    per Dr Aundra Dubin (cardiologist) note --- noted on CT chest coronary calcification -- ETT-Cardiolite normal in 2014  . DDD (degenerative disc disease), cervical   . Dilated cardiomyopathy ([place])   . Diverticulosis of colon   . Fecal incontinence   . GERD (gastroesophageal reflux disease)   . Grade II diastolic dysfunction 58/52/7782   per ECHO   . Hemorrhoids   . Hiatal hernia   . History of adenomatous polyp of colon    2001;  2007;  2012  . History of bladder cancer urologist-  dr Gaynelle Arabian   11-06-2013  s/p TURBT and BCG tx's  . History of kidney stones    about 45 years ago  . History of panic attacks   . History of urethral stricture   . Hyperlipidemia   . Hypertension   . Hypogonadism in male   . LVH (left ventricular  hypertrophy) 06/17/2017   Mild, noted on ECHO  . Nonischemic cardiomyopathy [facility])    cardiologist-  dr Aundra Dubin-- per last echo 01/ 2016 ef 50-55%  (up from 09/ 2014 ef 45-50%)  . OSA on CPAP    since 2014  . Pneumonia 11/2010   Left lower lobe  . Pulmonary hypertension ([place]) 06/17/2017   Mild, per ECHO    Past Surgical History:  Procedure Laterality Date  . CARDIOVASCULAR STRESS TEST  08-21-2013  dr Aundra Dubin   normal nuclear study w/ no ischemia/  normal LV function and wall motion , ef 63%  . CATARACT EXTRACTION W/ INTRAOCULAR LENS  IMPLANT, BILATERAL  2013  . COLONOSCOPY  last one 03-03-2016  . CYSTO/  DIRECT VISION INTERNAL URETEROTOMY  11/25/2006  . LAPAROSCOPIC CHOLECYSTECTOMY  10/15/2006  . ORBITAL FRACTURE SURGERY  1986   left eye:  Jan 1986--- fixation of fractures, repair of optic nerve decompression , and complex closure  . TONSILLECTOMY  child  . TRANSTHORACIC ECHOCARDIOGRAM  12-16-2014   dr Aundra Dubin   grade 1 diastolic dysfunction, ef 42-35% (improved from last study in 2014 , previous ef 45-50%) /  trivial MR and TR/  mild LAE/  inferior vena cava dilated, respirophasic diameter changes were blunted (<50%), consistant w/ elevated central venous pressure  . TRANSURETHRAL RESECTION OF BLADDER TUMOR Bilateral 05/16/2018   Procedure: TRANSURETHRAL RESECTION OF BLADDER TUMOR (TURBT)/ RETROGRADE;  Surgeon: Festus Aloe, MD;  Location: [facility];  Service: Urology;  Laterality: Bilateral;  . TRANSURETHRAL RESECTION OF BLADDER TUMOR WITH GYRUS (TURBT-GYRUS) N/A 11/06/2013   Procedure: TRANSURETHRAL RESECTION OF BLADDER TUMOR WITH GYRUS (TURBT-GYRUS);  Surgeon: Ailene Rud, MD;  [facility];  Service: Urology;  Laterality: N/A;  . TRANSURETHRAL RESECTION OF BLADDER TUMOR WITH MITOMYCIN-C N/A 12/09/2016   Procedure: CYSTOSCOPY TRANSURETHRAL RESECTION OF BLADDER TUMOR WITH MITOMYCIN-C;  Surgeon: Carolan Clines, MD;  Location: [place];  Service: Urology;  Laterality: N/A;  . VARICOSE VEIN SURGERY  2002  . VASECTOMY      Social History Brian Cortez  reports that he has never smoked. He has never used smokeless tobacco. He reports current alcohol use. He reports that he does not use drugs.  family history includes Heart attack in his paternal grandfather and paternal uncle; Heart attack (age of onset: 39) in his father; Heart attack (age of onset: 25) in his maternal uncle; Prostate cancer in his paternal uncle; Stroke (age of onset: 46) in his father.  Allergies  Allergen Reactions  . Pollen Extract Cough    "sinus issues"       PHYSICAL EXAMINATION: Vital signs: BP 134/70   Pulse 72   Ht 6\' 4"  (1.93 m)   Wt 286 lb 3.2 oz (129.8 kg)   SpO2 97%   BMI 34.84 kg/m   Constitutional: generally well-appearing, no acute distress Psychiatric: alert and oriented x3, cooperative Eyes: extraocular movements intact, anicteric, conjunctiva pink Mouth: oral pharynx moist, no lesions Neck: supple no lymphadenopathy Cardiovascular: heart regular rate and rhythm, no murmur Lungs: clear to auscultation bilaterally Abdomen: soft, nontender, nondistended, no obvious ascites, no peritoneal signs, normal bowel sounds, no organomegaly Rectal: Omitted Extremities: no clubbing, cyanosis, or lower extremity edema bilaterally Skin: no lesions on visible extremities Neuro: No focal deficits.  Cranial nerves intact  ASSESSMENT:  1.  GERD.  Asymptomatic on PPI 2.  History of minor fecal soilage.  Improved 3.  History of adenomatous and hyperplastic colon polyps.  Surveillance up-to-date 4.  Obesity.  Patient is in acting a plan to address this issue 5.  General medical problems.  Stable  PLAN:  1.  Reflux precautions 2.  Continue pantoprazole 40 mg daily.  Prescription refilled 3.  Routine GI office follow-up 2 years.  Sooner if needed 4.  Surveillance colonoscopy around 2027 5.  Resume general medical care with  Dr. Dagmar Hait

## 2019-01-19 DIAGNOSIS — E291 Testicular hypofunction: Secondary | ICD-10-CM | POA: Diagnosis not present

## 2019-01-31 DIAGNOSIS — R82998 Other abnormal findings in urine: Secondary | ICD-10-CM | POA: Diagnosis not present

## 2019-01-31 DIAGNOSIS — Z125 Encounter for screening for malignant neoplasm of prostate: Secondary | ICD-10-CM | POA: Diagnosis not present

## 2019-01-31 DIAGNOSIS — R948 Abnormal results of function studies of other organs and systems: Secondary | ICD-10-CM | POA: Diagnosis not present

## 2019-01-31 DIAGNOSIS — E291 Testicular hypofunction: Secondary | ICD-10-CM | POA: Diagnosis not present

## 2019-01-31 DIAGNOSIS — E7849 Other hyperlipidemia: Secondary | ICD-10-CM | POA: Diagnosis not present

## 2019-01-31 DIAGNOSIS — I10 Essential (primary) hypertension: Secondary | ICD-10-CM | POA: Diagnosis not present

## 2019-01-31 DIAGNOSIS — R7301 Impaired fasting glucose: Secondary | ICD-10-CM | POA: Diagnosis not present

## 2019-01-31 DIAGNOSIS — E669 Obesity, unspecified: Secondary | ICD-10-CM | POA: Diagnosis not present

## 2019-01-31 DIAGNOSIS — Z6835 Body mass index (BMI) 35.0-35.9, adult: Secondary | ICD-10-CM | POA: Diagnosis not present

## 2019-02-02 DIAGNOSIS — E291 Testicular hypofunction: Secondary | ICD-10-CM | POA: Diagnosis not present

## 2019-02-07 DIAGNOSIS — E291 Testicular hypofunction: Secondary | ICD-10-CM | POA: Diagnosis not present

## 2019-02-07 DIAGNOSIS — Z0000 Encounter for general adult medical examination without abnormal findings: Secondary | ICD-10-CM | POA: Diagnosis not present

## 2019-02-07 DIAGNOSIS — Z1331 Encounter for screening for depression: Secondary | ICD-10-CM | POA: Diagnosis not present

## 2019-02-07 DIAGNOSIS — I5189 Other ill-defined heart diseases: Secondary | ICD-10-CM | POA: Diagnosis not present

## 2019-02-07 DIAGNOSIS — K219 Gastro-esophageal reflux disease without esophagitis: Secondary | ICD-10-CM | POA: Diagnosis not present

## 2019-02-07 DIAGNOSIS — I2510 Atherosclerotic heart disease of native coronary artery without angina pectoris: Secondary | ICD-10-CM | POA: Insufficient documentation

## 2019-02-07 DIAGNOSIS — E7849 Other hyperlipidemia: Secondary | ICD-10-CM | POA: Diagnosis not present

## 2019-02-07 DIAGNOSIS — F419 Anxiety disorder, unspecified: Secondary | ICD-10-CM | POA: Diagnosis not present

## 2019-02-07 DIAGNOSIS — I10 Essential (primary) hypertension: Secondary | ICD-10-CM | POA: Diagnosis not present

## 2019-02-07 DIAGNOSIS — G4730 Sleep apnea, unspecified: Secondary | ICD-10-CM | POA: Diagnosis not present

## 2019-02-07 DIAGNOSIS — R7301 Impaired fasting glucose: Secondary | ICD-10-CM | POA: Diagnosis not present

## 2019-02-07 DIAGNOSIS — I2584 Coronary atherosclerosis due to calcified coronary lesion: Secondary | ICD-10-CM | POA: Diagnosis not present

## 2019-02-07 DIAGNOSIS — C679 Malignant neoplasm of bladder, unspecified: Secondary | ICD-10-CM | POA: Diagnosis not present

## 2019-02-21 DIAGNOSIS — E291 Testicular hypofunction: Secondary | ICD-10-CM | POA: Diagnosis not present

## 2019-03-07 DIAGNOSIS — E291 Testicular hypofunction: Secondary | ICD-10-CM | POA: Diagnosis not present

## 2019-03-16 DIAGNOSIS — H401132 Primary open-angle glaucoma, bilateral, moderate stage: Secondary | ICD-10-CM | POA: Diagnosis not present

## 2019-03-20 DIAGNOSIS — G4733 Obstructive sleep apnea (adult) (pediatric): Secondary | ICD-10-CM | POA: Diagnosis not present

## 2019-03-21 DIAGNOSIS — E291 Testicular hypofunction: Secondary | ICD-10-CM | POA: Diagnosis not present

## 2019-03-28 DIAGNOSIS — H353131 Nonexudative age-related macular degeneration, bilateral, early dry stage: Secondary | ICD-10-CM | POA: Diagnosis not present

## 2019-03-28 DIAGNOSIS — H3530 Unspecified macular degeneration: Secondary | ICD-10-CM | POA: Diagnosis not present

## 2019-03-28 DIAGNOSIS — H35433 Paving stone degeneration of retina, bilateral: Secondary | ICD-10-CM | POA: Diagnosis not present

## 2019-03-28 DIAGNOSIS — H43811 Vitreous degeneration, right eye: Secondary | ICD-10-CM | POA: Diagnosis not present

## 2019-04-04 DIAGNOSIS — E291 Testicular hypofunction: Secondary | ICD-10-CM | POA: Diagnosis not present

## 2019-04-13 DIAGNOSIS — Z8551 Personal history of malignant neoplasm of bladder: Secondary | ICD-10-CM | POA: Diagnosis not present

## 2019-04-18 DIAGNOSIS — E291 Testicular hypofunction: Secondary | ICD-10-CM | POA: Diagnosis not present

## 2019-04-26 DIAGNOSIS — I10 Essential (primary) hypertension: Secondary | ICD-10-CM | POA: Diagnosis not present

## 2019-04-26 DIAGNOSIS — Z6834 Body mass index (BMI) 34.0-34.9, adult: Secondary | ICD-10-CM | POA: Diagnosis not present

## 2019-04-26 DIAGNOSIS — R7303 Prediabetes: Secondary | ICD-10-CM | POA: Diagnosis not present

## 2019-04-26 DIAGNOSIS — E8881 Metabolic syndrome: Secondary | ICD-10-CM | POA: Diagnosis not present

## 2019-04-26 DIAGNOSIS — E781 Pure hyperglyceridemia: Secondary | ICD-10-CM | POA: Diagnosis not present

## 2019-05-02 DIAGNOSIS — E291 Testicular hypofunction: Secondary | ICD-10-CM | POA: Diagnosis not present

## 2019-05-16 DIAGNOSIS — E291 Testicular hypofunction: Secondary | ICD-10-CM | POA: Diagnosis not present

## 2019-05-16 DIAGNOSIS — D225 Melanocytic nevi of trunk: Secondary | ICD-10-CM | POA: Diagnosis not present

## 2019-05-16 DIAGNOSIS — D1801 Hemangioma of skin and subcutaneous tissue: Secondary | ICD-10-CM | POA: Diagnosis not present

## 2019-05-16 DIAGNOSIS — L570 Actinic keratosis: Secondary | ICD-10-CM | POA: Diagnosis not present

## 2019-05-16 DIAGNOSIS — L812 Freckles: Secondary | ICD-10-CM | POA: Diagnosis not present

## 2019-05-16 DIAGNOSIS — L821 Other seborrheic keratosis: Secondary | ICD-10-CM | POA: Diagnosis not present

## 2019-05-23 DIAGNOSIS — E669 Obesity, unspecified: Secondary | ICD-10-CM | POA: Diagnosis not present

## 2019-05-23 DIAGNOSIS — Z6833 Body mass index (BMI) 33.0-33.9, adult: Secondary | ICD-10-CM | POA: Diagnosis not present

## 2019-05-23 DIAGNOSIS — R7303 Prediabetes: Secondary | ICD-10-CM | POA: Diagnosis not present

## 2019-05-23 DIAGNOSIS — I10 Essential (primary) hypertension: Secondary | ICD-10-CM | POA: Diagnosis not present

## 2019-05-28 DIAGNOSIS — H401132 Primary open-angle glaucoma, bilateral, moderate stage: Secondary | ICD-10-CM | POA: Diagnosis not present

## 2019-05-30 DIAGNOSIS — E291 Testicular hypofunction: Secondary | ICD-10-CM | POA: Diagnosis not present

## 2019-06-13 DIAGNOSIS — E291 Testicular hypofunction: Secondary | ICD-10-CM | POA: Diagnosis not present

## 2019-06-15 ENCOUNTER — Telehealth: Payer: Self-pay

## 2019-06-15 NOTE — Telephone Encounter (Signed)

## 2019-06-18 ENCOUNTER — Other Ambulatory Visit: Payer: Self-pay

## 2019-06-18 ENCOUNTER — Encounter: Payer: Self-pay | Admitting: Cardiology

## 2019-06-18 ENCOUNTER — Ambulatory Visit (INDEPENDENT_AMBULATORY_CARE_PROVIDER_SITE_OTHER): Payer: PPO | Admitting: Cardiology

## 2019-06-18 VITALS — BP 132/80 | HR 64 | Ht 76.0 in | Wt 272.1 lb

## 2019-06-18 DIAGNOSIS — I2510 Atherosclerotic heart disease of native coronary artery without angina pectoris: Secondary | ICD-10-CM | POA: Diagnosis not present

## 2019-06-18 NOTE — Patient Instructions (Signed)
Medication Instructions:  none If you need a refill on your cardiac medications before your next appointment, please call your pharmacy.   Lab work: none If you have labs (blood work) drawn today and your tests are completely normal, you will receive your results only by: Marland Kitchen MyChart Message (if you have MyChart) OR . A paper copy in the mail If you have any lab test that is abnormal or we need to change your treatment, we will call you to review the results.  Testing/Procedures: none  Follow-Up: At [facility], you and your health needs are our priority.  As part of our continuing mission to provide you with exceptional heart care, we have created designated Provider Care Teams.  These Care Teams include your primary Cardiologist (physician) and Advanced Practice Providers (APPs -  Physician Assistants and Nurse Practitioners) who all work together to provide you with the care you need, when you need it. You will need a follow up appointment in 1 years.  Please call our office 2 months in advance to schedule this appointment.  You may see Dr Radford Pax or one of the following Advanced Practice Providers on your designated Care Team:   Lyda Jester, PA-C Melina Copa, PA-C . Ermalinda Barrios, PA-C  Any Other Special Instructions Will Be Listed Below (If Applicable).

## 2019-06-18 NOTE — Progress Notes (Signed)
06/18/2019 Early [name]   01-Jul-1951  [id number]  Primary Physician Prince Solian, MD Primary Cardiologist: Dr. Radford Pax Electrophysiologist: None   Reason for Visit/CC: f/u for CAD  HPI:  Brian Cortez is a 68 y.o. male with a hx of hyperlipidemia,HTN, DCM with priorEF 45-50% with mild global hypokinesis,coronary artery calcification on a chest CT withnormal nuclear stress test andEF was 63% on the Cardiolite (contrasting with the echo). He also has a history ofbladder cancer and had resection and BCG treatment.  He is a former Pt of Dr. Aundra Dubin. Now followed by Dr. Radford Pax. Last OV was 06/21/2018 and he was doing well w/o cardiac symptoms. He is back today for his annual f/u.   He is doing well. No anginal symptoms. Denies CP and dyspnea. He plays golf. No limitation. Prior to [place], he was at the gym 3 days a week. He hopes to resume piliates soon as well as CPX training. He notes significant weight loss, through lifestyle modification. He is in a weight loss program through Dr. Elsworth Soho. Has lsot 20 lb and has been able to scale back on Losartan dose. His LDL has also improved. Last LP showed LDL at 46 mg/dL. His TG however were poorly controlled, elevated at 388. He takes fish oil and has since reduced his ETOH intake ~80%. He has f/u with repeat lipid panel in September.    Current Meds  Medication Sig  . allopurinol (ZYLOPRIM) 300 MG tablet Take 300 mg by mouth daily.  Marland Kitchen aspirin 81 MG tablet Take 81 mg by mouth every evening.   Marland Kitchen atorvastatin (LIPITOR) 40 MG tablet Take 40 mg by mouth every evening.   . bimatoprost (LUMIGAN) 0.01 % SOLN INSTILL ONE DROP IN [facility] EYE AT BEDTIME  . CIALIS 20 MG tablet Take 20 mg by mouth daily as needed for erectile dysfunction.   . DOCOSAHEXAENOIC ACID PO Take 1 tablet by mouth 2 (two) times daily.  Marland Kitchen escitalopram (LEXAPRO) 20 MG tablet Take 20 mg by mouth every morning.   . fluticasone (FLONASE) 50 MCG/ACT nasal spray FLUTICASONE  PROPIONATE 50 MCG/ACT SUSP---  bid prn  . losartan (COZAAR) 25 MG tablet   . Multiple Vitamins-Minerals (CENTRUM SILVER PO) Take 1 tablet by mouth every morning.   . Multiple Vitamins-Minerals (PRESERVISION AREDS 2 PO) Take 1 capsule by mouth 2 (two) times daily.   . Omega-3 1000 MG CAPS Take by mouth.  . Omega-3 Fatty Acids (FISH OIL OMEGA-3 PO) Take by mouth.  . pantoprazole (PROTONIX) 40 MG tablet Take 1 tablet (40 mg total) by mouth daily.  Marland Kitchen testosterone cypionate (DEPO-TESTOSTERONE) 200 MG/ML injection Inject into the muscle. Use as directed  . timolol (BETIMOL) 0.5 % ophthalmic solution Place 1 drop into the right eye daily.   Allergies  Allergen Reactions  . Pollen Extract Cough    "sinus issues"   Past Medical History:  Diagnosis Date  . Anxiety   . Bladder tumor   . Carpal tunnel syndrome, left    intermittant  . Chronic seasonal allergic rhinitis   . Coronary artery disease    per Dr Aundra Dubin (cardiologist) note --- noted on CT chest coronary calcification -- ETT-Cardiolite normal in 2014  . DDD (degenerative disc disease), cervical   . Dilated cardiomyopathy ([place])   . Diverticulosis of colon   . Fecal incontinence   . GERD (gastroesophageal reflux disease)   . Grade II diastolic dysfunction 75/08/2584   per ECHO   . Hemorrhoids   . Hiatal  hernia   . History of adenomatous polyp of colon    2001;  2007;  2012  . History of bladder cancer urologist-  dr Gaynelle Arabian   11-06-2013  s/p TURBT and BCG tx's  . History of kidney stones    about 45 years ago  . History of panic attacks   . History of urethral stricture   . Hyperlipidemia   . Hypertension   . Hypogonadism in male   . LVH (left ventricular hypertrophy) 06/17/2017   Mild, noted on ECHO  . Nonischemic cardiomyopathy [facility])    cardiologist-  dr Aundra Dubin-- per last echo 01/ 2016 ef 50-55%  (up from 09/ 2014 ef 45-50%)  . OSA on CPAP    since 2014  . Pneumonia 11/2010   Left lower lobe  . Pulmonary  hypertension ([place]) 06/17/2017   Mild, per ECHO   Family History  Problem Relation Age of Onset  . Heart attack Father 35  . Stroke Father 45  . Heart attack Paternal Grandfather        in 33s  . Prostate cancer Paternal Uncle   . Heart attack Paternal Uncle         > 55  . Heart attack Maternal Uncle 80       > 55  . Colon cancer Neg Hx   . Diabetes Neg Hx    Past Surgical History:  Procedure Laterality Date  . CARDIOVASCULAR STRESS TEST  08-21-2013  dr Aundra Dubin   normal nuclear study w/ no ischemia/  normal LV function and wall motion , ef 63%  . CATARACT EXTRACTION W/ INTRAOCULAR LENS  IMPLANT, BILATERAL  2013  . COLONOSCOPY  last one 03-03-2016  . CYSTO/  DIRECT VISION INTERNAL URETEROTOMY  11/25/2006  . LAPAROSCOPIC CHOLECYSTECTOMY  10/15/2006  . ORBITAL FRACTURE SURGERY  1986   left eye:  Jan 1986--- fixation of fractures, repair of optic nerve decompression , and complex closure  . TONSILLECTOMY  child  . TRANSTHORACIC ECHOCARDIOGRAM  12-16-2014   dr Aundra Dubin   grade 1 diastolic dysfunction, ef 94-85% (improved from last study in 2014 , previous ef 45-50%) /  trivial MR and TR/  mild LAE/  inferior vena cava dilated, respirophasic diameter changes were blunted (<50%), consistant w/ elevated central venous pressure  . TRANSURETHRAL RESECTION OF BLADDER TUMOR Bilateral 05/16/2018   Procedure: TRANSURETHRAL RESECTION OF BLADDER TUMOR (TURBT)/ RETROGRADE;  Surgeon: Festus Aloe, MD;  [facility];  Service: Urology;  Laterality: Bilateral;  . TRANSURETHRAL RESECTION OF BLADDER TUMOR WITH GYRUS (TURBT-GYRUS) N/A 11/06/2013   Procedure: TRANSURETHRAL RESECTION OF BLADDER TUMOR WITH GYRUS (TURBT-GYRUS);  Surgeon: Ailene Rud, MD;  Location: [facility];  Service: Urology;  Laterality: N/A;  . TRANSURETHRAL RESECTION OF BLADDER TUMOR WITH MITOMYCIN-C N/A 12/09/2016   Procedure: CYSTOSCOPY TRANSURETHRAL RESECTION OF BLADDER TUMOR WITH MITOMYCIN-C;  Surgeon:  Carolan Clines, MD;  Location: [facility];  Service: Urology;  Laterality: N/A;  . VARICOSE VEIN SURGERY  2002  . VASECTOMY     Social History   Socioeconomic History  . Marital status: Married    Spouse name: Pamala Hurry  . Number of children: 2  . Years of education: Post Grad  . Highest education level: Not on file  Occupational History  . Occupation: BANK -PRESIDENT    Employer: [employer]  . Occupation: PRESIDENT    Employer: [employer]  Social Needs  . Financial resource strain: Not on file  . Food insecurity  Worry: Not on file    Inability: Not on file  . Transportation needs    Medical: Not on file    Non-medical: Not on file  Tobacco Use  . Smoking status: Never Smoker  . Smokeless tobacco: Never Used  Substance and Sexual Activity  . Alcohol use: Yes    Alcohol/week: 0.0 standard drinks    Comment: occasional  . Drug use: No  . Sexual activity: Not on file    Comment: VASECTOMY  Lifestyle  . Physical activity    Days per week: Not on file    Minutes per session: Not on file  . Stress: Not on file  Relationships  . Social Herbalist on phone: Not on file    Gets together: Not on file    Attends religious service: Not on file    Active member of club or organization: Not on file    Attends meetings of clubs or organizations: Not on file    Relationship status: Not on file  . Intimate partner violence    Fear of current or ex partner: Not on file    Emotionally abused: Not on file    Physically abused: Not on file    Forced sexual activity: Not on file  Other Topics Concern  . Not on file  Social History Narrative   Patient lives at home with spouse.    Caffeine Use: 3-4 cups     Lipid Panel     Component Value Date/Time   CHOL 130 09/11/2014 0738   TRIG 285.0 (H) 09/11/2014 0738   HDL 34.40 (L) 09/11/2014 0738   CHOLHDL 4 09/11/2014 0738   VLDL 57.0 (H) 09/11/2014 0738   LDLCALC 42 10/16/2013 0823    LDLDIRECT 67.7 09/11/2014 0738    Review of Systems: General: negative for chills, fever, night sweats or weight changes.  Cardiovascular: negative for chest pain, dyspnea on exertion, edema, orthopnea, palpitations, paroxysmal nocturnal dyspnea or shortness of breath Dermatological: negative for rash Respiratory: negative for cough or wheezing Urologic: negative for hematuria Abdominal: negative for nausea, vomiting, diarrhea, bright red blood per rectum, melena, or hematemesis Neurologic: negative for visual changes, syncope, or dizziness All other systems reviewed and are otherwise negative except as noted above.   Physical Exam:  Blood pressure 132/80, pulse 64, height 6\' 4"  (1.93 m), weight 272 lb 1.9 oz (123.4 kg), SpO2 98 %.  General appearance: alert, cooperative and no distress Neck: no carotid bruit and no JVD Lungs: clear to auscultation bilaterally Heart: regular rate and rhythm, S1, S2 normal, no murmur, click, rub or gallop Extremities: extremities normal, atraumatic, no cyanosis or edema Pulses: 2+ and symmetric Skin: Skin color, texture, turgor normal. No rashes or lesions Neurologic: Grossly normal  EKG NSR, LVH 64 bpm  -- personally reviewed   ASSESSMENT AND PLAN:   1. Coronary Artery Calcifications on Chest CT: had NST that was negative for ischemia. Has been treated medically. He denies angina. No exertional CP or dyspnea. Continue medical therapy w/ ASA and statin. No  blocker due to resting HR in the low 60s.   2. Dilated Cardiomyopathy: EF improved on most recent studies. Previously 45-50%, but normal at 60-65% on last study in 2019. Denies dyspnea. No LEE. Continue losartan.   3. HTN: controlled on current regimen. On losartan. PCP follows labs.   4. HLD: LDL goal < 70 given coronary calcifications. He is on statin therapy w/ Lipitor 40 mg. PCP follows lipids. Last FLP  01/2019 showed controlled LDL at 46 mg/dL.  5. Hypertriglyceridemia: last lipid panel  showed elevated TGs at 388. He is on fish oil and has since reduced his ETOH intake by~80%, per pt report. He has f/u with PCP in September and will have repeat labs. If not at goal, would recommend considering Vascepa. I will route note to PCP to inform of recs.    Follow-Up w/ Dr. Radford Pax in 1 yr  Nelida Gores, MHS [facility] 06/18/2019 2:37 PM

## 2019-06-22 DIAGNOSIS — E669 Obesity, unspecified: Secondary | ICD-10-CM | POA: Diagnosis not present

## 2019-06-22 DIAGNOSIS — I10 Essential (primary) hypertension: Secondary | ICD-10-CM | POA: Diagnosis not present

## 2019-06-22 DIAGNOSIS — Z6833 Body mass index (BMI) 33.0-33.9, adult: Secondary | ICD-10-CM | POA: Diagnosis not present

## 2019-06-25 DIAGNOSIS — E291 Testicular hypofunction: Secondary | ICD-10-CM | POA: Diagnosis not present

## 2019-06-27 DIAGNOSIS — E291 Testicular hypofunction: Secondary | ICD-10-CM | POA: Diagnosis not present

## 2019-07-11 DIAGNOSIS — E291 Testicular hypofunction: Secondary | ICD-10-CM | POA: Diagnosis not present

## 2019-07-12 ENCOUNTER — Other Ambulatory Visit: Payer: Self-pay

## 2019-07-12 ENCOUNTER — Ambulatory Visit: Payer: PPO | Admitting: Podiatry

## 2019-07-12 ENCOUNTER — Ambulatory Visit: Payer: PPO

## 2019-07-12 VITALS — Temp 97.9°F

## 2019-07-12 DIAGNOSIS — M79676 Pain in unspecified toe(s): Secondary | ICD-10-CM

## 2019-07-12 DIAGNOSIS — M19071 Primary osteoarthritis, right ankle and foot: Secondary | ICD-10-CM

## 2019-07-12 DIAGNOSIS — B351 Tinea unguium: Secondary | ICD-10-CM

## 2019-07-12 DIAGNOSIS — M79671 Pain in right foot: Secondary | ICD-10-CM

## 2019-07-12 MED ORDER — CICLOPIROX 8 % EX SOLN: Freq: Every day | CUTANEOUS | 0 refills | Status: DC

## 2019-07-16 DIAGNOSIS — Z8551 Personal history of malignant neoplasm of bladder: Secondary | ICD-10-CM | POA: Diagnosis not present

## 2019-07-16 DIAGNOSIS — E291 Testicular hypofunction: Secondary | ICD-10-CM | POA: Diagnosis not present

## 2019-07-16 DIAGNOSIS — N400 Enlarged prostate without lower urinary tract symptoms: Secondary | ICD-10-CM | POA: Diagnosis not present

## 2019-07-25 DIAGNOSIS — E291 Testicular hypofunction: Secondary | ICD-10-CM | POA: Diagnosis not present

## 2019-08-01 DIAGNOSIS — E291 Testicular hypofunction: Secondary | ICD-10-CM | POA: Diagnosis not present

## 2019-08-06 DIAGNOSIS — D751 Secondary polycythemia: Secondary | ICD-10-CM | POA: Diagnosis not present

## 2019-08-06 DIAGNOSIS — C679 Malignant neoplasm of bladder, unspecified: Secondary | ICD-10-CM | POA: Diagnosis not present

## 2019-08-06 DIAGNOSIS — E291 Testicular hypofunction: Secondary | ICD-10-CM | POA: Diagnosis not present

## 2019-08-06 DIAGNOSIS — I10 Essential (primary) hypertension: Secondary | ICD-10-CM | POA: Diagnosis not present

## 2019-08-06 DIAGNOSIS — R7301 Impaired fasting glucose: Secondary | ICD-10-CM | POA: Diagnosis not present

## 2019-08-06 DIAGNOSIS — G4730 Sleep apnea, unspecified: Secondary | ICD-10-CM | POA: Diagnosis not present

## 2019-08-06 DIAGNOSIS — I2584 Coronary atherosclerosis due to calcified coronary lesion: Secondary | ICD-10-CM | POA: Diagnosis not present

## 2019-08-06 DIAGNOSIS — M7582 Other shoulder lesions, left shoulder: Secondary | ICD-10-CM | POA: Diagnosis not present

## 2019-08-08 DIAGNOSIS — S46012D Strain of muscle(s) and tendon(s) of the rotator cuff of left shoulder, subsequent encounter: Secondary | ICD-10-CM | POA: Diagnosis not present

## 2019-08-08 DIAGNOSIS — E291 Testicular hypofunction: Secondary | ICD-10-CM | POA: Diagnosis not present

## 2019-08-08 DIAGNOSIS — M6281 Muscle weakness (generalized): Secondary | ICD-10-CM | POA: Diagnosis not present

## 2019-08-08 DIAGNOSIS — M25512 Pain in left shoulder: Secondary | ICD-10-CM | POA: Diagnosis not present

## 2019-08-15 ENCOUNTER — Ambulatory Visit: Payer: PPO | Admitting: Cardiology

## 2019-08-19 NOTE — Progress Notes (Signed)
Subjective:  Patient ID: Brian Cortez, male    DOB: August 24, 1951,  MRN: [id number]  Chief Complaint  Patient presents with  . Nail Problem    Nail trim right 1st nail. Pt states some discomfort at the base of his right 1st toenail. Pt states "crawling sensation" at base of nail, states nail is too thick to cut on his own.    68 y.o. male presents with the above complaint.  Reports painfully elongated nails to both feet.  Review of Systems: Negative except as noted in the HPI. Denies N/V/F/Ch.  Past Medical History:  Diagnosis Date  . Anxiety   . Bladder tumor   . Carpal tunnel syndrome, left    intermittant  . Chronic seasonal allergic rhinitis   . Coronary artery disease    per Dr Aundra Dubin (cardiologist) note --- noted on CT chest coronary calcification -- ETT-Cardiolite normal in 2014  . DDD (degenerative disc disease), cervical   . Dilated cardiomyopathy ([employer])   . Diverticulosis of colon   . Fecal incontinence   . GERD (gastroesophageal reflux disease)   . Grade II diastolic dysfunction [id number]   per ECHO   . Hemorrhoids   . Hiatal hernia   . History of adenomatous polyp of colon    2001;  2007;  2012  . History of bladder cancer urologist-  dr Gaynelle Arabian   11-06-2013  s/p TURBT and BCG tx's  . History of kidney stones    about 45 years ago  . History of panic attacks   . History of urethral stricture   . Hyperlipidemia   . Hypertension   . Hypogonadism in male   . LVH (left ventricular hypertrophy) 06/17/2017   Mild, noted on ECHO  . Nonischemic cardiomyopathy [facility])    cardiologist-  dr Aundra Dubin-- per last echo 01/ 2016 ef 50-55%  (up from 09/ 2014 ef 45-50%)  . OSA on CPAP    since 2014  . Pneumonia 11/2010   Left lower lobe  . Pulmonary hypertension ([place]) 06/17/2017   Mild, per ECHO    Current Outpatient Medications:  .  allopurinol (ZYLOPRIM) 300 MG tablet, Take 300 mg by mouth daily., Disp: , Rfl:  .  aspirin 81 MG tablet, Take 81 mg by mouth every  evening. , Disp: , Rfl:  .  atorvastatin (LIPITOR) 40 MG tablet, Take 40 mg by mouth every evening. , Disp: , Rfl:  .  bimatoprost (LUMIGAN) 0.01 % SOLN, INSTILL ONE DROP IN EACH EYE AT BEDTIME, Disp: , Rfl:  .  CIALIS 20 MG tablet, Take 20 mg by mouth daily as needed for erectile dysfunction. , Disp: , Rfl:  .  DOCOSAHEXAENOIC ACID PO, Take 1 tablet by mouth 2 (two) times daily., Disp: , Rfl:  .  escitalopram (LEXAPRO) 20 MG tablet, Take 20 mg by mouth every morning. , Disp: , Rfl:  .  fluticasone (FLONASE) 50 MCG/ACT nasal spray, FLUTICASONE PROPIONATE 50 MCG/ACT SUSP---  bid prn, Disp: , Rfl:  .  losartan (COZAAR) 25 MG tablet, , Disp: , Rfl:  .  Multiple Vitamins-Minerals (CENTRUM SILVER PO), Take 1 tablet by mouth every morning. , Disp: , Rfl:  .  Multiple Vitamins-Minerals (PRESERVISION AREDS 2 PO), Take 1 capsule by mouth 2 (two) times daily. , Disp: , Rfl:  .  Omega-3 1000 MG CAPS, Take by mouth., Disp: , Rfl:  .  Omega-3 Fatty Acids (FISH OIL OMEGA-3 PO), Take by mouth., Disp: , Rfl:  .  pantoprazole (PROTONIX) 40  MG tablet, Take 1 tablet (40 mg total) by mouth daily., Disp: 30 tablet, Rfl: 11 .  testosterone cypionate (DEPO-TESTOSTERONE) 200 MG/ML injection, Inject into the muscle. Use as directed, Disp: , Rfl:  .  timolol (BETIMOL) 0.5 % ophthalmic solution, Place 1 drop into the right eye daily., Disp: , Rfl:  .  ciclopirox (PENLAC) 8 % solution, Apply topically at bedtime. Apply over nail and surrounding skin. Apply daily over previous coat. Remove weekly with file or polish remover., Disp: 6.6 mL, Rfl: 0  Social History   Tobacco Use  Smoking Status Never Smoker  Smokeless Tobacco Never Used    Allergies  Allergen Reactions  . Pollen Extract Cough    "sinus issues"  . Sulfamethoxazole-Trimethoprim Rash   Objective:   Vitals:   07/12/19 0835  Temp: 97.9 F (36.6 C)   There is no height or weight on file to calculate BMI. Constitutional Well developed. Well  nourished.  Vascular Dorsalis pedis pulses palpable bilaterally. Posterior tibial pulses palpable bilaterally. Capillary refill normal to all digits.  No cyanosis or clubbing noted. Pedal hair growth normal.  Neurologic Normal speech. Oriented to person, place, and time. Epicritic sensation to light touch grossly present bilaterally.  Dermatologic Nails elongated dystrophic pain to palpation No open wounds. No skin lesions.  Orthopedic: Normal joint ROM without pain or crepitus bilaterally. No visible deformities. No bony tenderness.   Radiographs: Taken and reviewed midfoot degenerative changes Assessment:   1. Arthritis of foot, right   2. Pain due to onychomycosis of nail    Plan:  Patient was evaluated and treated and all questions answered.  Onychomycosis with pain -Rx penlac -Nails palliatively debridement as below -Educated on self-care  Procedure: Nail Debridement Rationale: Pain Type of Debridement: manual, sharp debridement. Instrumentation: Nail nipper, rotary burr. Number of Nails: 10    Arthritis foot right -Educated on etiology  Return in about 3 months (around 10/12/2019) for Nail Fungus, Arthritis.

## 2019-08-20 DIAGNOSIS — M25512 Pain in left shoulder: Secondary | ICD-10-CM | POA: Diagnosis not present

## 2019-08-20 DIAGNOSIS — M6281 Muscle weakness (generalized): Secondary | ICD-10-CM | POA: Diagnosis not present

## 2019-08-20 DIAGNOSIS — L0889 Other specified local infections of the skin and subcutaneous tissue: Secondary | ICD-10-CM | POA: Diagnosis not present

## 2019-08-20 DIAGNOSIS — S46012D Strain of muscle(s) and tendon(s) of the rotator cuff of left shoulder, subsequent encounter: Secondary | ICD-10-CM | POA: Diagnosis not present

## 2019-08-22 DIAGNOSIS — E291 Testicular hypofunction: Secondary | ICD-10-CM | POA: Diagnosis not present

## 2019-08-23 DIAGNOSIS — E669 Obesity, unspecified: Secondary | ICD-10-CM | POA: Diagnosis not present

## 2019-08-23 DIAGNOSIS — R7303 Prediabetes: Secondary | ICD-10-CM | POA: Diagnosis not present

## 2019-08-23 DIAGNOSIS — E8881 Metabolic syndrome: Secondary | ICD-10-CM | POA: Diagnosis not present

## 2019-08-23 DIAGNOSIS — Z713 Dietary counseling and surveillance: Secondary | ICD-10-CM | POA: Diagnosis not present

## 2019-08-24 DIAGNOSIS — L0291 Cutaneous abscess, unspecified: Secondary | ICD-10-CM | POA: Diagnosis not present

## 2019-09-03 DIAGNOSIS — M25512 Pain in left shoulder: Secondary | ICD-10-CM | POA: Diagnosis not present

## 2019-09-05 DIAGNOSIS — E291 Testicular hypofunction: Secondary | ICD-10-CM | POA: Diagnosis not present

## 2019-09-19 DIAGNOSIS — E291 Testicular hypofunction: Secondary | ICD-10-CM | POA: Diagnosis not present

## 2019-09-24 ENCOUNTER — Ambulatory Visit: Payer: PPO | Admitting: Neurology

## 2019-09-24 ENCOUNTER — Telehealth: Payer: Self-pay | Admitting: Neurology

## 2019-09-24 NOTE — Telephone Encounter (Signed)
Pt called in stating unable to make apt today due to they are still out of power at his home.

## 2019-10-03 DIAGNOSIS — E291 Testicular hypofunction: Secondary | ICD-10-CM | POA: Diagnosis not present

## 2019-10-15 DIAGNOSIS — G4733 Obstructive sleep apnea (adult) (pediatric): Secondary | ICD-10-CM | POA: Diagnosis not present

## 2019-10-16 DIAGNOSIS — E785 Hyperlipidemia, unspecified: Secondary | ICD-10-CM | POA: Diagnosis not present

## 2019-10-16 DIAGNOSIS — Z6832 Body mass index (BMI) 32.0-32.9, adult: Secondary | ICD-10-CM | POA: Diagnosis not present

## 2019-10-16 DIAGNOSIS — I10 Essential (primary) hypertension: Secondary | ICD-10-CM | POA: Diagnosis not present

## 2019-10-16 DIAGNOSIS — E669 Obesity, unspecified: Secondary | ICD-10-CM | POA: Diagnosis not present

## 2019-10-17 DIAGNOSIS — E291 Testicular hypofunction: Secondary | ICD-10-CM | POA: Diagnosis not present

## 2019-10-22 DIAGNOSIS — C672 Malignant neoplasm of lateral wall of bladder: Secondary | ICD-10-CM | POA: Diagnosis not present

## 2019-10-23 DIAGNOSIS — D485 Neoplasm of uncertain behavior of skin: Secondary | ICD-10-CM | POA: Diagnosis not present

## 2019-10-23 DIAGNOSIS — L720 Epidermal cyst: Secondary | ICD-10-CM | POA: Diagnosis not present

## 2019-10-23 DIAGNOSIS — L570 Actinic keratosis: Secondary | ICD-10-CM | POA: Diagnosis not present

## 2019-10-31 DIAGNOSIS — E291 Testicular hypofunction: Secondary | ICD-10-CM | POA: Diagnosis not present

## 2019-11-05 DIAGNOSIS — E291 Testicular hypofunction: Secondary | ICD-10-CM | POA: Diagnosis not present

## 2019-11-14 DIAGNOSIS — E291 Testicular hypofunction: Secondary | ICD-10-CM | POA: Diagnosis not present

## 2019-11-28 DIAGNOSIS — E291 Testicular hypofunction: Secondary | ICD-10-CM | POA: Diagnosis not present

## 2019-12-12 DIAGNOSIS — E291 Testicular hypofunction: Secondary | ICD-10-CM | POA: Diagnosis not present

## 2019-12-13 ENCOUNTER — Ambulatory Visit: Payer: PPO | Attending: Internal Medicine

## 2019-12-13 DIAGNOSIS — Z23 Encounter for immunization: Secondary | ICD-10-CM

## 2019-12-13 NOTE — Progress Notes (Signed)
   Covid-19 Vaccination Clinic  Name:  Brian Cortez    MRN: [id number] DOB: 11-15-1951  12/13/2019  Brian Cortez was observed post Covid-19 immunization for 15 minutes without incidence. He was provided with Vaccine Information Sheet and instruction to access the V-Safe system.   Brian Cortez was instructed to call 911 with any severe reactions post vaccine: Marland Kitchen Difficulty breathing  . Swelling of your face and throat  . A fast heartbeat  . A bad rash all over your body  . Dizziness and weakness    Immunizations Administered    Name Date Dose VIS Date Route   Pfizer COVID-19 Vaccine 12/13/2019  6:05 PM 0.3 mL 11/02/2019 Intramuscular   Manufacturer: [place]   Lot: [id number]   [place]: [id number]

## 2019-12-14 ENCOUNTER — Ambulatory Visit: Payer: PPO

## 2019-12-19 ENCOUNTER — Encounter: Payer: Self-pay | Admitting: Neurology

## 2019-12-19 ENCOUNTER — Ambulatory Visit: Payer: PPO | Admitting: Neurology

## 2019-12-19 ENCOUNTER — Other Ambulatory Visit: Payer: Self-pay

## 2019-12-19 VITALS — BP 134/81 | HR 72 | Temp 97.3°F | Ht 75.75 in | Wt 264.0 lb

## 2019-12-19 DIAGNOSIS — G4733 Obstructive sleep apnea (adult) (pediatric): Secondary | ICD-10-CM | POA: Diagnosis not present

## 2019-12-19 DIAGNOSIS — Z5189 Encounter for other specified aftercare: Secondary | ICD-10-CM | POA: Diagnosis not present

## 2019-12-19 DIAGNOSIS — R530 Neoplastic (malignant) related fatigue: Secondary | ICD-10-CM

## 2019-12-19 DIAGNOSIS — Z9989 Dependence on other enabling machines and devices: Secondary | ICD-10-CM

## 2019-12-19 DIAGNOSIS — E291 Testicular hypofunction: Secondary | ICD-10-CM | POA: Diagnosis not present

## 2019-12-19 DIAGNOSIS — Z7689 Persons encountering health services in other specified circumstances: Secondary | ICD-10-CM | POA: Diagnosis not present

## 2019-12-19 NOTE — Patient Instructions (Signed)

## 2019-12-19 NOTE — Progress Notes (Signed)
[facility]  Provider:  Larey Seat, M D  Referring Provider: Prince Solian, MD Primary Care Physician:  Prince Solian, MD  Chief Complaint  Patient presents with  . Follow-up    pt alone, rm 11. pt states things are well with machine. educated that patient is eligible for new machine if he needs it. set up was  08/2014. pt states current machine working well. DME [employer] [facility]    HPI:  Brian GRUDZIEN is a 69 y.o. male seen on 12-19-2019, he follows Dr. Dagmar Hait for PCP.  He follows for CPAP compliance. His machine is now 69 years old and he needs a new one. He has not contracted Covid to his knowledge , he got the first shot last Thursday.  His diagnosis is OSA- allergic rhinitis - obesity- pneumonia- he no longer has nocturia.    Last seen here as a revisit on 09-20-2018, At the pleasure of meeting today with Brian Cortez, and meanwhile retired 69 year old Caucasian married male patient who has been followed here for CPAP needs since 2013 of 14.  He continues to be highly compliant CPAP patient, his average use of time of 7 hours 7 minutes for this 30-day.  And his compliance was 87%, residual AHi at 1.5/h. Uses an S9 Autoset , set at 8 cm , 2 cm EPR.  He was not able to use his CPAP on 5 days due to illness, he has learned of a return of his bladder cancer by cystoscopy- Biopsy Dr. Lawerance Bach - and needed additional work-up and initiation of chemotherapy.  09-14-2017,Brian Cortez is seen here today for a yearly revisit. He has been very compliant CPAP user with 5% compliance averaging 8 hours and 23 minutes at night set pressure is 8 cm water with 27 cm EPR, residual AHI is 0.8 he does not have major air leaks in the low residual AHI is equally distributed between central and obstructive events. He has lost weight visibly. He endorsed the Epworth sleepiness score at 2 points the fatigue severity score at 11 points the geriatric depression score at 1 out of 15  points. He is retired, he travels and enjoys golf. He is taking Administrator.    Fatigue and lightheadedness were improved, until last month. He has no longer nocturia since being on CPAP. In 2016 he went to the ED and was diagnosed with diastolic heart failure, had a nuclear test with Dr. Loralie Champagne - improved on Coreg.  His medical history has changed drastically over the last 12 month, he suffered a congestive heart failure episode and has been controlled by Coreg. His bladder cancer was discovered in Dec 2014. Underwent BCG treatment. Has a 30% re-occurrence risk. He had pneumonia and changed PCPs. The data collection began on 07-28-14 and ended on 08-26-14 CPAP is used at 8 cm water pressure with 2 cm EPR the residual AHI is 1.6 which is excellent,  the patient uses a machine on average 8 hours and 24 minutes at night and has 100% compliance for nightly use  over 4 hours. Median daily usage 8 hours 11 minutes.  Interval history history from 09-09-15, the patient's 100% compliant for the number of 30 days and each of these days over 4 hours of consecutive use has been registered. On average he uses his machine for 8 hours and 47 minutes set pressure is 8 cm water with 2 cm expiratory pressure relief ( EPR) the residual AHI is 1.4 he has minimal  air leaks, his Epworth sleepiness score today is 1. fatigue severity 6 points and the geriatric depression score is endorsed at one point.  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 When I saw him last year he had been suffering from pneumonia and his primary care physician, Dr. Elsworth Soho, had treated him. The patient is a bladder cancer survivor and has been diagnosed with diastolic heart failure. His cardiac function had improved on Coreg. I have followed him for obstructive sleep apnea and he is a CPAP compliant patient with 100% of use of his CPAP at average use of 8 hours and 54 minutes  nightly. CPAP is set at 8 cm water pressure this 2 cm EPR and a residual AHI of 1.4. Minor air leaks are noted, these have no clinical bearing.    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  Sleeping better, SOB today, coughing. has ciliary injection, hay fever.  Severe  allergic rhinitis. Obese. Has not had recurrence of Nocturia. Hot flushes after d/c testosterone supplements.  How likely are you to doze in the following situations: 0 = not likely, 1 = slight chance, 2 = moderate chance, 3 = high chance  Sitting and Reading? Watching Television? Sitting inactive in a public place (theater or meeting)? Lying down in the afternoon when circumstances permit? Sitting and talking to someone? Sitting quietly after lunch without alcohol? In a car, while stopped for a few minutes in traffic? As a passenger in a car for an hour without a break?  Total =10  FSS 13/ 63     He is finished  6 chemotherapy rounds in 2020.  Bladder cancer had returned, this has affected his fatigue which was endorsed at 35 out of 63 possible points, but he has not had excessive daytime sleepiness and endorsed only 2 out of 24 points.   He also is not clinically depressed geriatric depression score endorsed at 2 out of 15 points.   Social History   Socioeconomic History  . Marital status: Married    Spouse name: Pamala Hurry  . Number of children: 2  . Years of education: Post Grad  . Highest education level: Not on file  Occupational History  . Occupation: BANK -PRESIDENT    Employer: [employer]  . Occupation: PRESIDENT    Employer: [employer]  Tobacco Use  . Smoking status: Never Smoker  . Smokeless tobacco: Never Used  Substance and Sexual Activity  . Alcohol use: Yes     Alcohol/week: 0.0 standard drinks    Comment: occasional  . Drug use: No  . Sexual activity: Not on file    Comment: VASECTOMY  Other Topics Concern  . Not on file  Social History Narrative   Patient lives at home with spouse.    Caffeine Use: 3-4 cups   Social Determinants of Health   Financial Resource Strain:   . Difficulty of Paying Living Expenses: Not on file  Food Insecurity:   . Worried About Charity fundraiser in the Last Year: Not on file  . Ran Out of Food in the Last Year: Not on file  Transportation Needs:   . Lack of Transportation (Medical): Not on file  . Lack  of Transportation (Non-Medical): Not on file  Physical Activity:   . Days of Exercise per Week: Not on file  . Minutes of Exercise per Session: Not on file  Stress:   . Feeling of Stress : Not on file  Social Connections:   . Frequency of Communication with Friends and Family: Not on file  . Frequency of Social Gatherings with Friends and Family: Not on file  . Attends Religious Services: Not on file  . Active Member of Clubs or Organizations: Not on file  . Attends Archivist Meetings: Not on file  . Marital Status: Not on file  Intimate Partner Violence:   . Fear of Current or Ex-Partner: Not on file  . Emotionally Abused: Not on file  . Physically Abused: Not on file  . Sexually Abused: Not on file    Family History  Problem Relation Age of Onset  . Heart attack Father 19  . Stroke Father 80  . Heart attack Paternal Grandfather        in 62s  . Prostate cancer Paternal Uncle   . Heart attack Paternal Uncle         > 55  . Heart attack Maternal Uncle 80       > 55  . Colon cancer Neg Hx   . Diabetes Neg Hx     Past Medical History:  Diagnosis Date  . Anxiety   . Bladder tumor   . Carpal tunnel syndrome, left    intermittant  . Chronic seasonal allergic rhinitis   . Coronary artery disease    per Dr Aundra Dubin (cardiologist) note --- noted on CT chest coronary calcification  -- ETT-Cardiolite normal in 2014  . DDD (degenerative disc disease), cervical   . Dilated cardiomyopathy ([place])   . Diverticulosis of colon   . Fecal incontinence   . GERD (gastroesophageal reflux disease)   . Grade II diastolic dysfunction [id number]   per ECHO   . Hemorrhoids   . Hiatal hernia   . History of adenomatous polyp of colon    2001;  2007;  2012  . History of bladder cancer urologist-  dr Gaynelle Arabian   11-06-2013  s/p TURBT and BCG tx's  . History of kidney stones    about 45 years ago  . History of panic attacks   . History of urethral stricture   . Hyperlipidemia   . Hypertension   . Hypogonadism in male   . LVH (left ventricular hypertrophy) 06/17/2017   Mild, noted on ECHO  . Nonischemic cardiomyopathy [facility])    cardiologist-  dr Aundra Dubin-- per last echo 01/ 2016 ef 50-55%  (up from 09/ 2014 ef 45-50%)  . OSA on CPAP    since 2014  . Pneumonia 11/2010   Left lower lobe  . Pulmonary hypertension ([place]) 06/17/2017   Mild, per ECHO    Past Surgical History:  Procedure Laterality Date  . CARDIOVASCULAR STRESS TEST  08-21-2013  dr Aundra Dubin   normal nuclear study w/ no ischemia/  normal LV function and wall motion , ef 63%  . CATARACT EXTRACTION W/ INTRAOCULAR LENS  IMPLANT, BILATERAL  2013  . COLONOSCOPY  last one 03-03-2016  . CYSTO/  DIRECT VISION INTERNAL URETEROTOMY  11/25/2006  . LAPAROSCOPIC CHOLECYSTECTOMY  10/15/2006  . ORBITAL FRACTURE SURGERY  1986   left eye:  Jan 1986--- fixation of fractures, repair of optic nerve decompression , and complex closure  . TONSILLECTOMY  child  . TRANSTHORACIC ECHOCARDIOGRAM  12-16-2014  dr Aundra Dubin   grade 1 diastolic dysfunction, ef 99991111 (improved from last study in 2014 , previous ef 45-50%) /  trivial MR and TR/  mild LAE/  inferior vena cava dilated, respirophasic diameter changes were blunted (<50%), consistant w/ elevated central venous pressure  . TRANSURETHRAL RESECTION OF BLADDER TUMOR Bilateral 05/16/2018    Procedure: TRANSURETHRAL RESECTION OF BLADDER TUMOR (TURBT)/ RETROGRADE;  Surgeon: Festus Aloe, MD;  [facility];  Service: Urology;  Laterality: Bilateral;  . TRANSURETHRAL RESECTION OF BLADDER TUMOR WITH GYRUS (TURBT-GYRUS) N/A 11/06/2013   Procedure: TRANSURETHRAL RESECTION OF BLADDER TUMOR WITH GYRUS (TURBT-GYRUS);  Surgeon: Ailene Rud, MD;  Location: [facility];  Service: Urology;  Laterality: N/A;  . TRANSURETHRAL RESECTION OF BLADDER TUMOR WITH MITOMYCIN-C N/A 12/09/2016   Procedure: CYSTOSCOPY TRANSURETHRAL RESECTION OF BLADDER TUMOR WITH MITOMYCIN-C;  Surgeon: Carolan Clines, MD;  Location: [place];  Service: Urology;  Laterality: N/A;  . VARICOSE VEIN SURGERY  2002  . VASECTOMY      Current Outpatient Medications  Medication Sig Dispense Refill  . allopurinol (ZYLOPRIM) 300 MG tablet Take 300 mg by mouth daily.    Marland Kitchen aspirin 81 MG tablet Take 81 mg by mouth every evening.     Marland Kitchen atorvastatin (LIPITOR) 40 MG tablet Take 40 mg by mouth every evening.     . bimatoprost (LUMIGAN) 0.01 % SOLN INSTILL ONE DROP IN [facility] EYE AT BEDTIME    . CIALIS 20 MG tablet Take 20 mg by mouth daily as needed for erectile dysfunction.     Marland Kitchen escitalopram (LEXAPRO) 20 MG tablet Take 20 mg by mouth every morning.     . fluticasone (FLONASE) 50 MCG/ACT nasal spray FLUTICASONE PROPIONATE 50 MCG/ACT SUSP---  bid prn    . Multiple Vitamins-Minerals (PRESERVISION AREDS 2 PO) Take 1 capsule by mouth 2 (two) times daily.     . Omega-3 Fatty Acids (FISH OIL OMEGA-3 PO) Take by mouth.    . pantoprazole (PROTONIX) 40 MG tablet Take 1 tablet (40 mg total) by mouth daily. 30 tablet 11  . timolol (BETIMOL) 0.5 % ophthalmic solution Place 1 drop into the right eye daily.     No current facility-administered medications for this visit.    Allergies as of 12/19/2019 - Review Complete 12/19/2019  Allergen Reaction Noted  . Pollen extract Cough 01/13/2013  .  Sulfamethoxazole-trimethoprim Rash 11/20/2018    Vitals: BP 134/81   Pulse 72   Temp (!) 97.3 F (36.3 C)   Ht 6' 3.75" (1.924 m)   Wt 264 lb (119.7 kg)   BMI 32.35 kg/m  Last Weight:  Wt Readings from Last 1 Encounters:  12/19/19 264 lb (119.7 kg)   Last Height:   Ht Readings from Last 1 Encounters:  12/19/19 6' 3.75" (1.924 m)     General: The patient is awake, alert and appears  in acute distress from hay fever and sinusitis. Nasal congestion is noted.  The patient is well groomed. He has visible pressure marks.  Head: Normocephalic, atraumatic. Neck is supple. 19- inches  Circumference, from 21" .   Mallampati 3, neck circumference: unchanged.  Retrognathia is noted .  Cardiovascular: Regular rate and rhythm, without murmurs or carotid bruit, and without distended neck veins. Respiratory: Lungs are clear to auscultation. He has faster RR 23- min.  Skin:   Ankle edema, affecting the ankle sensation for vibration.   Trunk: BMI reduced to 32.35,  he has an erect posture.   Neurologic  exam : The patient is awake and alert, oriented to place and time.   Memory subjective  described as intact.  Cranial nerves: no loss of smell or taste- Pupils are equal and briskly reactive to light.   Nystagmus with gaze to the left , unrestricted visual field.  Facial motor strength is symmetric and his tongue and uvula move midline. Hearing intact.  DTR intact, symmetrical. Babinski downgoing.   Assessment:  After physical and neurologic examination, review of laboratory studies, imaging, neurophysiology testing and pre-existing records, assessment will be reviewed on the problem list:  Plan:  OSA - 100% compliance for the last 5 years, on CPAP well controlled.  Brian Cortez has continued to use his CPAP with 100% compliance for days and time with an average user time of 8 hours 50 minutes.  His set pressure on the current S9 AutoSet machine is 8 cmH2O of his 2 cm EPR and his residual  AHI is 0.7 which speaks for an excellent resolution of apnea.  He does have some moderate air leaks however they do not introduce unaccounted apnea.  Based on the age of his machine which is now 5 years he is due for new 1, I will offer him to repeat a sleep test in form of a home sleep test if not attended sleep study to see where his current level of apneas and we will then follow with an auto titration capable CPAP device given that he has done very well on 8 cm I will create a window of pressure around this given data.     He will change to [facility].   Larey Seat, MD     Dr Dagmar Hait.

## 2019-12-24 DIAGNOSIS — E291 Testicular hypofunction: Secondary | ICD-10-CM | POA: Diagnosis not present

## 2019-12-26 DIAGNOSIS — E291 Testicular hypofunction: Secondary | ICD-10-CM | POA: Diagnosis not present

## 2020-01-03 ENCOUNTER — Ambulatory Visit: Payer: PPO | Attending: Internal Medicine

## 2020-01-03 DIAGNOSIS — Z23 Encounter for immunization: Secondary | ICD-10-CM | POA: Insufficient documentation

## 2020-01-03 NOTE — Progress Notes (Signed)
   Covid-19 Vaccination Clinic  Name:  Brian Cortez    MRN: [id number] DOB: 07-26-1951  01/03/2020  [name] was observed post Covid-19 immunization for 15 minutes without incidence. He was provided with Vaccine Information Sheet and instruction to access the V-Safe system.   [name] was instructed to call 911 with any severe reactions post vaccine: Marland Kitchen Difficulty breathing  . Swelling of your face and throat  . A fast heartbeat  . A bad rash all over your body  . Dizziness and weakness    Immunizations Administered    Name Date Dose VIS Date Route   Pfizer COVID-19 Vaccine 01/03/2020 10:28 AM 0.3 mL 11/02/2019 Intramuscular   Manufacturer: [place]   Lot: [id number]   [place]: [id number]

## 2020-01-09 DIAGNOSIS — E291 Testicular hypofunction: Secondary | ICD-10-CM | POA: Diagnosis not present

## 2020-01-14 DIAGNOSIS — G4733 Obstructive sleep apnea (adult) (pediatric): Secondary | ICD-10-CM | POA: Diagnosis not present

## 2020-01-17 DIAGNOSIS — E785 Hyperlipidemia, unspecified: Secondary | ICD-10-CM | POA: Diagnosis not present

## 2020-01-17 DIAGNOSIS — I10 Essential (primary) hypertension: Secondary | ICD-10-CM | POA: Diagnosis not present

## 2020-01-17 DIAGNOSIS — Z6833 Body mass index (BMI) 33.0-33.9, adult: Secondary | ICD-10-CM | POA: Diagnosis not present

## 2020-01-17 DIAGNOSIS — E669 Obesity, unspecified: Secondary | ICD-10-CM | POA: Diagnosis not present

## 2020-01-20 ENCOUNTER — Ambulatory Visit: Payer: PPO

## 2020-01-21 DIAGNOSIS — H401132 Primary open-angle glaucoma, bilateral, moderate stage: Secondary | ICD-10-CM | POA: Diagnosis not present

## 2020-01-23 ENCOUNTER — Other Ambulatory Visit: Payer: Self-pay

## 2020-01-23 ENCOUNTER — Ambulatory Visit (INDEPENDENT_AMBULATORY_CARE_PROVIDER_SITE_OTHER): Payer: PPO | Admitting: Neurology

## 2020-01-23 ENCOUNTER — Other Ambulatory Visit: Payer: Self-pay | Admitting: Internal Medicine

## 2020-01-23 DIAGNOSIS — E291 Testicular hypofunction: Secondary | ICD-10-CM | POA: Diagnosis not present

## 2020-01-23 DIAGNOSIS — Z5189 Encounter for other specified aftercare: Secondary | ICD-10-CM

## 2020-01-23 DIAGNOSIS — G4733 Obstructive sleep apnea (adult) (pediatric): Secondary | ICD-10-CM

## 2020-01-23 DIAGNOSIS — R530 Neoplastic (malignant) related fatigue: Secondary | ICD-10-CM

## 2020-01-28 DIAGNOSIS — E291 Testicular hypofunction: Secondary | ICD-10-CM | POA: Diagnosis not present

## 2020-01-28 DIAGNOSIS — N400 Enlarged prostate without lower urinary tract symptoms: Secondary | ICD-10-CM | POA: Diagnosis not present

## 2020-01-28 DIAGNOSIS — Z8551 Personal history of malignant neoplasm of bladder: Secondary | ICD-10-CM | POA: Diagnosis not present

## 2020-02-06 ENCOUNTER — Encounter: Payer: Self-pay | Admitting: Neurology

## 2020-02-06 ENCOUNTER — Telehealth: Payer: Self-pay | Admitting: Neurology

## 2020-02-06 NOTE — Telephone Encounter (Signed)
Called patient to discuss sleep study results. No answer at this time. LVM for the patient to call back.   

## 2020-02-06 NOTE — Telephone Encounter (Signed)
-----   Message from Larey Seat, MD sent at 02/06/2020 12:42 PM EDT ----- Summary & Diagnosis:   Surprisingly mild OSA at AHI of 9.4/h with strong REM dependence  (REM AHI was 20.6/) and moderate snoring. He has trended to  bradycardia without prolonged hypoxia.   Recommendations:    Yes, Brian Cortez will still need to continue CPAP and I will  prescribe a new, AUTO- titration capable device.  A pressure window of 5-12 cm water is recommended, with 1 cm  EPR, heated humidification and mask of his choosing.   Interpreting Physician: Larey Seat, MD

## 2020-02-06 NOTE — Addendum Note (Signed)
Addended by: Larey Seat on: 02/06/2020 12:42 PM   Modules accepted: Orders

## 2020-02-06 NOTE — Progress Notes (Signed)
Summary & Diagnosis:   Surprisingly mild OSA at AHI of 9.4/h with strong REM dependence  (REM AHI was 20.6/) and moderate snoring. He has trended to  bradycardia without prolonged hypoxia.   Recommendations:    Yes, Brian Cortez will still need to continue CPAP and I will  prescribe a new, AUTO- titration capable device.  A pressure window of 5-12 cm water is recommended, with 1 cm  EPR, heated humidification and mask of his choosing.   Interpreting Physician: Larey Seat, MD

## 2020-02-06 NOTE — Procedures (Signed)
  Patient Information     First Name: Brian Last Name: Cortez ID: [id number]  Birth Date: 10-12-1951 Age: 69 Gender: Male  Referring Provider: Prince Solian, MD BMI: 32.2 (W=264 lb, H=6' 4'')  Neck Circ.:  21 '' Epworth:  10/24   Sleep Study Information    Study Date: Jan 23, 2020 S/H/A Version: 001.001.001.001 / 4.1.1528 / 77 downloaded 01-29-2020  History:    Brian Cortez is an established 69 y.o. male patient and was seen on 12-19-2019, Dr. Dagmar Hait is PCP. He follows for CPAP compliance. His machine is now 69 years old and he needs a new one. He has not contracted Covid to his knowledge, he got the first shot last Thursday. His diagnosis is OSA- allergic rhinitis - obesity- pneumonia- he no longer has nocturia. Followed here for CPAP needs since 2013.  He continues to be highly compliant CPAP patient, his average use of time of 7 hours 7 minutes for this 30-day.  Uses an S9 Autoset , set at 8 cm , 2 cm EPR.      Summary & Diagnosis:    Surprisingly mild OSA at AHI of 9.4/h with strong REM dependence (REM AHI was 20.6/) and moderate snoring. He has trended to bradycardia without prolonged hypoxia.   Recommendations:     Yes, Mr. Cousin will still need to continue CPAP and I will prescribe a new, AUTO- titration capable device.  A pressure window of 5-12 cm water is recommended, with 1 cm EPR, heated humidification and mask of his choosing.  Interpreting Physician: Larey Seat, MD             Sleep Summary  Oxygen Saturation Statistics   Start Study Time: End Study Time: Total Recording Time:  8:06:16 PM 5:45:29 AM 9 h, 39 min  Total Sleep Time % REM of Sleep Time:  8 h, 48 min 11.2    Mean: 92 Minimum: 87 Maximum: 97  Mean of Desaturations Nadirs (%):   90  Oxygen Desaturation. %: 4-9 10-20 >20 Total  Events Number Total  33 100.0  0 0.0  0 0.0  33 100.0  Oxygen Saturation: <90 <=88 <85 <80 <70  Duration (minutes): Sleep % 6.5 1.3 1.2 0.2 0.0 0.0 0.0 0.0  0.0 0.0     Respiratory Indices      Total Events REM NREM All Night  pRDI:  91  pAHI:  82 ODI:  33  pAHIc:  2  % CSR: 0.0 22.7 20.6 12.4 0.0 8.9 8.0 2.7 0.3 10.4 9.4 3.8 0.2       Pulse Rate Statistics during Sleep (BPM)      Mean: 69 Minimum: 50 Maximum: 96    Indices are calculated using technically valid sleep time of  8 h, 44 min. Central-Indices are calculated using technically valid sleep time of  8  h, 26 min. pRDI/pAHI are calculated using oxi desaturations ? 3%  Body Position Statistics  Position Supine Prone Right Left Non-Supine  Sleep (min) 158.5 335.2 0.0 34.5 369.7  Sleep % 30.0 63.5 0.0 6.5 70.0  pRDI 19.4 6.5 N/A 7.0 6.5  pAHI 18.6 5.2 N/A 7.0 5.4  ODI 8.7 1.6 N/A 1.7 1.6     Snoring Statistics Snoring Level (dB) >40 >50 >60 >70 >80 >Threshold (45)  Sleep (min) 84.2 6.1 1.6 0.0 0.0 9.6  Sleep % 15.9 1.2 0.3 0.0 0.0 1.8    Mean: 40 dB Sleep Stages Chart

## 2020-02-11 ENCOUNTER — Encounter: Payer: Self-pay | Admitting: Neurology

## 2020-02-11 NOTE — Telephone Encounter (Signed)
I called pt. I advised pt that Dr. Brett Fairy reviewed their sleep study results and found that pt has sleep apnea. Dr. Brett Fairy recommends that pt auto CPAP. I reviewed PAP compliance expectations with the pt. Pt is agreeable to starting a CPAP and would like to change companies. Pt advised pt that an order will be sent to a DME, [facility], and [facility] will call the pt within about one week after they file with the pt's insurance. [facility] will show the pt how to use the machine, fit for masks, and troubleshoot the CPAP if needed. A follow up appt was made for insurance purposes with Dr. Brett Fairy on June 9,2021 at 3:30 pm. Pt verbalized understanding to arrive 15 minutes early and bring their CPAP. A letter with all of this information in it will be mailed to the pt as a reminder. I verified with the pt that the address we have on file is correct. Pt verbalized understanding of results. Pt had no questions at this time but was encouraged to call back if questions arise. I have sent the order to [facility] and have received confirmation that they have received the order.

## 2020-02-11 NOTE — Telephone Encounter (Signed)
Called the patient and the mailbox was full, was unable to LVM.

## 2020-02-11 NOTE — Telephone Encounter (Signed)
Pt called back in regards to missed call  

## 2020-02-13 DIAGNOSIS — E291 Testicular hypofunction: Secondary | ICD-10-CM | POA: Diagnosis not present

## 2020-02-26 DIAGNOSIS — E7849 Other hyperlipidemia: Secondary | ICD-10-CM | POA: Diagnosis not present

## 2020-02-26 DIAGNOSIS — R7301 Impaired fasting glucose: Secondary | ICD-10-CM | POA: Diagnosis not present

## 2020-02-26 DIAGNOSIS — E291 Testicular hypofunction: Secondary | ICD-10-CM | POA: Diagnosis not present

## 2020-02-26 DIAGNOSIS — Z125 Encounter for screening for malignant neoplasm of prostate: Secondary | ICD-10-CM | POA: Diagnosis not present

## 2020-02-27 DIAGNOSIS — E291 Testicular hypofunction: Secondary | ICD-10-CM | POA: Diagnosis not present

## 2020-02-27 DIAGNOSIS — R82998 Other abnormal findings in urine: Secondary | ICD-10-CM | POA: Diagnosis not present

## 2020-02-27 DIAGNOSIS — G4733 Obstructive sleep apnea (adult) (pediatric): Secondary | ICD-10-CM | POA: Diagnosis not present

## 2020-02-28 DIAGNOSIS — E291 Testicular hypofunction: Secondary | ICD-10-CM | POA: Diagnosis not present

## 2020-03-04 DIAGNOSIS — I5189 Other ill-defined heart diseases: Secondary | ICD-10-CM | POA: Diagnosis not present

## 2020-03-04 DIAGNOSIS — Z0000 Encounter for general adult medical examination without abnormal findings: Secondary | ICD-10-CM | POA: Diagnosis not present

## 2020-03-04 DIAGNOSIS — I2584 Coronary atherosclerosis due to calcified coronary lesion: Secondary | ICD-10-CM | POA: Diagnosis not present

## 2020-03-04 DIAGNOSIS — D751 Secondary polycythemia: Secondary | ICD-10-CM | POA: Diagnosis not present

## 2020-03-04 DIAGNOSIS — M545 Low back pain, unspecified: Secondary | ICD-10-CM | POA: Insufficient documentation

## 2020-03-04 DIAGNOSIS — Z1212 Encounter for screening for malignant neoplasm of rectum: Secondary | ICD-10-CM | POA: Diagnosis not present

## 2020-03-04 DIAGNOSIS — Z1331 Encounter for screening for depression: Secondary | ICD-10-CM | POA: Diagnosis not present

## 2020-03-04 DIAGNOSIS — G4730 Sleep apnea, unspecified: Secondary | ICD-10-CM | POA: Diagnosis not present

## 2020-03-04 DIAGNOSIS — R7301 Impaired fasting glucose: Secondary | ICD-10-CM | POA: Diagnosis not present

## 2020-03-04 DIAGNOSIS — E785 Hyperlipidemia, unspecified: Secondary | ICD-10-CM | POA: Diagnosis not present

## 2020-03-04 DIAGNOSIS — F419 Anxiety disorder, unspecified: Secondary | ICD-10-CM | POA: Diagnosis not present

## 2020-03-04 DIAGNOSIS — K219 Gastro-esophageal reflux disease without esophagitis: Secondary | ICD-10-CM | POA: Diagnosis not present

## 2020-03-04 DIAGNOSIS — C679 Malignant neoplasm of bladder, unspecified: Secondary | ICD-10-CM | POA: Diagnosis not present

## 2020-03-04 DIAGNOSIS — I10 Essential (primary) hypertension: Secondary | ICD-10-CM | POA: Diagnosis not present

## 2020-03-04 DIAGNOSIS — E291 Testicular hypofunction: Secondary | ICD-10-CM | POA: Diagnosis not present

## 2020-03-04 LAB — IFOBT (OCCULT BLOOD): IFOBT: NEGATIVE

## 2020-03-05 DIAGNOSIS — Z7689 Persons encountering health services in other specified circumstances: Secondary | ICD-10-CM | POA: Diagnosis not present

## 2020-03-05 DIAGNOSIS — E291 Testicular hypofunction: Secondary | ICD-10-CM | POA: Diagnosis not present

## 2020-03-12 DIAGNOSIS — E291 Testicular hypofunction: Secondary | ICD-10-CM | POA: Diagnosis not present

## 2020-03-17 DIAGNOSIS — M545 Low back pain: Secondary | ICD-10-CM | POA: Diagnosis not present

## 2020-03-26 DIAGNOSIS — E291 Testicular hypofunction: Secondary | ICD-10-CM | POA: Diagnosis not present

## 2020-03-28 DIAGNOSIS — G4733 Obstructive sleep apnea (adult) (pediatric): Secondary | ICD-10-CM | POA: Diagnosis not present

## 2020-04-02 ENCOUNTER — Encounter (INDEPENDENT_AMBULATORY_CARE_PROVIDER_SITE_OTHER): Payer: Self-pay | Admitting: Ophthalmology

## 2020-04-02 ENCOUNTER — Other Ambulatory Visit: Payer: Self-pay

## 2020-04-02 ENCOUNTER — Ambulatory Visit (INDEPENDENT_AMBULATORY_CARE_PROVIDER_SITE_OTHER): Payer: PPO | Admitting: Ophthalmology

## 2020-04-02 DIAGNOSIS — H3530 Unspecified macular degeneration: Secondary | ICD-10-CM

## 2020-04-02 DIAGNOSIS — H35433 Paving stone degeneration of retina, bilateral: Secondary | ICD-10-CM | POA: Diagnosis not present

## 2020-04-02 DIAGNOSIS — H353131 Nonexudative age-related macular degeneration, bilateral, early dry stage: Secondary | ICD-10-CM

## 2020-04-02 DIAGNOSIS — H43811 Vitreous degeneration, right eye: Secondary | ICD-10-CM | POA: Diagnosis not present

## 2020-04-02 NOTE — Patient Instructions (Signed)
Patient was encouraged to continue the CPAP use to enhance and to maximize oxygenation of the compromised optic nerve function.  Or over my opinion it helps to protect against the progression of macular degeneration in an age group who have sleep apnea untreated.

## 2020-04-02 NOTE — Progress Notes (Signed)
04/02/2020     CHIEF COMPLAINT Patient presents for Retina Follow Up   HISTORY OF PRESENT ILLNESS: Brian Cortez is a 69 y.o. male who presents to the clinic today for:   HPI    Retina Follow Up    Patient presents with  Other.  In both eyes.  This started 1 year ago.  Severity is mild.  Duration of 1 year.  Since onset it is stable.          Comments    1 Year F/U OU  Pt denies noticeable changes to [place] OU since last visit. Pt denies ocular pain, flashes of light, or floaters OU.         Last edited by Rockie Neighbours, [place] on 04/02/2020  9:55 AM. (History)      Referring physician: Prince Solian, MD [place],   [id number]  HISTORICAL INFORMATION:   Selected notes from the MEDICAL RECORD NUMBER    Lab Results  Component Value Date   HGBA1C 5.7 01/30/2010     CURRENT MEDICATIONS: Current Outpatient Medications (Ophthalmic Drugs)  Medication Sig  . bimatoprost (LUMIGAN) 0.01 % SOLN INSTILL ONE DROP IN [facility] EYE AT BEDTIME  . timolol (BETIMOL) 0.5 % ophthalmic solution Place 1 drop into the right eye daily.   No current facility-administered medications for this visit. (Ophthalmic Drugs)   Current Outpatient Medications (Other)  Medication Sig  . allopurinol (ZYLOPRIM) 300 MG tablet Take 300 mg by mouth daily.  Marland Kitchen aspirin 81 MG tablet Take 81 mg by mouth every evening.   Marland Kitchen atorvastatin (LIPITOR) 40 MG tablet Take 40 mg by mouth every evening.   Marland Kitchen CIALIS 20 MG tablet Take 20 mg by mouth daily as needed for erectile dysfunction.   Marland Kitchen escitalopram (LEXAPRO) 20 MG tablet Take 20 mg by mouth every morning.   . fluticasone (FLONASE) 50 MCG/ACT nasal spray FLUTICASONE PROPIONATE 50 MCG/ACT SUSP---  bid prn  . Multiple Vitamins-Minerals (PRESERVISION AREDS 2 PO) Take 1 capsule by mouth 2 (two) times daily.   . Omega-3 Fatty Acids (FISH OIL OMEGA-3 PO) Take by mouth.  . pantoprazole (PROTONIX) 40 MG tablet TAKE ONE TABLET DAILY   No current  facility-administered medications for this visit. (Other)      REVIEW OF SYSTEMS:    ALLERGIES Allergies  Allergen Reactions  . Pollen Extract Cough    "sinus issues"  . Sulfamethoxazole-Trimethoprim Rash    PAST MEDICAL HISTORY Past Medical History:  Diagnosis Date  . Anxiety   . Bladder tumor   . Carpal tunnel syndrome, left    intermittant  . Chronic seasonal allergic rhinitis   . Coronary artery disease    per Dr Aundra Dubin (cardiologist) note --- noted on CT chest coronary calcification -- ETT-Cardiolite normal in 2014  . DDD (degenerative disc disease), cervical   . Dilated cardiomyopathy ([place])   . Diverticulosis of colon   . Fecal incontinence   . GERD (gastroesophageal reflux disease)   . Grade II diastolic dysfunction [id number]   per ECHO   . Hemorrhoids   . Hiatal hernia   . History of adenomatous polyp of colon    2001;  2007;  2012  . History of bladder cancer urologist-  dr Gaynelle Arabian   11-06-2013  s/p TURBT and BCG tx's  . History of kidney stones    about 45 years ago  . History of panic attacks   . History of urethral stricture   . Hyperlipidemia   .  Hypertension   . Hypogonadism in male   . LVH (left ventricular hypertrophy) 06/17/2017   Mild, noted on ECHO  . Nonischemic cardiomyopathy [facility])    cardiologist-  dr Aundra Dubin-- per last echo 01/ 2016 ef 50-55%  (up from 09/ 2014 ef 45-50%)  . OSA on CPAP    since 2014  . Pneumonia 11/2010   Left lower lobe  . Pulmonary hypertension ([place]) 06/17/2017   Mild, per ECHO   Past Surgical History:  Procedure Laterality Date  . CARDIOVASCULAR STRESS TEST  08-21-2013  dr Aundra Dubin   normal nuclear study w/ no ischemia/  normal LV function and wall motion , ef 63%  . CATARACT EXTRACTION W/ INTRAOCULAR LENS  IMPLANT, BILATERAL  2013  . COLONOSCOPY  last one 03-03-2016  . CYSTO/  DIRECT VISION INTERNAL URETEROTOMY  11/25/2006  . LAPAROSCOPIC CHOLECYSTECTOMY  10/15/2006  . ORBITAL FRACTURE SURGERY  1986    left eye:  Jan 1986--- fixation of fractures, repair of optic nerve decompression , and complex closure  . TONSILLECTOMY  child  . TRANSTHORACIC ECHOCARDIOGRAM  12-16-2014   dr Aundra Dubin   grade 1 diastolic dysfunction, ef 99991111 (improved from last study in 2014 , previous ef 45-50%) /  trivial MR and TR/  mild LAE/  inferior vena cava dilated, respirophasic diameter changes were blunted (<50%), consistant w/ elevated central venous pressure  . TRANSURETHRAL RESECTION OF BLADDER TUMOR Bilateral 05/16/2018   Procedure: TRANSURETHRAL RESECTION OF BLADDER TUMOR (TURBT)/ RETROGRADE;  Surgeon: Festus Aloe, MD;  Location: [facility];  Service: Urology;  Laterality: Bilateral;  . TRANSURETHRAL RESECTION OF BLADDER TUMOR WITH GYRUS (TURBT-GYRUS) N/A 11/06/2013   Procedure: TRANSURETHRAL RESECTION OF BLADDER TUMOR WITH GYRUS (TURBT-GYRUS);  Surgeon: Ailene Rud, MD;  Location: [facility];  Service: Urology;  Laterality: N/A;  . TRANSURETHRAL RESECTION OF BLADDER TUMOR WITH MITOMYCIN-C N/A 12/09/2016   Procedure: CYSTOSCOPY TRANSURETHRAL RESECTION OF BLADDER TUMOR WITH MITOMYCIN-C;  Surgeon: Carolan Clines, MD;  Location: [facility];  Service: Urology;  Laterality: N/A;  . VARICOSE VEIN SURGERY  2002  . VASECTOMY      FAMILY HISTORY Family History  Problem Relation Age of Onset  . Heart attack Father 23  . Stroke Father 27  . Heart attack Paternal Grandfather        in 31s  . Prostate cancer Paternal Uncle   . Heart attack Paternal Uncle         > 55  . Heart attack Maternal Uncle 80       > 55  . Colon cancer Neg Hx   . Diabetes Neg Hx     SOCIAL HISTORY Social History   Tobacco Use  . Smoking status: Never Smoker  . Smokeless tobacco: Never Used  Substance Use Topics  . Alcohol use: Yes    Alcohol/week: 0.0 standard drinks    Comment: occasional  . Drug use: No         OPHTHALMIC EXAM:  Base Eye Exam    Visual Acuity (ETDRS)       Right Left   Dist cc 20/25 +2 20/20 -1   Correction: Glasses       Tonometry (Tonopen, 9:59 AM)      Right Left   Pressure 15 16       Pupils      Pupils Dark Light Shape React APD   Right PERRL 4 3 Round Brisk None   Left PERRL 4 3 Round Brisk None  Visual Fields (Counting fingers)      Left Right    Full Full       Extraocular Movement      Right Left    Full Full       Neuro/Psych    Oriented x3: Yes   Mood/Affect: Normal       Dilation    Both eyes: 1.0% Mydriacyl, 2.5% Phenylephrine @ 9:59 AM        Slit Lamp and Fundus Exam    External Exam      Right Left   External Normal Normal       Slit Lamp Exam      Right Left   Lids/Lashes Normal Normal   Conjunctiva/Sclera White and quiet White and quiet   Cornea Clear Clear   Anterior Chamber Deep and quiet Deep and quiet   Iris Round and reactive Round and reactive   Lens Posterior chamber intraocular lens Posterior chamber intraocular lens   Anterior Vitreous Normal Normal       Fundus Exam      Right Left   Posterior Vitreous Posterior vitreous detachment Posterior vitreous detachment   Disc Peripapillary atrophy Peripapillary atrophy   C/D Ratio 0.5 0.7   Macula Normal Normal   Vessels Normal Normal   Periphery Normal Normal          IMAGING AND PROCEDURES  Imaging and Procedures for 04/02/20  Color Fundus Photography Optos - OU - Both Eyes       Right Eye Progression has been stable. Disc findings include increased cup to disc ratio.   Left Eye Progression has been stable. Disc findings include increased cup to disc ratio.   Notes Posterior vitreous detachment right eye,, no retinal holes or tears.  OS, status post vitrectomy for symptomatic visually incapacitating floaters.  Good retinopexy temporally.   attached.  Cupping of the nerve, open angle glaucoma.  Under the care of Dr. Dionne Ano                ASSESSMENT/PLAN:  No problem-specific Assessment & Plan  notes found for this encounter.      ICD-10-CM   1. Myopic macular degeneration  H35.30 Color Fundus Photography Optos - OU - Both Eyes  2. Senile macular retinal degeneration  H35.30   3. Posterior vitreous detachment of right eye  H43.811   4. Early stage nonexudative age-related macular degeneration of both eyes  H35.3131   5. 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  Myopic macular degeneration OU very mild and not progressive with good acuity.  2.  Posterior vitreous detachment of the right eye minor  3.  History of vitrectomy OS for symptomatic visually incapacitating strands and floaters stable 4.  Open-angle glaucoma continue current medications under the care of Dr. Dionne Ano, [place] Ordered this visit:  No orders of the defined types were placed in this encounter.      Return in about 1 year (around 04/02/2021) for DILATE OU, OCT.  There are no Patient Instructions on file for this visit.   Explained the diagnoses, plan, and follow up with the patient and they expressed understanding.  Patient expressed understanding of the importance of proper follow up care.   Clent Demark Tally Mckinnon M.D. Diseases & Surgery of the Retina and Vitreous Retina & Diabetic [employer] 04/02/20     Abbreviations: M myopia (nearsighted); A astigmatism; H hyperopia (farsighted); P  presbyopia; Mrx spectacle prescription;  CTL contact lenses; OD right eye; OS left eye; OU both eyes  XT exotropia; ET esotropia; PEK punctate epithelial keratitis; PEE punctate epithelial erosions; DES dry eye syndrome; MGD meibomian gland dysfunction; ATs artificial tears; PFAT's preservative free artificial tears; Bee nuclear sclerotic cataract; PSC posterior subcapsular cataract; ERM epi-retinal membrane; PVD posterior vitreous detachment; RD retinal detachment; DM diabetes mellitus; DR diabetic retinopathy; NPDR non-proliferative diabetic retinopathy; PDR  proliferative diabetic retinopathy; CSME clinically significant macular edema; DME diabetic macular edema; dbh dot blot hemorrhages; CWS cotton wool spot; POAG primary open angle glaucoma; C/D cup-to-disc ratio; HVF humphrey visual field; GVF goldmann visual field; OCT optical coherence tomography; IOP intraocular pressure; BRVO Branch retinal vein occlusion; CRVO central retinal vein occlusion; CRAO central retinal artery occlusion; BRAO branch retinal artery occlusion; RT retinal tear; SB scleral buckle; PPV pars plana vitrectomy; VH Vitreous hemorrhage; PRP panretinal laser photocoagulation; IVK intravitreal kenalog; VMT vitreomacular traction; MH Macular hole;  NVD neovascularization of the disc; NVE neovascularization elsewhere; AREDS age related eye disease study; ARMD age related macular degeneration; POAG primary open angle glaucoma; EBMD epithelial/anterior basement membrane dystrophy; ACIOL anterior chamber intraocular lens; IOL intraocular lens; PCIOL posterior chamber intraocular lens; Phaco/IOL phacoemulsification with intraocular lens placement; [place] photorefractive keratectomy; LASIK laser assisted in situ keratomileusis; HTN hypertension; DM diabetes mellitus; COPD chronic obstructive pulmonary disease

## 2020-04-03 DIAGNOSIS — M545 Low back pain: Secondary | ICD-10-CM | POA: Diagnosis not present

## 2020-04-09 DIAGNOSIS — E291 Testicular hypofunction: Secondary | ICD-10-CM | POA: Diagnosis not present

## 2020-04-22 DIAGNOSIS — G4733 Obstructive sleep apnea (adult) (pediatric): Secondary | ICD-10-CM | POA: Diagnosis not present

## 2020-04-23 DIAGNOSIS — E291 Testicular hypofunction: Secondary | ICD-10-CM | POA: Diagnosis not present

## 2020-04-28 DIAGNOSIS — G4733 Obstructive sleep apnea (adult) (pediatric): Secondary | ICD-10-CM | POA: Diagnosis not present

## 2020-04-28 DIAGNOSIS — E291 Testicular hypofunction: Secondary | ICD-10-CM | POA: Diagnosis not present

## 2020-04-28 DIAGNOSIS — Z8551 Personal history of malignant neoplasm of bladder: Secondary | ICD-10-CM | POA: Diagnosis not present

## 2020-04-28 DIAGNOSIS — N400 Enlarged prostate without lower urinary tract symptoms: Secondary | ICD-10-CM | POA: Diagnosis not present

## 2020-04-30 ENCOUNTER — Telehealth: Payer: Self-pay

## 2020-04-30 ENCOUNTER — Ambulatory Visit: Payer: Self-pay | Admitting: Neurology

## 2020-04-30 NOTE — Telephone Encounter (Signed)
Noted, thank you

## 2020-04-30 NOTE — Telephone Encounter (Signed)
I called pt. No answer, left a message asking pt to call me back.  Pt was advised via vm that we needed to reschedule his 04/30/2020 appt due to Dr. Brett Fairy not feeling well.  This appointment was for initial cpap compliance had he has to be seen before 05/27/2020. I have placed an appointment on hold for 6/24 at 830 am. If pt calls back please offer him this appt time first.

## 2020-04-30 NOTE — Telephone Encounter (Signed)
Phone rep checked office voicemail's, @ 1:20 pt left a message to r/s his initialCPAP f/u. Pt has been r/s for 06-30 with Dr Brett Fairy

## 2020-05-14 DIAGNOSIS — E291 Testicular hypofunction: Secondary | ICD-10-CM | POA: Diagnosis not present

## 2020-05-15 DIAGNOSIS — L570 Actinic keratosis: Secondary | ICD-10-CM | POA: Diagnosis not present

## 2020-05-15 DIAGNOSIS — D1801 Hemangioma of skin and subcutaneous tissue: Secondary | ICD-10-CM | POA: Diagnosis not present

## 2020-05-15 DIAGNOSIS — L718 Other rosacea: Secondary | ICD-10-CM | POA: Diagnosis not present

## 2020-05-15 DIAGNOSIS — L812 Freckles: Secondary | ICD-10-CM | POA: Diagnosis not present

## 2020-05-15 DIAGNOSIS — L821 Other seborrheic keratosis: Secondary | ICD-10-CM | POA: Diagnosis not present

## 2020-05-21 ENCOUNTER — Ambulatory Visit: Payer: PPO | Admitting: Neurology

## 2020-05-21 ENCOUNTER — Encounter: Payer: Self-pay | Admitting: Neurology

## 2020-05-21 VITALS — BP 127/79 | HR 81 | Ht 75.5 in | Wt 262.0 lb

## 2020-05-21 DIAGNOSIS — G4733 Obstructive sleep apnea (adult) (pediatric): Secondary | ICD-10-CM | POA: Diagnosis not present

## 2020-05-21 DIAGNOSIS — I420 Dilated cardiomyopathy: Secondary | ICD-10-CM

## 2020-05-21 DIAGNOSIS — Z9989 Dependence on other enabling machines and devices: Secondary | ICD-10-CM

## 2020-05-21 DIAGNOSIS — C670 Malignant neoplasm of trigone of bladder: Secondary | ICD-10-CM

## 2020-05-21 DIAGNOSIS — G4730 Sleep apnea, unspecified: Secondary | ICD-10-CM | POA: Diagnosis not present

## 2020-05-21 NOTE — Progress Notes (Signed)
[facility]   Provider:  Larey Seat, [place] D  Referring Provider: Prince Solian, MD Primary Care Physician:  Prince Solian, MD  Chief Complaint  Patient presents with   Follow-up    pt alone, rm 10. presents for follow up visit from his new set up on machine. DME [facility] states machine works well. no concerns    HPI:  Brian Cortez is a 68 y.o. male seen on 05-21-2020.  Brian Cortez was in need of a new CPAP machine he was evaluated for his current baseline by home sleep test performed on January 23, 2020.  He has been followed here for CPAP compliance for over 5 years.  Fully vaccinated, no nocturia, treated for bladder cancer.  Surprisingly mild obstructive sleep apnea was this time found at an AHI of 9.4/h but strongly REM dependent.  During REM sleep his AHI was 20.6 associated with moderate snoring there was also a bradycardia trend and prolonged hypoxemic intervals.  My recommendation was to start a new CPAP machine with auto titration measures as hypoxemia and REM dependence cannot be addressed in a dental device for inspire device.  The patient has been 100% compliant with his new device a minimum pressure of 5 maximum pressure of 15 cmH2O with 3 cm EPR the AHI was 0.8/h which is an excellent resolution of apnea.  This is a 90% reduction of his apnea count in comparison to his new baseline.  He has mild to moderate air leakage his pressure at the 95th percentile is 11.3 cmH2O and well within the current set limits.  I do not need to make any changes unless the patient has any discomfort with the interface.  Otherwise I encouraged him to continue his compliant use of CPAP. He reports his water reservoir is almost empty after a night of use.  He no longer uses an ozone cleaner - not ever since he has a new machine.  The patient endorsed the Epworth sleepiness score today at 3 points only the fatigue severity score at 17 points,  the geriatric depression score at 0 points. He continues to play golf he is watching out to prevent dehydration.     12-19-2019, he follows Dr. Dagmar Hait for PCP.  He follows for CPAP compliance. His machine is now 69 years old and he needs a new one. He has not contracted Covid to his knowledge , he got the first shot last Thursday.  His diagnosis is OSA- allergic rhinitis - obesity- pneumonia- he no longer has nocturia.    Last seen here as a revisit on 09-20-2018, At the pleasure of meeting today with Brian Cortez, and meanwhile retired 69 year old Caucasian married male patient who has been followed here for CPAP needs since 2013 of 14.  He continues to be highly compliant CPAP patient, his average use of time of 7 hours 7 minutes for this 30-day.  And his compliance was 87%, residual AHi at 1.5/h. Uses an S9 Autoset , set at 8 cm , 2 cm EPR.  He was not able to use his CPAP on 5 days due to illness, he has learned of a return of his bladder cancer by cystoscopy- Biopsy Dr. Lawerance Bach - and needed additional work-up and initiation of chemotherapy.  09-14-2017,Brian Cortez is seen here today for a yearly revisit. He has been very compliant CPAP user with 5% compliance averaging 8 hours and 23 minutes at night set pressure is 8 cm  water with 27 cm EPR, residual AHI is 0.8 he does not have major air leaks in the low residual AHI is equally distributed between central and obstructive events. He has lost weight visibly. He endorsed the Epworth sleepiness score at 2 points the fatigue severity score at 11 points the geriatric depression score at 1 out of 15 points. He is retired, he travels and enjoys golf. He is taking Administrator.    Fatigue and lightheadedness were improved, until last month. He has no longer nocturia since being on CPAP. In 2016 he went to the ED and was diagnosed with diastolic heart failure, had a nuclear test with Dr. Loralie Champagne - improved on Coreg.  His medical history  has changed drastically over the last 12 month, he suffered a congestive heart failure episode and has been controlled by Coreg. His bladder cancer was discovered in Dec 2014. Underwent BCG treatment. Has a 30% re-occurrence risk. He had pneumonia and changed PCPs. The data collection began on 07-28-14 and ended on 08-26-14 CPAP is used at 8 cm water pressure with 2 cm EPR the residual AHI is 1.6 which is excellent,  the patient uses a machine on average 8 hours and 24 minutes at night and has 100% compliance for nightly use  over 4 hours. Median daily usage 8 hours 11 minutes.  Interval history history from 09-09-15, the patient's 100% compliant for the number of 30 days and each of these days over 4 hours of consecutive use has been registered. On average he uses his machine for 8 hours and 47 minutes set pressure is 8 cm water with 2 cm expiratory pressure relief ( EPR) the residual AHI is 1.4 he has minimal air leaks, his Epworth sleepiness score today is 1. fatigue severity 6 points and the geriatric depression score is endorsed at one point.  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 When I saw him last year he had been suffering from pneumonia and his primary care physician, Dr. Elsworth Soho, had treated him. The patient is a bladder cancer survivor and has been diagnosed with diastolic heart failure. His cardiac function had improved on Coreg. I have followed him for obstructive sleep apnea and he is a CPAP compliant patient with 100% of use of his CPAP at average use of 8 hours and 54 minutes nightly. CPAP is set at 8 cm water pressure this 2 cm EPR and a residual AHI of 1.4. Minor air leaks are noted, these have no clinical bearing.    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  Sleeping better, SOB today, coughing. has ciliary injection, hay fever.  Severe  allergic rhinitis. Obese. Has not had recurrence of Nocturia. Hot flushes after d/c testosterone supplements.  How likely are you to doze in the following situations: 0 = not likely, 1 = slight chance, 2 = moderate chance, 3 = high chance  Sitting and Reading? 1 Watching Television?1 Sitting inactive in a public place (theater or meeting)? Lying down in the afternoon when circumstances permit?1 Sitting and talking to someone? Sitting quietly after lunch without alcohol?  In a car, while stopped for a few minutes in traffic? As a passenger in a car for  an hour without a break?  Total =from previously 10 /24 now to 3/ 24 points.   FSS 15/ 63     He is finished  6 chemotherapy rounds in 2020.  Bladder cancer had returned, this has affected his fatigue which was endorsed at 35 out of 63 possible points, but he has not had excessive daytime sleepiness and endorsed only 2 out of 24 points.   He also is not clinically depressed geriatric depression score endorsed at 2 out of 15 points.   Social History   Socioeconomic History   Marital status: Married    Spouse name: Brian Cortez   Number of children: 2   Years of education: Social worker   Highest education level: Not on file  Occupational History   Occupation: [employer]    Employer: [employer]   Occupation: PRESIDENT    Employer: [employer]  Tobacco Use   Smoking status: Never Smoker   Smokeless tobacco: Never Used  Scientific laboratory technician Use: Never used  Substance and Sexual Activity   Alcohol use: Yes    Alcohol/week: 0.0 standard drinks    Comment: occasional   Drug use: No   Sexual activity: Not on file    Comment: VASECTOMY  Other Topics Concern   Not on file  Social History Narrative   Patient lives at home with spouse.    Caffeine Use: 3-4 cups    Social Determinants of Health   Financial Resource Strain:    Difficulty of Paying Living Expenses:   Food Insecurity:    Worried About Charity fundraiser in the Last Year:    Arboriculturist in the Last Year:   Transportation Needs:    Film/video editor (Medical):    Lack of Transportation (Non-Medical):   Physical Activity:    Days of Exercise per Week:    Minutes of Exercise per Session:   Stress:    Feeling of Stress :   Social Connections:    Frequency of Communication with Friends and Family:    Frequency of Social Gatherings with Friends and Family:    Attends Religious Services:    Active Member of Clubs or Organizations:    Attends Music therapist:    Marital Status:   Intimate Partner Violence:    Fear of Current or Ex-Partner:    Emotionally Abused:    Physically Abused:    Sexually Abused:     Family History  Problem Relation Age of Onset   Heart attack Father 56   Stroke Father 59   Heart attack Paternal Grandfather        in 24s   Prostate cancer Paternal Uncle    Heart attack Paternal Uncle         > 57   Heart attack Maternal Uncle 80       > 55   Colon cancer Neg Hx    Diabetes Neg Hx     Past Medical History:  Diagnosis Date   Anxiety    Bladder tumor    Carpal tunnel syndrome, left    intermittant   Chronic seasonal allergic rhinitis    Coronary artery disease    per Dr Aundra Dubin (cardiologist) note --- noted on CT chest coronary calcification -- ETT-Cardiolite normal in 2014   DDD (degenerative disc disease), cervical    Dilated cardiomyopathy ([place])    Diverticulosis of colon    Fecal incontinence    GERD (gastroesophageal reflux disease)  Grade II diastolic dysfunction 26/94/8546   per ECHO    Hemorrhoids    Hiatal hernia    History of adenomatous polyp of colon    2001;  2007;  2012   History of bladder cancer urologist-  dr Gaynelle Arabian   11-06-2013  s/p TURBT and BCG  tx's   History of kidney stones    about 45 years ago   History of panic attacks    History of urethral stricture    Hyperlipidemia    Hypertension    Hypogonadism in male    LVH (left ventricular hypertrophy) 06/17/2017   Mild, noted on ECHO   Nonischemic cardiomyopathy [facility])    cardiologist-  dr Aundra Dubin-- per last echo 01/ 2016 ef 50-55%  (up from 09/ 2014 ef 45-50%)   OSA on CPAP    since 2014   Pneumonia 11/2010   Left lower lobe   Pulmonary hypertension ([place]) 06/17/2017   Mild, per ECHO    Past Surgical History:  Procedure Laterality Date   CARDIOVASCULAR STRESS TEST  08-21-2013  dr Aundra Dubin   normal nuclear study w/ no ischemia/  normal LV function and wall motion , ef 63%   CATARACT EXTRACTION W/ INTRAOCULAR LENS  IMPLANT, BILATERAL  2013   COLONOSCOPY  last one 03-03-2016   CYSTO/  DIRECT VISION INTERNAL URETEROTOMY  11/25/2006   LAPAROSCOPIC CHOLECYSTECTOMY  10/15/2006   ORBITAL FRACTURE SURGERY  1986   left eye:  Jan 1986--- fixation of fractures, repair of optic nerve decompression , and complex closure   TONSILLECTOMY  child   TRANSTHORACIC ECHOCARDIOGRAM  12-16-2014   dr Aundra Dubin   grade 1 diastolic dysfunction, ef 27-03% (improved from last study in 2014 , previous ef 45-50%) /  trivial MR and TR/  mild LAE/  inferior vena cava dilated, respirophasic diameter changes were blunted (<50%), consistant w/ elevated central venous pressure   TRANSURETHRAL RESECTION OF BLADDER TUMOR Bilateral 05/16/2018   Procedure: TRANSURETHRAL RESECTION OF BLADDER TUMOR (TURBT)/ RETROGRADE;  Surgeon: Festus Aloe, MD;  Location: [facility];  Service: Urology;  Laterality: Bilateral;   TRANSURETHRAL RESECTION OF BLADDER TUMOR WITH GYRUS (TURBT-GYRUS) N/A 11/06/2013   Procedure: TRANSURETHRAL RESECTION OF BLADDER TUMOR WITH GYRUS (TURBT-GYRUS);  Surgeon: Ailene Rud, MD;  Location: [facility];  Service: Urology;  Laterality: N/A;   TRANSURETHRAL  RESECTION OF BLADDER TUMOR WITH MITOMYCIN-C N/A 12/09/2016   Procedure: CYSTOSCOPY TRANSURETHRAL RESECTION OF BLADDER TUMOR WITH MITOMYCIN-C;  Surgeon: Carolan Clines, MD;  Location: [facility];  Service: Urology;  Laterality: N/A;   VARICOSE VEIN SURGERY  2002   VASECTOMY      Current Outpatient Medications  Medication Sig Dispense Refill   allopurinol (ZYLOPRIM) 300 MG tablet Take 300 mg by mouth daily.     aspirin 81 MG tablet Take 81 mg by mouth every evening.      atorvastatin (LIPITOR) 40 MG tablet Take 40 mg by mouth every evening.      bimatoprost (LUMIGAN) 0.01 % SOLN INSTILL ONE DROP IN EACH EYE AT BEDTIME     escitalopram (LEXAPRO) 20 MG tablet Take 20 mg by mouth every morning.      fluticasone (FLONASE) 50 MCG/ACT nasal spray FLUTICASONE PROPIONATE 50 MCG/ACT SUSP---  bid prn     Multiple Vitamins-Minerals (PRESERVISION AREDS 2 PO) Take 1 capsule by mouth 2 (two) times daily.      Omega-3 Fatty Acids (FISH OIL OMEGA-3 PO) Take by mouth.     pantoprazole (PROTONIX) 40  MG tablet TAKE ONE TABLET DAILY 90 tablet 3   timolol (BETIMOL) 0.5 % ophthalmic solution Place 1 drop into the right eye daily.     No current facility-administered medications for this visit.    Allergies as of 05/21/2020 - Review Complete 05/21/2020  Allergen Reaction Noted   Pollen extract Cough 01/13/2013   Sulfamethoxazole-trimethoprim Rash 11/20/2018    Vitals: BP 127/79    Pulse 81    Ht 6' 3.5" (1.918 m)    Wt 262 lb (118.8 kg)    BMI 32.32 kg/m  Last Weight:  Wt Readings from Last 1 Encounters:  05/21/20 262 lb (118.8 kg)   Last Height:   Ht Readings from Last 1 Encounters:  05/21/20 6' 3.5" (1.918 m)     General: The patient is awake, alert and appears  in acute distress from hay fever and sinusitis. Nasal congestion is noted.  The patient is well groomed. He has visible pressure marks.  Head: Normocephalic, atraumatic. Neck is supple. 19- inches    Circumference, from 21" .   Mallampati 3 plus - large neck, small opening   Sinus congestion.  Retrognathia is noted .  Cardiovascular: Regular rate and rhythm, without murmurs or carotid bruit, and without distended neck veins. Respiratory: Lungs are clear to auscultation. He has faster RR 23- min.  Skin:   Ankle edema, affecting the ankle sensation for vibration.  Varicose veins visible in both lower extremities.  Trunk: BMI reduced to 32.32,  he has an erect posture.  Neurologic exam : The patient is awake and alert, oriented to place and time.   Memory subjective  described as intact.  Cranial nerves: no loss of smell or taste- Pupils are equal and briskly reactive to light.  Fully vaccination.  Nystagmus with gaze to the left , unrestricted visual field.  Facial motor strength is symmetric and his tongue and uvula move midline. Hearing intact.  DTR intact, symmetrical. Babinski downgoing.   Assessment:  After physical and neurologic examination, review of laboratory studies, imaging, neurophysiology testing and pre-existing records, assessment will be reviewed on the problem list:  OSA, obesity, cardio myopathy, repeated infections- Sinusitis, bronchitis, Pneumonia.   Plan:  OSA - 100% compliance for the last 5 years, on CPAP well controlled.  Now on new machine same reciprocal results.  He will change to [facility], now bought up by [facility].   Dr Radford Pax is following orthostaic hypotension, HTN and cardiomyopathy.    Larey Seat, MD     Dr Dagmar Hait.

## 2020-05-21 NOTE — Patient Instructions (Signed)

## 2020-06-09 DIAGNOSIS — H353132 Nonexudative age-related macular degeneration, bilateral, intermediate dry stage: Secondary | ICD-10-CM | POA: Diagnosis not present

## 2020-06-09 DIAGNOSIS — H31002 Unspecified chorioretinal scars, left eye: Secondary | ICD-10-CM | POA: Diagnosis not present

## 2020-06-09 DIAGNOSIS — H401132 Primary open-angle glaucoma, bilateral, moderate stage: Secondary | ICD-10-CM | POA: Diagnosis not present

## 2020-06-09 DIAGNOSIS — H52203 Unspecified astigmatism, bilateral: Secondary | ICD-10-CM | POA: Diagnosis not present

## 2020-06-11 DIAGNOSIS — E291 Testicular hypofunction: Secondary | ICD-10-CM | POA: Diagnosis not present

## 2020-06-11 DIAGNOSIS — D751 Secondary polycythemia: Secondary | ICD-10-CM | POA: Diagnosis not present

## 2020-06-23 DIAGNOSIS — H401132 Primary open-angle glaucoma, bilateral, moderate stage: Secondary | ICD-10-CM | POA: Diagnosis not present

## 2020-06-25 DIAGNOSIS — E291 Testicular hypofunction: Secondary | ICD-10-CM | POA: Diagnosis not present

## 2020-06-30 ENCOUNTER — Other Ambulatory Visit: Payer: Self-pay

## 2020-06-30 ENCOUNTER — Encounter: Payer: Self-pay | Admitting: Cardiology

## 2020-06-30 ENCOUNTER — Ambulatory Visit: Payer: PPO | Admitting: Cardiology

## 2020-06-30 VITALS — BP 130/84 | HR 65 | Ht 76.0 in | Wt 270.8 lb

## 2020-06-30 DIAGNOSIS — E782 Mixed hyperlipidemia: Secondary | ICD-10-CM | POA: Diagnosis not present

## 2020-06-30 DIAGNOSIS — I2510 Atherosclerotic heart disease of native coronary artery without angina pectoris: Secondary | ICD-10-CM

## 2020-06-30 DIAGNOSIS — I10 Essential (primary) hypertension: Secondary | ICD-10-CM

## 2020-06-30 DIAGNOSIS — I420 Dilated cardiomyopathy: Secondary | ICD-10-CM

## 2020-06-30 NOTE — Progress Notes (Addendum)
06/30/2020 Early [name]   1951/06/20  [id number]  Primary Physician Prince Solian, MD Primary Cardiologist: Dr. Radford Pax Electrophysiologist: None   Reason for Visit/CC: f/u for CAD  HPI:  Brian Cortez is a 69 y.o. male with a hx of hyperlipidemia,HTN, DCM with priorEF 45-50% with mild global hypokinesis,coronary artery calcification on a chest CT withnormal nuclear stress test andEF was 63% on the Cardiolite (contrasting with the echo). He also has a history ofbladder cancer and had resection and BCG treatment.  He is here today for followup and is doing well.  He denies any chest pain or pressure, SOB, DOE, PND, orthopnea, LE edema, dizziness, palpitations or syncope. He is compliant with his meds and is tolerating meds with no SE.    Current Meds  Medication Sig  . allopurinol (ZYLOPRIM) 300 MG tablet Take 300 mg by mouth daily.  Marland Kitchen aspirin 81 MG tablet Take 81 mg by mouth every evening.   Marland Kitchen atorvastatin (LIPITOR) 40 MG tablet Take 40 mg by mouth every evening.   . bimatoprost (LUMIGAN) 0.01 % SOLN INSTILL ONE DROP IN EACH EYE AT BEDTIME  . escitalopram (LEXAPRO) 20 MG tablet Take 20 mg by mouth every morning.   . fluticasone (FLONASE) 50 MCG/ACT nasal spray FLUTICASONE PROPIONATE 50 MCG/ACT SUSP---  bid prn  . Multiple Vitamins-Minerals (PRESERVISION AREDS 2 PO) Take 1 capsule by mouth 2 (two) times daily.   . Omega-3 Fatty Acids (FISH OIL OMEGA-3 PO) Take by mouth.  . pantoprazole (PROTONIX) 40 MG tablet TAKE ONE TABLET DAILY  . timolol (BETIMOL) 0.5 % ophthalmic solution Place 1 drop into the right eye daily.   Allergies  Allergen Reactions  . Pollen Extract Cough    "sinus issues"  . Sulfamethoxazole-Trimethoprim Rash   Past Medical History:  Diagnosis Date  . Anxiety   . Bladder tumor   . Carpal tunnel syndrome, left    intermittant  . Chronic seasonal allergic rhinitis   . Coronary artery disease    per Dr Aundra Dubin (cardiologist) note --- noted on CT  chest coronary calcification -- ETT-Cardiolite normal in 2014  . DDD (degenerative disc disease), cervical   . Dilated cardiomyopathy ([address])   . Diverticulosis of colon   . Fecal incontinence   . GERD (gastroesophageal reflux disease)   . Grade II diastolic dysfunction 73/53/2992   per ECHO   . Hemorrhoids   . Hiatal hernia   . History of adenomatous polyp of colon    2001;  2007;  2012  . History of bladder cancer urologist-  dr Gaynelle Arabian   11-06-2013  s/p TURBT and BCG tx's  . History of kidney stones    about 45 years ago  . History of panic attacks   . History of urethral stricture   . Hyperlipidemia   . Hypertension   . Hypogonadism in male   . LVH (left ventricular hypertrophy) 06/17/2017   Mild, noted on ECHO  . Nonischemic cardiomyopathy [facility])    cardiologist-  dr Aundra Dubin-- per last echo 01/ 2016 ef 50-55%  (up from 09/ 2014 ef 45-50%)  . OSA on CPAP    since 2014  . Pneumonia 11/2010   Left lower lobe  . Pulmonary hypertension ([place]) 06/17/2017   Mild, per ECHO   Family History  Problem Relation Age of Onset  . Heart attack Father 23  . Stroke Father 59  . Heart attack Paternal Grandfather        in 23s  . Prostate cancer  Paternal Uncle   . Heart attack Paternal Uncle         > 55  . Heart attack Maternal Uncle 80       > 55  . Colon cancer Neg Hx   . Diabetes Neg Hx    Past Surgical History:  Procedure Laterality Date  . CARDIOVASCULAR STRESS TEST  08-21-2013  dr Aundra Dubin   normal nuclear study w/ no ischemia/  normal LV function and wall motion , ef 63%  . CATARACT EXTRACTION W/ INTRAOCULAR LENS  IMPLANT, BILATERAL  2013  . COLONOSCOPY  last one 03-03-2016  . CYSTO/  DIRECT VISION INTERNAL URETEROTOMY  11/25/2006  . LAPAROSCOPIC CHOLECYSTECTOMY  10/15/2006  . ORBITAL FRACTURE SURGERY  1986   left eye:  Jan 1986--- fixation of fractures, repair of optic nerve decompression , and complex closure  . TONSILLECTOMY  child  . TRANSTHORACIC ECHOCARDIOGRAM   12-16-2014   dr Aundra Dubin   grade 1 diastolic dysfunction, ef 90-30% (improved from last study in 2014 , previous ef 45-50%) /  trivial MR and TR/  mild LAE/  inferior vena cava dilated, respirophasic diameter changes were blunted (<50%), consistant w/ elevated central venous pressure  . TRANSURETHRAL RESECTION OF BLADDER TUMOR Bilateral 05/16/2018   Procedure: TRANSURETHRAL RESECTION OF BLADDER TUMOR (TURBT)/ RETROGRADE;  Surgeon: Festus Aloe, MD;  Location: [facility];  Service: Urology;  Laterality: Bilateral;  . TRANSURETHRAL RESECTION OF BLADDER TUMOR WITH GYRUS (TURBT-GYRUS) N/A 11/06/2013   Procedure: TRANSURETHRAL RESECTION OF BLADDER TUMOR WITH GYRUS (TURBT-GYRUS);  Surgeon: Ailene Rud, MD;  [facility];  Service: Urology;  Laterality: N/A;  . TRANSURETHRAL RESECTION OF BLADDER TUMOR WITH MITOMYCIN-C N/A 12/09/2016   Procedure: CYSTOSCOPY TRANSURETHRAL RESECTION OF BLADDER TUMOR WITH MITOMYCIN-C;  Surgeon: Carolan Clines, MD;  Location: [facility];  Service: Urology;  Laterality: N/A;  . VARICOSE VEIN SURGERY  2002  . VASECTOMY     Social History   Socioeconomic History  . Marital status: Married    Spouse name: Pamala Hurry  . Number of children: 2  . Years of education: Post Grad  . Highest education level: Not on file  Occupational History  . Occupation: BANK -PRESIDENT    Employer: [employer]  . Occupation: PRESIDENT    Employer: [employer]  Tobacco Use  . Smoking status: Never Smoker  . Smokeless tobacco: Never Used  Vaping Use  . Vaping Use: Never used  Substance and Sexual Activity  . Alcohol use: Yes    Alcohol/week: 0.0 standard drinks    Comment: occasional  . Drug use: No  . Sexual activity: Not on file    Comment: VASECTOMY  Other Topics Concern  . Not on file  Social History Narrative   Patient lives at home with spouse.    Caffeine Use: 3-4 cups   Social Determinants of Health   Financial Resource  Strain:   . Difficulty of Paying Living Expenses:   Food Insecurity:   . Worried About Charity fundraiser in the Last Year:   . Arboriculturist in the Last Year:   Transportation Needs:   . Film/video editor (Medical):   Marland Kitchen Lack of Transportation (Non-Medical):   Physical Activity:   . Days of Exercise per Week:   . Minutes of Exercise per Session:   Stress:   . Feeling of Stress :   Social Connections:   . Frequency of Communication with Friends and Family:   . Frequency  of Social Gatherings with Friends and Family:   . Attends Religious Services:   . Active Member of Clubs or Organizations:   . Attends Archivist Meetings:   Marland Kitchen Marital Status:   Intimate Partner Violence:   . Fear of Current or Ex-Partner:   . Emotionally Abused:   Marland Kitchen Physically Abused:   . Sexually Abused:      Lipid Panel     Component Value Date/Time   CHOL 130 09/11/2014 0738   TRIG 285.0 (H) 09/11/2014 0738   HDL 34.40 (L) 09/11/2014 0738   CHOLHDL 4 09/11/2014 0738   VLDL 57.0 (H) 09/11/2014 0738   LDLCALC 42 10/16/2013 0823   LDLDIRECT 67.7 09/11/2014 0738    Review of Systems: General: negative for chills, fever, night sweats or weight changes.  Cardiovascular: negative for chest pain, dyspnea on exertion, edema, orthopnea, palpitations, paroxysmal nocturnal dyspnea or shortness of breath Dermatological: negative for rash Respiratory: negative for cough or wheezing Urologic: negative for hematuria Abdominal: negative for nausea, vomiting, diarrhea, bright red blood per rectum, melena, or hematemesis Neurologic: negative for visual changes, syncope, or dizziness All other systems reviewed and are otherwise negative except as noted above.   Physical Exam:  GEN: Well nourished, well developed in no acute distress HEENT: Normal NECK: No JVD; No carotid bruits LYMPHATICS: No lymphadenopathy CARDIAC:RRR, no murmurs, rubs, gallops RESPIRATORY:  Clear to auscultation without  rales, wheezing or rhonchi  ABDOMEN: Soft, non-tender, non-distended MUSCULOSKELETAL:  No edema; No deformity  SKIN: varicose veins in LLE NEUROLOGIC:  Alert and oriented x 3 PSYCHIATRIC:  Normal affect   EKG today showed NSR with LVH by voltage with repol abnormality  ASSESSMENT AND PLAN:   1. Coronary Artery Calcifications on Chest CT:  -had NST that was negative for ischemia.  -Has been treated medically. He denies angina.  -denies any anginal sx -continue ASA and statin   2. Dilated Cardiomyopathy:  -EF improved on most recent studies.  -reviously 45-50%, but normal at 60-65% on last study in 2019.  -Continue losartan.  -no BB due to bradycardia  3. HTN:  -BP controlled on exam -has not required any antihypertensive therapy  4. HLD/Hypertriglyceridemia - LDL goal < 70 given coronary calcifications.  -He is on statin therapy w/ Lipitor 40 mg.  -LDL was 53 and TAG 295 in April -PCP follows lipids.  -He is on fish oil and has since reduced his ETOH intake  -repeat FLP and if TAGS are still high then consider addition of Vascepa or fenofibrate    Marcelo Baldy, MHS [facility] 06/30/2020 3:37 PM

## 2020-06-30 NOTE — Addendum Note (Signed)
Addended by: Antonieta Iba on: 06/30/2020 03:47 PM   Modules accepted: Orders

## 2020-06-30 NOTE — Patient Instructions (Signed)
Medication Instructions:  Your physician recommends that you continue on your current medications as directed. Please refer to the Current Medication list given to you today.  *If you need a refill on your cardiac medications before your next appointment, please call your pharmacy*   Lab Work: Fasting lipids and ALT on Wednesday 08/11 anytime between 7:30am and 4:30pm  If you have labs (blood work) drawn today and your tests are completely normal, you will receive your results only by: Marland Kitchen MyChart Message (if you have MyChart) OR . A paper copy in the mail If you have any lab test that is abnormal or we need to change your treatment, we will call you to review the results.   Follow-Up: At [facility], you and your health needs are our priority.  As part of our continuing mission to provide you with exceptional heart care, we have created designated Provider Care Teams.  These Care Teams include your primary Cardiologist (physician) and Advanced Practice Providers (APPs -  Physician Assistants and Nurse Practitioners) who all work together to provide you with the care you need, when you need it.   Your next appointment:   1 year(s)  The format for your next appointment:   In Person  Provider:   You may see Fransico Him, MD or one of the following Advanced Practice Providers on your designated Care Team:    Melina Copa, PA-C  Ermalinda Barrios, PA-C

## 2020-07-02 ENCOUNTER — Other Ambulatory Visit: Payer: PPO

## 2020-07-07 ENCOUNTER — Other Ambulatory Visit: Payer: PPO | Admitting: *Deleted

## 2020-07-07 ENCOUNTER — Other Ambulatory Visit: Payer: Self-pay

## 2020-07-07 DIAGNOSIS — I2510 Atherosclerotic heart disease of native coronary artery without angina pectoris: Secondary | ICD-10-CM

## 2020-07-07 DIAGNOSIS — E782 Mixed hyperlipidemia: Secondary | ICD-10-CM | POA: Diagnosis not present

## 2020-07-07 LAB — ALT: ALT: 45 IU/L — ABNORMAL HIGH (ref 0–44)

## 2020-07-07 LAB — LIPID PANEL
Chol/HDL Ratio: 4.4 ratio (ref 0.0–5.0)
Cholesterol, Total: 160 mg/dL (ref 100–199)
HDL: 36 mg/dL — ABNORMAL LOW (ref >39)
LDL Chol Calc (NIH): 61 mg/dL (ref 0–99)
Triglycerides: 413 mg/dL — ABNORMAL HIGH (ref 0–149)
VLDL Cholesterol Cal: 63 mg/dL — ABNORMAL HIGH (ref 5–40)

## 2020-07-08 ENCOUNTER — Telehealth: Payer: Self-pay

## 2020-07-08 ENCOUNTER — Telehealth: Payer: Self-pay | Admitting: Cardiology

## 2020-07-08 DIAGNOSIS — E782 Mixed hyperlipidemia: Secondary | ICD-10-CM

## 2020-07-08 MED ORDER — FENOFIBRATE 150 MG PO CAPS: 150.0000 mg | ORAL_CAPSULE | Freq: Every day | ORAL | 3 refills | Status: DC

## 2020-07-08 MED ORDER — FENOFIBRATE 160 MG PO TABS: 160.0000 mg | ORAL_TABLET | Freq: Every day | ORAL | 3 refills | Status: DC

## 2020-07-08 NOTE — Addendum Note (Signed)
Addended by: Antonieta Iba on: 07/08/2020 02:56 PM   Modules accepted: Orders

## 2020-07-08 NOTE — Telephone Encounter (Signed)
The patient has been notified of the result and verbalized understanding.  All questions (if any) were answered. Antonieta Iba, RN 07/08/2020 9:08 AM  Patient states that he has decreased his alcohol intake and will continue to work on abstaining from it. He will start on fenofibrate 160 mg daily and repeat lab work in 2 months.

## 2020-07-08 NOTE — Telephone Encounter (Signed)
-----   Message from Dorothy Spark, MD sent at 07/07/2020  7:02 PM EDT ----- LDL is at goal, triglycerides remain significantly elevated, I would strongly recommend to abstain from alcohol intake, I would also add fenofibrate 160 mg daily.  Repeat lipids and LFTs in 2 months.

## 2020-07-08 NOTE — Telephone Encounter (Signed)
The patient has been notified of the result and verbalized understanding.  All questions (if any) were answered. Antonieta Iba, RN 07/08/2020 9:10 AM

## 2020-07-08 NOTE — Telephone Encounter (Signed)
Patient is returning call to get lab results. Please call back.

## 2020-07-09 DIAGNOSIS — E291 Testicular hypofunction: Secondary | ICD-10-CM | POA: Diagnosis not present

## 2020-07-22 DIAGNOSIS — Z20828 Contact with and (suspected) exposure to other viral communicable diseases: Secondary | ICD-10-CM | POA: Diagnosis not present

## 2020-07-22 DIAGNOSIS — R05 Cough: Secondary | ICD-10-CM | POA: Diagnosis not present

## 2020-07-22 DIAGNOSIS — G4730 Sleep apnea, unspecified: Secondary | ICD-10-CM | POA: Diagnosis not present

## 2020-07-22 DIAGNOSIS — J189 Pneumonia, unspecified organism: Secondary | ICD-10-CM | POA: Diagnosis not present

## 2020-07-22 DIAGNOSIS — J309 Allergic rhinitis, unspecified: Secondary | ICD-10-CM | POA: Diagnosis not present

## 2020-07-22 DIAGNOSIS — Z1152 Encounter for screening for COVID-19: Secondary | ICD-10-CM | POA: Diagnosis not present

## 2020-07-24 ENCOUNTER — Telehealth: Payer: Self-pay | Admitting: Oncology

## 2020-07-24 ENCOUNTER — Other Ambulatory Visit: Payer: Self-pay | Admitting: Oncology

## 2020-07-24 ENCOUNTER — Encounter: Payer: Self-pay | Admitting: Oncology

## 2020-07-24 DIAGNOSIS — U071 COVID-19: Secondary | ICD-10-CM

## 2020-07-24 DIAGNOSIS — J189 Pneumonia, unspecified organism: Secondary | ICD-10-CM | POA: Diagnosis not present

## 2020-07-24 NOTE — Telephone Encounter (Signed)
I connected by phone with Mr. Clevenger to discuss the potential use of an new treatment for mild to moderate COVID-19 viral infection in non-hospitalized patients.   This patient is a age/sex that meets the FDA criteria for Emergency Use Authorization of casirivimab\imdevimab.  Has a (+) direct SARS-CoV-2 viral test result 1. Has mild or moderate COVID-19  2. Is ? 69 years of age and weighs ? 40 kg 3. Is NOT hospitalized due to COVID-19 4. Is NOT requiring oxygen therapy or requiring an increase in baseline oxygen flow rate due to COVID-19 5. Is within 10 days of symptom onset 6. Has at least one of the high risk factor(s) for progression to severe COVID-19 and/or hospitalization as defined in EUA. ? Specific high risk criteria :obesity, CAD   Symptom onset 07/20/20   I have spoken and communicated the following to the patient or parent/caregiver:   1. FDA has authorized the emergency use of casirivimab\imdevimab for the treatment of mild to moderate COVID-19 in adults and pediatric patients with positive results of direct SARS-CoV-2 viral testing who are 44 years of age and older weighing at least 40 kg, and who are at high risk for progressing to severe COVID-19 and/or hospitalization.   2. The significant known and potential risks and benefits of casirivimab\imdevimab, and the extent to which such potential risks and benefits are unknown.   3. Information on available alternative treatments and the risks and benefits of those alternatives, including clinical trials.   4. Patients treated with casirivimab\imdevimab should continue to self-isolate and use infection control measures (e.g., wear mask, isolate, social distance, avoid sharing personal items, clean and disinfect "high touch" surfaces, and frequent handwashing) according to CDC guidelines.    5. The patient or parent/caregiver has the option to accept or refuse casirivimab\imdevimab .   After reviewing this information with the  patient, The patient agreed to proceed with receiving casirivimab\imdevimab infusion and will be provided a copy of the Fact sheet prior to receiving the infusion.Rulon Abide, AGNP-C [phone] ([place])

## 2020-07-25 ENCOUNTER — Ambulatory Visit (HOSPITAL_COMMUNITY)
Admission: RE | Admit: 2020-07-25 | Discharge: 2020-07-25 | Disposition: A | Discharge status: Home / Self Care | Payer: Medicare Other | Source: Ambulatory Visit | Attending: Pulmonary Disease | Admitting: Pulmonary Disease

## 2020-07-25 DIAGNOSIS — Z23 Encounter for immunization: Secondary | ICD-10-CM | POA: Diagnosis not present

## 2020-07-25 DIAGNOSIS — U071 COVID-19: Secondary | ICD-10-CM | POA: Diagnosis present

## 2020-07-25 MED ORDER — ALBUTEROL SULFATE HFA 108 (90 BASE) MCG/ACT IN AERS: 2.0000 | INHALATION_SPRAY | Freq: Once | RESPIRATORY_TRACT | Status: DC | PRN

## 2020-07-25 MED ORDER — SODIUM CHLORIDE 0.9 % IV SOLN: INTRAVENOUS | Status: DC | PRN

## 2020-07-25 MED ORDER — METHYLPREDNISOLONE SODIUM SUCC 125 MG IJ SOLR: 125.0000 mg | Freq: Once | INTRAMUSCULAR | Status: DC | PRN

## 2020-07-25 MED ORDER — DIPHENHYDRAMINE HCL 50 MG/ML IJ SOLN: 50.0000 mg | Freq: Once | INTRAMUSCULAR | Status: DC | PRN

## 2020-07-25 MED ORDER — FAMOTIDINE IN NACL 20-0.9 MG/50ML-% IV SOLN: 20.0000 mg | Freq: Once | INTRAVENOUS | Status: DC | PRN

## 2020-07-25 MED ORDER — EPINEPHRINE 0.3 MG/0.3ML IJ SOAJ: 0.3000 mg | Freq: Once | INTRAMUSCULAR | Status: DC | PRN

## 2020-07-25 MED ORDER — SODIUM CHLORIDE 0.9 % IV SOLN
1200.0000 mg | Freq: Once | INTRAVENOUS | Status: AC
  Administered 2020-07-25: 1200 mg via INTRAVENOUS
  Filled 2020-07-25: qty 10

## 2020-07-25 NOTE — Progress Notes (Addendum)
  Diagnosis: COVID-19  Physician: Dr. Asencion Noble  Procedure: [facility] Infusion Clinic Med: casirivimab\imdevimab infusion - Provided patient with casirivimab\imdevimab fact sheet for patients, parents and caregivers prior to infusion.  Complications: No immediate complications noted.  Discharge: Discharged home   Gaye Alken 07/25/2020

## 2020-07-25 NOTE — Discharge Instructions (Signed)

## 2020-08-06 DIAGNOSIS — E291 Testicular hypofunction: Secondary | ICD-10-CM | POA: Diagnosis not present

## 2020-08-12 DIAGNOSIS — I10 Essential (primary) hypertension: Secondary | ICD-10-CM | POA: Diagnosis not present

## 2020-08-12 DIAGNOSIS — R7301 Impaired fasting glucose: Secondary | ICD-10-CM | POA: Diagnosis not present

## 2020-08-12 DIAGNOSIS — E669 Obesity, unspecified: Secondary | ICD-10-CM | POA: Diagnosis not present

## 2020-08-12 DIAGNOSIS — G4730 Sleep apnea, unspecified: Secondary | ICD-10-CM | POA: Diagnosis not present

## 2020-08-12 DIAGNOSIS — I2584 Coronary atherosclerosis due to calcified coronary lesion: Secondary | ICD-10-CM | POA: Diagnosis not present

## 2020-08-12 DIAGNOSIS — K219 Gastro-esophageal reflux disease without esophagitis: Secondary | ICD-10-CM | POA: Diagnosis not present

## 2020-08-12 DIAGNOSIS — Z23 Encounter for immunization: Secondary | ICD-10-CM | POA: Diagnosis not present

## 2020-08-12 DIAGNOSIS — E785 Hyperlipidemia, unspecified: Secondary | ICD-10-CM | POA: Diagnosis not present

## 2020-08-12 DIAGNOSIS — E291 Testicular hypofunction: Secondary | ICD-10-CM | POA: Diagnosis not present

## 2020-08-12 DIAGNOSIS — U071 COVID-19: Secondary | ICD-10-CM | POA: Diagnosis not present

## 2020-08-18 DIAGNOSIS — D090 Carcinoma in situ of bladder: Secondary | ICD-10-CM | POA: Diagnosis not present

## 2020-08-18 DIAGNOSIS — N35914 Unspecified anterior urethral stricture, male: Secondary | ICD-10-CM | POA: Diagnosis not present

## 2020-08-18 DIAGNOSIS — C672 Malignant neoplasm of lateral wall of bladder: Secondary | ICD-10-CM | POA: Diagnosis not present

## 2020-08-18 DIAGNOSIS — N323 Diverticulum of bladder: Secondary | ICD-10-CM | POA: Diagnosis not present

## 2020-08-20 DIAGNOSIS — E291 Testicular hypofunction: Secondary | ICD-10-CM | POA: Diagnosis not present

## 2020-08-27 DIAGNOSIS — E291 Testicular hypofunction: Secondary | ICD-10-CM | POA: Diagnosis not present

## 2020-09-01 ENCOUNTER — Other Ambulatory Visit: Payer: PPO

## 2020-09-01 ENCOUNTER — Other Ambulatory Visit: Payer: Self-pay

## 2020-09-01 DIAGNOSIS — E782 Mixed hyperlipidemia: Secondary | ICD-10-CM | POA: Diagnosis not present

## 2020-09-01 LAB — LIPID PANEL
Chol/HDL Ratio: 4.2 ratio (ref 0.0–5.0)
Cholesterol, Total: 137 mg/dL (ref 100–199)
HDL: 33 mg/dL — ABNORMAL LOW (ref >39)
LDL Chol Calc (NIH): 69 mg/dL (ref 0–99)
Triglycerides: 212 mg/dL — ABNORMAL HIGH (ref 0–149)
VLDL Cholesterol Cal: 35 mg/dL (ref 5–40)

## 2020-09-01 LAB — ALT: ALT: 33 IU/L (ref 0–44)

## 2020-09-03 DIAGNOSIS — E291 Testicular hypofunction: Secondary | ICD-10-CM | POA: Diagnosis not present

## 2020-09-05 ENCOUNTER — Other Ambulatory Visit: Payer: Self-pay | Admitting: *Deleted

## 2020-09-05 MED ORDER — FISH OIL 1000 MG PO CAPS: 2.0000 | ORAL_CAPSULE | Freq: Two times a day (BID) | ORAL | 0 refills | Status: DC

## 2020-09-12 DIAGNOSIS — E291 Testicular hypofunction: Secondary | ICD-10-CM | POA: Diagnosis not present

## 2020-09-17 DIAGNOSIS — E291 Testicular hypofunction: Secondary | ICD-10-CM | POA: Diagnosis not present

## 2020-09-23 ENCOUNTER — Encounter: Payer: Self-pay | Admitting: Neurology

## 2020-09-24 DIAGNOSIS — E291 Testicular hypofunction: Secondary | ICD-10-CM | POA: Diagnosis not present

## 2020-10-15 DIAGNOSIS — E291 Testicular hypofunction: Secondary | ICD-10-CM | POA: Diagnosis not present

## 2020-10-20 DIAGNOSIS — M545 Low back pain, unspecified: Secondary | ICD-10-CM | POA: Diagnosis not present

## 2020-10-24 ENCOUNTER — Ambulatory Visit: Payer: PPO | Attending: Internal Medicine

## 2020-10-24 DIAGNOSIS — Z23 Encounter for immunization: Secondary | ICD-10-CM

## 2020-10-24 NOTE — Progress Notes (Signed)
   Covid-19 Vaccination Clinic  Name:  Brian Cortez    MRN: [id number] DOB: May 24, 1951  10/24/2020  [name] was observed post Covid-19 immunization for 15 minutes without incident. He was provided with Vaccine Information Sheet and instruction to access the V-Safe system.   [name] was instructed to call 911 with any severe reactions post vaccine: Marland Kitchen Difficulty breathing  . Swelling of face and throat  . A fast heartbeat  . A bad rash all over body  . Dizziness and weakness   Immunizations Administered    Name Date Dose VIS Date Route   Pfizer COVID-19 Vaccine 10/24/2020  2:04 PM 0.3 mL 09/10/2020 Intramuscular   Manufacturer: [place]   Lot: [id number]   [place]: [id number]

## 2020-10-27 DIAGNOSIS — M1711 Unilateral primary osteoarthritis, right knee: Secondary | ICD-10-CM | POA: Diagnosis not present

## 2020-10-28 DIAGNOSIS — G4733 Obstructive sleep apnea (adult) (pediatric): Secondary | ICD-10-CM | POA: Diagnosis not present

## 2020-11-03 DIAGNOSIS — M25561 Pain in right knee: Secondary | ICD-10-CM | POA: Diagnosis not present

## 2020-11-04 DIAGNOSIS — E291 Testicular hypofunction: Secondary | ICD-10-CM | POA: Diagnosis not present

## 2020-11-05 DIAGNOSIS — E291 Testicular hypofunction: Secondary | ICD-10-CM | POA: Diagnosis not present

## 2020-11-12 DIAGNOSIS — C672 Malignant neoplasm of lateral wall of bladder: Secondary | ICD-10-CM | POA: Diagnosis not present

## 2020-11-12 DIAGNOSIS — N521 Erectile dysfunction due to diseases classified elsewhere: Secondary | ICD-10-CM | POA: Diagnosis not present

## 2020-11-12 DIAGNOSIS — E291 Testicular hypofunction: Secondary | ICD-10-CM | POA: Diagnosis not present

## 2020-11-12 DIAGNOSIS — N35914 Unspecified anterior urethral stricture, male: Secondary | ICD-10-CM | POA: Diagnosis not present

## 2020-11-12 DIAGNOSIS — R7989 Other specified abnormal findings of blood chemistry: Secondary | ICD-10-CM | POA: Diagnosis not present

## 2020-11-13 ENCOUNTER — Telehealth: Payer: Self-pay | Admitting: *Deleted

## 2020-11-13 DIAGNOSIS — M25561 Pain in right knee: Secondary | ICD-10-CM | POA: Diagnosis not present

## 2020-11-13 NOTE — Telephone Encounter (Signed)
   [facility] Pre-operative Risk Assessment    HEARTCARE STAFF: - Please ensure there is not already an duplicate clearance open for this procedure. - Under Visit Info/Reason for Call, type in Other and utilize the format Clearance MM/DD/YY or Clearance TBD. Do not use dashes or single digits. - If request is for dental extraction, please clarify the # of teeth to be extracted.  Request for surgical clearance:  1. What type of surgery is being performed? RIGHT KNEE SCOPE   2. When is this surgery scheduled? 01/07/21   3. What type of clearance is required (medical clearance vs. Pharmacy clearance to hold med vs. Both)? MEDICAL  4. Are there any medications that need to be held prior to surgery and how long? ASA    5. Practice name and name of physician performing surgery? [facility]; DR. Quillian Quince CAFFREY   6. What is the office phone number? [phone]   7.   What is the office fax number? [place].   Anesthesia type (None, local, MAC, general) ? CHOICE   Julaine Hua 11/13/2020, 12:55 PM  _________________________________________________________________   (provider comments below)

## 2020-11-13 NOTE — Telephone Encounter (Signed)
Early [name] 69 year old male is requesting a right knee arthroplasty. He was last seen by you in the clinic on 06/30/2020. During that time he was doing well and had no cardiac complaints.   His PMH includes coronary artery calcification on chest CT (had negative NST), dilated cardiomyopathy (last echo showed EF 60-65%), hypertension, hyperlipidemia, and GERD.  May his aspirin be held prior to the procedure?  Thank you for your help. Please direct response to [facility] preop pool.  Jossie Ng. Andreal Vultaggio NP-C    11/13/2020, 1:07 PM [address] Office [phone] Fax [phone]

## 2020-11-13 NOTE — Telephone Encounter (Signed)
Yes Ok to hold ASA

## 2020-11-17 DIAGNOSIS — H401132 Primary open-angle glaucoma, bilateral, moderate stage: Secondary | ICD-10-CM | POA: Diagnosis not present

## 2020-11-17 NOTE — Telephone Encounter (Signed)
Left VM to request call back.   Per Dr. Mayford Knife okay to hold Aspirin. Need to relay recommendation and to assess for any new or worsening cardiac symptoms prior to clearance.  Alver Sorrow, NP

## 2020-11-17 NOTE — Telephone Encounter (Signed)
Encounter not needed

## 2020-11-17 NOTE — Telephone Encounter (Signed)
   Primary Cardiologist: Armanda Magic, MD  Chart reviewed as part of pre-operative protocol coverage. Patient was contacted 11/17/2020 in reference to pre-operative risk assessment for pending surgery as outlined below.  Brian Cortez was last seen on 06/30/20 by Dr. Mayford Knife.  Since that day, Brian Cortez has done well and has no cardiac complaints.  Therefore, based on ACC/AHA guidelines, the patient would be at acceptable risk for the planned procedure without further cardiovascular testing.   Per Dr. Mayford Knife he may hold his Aspirin prior to the procedure. Per my discussion with him he was recommended to hold for 2 days prior to surgery. Instructed to ask the surgeon when to resume post-procedure.   The patient was advised that if he develops new symptoms prior to surgery to contact our office to arrange for a follow-up visit, and he verbalized understanding.  I will route this recommendation to the requesting party via Epic fax function and remove from pre-op pool. Please call with questions.  Alver Sorrow, NP 11/17/2020, 9:40 AM

## 2020-11-19 DIAGNOSIS — E291 Testicular hypofunction: Secondary | ICD-10-CM | POA: Diagnosis not present

## 2020-11-22 HISTORY — PX: KNEE ARTHROSCOPY: SUR90

## 2020-11-28 DIAGNOSIS — G4733 Obstructive sleep apnea (adult) (pediatric): Secondary | ICD-10-CM | POA: Diagnosis not present

## 2020-12-03 DIAGNOSIS — E291 Testicular hypofunction: Secondary | ICD-10-CM | POA: Diagnosis not present

## 2020-12-08 ENCOUNTER — Ambulatory Visit: Payer: PPO | Admitting: Neurology

## 2020-12-19 DIAGNOSIS — E291 Testicular hypofunction: Secondary | ICD-10-CM | POA: Diagnosis not present

## 2020-12-23 HISTORY — PX: MENISCUS REPAIR: SHX5179

## 2020-12-29 DIAGNOSIS — G4733 Obstructive sleep apnea (adult) (pediatric): Secondary | ICD-10-CM | POA: Diagnosis not present

## 2021-01-02 DIAGNOSIS — E291 Testicular hypofunction: Secondary | ICD-10-CM | POA: Diagnosis not present

## 2021-01-07 DIAGNOSIS — M238X1 Other internal derangements of right knee: Secondary | ICD-10-CM | POA: Diagnosis not present

## 2021-01-07 DIAGNOSIS — X58XXXA Exposure to other specified factors, initial encounter: Secondary | ICD-10-CM | POA: Diagnosis not present

## 2021-01-07 DIAGNOSIS — Y999 Unspecified external cause status: Secondary | ICD-10-CM | POA: Diagnosis not present

## 2021-01-07 DIAGNOSIS — M1711 Unilateral primary osteoarthritis, right knee: Secondary | ICD-10-CM | POA: Diagnosis not present

## 2021-01-07 DIAGNOSIS — M948X6 Other specified disorders of cartilage, lower leg: Secondary | ICD-10-CM | POA: Diagnosis not present

## 2021-01-07 DIAGNOSIS — G8918 Other acute postprocedural pain: Secondary | ICD-10-CM | POA: Diagnosis not present

## 2021-01-07 DIAGNOSIS — S83271A Complex tear of lateral meniscus, current injury, right knee, initial encounter: Secondary | ICD-10-CM | POA: Diagnosis not present

## 2021-01-07 DIAGNOSIS — S83281A Other tear of lateral meniscus, current injury, right knee, initial encounter: Secondary | ICD-10-CM | POA: Diagnosis not present

## 2021-01-09 ENCOUNTER — Other Ambulatory Visit: Payer: Self-pay | Admitting: Internal Medicine

## 2021-01-12 DIAGNOSIS — M25561 Pain in right knee: Secondary | ICD-10-CM | POA: Diagnosis not present

## 2021-01-12 DIAGNOSIS — S83281D Other tear of lateral meniscus, current injury, right knee, subsequent encounter: Secondary | ICD-10-CM | POA: Diagnosis not present

## 2021-01-12 DIAGNOSIS — M6281 Muscle weakness (generalized): Secondary | ICD-10-CM | POA: Diagnosis not present

## 2021-01-12 DIAGNOSIS — M25661 Stiffness of right knee, not elsewhere classified: Secondary | ICD-10-CM | POA: Diagnosis not present

## 2021-01-15 DIAGNOSIS — M25561 Pain in right knee: Secondary | ICD-10-CM | POA: Diagnosis not present

## 2021-01-19 DIAGNOSIS — E291 Testicular hypofunction: Secondary | ICD-10-CM | POA: Diagnosis not present

## 2021-01-23 DIAGNOSIS — S83281D Other tear of lateral meniscus, current injury, right knee, subsequent encounter: Secondary | ICD-10-CM | POA: Diagnosis not present

## 2021-01-23 DIAGNOSIS — M1711 Unilateral primary osteoarthritis, right knee: Secondary | ICD-10-CM | POA: Diagnosis not present

## 2021-01-23 DIAGNOSIS — M25661 Stiffness of right knee, not elsewhere classified: Secondary | ICD-10-CM | POA: Diagnosis not present

## 2021-01-23 DIAGNOSIS — M6281 Muscle weakness (generalized): Secondary | ICD-10-CM | POA: Diagnosis not present

## 2021-01-26 ENCOUNTER — Ambulatory Visit: Payer: PPO | Admitting: Neurology

## 2021-01-26 ENCOUNTER — Telehealth: Payer: Self-pay | Admitting: Neurology

## 2021-01-26 ENCOUNTER — Encounter: Payer: Self-pay | Admitting: Neurology

## 2021-01-26 VITALS — BP 148/98 | HR 63 | Ht 75.0 in | Wt 271.0 lb

## 2021-01-26 DIAGNOSIS — G4730 Sleep apnea, unspecified: Secondary | ICD-10-CM | POA: Diagnosis not present

## 2021-01-26 DIAGNOSIS — Z5189 Encounter for other specified aftercare: Secondary | ICD-10-CM

## 2021-01-26 DIAGNOSIS — G4733 Obstructive sleep apnea (adult) (pediatric): Secondary | ICD-10-CM | POA: Diagnosis not present

## 2021-01-26 DIAGNOSIS — Z9989 Dependence on other enabling machines and devices: Secondary | ICD-10-CM

## 2021-01-26 DIAGNOSIS — G3184 Mild cognitive impairment, so stated: Secondary | ICD-10-CM

## 2021-01-26 DIAGNOSIS — I420 Dilated cardiomyopathy: Secondary | ICD-10-CM | POA: Diagnosis not present

## 2021-01-26 NOTE — Patient Instructions (Signed)
Management of Memory Problems  There are some general things you can do to help manage your memory problems.  Your memory may not in fact recover, but by using techniques and strategies you will be able to manage your memory difficulties better.  1)  Establish a routine. Try to establish and then stick to a regular routine.  By doing this, you will get used to what to expect and you will reduce the need to rely on your memory.  Also, try to do things at the same time of day, such as taking your medication or checking your calendar first thing in the morning. Think about think that you can do as a part of a regular routine and make a list.  Then enter them into a daily planner to remind you.  This will help you establish a routine.  2)  Organize your environment. Organize your environment so that it is uncluttered.  Decrease visual stimulation.  Place everyday items such as keys or cell phone in the same place every day (ie.  Basket next to front door) Use post it notes with a brief message to yourself (ie. Turn off light, lock the door) Use labels to indicate where things go (ie. Which cupboards are for food, dishes, etc.) Keep a notepad and pen by the telephone to take messages  3)  Memory Aids A diary or journal/notebook/daily planner Making a list (shopping list, chore list, to do list that needs to be done) Using an alarm as a reminder (kitchen timer or cell phone alarm) Using cell phone to store information (Notes, Calendar, Reminders) Calendar/White board placed in a prominent position Post-it notes  In order for memory aids to be useful, you need to have good habits.  It's no good remembering to make a note in your journal if you don't remember to look in it.  Try setting aside a certain time of day to look in journal.  4)  Improving mood and managing fatigue. There may be other factors that contribute to memory difficulties.  Factors, such as anxiety, depression and tiredness can  affect memory. Regular gentle exercise can help improve your mood and give you more energy. Simple relaxation techniques may help relieve symptoms of anxiety Try to get back to completing activities or hobbies you enjoyed doing in the past. Learn to pace yourself through activities to decrease fatigue. Find out about some local support groups where you can share experiences with others. Try and achieve 7-8 hours of sleep at night. There are well-accepted and sensible ways to reduce risk for Alzheimers disease and other degenerative brain disorders .  Exercise Daily Walk A daily 20 minute walk should be part of your routine. Disease related apathy can be a significant roadblock to exercise and the only way to overcome this is to make it a daily routine and perhaps have a reward at the end (something your loved one loves to eat or drink perhaps) or a personal trainer coming to the home can also be very useful. Most importantly, the patient is much more likely to exercise if the caregiver / spouse does it with him/her. In general a structured, repetitive schedule is best.  General Health: Any diseases which effect your body will effect your brain such as a pneumonia, urinary infection, blood clot, heart attack or stroke. Keep contact with your primary care doctor for regular follow ups.  Sleep. A good nights sleep is healthy for the brain. Seven hours is recommended. If you have   insomnia or poor sleep habits we can give you some instructions. If you have sleep apnea wear your mask.  Diet: Eating a heart healthy diet is also a good idea; fish and poultry instead of red meat, nuts (mostly non-peanuts), vegetables, fruits, olive oil or canola oil (instead of butter), minimal salt (use other spices to flavor foods), whole grain rice, bread, cereal and pasta and wine in moderation.Research is now showing that the MIND diet, which is a combination of The Mediterranean diet and the DASH diet, is beneficial for  cognitive processing and longevity. Information about this diet can be found in The MIND Diet, a book by Maggie Moon, MS, RDN, and online at https://www.healthline.com/nutrition/mind-diet  Finances, Power of Attorney and Advance Directives: You should consider putting legal safeguards in place with regard to financial and medical decision making. While the spouse always has power of attorney for medical and financial issues in the absence of any form, you should consider what you want in case the spouse / caregiver is no longer around or capable of making decisions.   The Alzheimers Association Position on Disease Prevention  Can Alzheimer's be prevented? It's a question that continues to intrigue researchers and fuel new investigations. There are no clear-cut answers yet -- partially due to the need for more large-scale studies in diverse populations -- but promising research is under way. The Alzheimer's Association is leading the worldwide effort to find a treatment for Alzheimer's, delay its onset and prevent it from developing.   What causes Alzheimer's? Experts agree that in the vast majority of cases, Alzheimer's, like other common chronic conditions, probably develops as a result of complex interactions among multiple factors, including age, genetics, environment, lifestyle and coexisting medical conditions. Although some risk factors -- such as age or genes -- cannot be changed, other risk factors -- such as high blood pressure and lack of exercise -- usually can be changed to help reduce risk. Research in these areas may lead to new ways to detect those at highest risk.  Prevention studies A small percentage of people with Alzheimer's disease (less than 1 percent) have an early-onset type associated with genetic mutations. Individuals who have these genetic mutations are guaranteed to develop the disease. An ongoing clinical trial conducted by the [facility]herited [facility] (DIAN),  is testing whether antibodies to beta-amyloid can reduce the accumulation of beta-amyloid plaque in the brains of people with such genetic mutations and thereby reduce, delay or prevent symptoms. Participants in the trial are receiving antibodies (or placebo) before they develop symptoms, and the development of beta-amyloid plaques is being monitored by brain scans and other tests.  Another clinical trial, known as the A4 trial (Anti-Amyloid Treatment in Asymptomatic Alzheimer's), is testing whether antibodies to beta-amyloid can reduce the risk of Alzheimer's disease in older people (ages 65 to 85) at high risk for the disease. The A4 trial is being conducted by the Alzheimer's Disease Cooperative Study.  Though research is still evolving, evidence is strong that people can reduce their risk by making key lifestyle changes, including participating in regular activity and maintaining good heart health. Based on this research, the Alzheimer's Association offers 10 Ways to Love Your Brain -- a collection of tips that can reduce the risk of cognitive decline.  Heart-head connection  New research shows there are things we can do to reduce the risk of mild cognitive impairment and dementia.  Several conditions known to increase the risk of cardiovascular disease -- such as high   blood pressure, diabetes and high cholesterol -- also increase the risk of developing Alzheimer's. Some autopsy studies show that as many as 80 percent of individuals with Alzheimer's disease also have cardiovascular disease.  A longstanding question is why some people develop hallmark Alzheimer's plaques and tangles but do not develop the symptoms of Alzheimer's. Vascular disease may help researchers eventually find an answer. Some autopsy studies suggest that plaques and tangles may be present in the brain without causing symptoms of cognitive decline unless the brain also shows evidence of vascular disease. More research is needed  to better understand the link between vascular health and Alzheimer's.  Physical exercise and diet Regular physical exercise may be a beneficial strategy to lower the risk of Alzheimer's and vascular dementia. Exercise may directly benefit brain cells by increasing blood and oxygen flow in the brain. Because of its known cardiovascular benefits, a medically approved exercise program is a valuable part of any overall wellness plan.  Current evidence suggests that heart-healthy eating may also help protect the brain. Heart-healthy eating includes limiting the intake of sugar and saturated fats and making sure to eat plenty of fruits, vegetables, and whole grains. No one diet is best. Two diets that have been studied and may be beneficial are the DASH (Dietary Approaches to Stop Hypertension) diet and the Mediterranean diet. The DASH diet emphasizes vegetables, fruits and fat-free or low-fat dairy products; includes whole grains, fish, poultry, beans, seeds, nuts and vegetable oils; and limits sodium, sweets, sugary beverages and red meats. A Mediterranean diet includes relatively little red meat and emphasizes whole grains, fruits and vegetables, fish and shellfish, and nuts, olive oil and other healthy fats.  Social connections and intellectual activity A number of studies indicate that maintaining strong social connections and keeping mentally active as we age might lower the risk of cognitive decline and Alzheimer's. Experts are not certain about the reason for this association. It may be due to direct mechanisms through which social and mental stimulation strengthen connections between nerve cells in the brain.  Head trauma There appears to be a strong link between future risk of Alzheimer's and serious head trauma, especially when injury involves loss of consciousness. You can help reduce your risk of Alzheimer's by protecting your head.  Wear a seat belt  Use a helmet when participating in sports   "Fall-proof" your home   What you can do now While research is not yet conclusive, certain lifestyle choices, such as physical activity and diet, may help support brain health and prevent Alzheimer's. Many of these lifestyle changes have been shown to lower the risk of other diseases, like heart disease and diabetes, which have been linked to Alzheimer's. With few drawbacks and plenty of known benefits, healthy lifestyle choices can improve your health and possibly protect your brain.  Learn more about brain health. You can help increase our knowledge by considering participation in a clinical study. Our free clinical trial matching services, TrialMatch, can help you find clinical trials in your area that are seeking volunteers.  Understanding prevention research Here are some things to keep in mind about the research underlying much of our current knowledge about possible prevention:  Insights about potentially modifiable risk factors apply to large population groups, not to individuals. Studies can show that factor X is associated with outcome Y, but cannot guarantee that any specific person will have that outcome. As a result, you can "do everything right" and still have a serious health problem or "do everything   wrong" and live to be 100.  Much of our current evidence comes from large epidemiological studies such as the [place] Aging Study, the Nurses' Health Study, the Adult Changes in Thought Study and the [place] Project. These studies explore pre-existing behaviors and use statistical methods to relate those behaviors to health outcomes. This type of study can show an "association" between a factor and an outcome but cannot "prove" cause and effect. This is why we describe evidence based on these studies with such language as "suggests," "may show," "might protect," and "is associated with."  The gold standard for showing cause and effect is a clinical trial in which participants are  randomly assigned to a prevention or risk management strategy or a control group. Researchers follow the two groups over time to see if their outcomes differ significantly.  It is unlikely that some prevention or risk management strategies will ever be tested in randomized trials for ethical or practical reasons. One example is exercise. Definitively testing the impact of exercise on Alzheimer's risk would require a huge trial enrolling thousands of people and following them for many years. The expense and logistics of such a trial would be prohibitive, and it would require some people to go without exercise, a known health benefit.   

## 2021-01-26 NOTE — Telephone Encounter (Signed)
health team order sent ot GI. No auth they will reach out to the patient to schedule.

## 2021-01-26 NOTE — Addendum Note (Signed)
Addended by: Larey Seat on: 01/26/2021 02:18 PM   Modules accepted: Orders

## 2021-01-26 NOTE — Progress Notes (Signed)
[facility]   Provider:  Larey Seat, M D  Referring Provider: Prince Solian, MD Primary Care Physician:  Prince Solian, MD  Chief Complaint  Patient presents with  . Follow-up    Pt alone, rm 10. Presents for f/u. States machine is working well. DME [facility].    HPI: 01-26-2021: Brian Cortez is a 70 y.o. male and seen for a new and different problem today, regarding subjective memory loss. He has been sleeping well, breathing well and is  He is very concerned about his short memory. He reports trouble with name finding. He couldn't remember the name of a Play at [facility] he attended last week- friends with Gershon Crane.  We will do a MOCA today- he started off with visio-spatial and executive dysfunction. His CPAP compliance plays a secondary role today- 100 % compliance and residual AHI 1.5/h. will need no adjustment.     seen on 05-21-2020.  Brian Cortez was in need of a new CPAP machine he was evaluated for his current baseline by home sleep test performed on January 23, 2020.  He has been followed here for CPAP compliance for over 5 years.  Fully vaccinated, no nocturia, treated for bladder cancer.  Surprisingly mild obstructive sleep apnea was this time found at an AHI of 9.4/h but strongly REM dependent.  During REM sleep his AHI was 20.6 associated with moderate snoring there was also a bradycardia trend and prolonged hypoxemic intervals.  My recommendation was to start a new CPAP machine with auto titration measures as hypoxemia and REM dependence cannot be addressed in a dental device for inspire device.  The patient has been 100% compliant with his new device a minimum pressure of 5 maximum pressure of 15 cmH2O with 3 cm EPR the AHI was 0.8/h which is an excellent resolution of apnea.  This is a 90% reduction of his apnea count in comparison to his new baseline.  He has mild to moderate air leakage his pressure at the 95th  percentile is 11.3 cmH2O and well within the current set limits.  I do not need to make any changes unless the patient has any discomfort with the interface.  Otherwise I encouraged him to continue his compliant use of CPAP. He reports his water reservoir is almost empty after a night of use.  He no longer uses an ozone cleaner - not ever since he has a new machine.  The patient endorsed the Epworth sleepiness score today at 3 points only the fatigue severity score at 17 points, the geriatric depression score at 0 points. He continues to play golf he is watching out to prevent dehydration.     12-19-2019, he follows Dr. Dagmar Hait for PCP.  He follows for CPAP compliance. His machine is now 70 years old and he needs a new one. He has not contracted Covid to his knowledge , he got the first shot last Thursday.  His diagnosis is OSA- allergic rhinitis - obesity- pneumonia- he no longer has nocturia.    Last seen here as a revisit on 09-20-2018, At the pleasure of meeting today with Brian Cortez, and meanwhile retired 70 year old Caucasian married male patient who has been followed here for CPAP needs since 2013 of 14.  He continues to be highly compliant CPAP patient, his average use of time of 7 hours 7 minutes for this 30-day.  And his compliance was 87%, residual AHi at 1.5/h. Uses an S9 Autoset ,  set at 8 cm , 2 cm EPR.  He was not able to use his CPAP on 5 days due to illness, he has learned of a return of his bladder cancer by cystoscopy- Biopsy Dr. Lawerance Bach - and needed additional work-up and initiation of chemotherapy.  09-14-2017,Brian Cortez is seen here today for a yearly revisit. He has been very compliant CPAP user with 5% compliance averaging 8 hours and 23 minutes at night set pressure is 8 cm water with 27 cm EPR, residual AHI is 0.8 he does not have major air leaks in the low residual AHI is equally distributed between central and obstructive events. He has lost weight visibly. He  endorsed the Epworth sleepiness score at 2 points the fatigue severity score at 11 points the geriatric depression score at 1 out of 15 points. He is retired, he travels and enjoys golf. He is taking Administrator.    Fatigue and lightheadedness were improved, until last month. He has no longer nocturia since being on CPAP. In 2016 he went to the ED and was diagnosed with diastolic heart failure, had a nuclear test with Dr. Loralie Champagne - improved on Coreg.  His medical history has changed drastically over the last 12 month, he suffered a congestive heart failure episode and has been controlled by Coreg. His bladder cancer was discovered in Dec 2014. Underwent BCG treatment. Has a 30% re-occurrence risk. He had pneumonia and changed PCPs. The data collection began on 07-28-14 and ended on 08-26-14 CPAP is used at 8 cm water pressure with 2 cm EPR the residual AHI is 1.6 which is excellent,  the patient uses a machine on average 8 hours and 24 minutes at night and has 100% compliance for nightly use  over 4 hours. Median daily usage 8 hours 11 minutes.  Interval history history from 09-09-15, the patient's 100% compliant for the number of 30 days and each of these days over 4 hours of consecutive use has been registered. On average he uses his machine for 8 hours and 47 minutes set pressure is 8 cm water with 2 cm expiratory pressure relief ( EPR) the residual AHI is 1.4 he has minimal air leaks, his Epworth sleepiness score today is 1. fatigue severity 6 points and the geriatric depression score is endorsed at one point.  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 When I saw him last year he had been suffering from pneumonia and his primary care physician, Dr. Elsworth Soho, had treated him. The patient is a bladder cancer survivor and has been diagnosed with diastolic heart failure. His cardiac function had improved on Coreg. I have followed  him for obstructive sleep apnea and he is a CPAP compliant patient with 100% of use of his CPAP at average use of 8 hours and 54 minutes nightly. CPAP is set at 8 cm water pressure this 2 cm EPR and a residual AHI of 1.4. Minor air leaks are noted, these have no clinical bearing.    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  Sleeping better, SOB today, coughing. has ciliary injection, hay fever.  Severe  allergic rhinitis. Obese. Has not had recurrence of Nocturia. Hot flushes after d/c testosterone  supplements.  How likely are you to doze in the following situations: 0 = not likely, 1 = slight chance, 2 = moderate chance, 3 = high chance  Sitting and Reading? 1 Watching Television?1 Sitting inactive in a public place (theater or meeting)? Lying down in the afternoon when circumstances permit?1 Sitting and talking to someone? Sitting quietly after lunch without alcohol?  In a car, while stopped for a few minutes in traffic? As a passenger in a car for an hour without a break?  Total =from previously 10 /24 now to 3/ 24 points.   FSS 15/ 63     He is finished  6 chemotherapy rounds in 2020.  Bladder cancer had returned, this has affected his fatigue which was endorsed at 35 out of 63 possible points, but he has not had excessive daytime sleepiness and endorsed only 2 out of 24 points.   He also is not clinically depressed geriatric depression score endorsed at 2 out of 15 points.  He is worried about memory - has survived cancer, has glaucoma and had cataract surgery. today is 01-26-2021.     Social History   Socioeconomic History  . Marital status: Married    Spouse name: Pamala Hurry  . Number of children: 2  . Years of education: Post Grad  . Highest  education level: Not on file  Occupational History  . Occupation: RETIRED BANK -PRESIDENT    Employer: [employer]  . Occupation: PRESIDENT    Employer: [employer]  Tobacco Use  . Smoking status: Never Smoker  . Smokeless tobacco: Never Used  Vaping Use  . Vaping Use: Never used  Substance and Sexual Activity  . Alcohol use: Yes    Alcohol/week: 0.0 standard drinks    Comment: occasional  . Drug use: No  . Sexual activity: Not on file    Comment: VASECTOMY  Other Topics Concern  . Not on file  Social History Narrative   Patient lives at home with spouse.    Caffeine Use: 3-4 cups   Social Determinants of Health   Financial Resource Strain: Not on file  Food Insecurity: Not on file  Transportation Needs: Not on file  Physical Activity: Not on file  Stress: Not on file  Social Connections: Not on file  Intimate Partner Violence: Not on file    Family History  Problem Relation Age of Onset  . Heart attack Father 69  . Stroke Father 19  . Heart attack Paternal Grandfather        in 6s  . Prostate cancer Paternal Uncle   . Heart attack Paternal Uncle         > 55  . Heart attack Maternal Uncle 80       > 55  . Colon cancer Neg Hx   . Diabetes Neg Hx     Past Medical History:  Diagnosis Date  . Anxiety   . Bladder tumor   . Carpal tunnel syndrome, left    intermittant  . Chronic seasonal allergic rhinitis   . Coronary artery disease    per Dr Aundra Dubin (cardiologist) note --- noted on CT chest coronary calcification -- ETT-Cardiolite normal in 2014  . DDD (degenerative disc disease), cervical   . Dilated cardiomyopathy ([place])   . Diverticulosis of colon   . Fecal incontinence   . GERD (gastroesophageal reflux disease)   . Grade II diastolic dysfunction 33/82/5053   per ECHO   . Hemorrhoids   . Hiatal hernia   .  History of adenomatous polyp of colon    2001;  2007;  2012  . History of bladder cancer urologist-  dr Gaynelle Arabian   11-06-2013  s/p TURBT  and BCG tx's  . History of kidney stones    about 45 years ago  . History of panic attacks   . History of urethral stricture   . Hyperlipidemia   . Hypertension   . Hypogonadism in male   . LVH (left ventricular hypertrophy) 06/17/2017   Mild, noted on ECHO  . Nonischemic cardiomyopathy [facility])    cardiologist-  dr Aundra Dubin-- per last echo 01/ 2016 ef 50-55%  (up from 09/ 2014 ef 45-50%)  . OSA on CPAP    since 2014  . Pneumonia 11/2010   Left lower lobe  . Pulmonary hypertension ([place]) 06/17/2017   Mild, per ECHO    Past Surgical History:  Procedure Laterality Date  . CARDIOVASCULAR STRESS TEST  08-21-2013  dr Aundra Dubin   normal nuclear study w/ no ischemia/  normal LV function and wall motion , ef 63%  . CATARACT EXTRACTION W/ INTRAOCULAR LENS  IMPLANT, BILATERAL  2013  . COLONOSCOPY  last one 03-03-2016  . CYSTO/  DIRECT VISION INTERNAL URETEROTOMY  11/25/2006  . KNEE ARTHROSCOPY  2022  . LAPAROSCOPIC CHOLECYSTECTOMY  10/15/2006  . ORBITAL FRACTURE SURGERY  1986   left eye:  Jan 1986--- fixation of fractures, repair of optic nerve decompression , and complex closure  . TONSILLECTOMY  child  . TRANSTHORACIC ECHOCARDIOGRAM  12-16-2014   dr Aundra Dubin   grade 1 diastolic dysfunction, ef 62-95% (improved from last study in 2014 , previous ef 45-50%) /  trivial MR and TR/  mild LAE/  inferior vena cava dilated, respirophasic diameter changes were blunted (<50%), consistant w/ elevated central venous pressure  . TRANSURETHRAL RESECTION OF BLADDER TUMOR Bilateral 05/16/2018   Procedure: TRANSURETHRAL RESECTION OF BLADDER TUMOR (TURBT)/ RETROGRADE;  Surgeon: Festus Aloe, MD;  Location: [facility];  Service: Urology;  Laterality: Bilateral;  . TRANSURETHRAL RESECTION OF BLADDER TUMOR WITH GYRUS (TURBT-GYRUS) N/A 11/06/2013   Procedure: TRANSURETHRAL RESECTION OF BLADDER TUMOR WITH GYRUS (TURBT-GYRUS);  Surgeon: Ailene Rud, MD;  Location: [facility];  Service: Urology;   Laterality: N/A;  . TRANSURETHRAL RESECTION OF BLADDER TUMOR WITH MITOMYCIN-C N/A 12/09/2016   Procedure: CYSTOSCOPY TRANSURETHRAL RESECTION OF BLADDER TUMOR WITH MITOMYCIN-C;  Surgeon: Carolan Clines, MD;  Location: [place];  Service: Urology;  Laterality: N/A;  . VARICOSE VEIN SURGERY  2002  . VASECTOMY      Current Outpatient Medications  Medication Sig Dispense Refill  . allopurinol (ZYLOPRIM) 300 MG tablet Take 300 mg by mouth daily.    Marland Kitchen anastrozole (ARIMIDEX) 1 MG tablet Take 2 mg by mouth daily.    Marland Kitchen aspirin 81 MG tablet Take 81 mg by mouth every evening.    Marland Kitchen atorvastatin (LIPITOR) 40 MG tablet Take 40 mg by mouth every evening.     . bimatoprost (LUMIGAN) 0.01 % SOLN INSTILL ONE DROP IN EACH EYE AT BEDTIME    . escitalopram (LEXAPRO) 20 MG tablet Take 20 mg by mouth every morning.     . fenofibrate 160 MG tablet Take 1 tablet (160 mg total) by mouth daily. 90 tablet 3  . fluticasone (FLONASE) 50 MCG/ACT nasal spray FLUTICASONE PROPIONATE 50 MCG/ACT SUSP---  bid prn    . Multiple Vitamins-Minerals (PRESERVISION AREDS 2 PO) Take 1 capsule by mouth 2 (two) times daily.     Marland Kitchen  Omega-3 Fatty Acids (FISH OIL) 1000 MG CAPS Take 2 capsules (2,000 mg total) by mouth 2 (two) times daily.  0  . pantoprazole (PROTONIX) 40 MG tablet TAKE ONE TABLET DAILY 90 tablet 3  . timolol (BETIMOL) 0.5 % ophthalmic solution Place 1 drop into the right eye daily.     No current facility-administered medications for this visit.    Allergies as of 01/26/2021 - Review Complete 01/26/2021  Allergen Reaction Noted  . Pollen extract Cough 01/13/2013  . Sulfamethoxazole-trimethoprim Rash 11/20/2018    Vitals: BP (!) 148/98   Pulse 63   Ht 6\' 3"  (1.905 m)   Wt 271 lb (122.9 kg)   BMI 33.87 kg/m  Last Weight:  Wt Readings from Last 1 Encounters:  01/26/21 271 lb (122.9 kg)   Last Height:   Ht Readings from Last 1 Encounters:  01/26/21 6\' 3"  (1.905 m)     General: The  patient is awake, alert and appears  in acute distress from hay fever and sinusitis. Nasal congestion is noted.  The patient is well groomed. He has visible pressure marks.  Head: Normocephalic, atraumatic. Neck is supple. Circumference at 19- inches   Circumference, from 21" .   Mallampati 3 plus - large neck, small opening   Sinus congestion.  Retrognathia is noted .  Cardiovascular: Regular rate and rhythm, without murmurs or carotid bruit, and without distended neck veins. Respiratory: Lungs are clear to auscultation. He has faster RR 23- min.  Skin:   Ankle edema, affecting the ankle sensation for vibration.  Varicose veins visible in both lower extremities.  Trunk: BMI reduced to 33.87,  he has an erect posture.  Neurologic exam : The patient is awake and alert, oriented to place and time.   Memory subjective  described as intact.  Cranial nerves: no loss of smell or taste- Pupils are equally round and reactive to light.  Status post cataract.  Fully vaccinated.  Nystagmus with gaze to the left , unrestricted visual field.  Facial motor strength is symmetric and his tongue and uvula move midline. Hearing intact.  DTR intact, symmetrical. Babinski downgoing.   Assessment:  After physical and neurologic examination, review of laboratory studies, imaging, neurophysiology testing and pre-existing records, assessment will be reviewed on the problem list:  Memory deficit.- MOCA at 27 out of 30.   Further OSA, obesity, cardiomyopathy, bladder cancer, repeated infections- Sinusitis, bronchitis, Pneumonia.   Plan:   1) VISIOSPATIAL deficit on MOCA , reporting amnestic spells - "pullling a blank" , sometimes there is a delay. Not getting lost, not having trouble to file taxes,  financial affairs. He has trouble on the golf course.   I would like to repeat an MRI brain, with and without contrast.  Looking for any vascular changes, lesions. Marland Kitchen   2) OSA - 100% compliance for the last 5 years,  on CPAP well controlled.  Now on new machine same reciprocal results.  He changed to [employer], now bought up by [employer].   Dr Radford Pax is following orthostatic hypotension, HTN and cardiomyopathy.    Larey Seat, MD      Cc:  Dr Dagmar Hait.

## 2021-01-27 LAB — COMPREHENSIVE METABOLIC PANEL
ALT: 36 IU/L (ref 0–44)
AST: 32 IU/L (ref 0–40)
Albumin/Globulin Ratio: 2 (ref 1.2–2.2)
Albumin: 4.7 g/dL (ref 3.8–4.8)
Alkaline Phosphatase: 52 IU/L (ref 44–121)
BUN/Creatinine Ratio: 17 (ref 10–24)
BUN: 20 mg/dL (ref 8–27)
Bilirubin Total: 0.8 mg/dL (ref 0.0–1.2)
CO2: 24 mmol/L (ref 20–29)
Calcium: 9.8 mg/dL (ref 8.6–10.2)
Chloride: 97 mmol/L (ref 96–106)
Creatinine, Ser: 1.19 mg/dL (ref 0.76–1.27)
Globulin, Total: 2.4 g/dL (ref 1.5–4.5)
Glucose: 91 mg/dL (ref 65–99)
Potassium: 4.3 mmol/L (ref 3.5–5.2)
Sodium: 138 mmol/L (ref 134–144)
Total Protein: 7.1 g/dL (ref 6.0–8.5)
eGFR: 66 mL/min/{1.73_m2} (ref >59)

## 2021-01-28 ENCOUNTER — Encounter: Payer: Self-pay | Admitting: Neurology

## 2021-01-28 NOTE — Progress Notes (Signed)
All looking good! We can use contrast for brain MRI

## 2021-02-02 ENCOUNTER — Other Ambulatory Visit: Payer: Self-pay

## 2021-02-02 ENCOUNTER — Encounter (HOSPITAL_COMMUNITY): Payer: Self-pay | Admitting: Emergency Medicine

## 2021-02-02 ENCOUNTER — Emergency Department (HOSPITAL_COMMUNITY)
Admission: EM | Admit: 2021-02-02 | Discharge: 2021-02-02 | Disposition: A | Discharge status: Home / Self Care | Payer: PPO | Attending: Emergency Medicine | Admitting: Emergency Medicine

## 2021-02-02 DIAGNOSIS — I10 Essential (primary) hypertension: Secondary | ICD-10-CM | POA: Insufficient documentation

## 2021-02-02 DIAGNOSIS — Z8551 Personal history of malignant neoplasm of bladder: Secondary | ICD-10-CM | POA: Diagnosis not present

## 2021-02-02 DIAGNOSIS — R040 Epistaxis: Secondary | ICD-10-CM | POA: Insufficient documentation

## 2021-02-02 DIAGNOSIS — I2510 Atherosclerotic heart disease of native coronary artery without angina pectoris: Secondary | ICD-10-CM | POA: Diagnosis not present

## 2021-02-02 DIAGNOSIS — Z79899 Other long term (current) drug therapy: Secondary | ICD-10-CM | POA: Insufficient documentation

## 2021-02-02 DIAGNOSIS — Z951 Presence of aortocoronary bypass graft: Secondary | ICD-10-CM | POA: Insufficient documentation

## 2021-02-02 DIAGNOSIS — Z7982 Long term (current) use of aspirin: Secondary | ICD-10-CM | POA: Diagnosis not present

## 2021-02-02 MED ORDER — OXYMETAZOLINE HCL 0.05 % NA SOLN
1.0000 | Freq: Once | NASAL | Status: AC
  Administered 2021-02-02: 1 via NASAL
  Filled 2021-02-02: qty 30

## 2021-02-02 NOTE — ED Provider Notes (Signed)
[facility] EMERGENCY DEPARTMENT Provider Note   CSN: [id number] Arrival date & time: 02/02/21  1517     History Chief Complaint  Patient presents with  . Epistaxis    Brian Cortez is a 70 y.o. male.  70 year old male with past medical history of hypertension (not on medications), additional history as listed below, presents with complaint of nosebleed onset yesterday from the left side.  Patient was able to get the nosebleed to stop yesterday however bleeding returned today and has been unable to control at home.  Bleeding started 20 minutes ago.  Patient is not anticoagulated.  No other complaints or concerns.        Past Medical History:  Diagnosis Date  . Anxiety   . Bladder tumor   . Carpal tunnel syndrome, left    intermittant  . Chronic seasonal allergic rhinitis   . Coronary artery disease    per Dr Aundra Dubin (cardiologist) note --- noted on CT chest coronary calcification -- ETT-Cardiolite normal in 2014  . DDD (degenerative disc disease), cervical   . Dilated cardiomyopathy ([place])   . Diverticulosis of colon   . Fecal incontinence   . GERD (gastroesophageal reflux disease)   . Grade II diastolic dysfunction 61/60/7371   per ECHO   . Hemorrhoids   . Hiatal hernia   . History of adenomatous polyp of colon    2001;  2007;  2012  . History of bladder cancer urologist-  dr Gaynelle Arabian   11-06-2013  s/p TURBT and BCG tx's  . History of kidney stones    about 45 years ago  . History of panic attacks   . History of urethral stricture   . Hyperlipidemia   . Hypertension   . Hypogonadism in male   . LVH (left ventricular hypertrophy) 06/17/2017   Mild, noted on ECHO  . Nonischemic cardiomyopathy [facility])    cardiologist-  dr Aundra Dubin-- per last echo 01/ 2016 ef 50-55%  (up from 09/ 2014 ef 45-50%)  . OSA on CPAP    since 2014  . Pneumonia 11/2010   Left lower lobe  . Pulmonary hypertension ([place]) 06/17/2017   Mild, per ECHO    Patient Active  Problem List   Diagnosis Date Noted  . Myopic macular degeneration 04/02/2020  . Posterior vitreous detachment of right eye 04/02/2020  . Early stage nonexudative age-related macular degeneration of both eyes 04/02/2020  . 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 Neoplastic malignant related fatigue 09/20/2018  . Convalescence after chemotherapy 09/20/2018  . Anterior urethral stricture 08/04/2018  . Bladder trabeculation 08/04/2018  . Diverticula, bladder acquired 08/04/2018  . Erectile dysfunction due to diseases classified elsewhere 08/04/2018  . Low testosterone in male 08/04/2018  . Primary open angle glaucoma (POAG) of both eyes, moderate stage 02/10/2018  . Obesity with body mass index greater than 30 09/14/2017  . Nasal congestion with rhinorrhea 09/08/2016  . Chronic seasonal allergic rhinitis due to pollen 09/08/2016  . Acute recurrent ethmoidal sinusitis 09/09/2015  . Bronchitis 09/09/2015  . OSA on CPAP 09/09/2015  . Rhinitis due to pollen 09/09/2015  . CAP (community acquired pneumonia) 10/15/2014  . Allergic rhinitis 10/15/2014  . Atelectasis 09/30/2014  . Abnormal weight loss 09/06/2014  . Cough 09/06/2014  . CPAP rhinitis 08/27/2014  . Anxiety disorder 03/15/2014  . Colorectal polyps 03/15/2014  . Glaucoma 03/15/2014  . Screening for depression 03/15/2014  . Bladder cancer ([place]) 11/06/2013  . Sleep apnea with use of  continuous positive airway pressure (CPAP) 08/23/2013  . Coronary artery calcification seen on CAT scan 08/17/2013  . Cardiomyopathy ([place]) 08/17/2013  . Diastolic dysfunction 63/89/3734  . Testosterone deficiency 10/18/2012  . CARPAL TUNNEL SYNDROME, LEFT 03/12/2010  . CERVICAL RADICULOPATHY 03/12/2010  . NONSPECIFIC ABNORMAL ELECTROCARDIOGRAM 10/08/2009  . ADENOMATOUS COLONIC POLYP 04/06/2008  . DIVERTICULOSIS, COLON 04/06/2008  . HIATAL HERNIA 12/18/2007  . HYPERLIPIDEMIA 10/20/2007  . Essential hypertension 05/30/2007  .  G E R D 05/30/2007  . HYPERGLYCEMIA 12/19/2006    Past Surgical History:  Procedure Laterality Date  . CARDIOVASCULAR STRESS TEST  08-21-2013  dr Aundra Dubin   normal nuclear study w/ no ischemia/  normal LV function and wall motion , ef 63%  . CATARACT EXTRACTION W/ INTRAOCULAR LENS  IMPLANT, BILATERAL  2013  . COLONOSCOPY  last one 03-03-2016  . CYSTO/  DIRECT VISION INTERNAL URETEROTOMY  11/25/2006  . KNEE ARTHROSCOPY  2022  . LAPAROSCOPIC CHOLECYSTECTOMY  10/15/2006  . ORBITAL FRACTURE SURGERY  1986   left eye:  Jan 1986--- fixation of fractures, repair of optic nerve decompression , and complex closure  . TONSILLECTOMY  child  . TRANSTHORACIC ECHOCARDIOGRAM  12-16-2014   dr Aundra Dubin   grade 1 diastolic dysfunction, ef 28-76% (improved from last study in 2014 , previous ef 45-50%) /  trivial MR and TR/  mild LAE/  inferior vena cava dilated, respirophasic diameter changes were blunted (<50%), consistant w/ elevated central venous pressure  . TRANSURETHRAL RESECTION OF BLADDER TUMOR Bilateral 05/16/2018   Procedure: TRANSURETHRAL RESECTION OF BLADDER TUMOR (TURBT)/ RETROGRADE;  Surgeon: Festus Aloe, MD;  Location[facility]  Service: Urology;  Laterality: Bilateral;  . TRANSURETHRAL RESECTION OF BLADDER TUMOR WITH GYRUS (TURBT-GYRUS) N/A 11/06/2013   Procedure: TRANSURETHRAL RESECTION OF BLADDER TUMOR WITH GYRUS (TURBT-GYRUS);  Surgeon: Ailene Rud, MD;  Location: [facility];  Service: Urology;  Laterality: N/A;  . TRANSURETHRAL RESECTION OF BLADDER TUMOR WITH MITOMYCIN-C N/A 12/09/2016   Procedure: CYSTOSCOPY TRANSURETHRAL RESECTION OF BLADDER TUMOR WITH MITOMYCIN-C;  Surgeon: Carolan Clines, MD;  Location: [facility];  Service: Urology;  Laterality: N/A;  . VARICOSE VEIN SURGERY  2002  . VASECTOMY         Family History  Problem Relation Age of Onset  . Heart attack Father 30  . Stroke Father 35  . Heart attack Paternal Grandfather         in 46s  . Prostate cancer Paternal Uncle   . Heart attack Paternal Uncle         > 55  . Heart attack Maternal Uncle 80       > 55  . Colon cancer Neg Hx   . Diabetes Neg Hx     Social History   Tobacco Use  . Smoking status: Never Smoker  . Smokeless tobacco: Never Used  Vaping Use  . Vaping Use: Never used  Substance Use Topics  . Alcohol use: Yes    Alcohol/week: 0.0 standard drinks    Comment: occasional  . Drug use: No    Home Medications Prior to Admission medications   Medication Sig Start Date End Date Taking? Authorizing Provider  allopurinol (ZYLOPRIM) 300 MG tablet Take 300 mg by mouth daily. 05/28/19   [provider]  anastrozole (ARIMIDEX) 1 MG tablet Take 2 mg by mouth daily. 12/05/20   [provider]  aspirin 81 MG tablet Take 81 mg by mouth every evening.    [provider]  atorvastatin (  LIPITOR) 40 MG tablet Take 40 mg by mouth every evening.     [provider]  bimatoprost (LUMIGAN) 0.01 % SOLN INSTILL ONE DROP IN [facility] EYE AT BEDTIME 07/31/18   [provider]  escitalopram (LEXAPRO) 20 MG tablet Take 20 mg by mouth every morning.     [provider]  fenofibrate 160 MG tablet Take 1 tablet (160 mg total) by mouth daily. 07/08/20   Sueanne Margarita, MD  fluticasone (FLONASE) 50 MCG/ACT nasal spray FLUTICASONE PROPIONATE 50 MCG/ACT SUSP---  bid prn 03/15/14   [provider]  Multiple Vitamins-Minerals (PRESERVISION AREDS 2 PO) Take 1 capsule by mouth 2 (two) times daily.     [provider]  Omega-3 Fatty Acids (FISH OIL) 1000 MG CAPS Take 2 capsules (2,000 mg total) by mouth 2 (two) times daily. 09/05/20   Sueanne Margarita, MD  pantoprazole (PROTONIX) 40 MG tablet TAKE ONE TABLET DAILY 01/09/21   Irene Shipper, MD  timolol (BETIMOL) 0.5 % ophthalmic solution Place 1 drop into the right eye daily.    [provider]    Allergies    Pollen extract and  Sulfamethoxazole-trimethoprim  Review of Systems   Review of Systems  Constitutional: Negative for fever.  HENT: Positive for nosebleeds.   Eyes: Negative for visual disturbance.  Respiratory: Negative for shortness of breath.   Cardiovascular: Negative for chest pain.  Gastrointestinal: Negative for nausea and vomiting.  Skin: Negative for rash and wound.  Allergic/Immunologic: Negative for immunocompromised state.  Neurological: Negative for dizziness and headaches.  Hematological: Does not bruise/bleed easily.  All other systems reviewed and are negative.   Physical Exam Updated Vital Signs BP (!) 166/93 (BP Location: Right Arm)   Pulse 65   Temp 97.7 F (36.5 C) (Oral)   Resp 17   Ht 6\' 3"  (1.905 m)   Wt 122 kg   SpO2 96%   BMI 33.62 kg/m   Physical Exam Vitals and nursing note reviewed.  Constitutional:      General: He is not in acute distress.    Appearance: He is well-developed. He is not diaphoretic.  HENT:     Head: Normocephalic and atraumatic.     Nose:     Right Nostril: No epistaxis.     Left Nostril: Epistaxis present.     Mouth/Throat:     Mouth: Mucous membranes are moist.  Eyes:     Conjunctiva/sclera: Conjunctivae normal.  Cardiovascular:     Pulses: Normal pulses.  Pulmonary:     Effort: Pulmonary effort is normal.  Musculoskeletal:     Cervical back: Neck supple.  Lymphadenopathy:     Cervical: No cervical adenopathy.  Skin:    General: Skin is dry.     Findings: No erythema or rash.  Neurological:     Mental Status: He is alert and oriented to person, place, and time.  Psychiatric:        Behavior: Behavior normal.     ED Results / Procedures / Treatments   Labs (all labs ordered are listed, but only abnormal results are displayed) Labs Reviewed - No data to display  EKG None  Radiology No results found.  Procedures .Epistaxis Management  Date/Time: 02/02/2021 10:52 AM Performed by: Tacy Learn, PA-C Authorized by:  Tacy Learn, PA-C   Consent:    Consent obtained:  Verbal   Consent given by:  Patient   Risks, benefits, and alternatives were discussed: yes  Risks discussed:  Bleeding, nasal injury and pain   Alternatives discussed:  No treatment Universal protocol:    Patient identity confirmed:  Verbally with patient Anesthesia:    Anesthesia method:  None Procedure details:    Treatment site:  L anterior   Repair method: Afrin, direct pressure.   Treatment complexity:  Limited   Treatment episode: initial   Post-procedure details:    Assessment:  Bleeding stopped   Procedure completion:  Tolerated well, no immediate complications     Medications Ordered in ED Medications  oxymetazoline (AFRIN) 0.05 % nasal spray 1 spray (1 spray Each Nare Given 02/02/21 0900)    ED Course  I have reviewed the triage vital signs and the nursing notes.  Pertinent labs & imaging results that were available during my care of the patient were reviewed by me and considered in my medical decision making (see chart for details).  Clinical Course as of 02/02/21 1053  Mon Feb 02, 4861  35 70 year old male here with nosebleed.  No distress.  Not on anticoagulation.  Getting symptomatic treatment and reassessment. [MB]  1050 On arrival, bleeding from left side of nose.  Patient was instructed to blow his nose, clots removed, Afrin applied and pressure was held for 15 minutes. Bleeding resolved after 15 minutes.  On visualization, no obvious bleeding, no areas requiring cauterization. Patient was monitored in the department for 2 hours, blood pressure remains elevated, bleeding remains controlled. While in the emergency room, patient's wife had a syncopal event, patient believes his blood pressure is elevated due to his concern for her at this time.  Patient has arranged for follow-up with ENT, will return to the ER as needed, understands plan for if nosebleed should return otherwise we will monitor his blood  pressure and follow-up with his PCP as needed for this. [LM]    Clinical Course User Index [LM] Tacy Learn, PA-C [MB] Hayden Rasmussen, MD   MDM Rules/Calculators/A&P                          Final Clinical Impression(s) / ED Diagnoses Final diagnoses:  Left-sided epistaxis    Rx / DC Orders ED Discharge Orders    None       Roque Lias 02/02/21 1053    Hayden Rasmussen, MD 02/02/21 1753

## 2021-02-02 NOTE — ED Notes (Signed)
Bleed controlled at this time

## 2021-02-02 NOTE — ED Notes (Signed)
No bleeding noted. Pt alert and oriented

## 2021-02-02 NOTE — ED Triage Notes (Signed)
Patient coming from home. Complaint of nosebleed that began approximately 30 minutes ago. Patient reports brief nosebleed yesterday that resolved itself.

## 2021-02-02 NOTE — Discharge Instructions (Addendum)
Avoid blowing or rubbing your nose as much as possible.  Discontinue Flonase for the time being.  Follow-up with ENT as planned.  Return to the emergency room as needed.  If bleeding resumes, blow out the clots, spray the Afrin, pinch the soft part of your nose shut for 15 minutes.  If bleeding is not controlled, return to the emergency room or see ENT.  Monitor your blood pressure at home, follow-up with your doctor if blood pressure remains elevated.

## 2021-02-04 ENCOUNTER — Ambulatory Visit
Admission: RE | Admit: 2021-02-04 | Discharge: 2021-02-04 | Disposition: A | Discharge status: Home / Self Care | Payer: PPO | Source: Ambulatory Visit | Attending: Neurology | Admitting: Neurology

## 2021-02-04 DIAGNOSIS — M25661 Stiffness of right knee, not elsewhere classified: Secondary | ICD-10-CM | POA: Diagnosis not present

## 2021-02-04 DIAGNOSIS — G3184 Mild cognitive impairment, so stated: Secondary | ICD-10-CM

## 2021-02-04 DIAGNOSIS — M25561 Pain in right knee: Secondary | ICD-10-CM | POA: Diagnosis not present

## 2021-02-04 DIAGNOSIS — M6281 Muscle weakness (generalized): Secondary | ICD-10-CM | POA: Diagnosis not present

## 2021-02-04 DIAGNOSIS — G4733 Obstructive sleep apnea (adult) (pediatric): Secondary | ICD-10-CM

## 2021-02-04 DIAGNOSIS — M1711 Unilateral primary osteoarthritis, right knee: Secondary | ICD-10-CM | POA: Diagnosis not present

## 2021-02-04 DIAGNOSIS — G4730 Sleep apnea, unspecified: Secondary | ICD-10-CM

## 2021-02-04 DIAGNOSIS — Z5189 Encounter for other specified aftercare: Secondary | ICD-10-CM

## 2021-02-04 DIAGNOSIS — I420 Dilated cardiomyopathy: Secondary | ICD-10-CM

## 2021-02-04 MED ORDER — GADOBENATE DIMEGLUMINE 529 MG/ML IV SOLN
20.0000 mL | Freq: Once | INTRAVENOUS | Status: AC | PRN
  Administered 2021-02-04: 20 mL via INTRAVENOUS

## 2021-02-06 NOTE — Progress Notes (Signed)
Reason for brain MRI were episodic memory loss, executive and visio-spatial function delay.  IMPRESSION: Unremarkable MRI brain (with and without).  No acute findings.  (This means no scars, tumors, bleed stroke- CD)    INTERPRETING PHYSICIAN:  Penni Bombard, MD

## 2021-02-09 DIAGNOSIS — E291 Testicular hypofunction: Secondary | ICD-10-CM | POA: Diagnosis not present

## 2021-02-09 DIAGNOSIS — H401132 Primary open-angle glaucoma, bilateral, moderate stage: Secondary | ICD-10-CM | POA: Diagnosis not present

## 2021-02-10 ENCOUNTER — Emergency Department (HOSPITAL_COMMUNITY)
Admission: EM | Admit: 2021-02-10 | Discharge: 2021-02-10 | Disposition: A | Discharge status: Home / Self Care | Payer: PPO | Attending: Emergency Medicine | Admitting: Emergency Medicine

## 2021-02-10 DIAGNOSIS — Z79899 Other long term (current) drug therapy: Secondary | ICD-10-CM | POA: Diagnosis not present

## 2021-02-10 DIAGNOSIS — Z8551 Personal history of malignant neoplasm of bladder: Secondary | ICD-10-CM | POA: Diagnosis not present

## 2021-02-10 DIAGNOSIS — Z7982 Long term (current) use of aspirin: Secondary | ICD-10-CM | POA: Insufficient documentation

## 2021-02-10 DIAGNOSIS — R040 Epistaxis: Secondary | ICD-10-CM | POA: Insufficient documentation

## 2021-02-10 DIAGNOSIS — I10 Essential (primary) hypertension: Secondary | ICD-10-CM | POA: Insufficient documentation

## 2021-02-10 DIAGNOSIS — I2510 Atherosclerotic heart disease of native coronary artery without angina pectoris: Secondary | ICD-10-CM | POA: Insufficient documentation

## 2021-02-10 NOTE — Discharge Instructions (Addendum)
Please call the [facility] ear nose and throat office today and schedule an appointment for follow-up.  Do not take the packing out your nose until you see ENT.  Return to the ER for any rebleeding.

## 2021-02-10 NOTE — ED Triage Notes (Signed)
C/o nose bleed started around 0630 am, continouos. Stated he takes ASA 81 mg daily.

## 2021-02-10 NOTE — ED Notes (Signed)
Patient comfortably seating in Triage 1, bilateral nose bleed appears resolved at the moment with placement of rhino rocket intact,

## 2021-02-10 NOTE — ED Provider Notes (Signed)
I provided a substantive portion of the care of this patient.  I personally performed the entirety of the history, exam and medical decision making for this encounter.     Patient seen by me along with physician assistant.  Patient was seen March 14 for nosebleed.  Was treated with Afrin and the bleeding was controlled.  Patient was trying to get follow-up with an ear nose and throat he knows in [place].  But is not able to be seen until April timeframe.  Patient started with recurrent nosebleed from both nares around 630 this morning.  It was continuous.  Triage nurse applied some packing to the nose.  Bleeding is now stopped.  She applied packing to both nares.  No blood going down the back of the throat.  Patient is in agreement with following up with ear nose and throat locally.  Which should happen more timely.  Packing in both nares no bleeding from the nose no bleeding from the back of the throat.  Patient stable for discharge home and follow-up with ear nose and throat   Fredia Sorrow, MD 02/10/21 1226

## 2021-02-10 NOTE — ED Notes (Signed)
Rhinorocket applied to bilateral nares, medium on left, small on right.

## 2021-02-10 NOTE — ED Provider Notes (Signed)
[facility] EMERGENCY DEPARTMENT Provider Note   CSN: [id number] Arrival date & time: 02/10/21  3354     History Chief Complaint  Patient presents with  . Epistaxis    Brian Cortez is a 70 y.o. male.  HPI 70 year old male with a history of bladder tumor, CAD, dilated cardiomyopathy, GERD, hypertension, hyperlipidemia presents to the ER with complaints of a nosebleed.  Patient states that he was seen here on 3/14, with a persistent nosebleed.  He takes 81 mg of aspirin.  States that this improved with Afrin.  He states he started to have bleeding again at around 6:30 AM this morning, which did not improve after using Afrin.  Triage nurse placed bilateral nasal packing.  Bleeding controlled at this time.  Denies any headache, vision changes, chest pain, shortness of breath.    Past Medical History:  Diagnosis Date  . Anxiety   . Bladder tumor   . Carpal tunnel syndrome, left    intermittant  . Chronic seasonal allergic rhinitis   . Coronary artery disease    per Dr Aundra Dubin (cardiologist) note --- noted on CT chest coronary calcification -- ETT-Cardiolite normal in 2014  . DDD (degenerative disc disease), cervical   . Dilated cardiomyopathy ([place])   . Diverticulosis of colon   . Fecal incontinence   . GERD (gastroesophageal reflux disease)   . Grade II diastolic dysfunction 56/25/6389   per ECHO   . Hemorrhoids   . Hiatal hernia   . History of adenomatous polyp of colon    2001;  2007;  2012  . History of bladder cancer urologist-  dr Gaynelle Arabian   11-06-2013  s/p TURBT and BCG tx's  . History of kidney stones    about 45 years ago  . History of panic attacks   . History of urethral stricture   . Hyperlipidemia   . Hypertension   . Hypogonadism in male   . LVH (left ventricular hypertrophy) 06/17/2017   Mild, noted on ECHO  . Nonischemic cardiomyopathy [facility])    cardiologist-  dr Aundra Dubin-- per last echo 01/ 2016 ef 50-55%  (up from 09/ 2014 ef  45-50%)  . OSA on CPAP    since 2014  . Pneumonia 11/2010   Left lower lobe  . Pulmonary hypertension ([place]) 06/17/2017   Mild, per ECHO    Patient Active Problem List   Diagnosis Date Noted  . Myopic macular degeneration 04/02/2020  . Posterior vitreous detachment of right eye 04/02/2020  . Early stage nonexudative age-related macular degeneration of both eyes 04/02/2020  . 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 Neoplastic malignant related fatigue 09/20/2018  . Convalescence after chemotherapy 09/20/2018  . Anterior urethral stricture 08/04/2018  . Bladder trabeculation 08/04/2018  . Diverticula, bladder acquired 08/04/2018  . Erectile dysfunction due to diseases classified elsewhere 08/04/2018  . Low testosterone in male 08/04/2018  . Primary open angle glaucoma (POAG) of both eyes, moderate stage 02/10/2018  . Obesity with body mass index greater than 30 09/14/2017  . Nasal congestion with rhinorrhea 09/08/2016  . Chronic seasonal allergic rhinitis due to pollen 09/08/2016  . Acute recurrent ethmoidal sinusitis 09/09/2015  . Bronchitis 09/09/2015  . OSA on CPAP 09/09/2015  . Rhinitis due to pollen 09/09/2015  . CAP (community acquired pneumonia) 10/15/2014  . Allergic rhinitis 10/15/2014  . Atelectasis 09/30/2014  . Abnormal weight loss 09/06/2014  . Cough 09/06/2014  . CPAP rhinitis 08/27/2014  . Anxiety disorder 03/15/2014  .  Colorectal polyps 03/15/2014  . Glaucoma 03/15/2014  . Screening for depression 03/15/2014  . Bladder cancer ([place]) 11/06/2013  . Sleep apnea with use of continuous positive airway pressure (CPAP) 08/23/2013  . Coronary artery calcification seen on CAT scan 08/17/2013  . Cardiomyopathy ([place]) 08/17/2013  . Diastolic dysfunction 03/47/4259  . Testosterone deficiency 10/18/2012  . CARPAL TUNNEL SYNDROME, LEFT 03/12/2010  . CERVICAL RADICULOPATHY 03/12/2010  . NONSPECIFIC ABNORMAL ELECTROCARDIOGRAM 10/08/2009  . ADENOMATOUS  COLONIC POLYP 04/06/2008  . DIVERTICULOSIS, COLON 04/06/2008  . HIATAL HERNIA 12/18/2007  . HYPERLIPIDEMIA 10/20/2007  . Essential hypertension 05/30/2007  . G E R D 05/30/2007  . HYPERGLYCEMIA 12/19/2006    Past Surgical History:  Procedure Laterality Date  . CARDIOVASCULAR STRESS TEST  08-21-2013  dr Aundra Dubin   normal nuclear study w/ no ischemia/  normal LV function and wall motion , ef 63%  . CATARACT EXTRACTION W/ INTRAOCULAR LENS  IMPLANT, BILATERAL  2013  . COLONOSCOPY  last one 03-03-2016  . CYSTO/  DIRECT VISION INTERNAL URETEROTOMY  11/25/2006  . KNEE ARTHROSCOPY  2022  . LAPAROSCOPIC CHOLECYSTECTOMY  10/15/2006  . ORBITAL FRACTURE SURGERY  1986   left eye:  Jan 1986--- fixation of fractures, repair of optic nerve decompression , and complex closure  . TONSILLECTOMY  child  . TRANSTHORACIC ECHOCARDIOGRAM  12-16-2014   dr Aundra Dubin   grade 1 diastolic dysfunction, ef 56-38% (improved from last study in 2014 , previous ef 45-50%) /  trivial MR and TR/  mild LAE/  inferior vena cava dilated, respirophasic diameter changes were blunted (<50%), consistant w/ elevated central venous pressure  . TRANSURETHRAL RESECTION OF BLADDER TUMOR Bilateral 05/16/2018   Procedure: TRANSURETHRAL RESECTION OF BLADDER TUMOR (TURBT)/ RETROGRADE;  Surgeon: Festus Aloe, MD;  Location[facility];  Service: Urology;  Laterality: Bilateral;  . TRANSURETHRAL RESECTION OF BLADDER TUMOR WITH GYRUS (TURBT-GYRUS) N/A 11/06/2013   Procedure: TRANSURETHRAL RESECTION OF BLADDER TUMOR WITH GYRUS (TURBT-GYRUS);  Surgeon: Ailene Rud, MD;  Location: [facility];  Service: Urology;  Laterality: N/A;  . TRANSURETHRAL RESECTION OF BLADDER TUMOR WITH MITOMYCIN-C N/A 12/09/2016   Procedure: CYSTOSCOPY TRANSURETHRAL RESECTION OF BLADDER TUMOR WITH MITOMYCIN-C;  Surgeon: Carolan Clines, MD;  Location: [place];  Service: Urology;  Laterality: N/A;  . VARICOSE VEIN SURGERY  2002  .  VASECTOMY         Family History  Problem Relation Age of Onset  . Heart attack Father 42  . Stroke Father 42  . Heart attack Paternal Grandfather        in 76s  . Prostate cancer Paternal Uncle   . Heart attack Paternal Uncle         > 55  . Heart attack Maternal Uncle 80       > 55  . Colon cancer Neg Hx   . Diabetes Neg Hx     Social History   Tobacco Use  . Smoking status: Never Smoker  . Smokeless tobacco: Never Used  Vaping Use  . Vaping Use: Never used  Substance Use Topics  . Alcohol use: Yes    Alcohol/week: 0.0 standard drinks    Comment: occasional  . Drug use: No    Home Medications Prior to Admission medications   Medication Sig Start Date End Date Taking? Authorizing Provider  allopurinol (ZYLOPRIM) 300 MG tablet Take 300 mg by mouth daily. 05/28/19   [provider]  anastrozole (ARIMIDEX) 1 MG tablet Take 2 mg by mouth daily.  12/05/20   [provider]  aspirin 81 MG tablet Take 81 mg by mouth every evening.    [provider]  atorvastatin (LIPITOR) 40 MG tablet Take 40 mg by mouth every evening.     [provider]  bimatoprost (LUMIGAN) 0.01 % SOLN INSTILL ONE DROP IN [facility] EYE AT BEDTIME 07/31/18   [provider]  escitalopram (LEXAPRO) 20 MG tablet Take 20 mg by mouth every morning.     [provider]  fenofibrate 160 MG tablet Take 1 tablet (160 mg total) by mouth daily. 07/08/20   Sueanne Margarita, MD  fluticasone (FLONASE) 50 MCG/ACT nasal spray FLUTICASONE PROPIONATE 50 MCG/ACT SUSP---  bid prn 03/15/14   [provider]  Multiple Vitamins-Minerals (PRESERVISION AREDS 2 PO) Take 1 capsule by mouth 2 (two) times daily.     [provider]  Omega-3 Fatty Acids (FISH OIL) 1000 MG CAPS Take 2 capsules (2,000 mg total) by mouth 2 (two) times daily. 09/05/20   Sueanne Margarita, MD  pantoprazole (PROTONIX) 40 MG tablet TAKE ONE TABLET DAILY 01/09/21   Irene Shipper, MD  timolol (BETIMOL)  0.5 % ophthalmic solution Place 1 drop into the right eye daily.    [provider]    Allergies    Pollen extract and Sulfamethoxazole-trimethoprim  Review of Systems   Review of Systems  HENT: Positive for nosebleeds.   Neurological: Negative for headaches.    Physical Exam Updated Vital Signs BP (!) 161/101 (BP Location: Right Arm)   Pulse 64   Temp 97.8 F (36.6 C) (Oral)   Resp 13   SpO2 99%   Physical Exam Vitals and nursing note reviewed.  Constitutional:      Appearance: He is well-developed.     Comments: Well-appearing  HENT:     Head: Normocephalic and atraumatic.     Nose:     Comments: Bilateral nares with packing.  Bleeding controlled. Eyes:     Conjunctiva/sclera: Conjunctivae normal.  Cardiovascular:     Rate and Rhythm: Normal rate and regular rhythm.     Heart sounds: No murmur heard.   Pulmonary:     Effort: Pulmonary effort is normal. No respiratory distress.     Breath sounds: Normal breath sounds.  Abdominal:     Palpations: Abdomen is soft.     Tenderness: There is no abdominal tenderness.  Musculoskeletal:     Cervical back: Neck supple.  Skin:    General: Skin is warm and dry.  Neurological:     Mental Status: He is alert.     ED Results / Procedures / Treatments   Labs (all labs ordered are listed, but only abnormal results are displayed) Labs Reviewed - No data to display  EKG None  Radiology No results found.  Procedures Procedures   Medications Ordered in ED Medications - No data to display  ED Course  I have reviewed the triage vital signs and the nursing notes.  Pertinent labs & imaging results that were available during my care of the patient were reviewed by me and considered in my medical decision making (see chart for details).    MDM Rules/Calculators/A&P                          70 year old male who presents with nosebleed.  Bleeding controlled with bilateral nasal packing.  Not on any  anticoagulation other than 81 mg of aspirin.  Spoke with [place]  ENT nurse on-call, she recommends the patient call the phone number and schedule an appointment.  We discussed return precautions.  Stressed follow-up with ENT ASAP.  He voiced understanding is agreeable.  Stable for discharge.  This was a shared visit with my supervising physician Dr. Rogene Houston who independently saw and evaluated the patient & provided guidance in evaluation/management/disposition ,in agreement with care  Final Clinical Impression(s) / ED Diagnoses Final diagnoses:  Epistaxis    Rx / DC Orders ED Discharge Orders    None       Lyndel Safe 02/10/21 1250    Fredia Sorrow, MD 02/14/21 [phone]

## 2021-02-13 DIAGNOSIS — J342 Deviated nasal septum: Secondary | ICD-10-CM | POA: Insufficient documentation

## 2021-02-13 DIAGNOSIS — H6123 Impacted cerumen, bilateral: Secondary | ICD-10-CM | POA: Insufficient documentation

## 2021-02-13 DIAGNOSIS — R040 Epistaxis: Secondary | ICD-10-CM | POA: Insufficient documentation

## 2021-02-13 DIAGNOSIS — H6122 Impacted cerumen, left ear: Secondary | ICD-10-CM | POA: Diagnosis not present

## 2021-02-13 DIAGNOSIS — H6121 Impacted cerumen, right ear: Secondary | ICD-10-CM | POA: Diagnosis not present

## 2021-02-16 DIAGNOSIS — R040 Epistaxis: Secondary | ICD-10-CM | POA: Diagnosis not present

## 2021-02-17 DIAGNOSIS — M25661 Stiffness of right knee, not elsewhere classified: Secondary | ICD-10-CM | POA: Diagnosis not present

## 2021-02-17 DIAGNOSIS — S83281D Other tear of lateral meniscus, current injury, right knee, subsequent encounter: Secondary | ICD-10-CM | POA: Diagnosis not present

## 2021-02-17 DIAGNOSIS — M6281 Muscle weakness (generalized): Secondary | ICD-10-CM | POA: Diagnosis not present

## 2021-02-17 DIAGNOSIS — M25561 Pain in right knee: Secondary | ICD-10-CM | POA: Diagnosis not present

## 2021-02-23 DIAGNOSIS — M25561 Pain in right knee: Secondary | ICD-10-CM | POA: Diagnosis not present

## 2021-02-25 DIAGNOSIS — M6281 Muscle weakness (generalized): Secondary | ICD-10-CM | POA: Diagnosis not present

## 2021-02-25 DIAGNOSIS — S83281D Other tear of lateral meniscus, current injury, right knee, subsequent encounter: Secondary | ICD-10-CM | POA: Diagnosis not present

## 2021-02-25 DIAGNOSIS — M1711 Unilateral primary osteoarthritis, right knee: Secondary | ICD-10-CM | POA: Diagnosis not present

## 2021-02-25 DIAGNOSIS — M25661 Stiffness of right knee, not elsewhere classified: Secondary | ICD-10-CM | POA: Diagnosis not present

## 2021-02-26 DIAGNOSIS — G4733 Obstructive sleep apnea (adult) (pediatric): Secondary | ICD-10-CM | POA: Diagnosis not present

## 2021-02-26 DIAGNOSIS — E291 Testicular hypofunction: Secondary | ICD-10-CM | POA: Diagnosis not present

## 2021-03-09 DIAGNOSIS — M1711 Unilateral primary osteoarthritis, right knee: Secondary | ICD-10-CM | POA: Diagnosis not present

## 2021-03-09 DIAGNOSIS — M25561 Pain in right knee: Secondary | ICD-10-CM | POA: Diagnosis not present

## 2021-03-11 DIAGNOSIS — M25561 Pain in right knee: Secondary | ICD-10-CM | POA: Diagnosis not present

## 2021-03-11 DIAGNOSIS — M6281 Muscle weakness (generalized): Secondary | ICD-10-CM | POA: Diagnosis not present

## 2021-03-11 DIAGNOSIS — M25661 Stiffness of right knee, not elsewhere classified: Secondary | ICD-10-CM | POA: Diagnosis not present

## 2021-03-11 DIAGNOSIS — S83281D Other tear of lateral meniscus, current injury, right knee, subsequent encounter: Secondary | ICD-10-CM | POA: Diagnosis not present

## 2021-03-12 DIAGNOSIS — E291 Testicular hypofunction: Secondary | ICD-10-CM | POA: Diagnosis not present

## 2021-03-15 ENCOUNTER — Encounter: Payer: Self-pay | Admitting: Neurology

## 2021-03-16 DIAGNOSIS — M1711 Unilateral primary osteoarthritis, right knee: Secondary | ICD-10-CM | POA: Diagnosis not present

## 2021-03-18 DIAGNOSIS — M25661 Stiffness of right knee, not elsewhere classified: Secondary | ICD-10-CM | POA: Diagnosis not present

## 2021-03-18 DIAGNOSIS — S83281D Other tear of lateral meniscus, current injury, right knee, subsequent encounter: Secondary | ICD-10-CM | POA: Diagnosis not present

## 2021-03-18 DIAGNOSIS — M1711 Unilateral primary osteoarthritis, right knee: Secondary | ICD-10-CM | POA: Diagnosis not present

## 2021-03-18 DIAGNOSIS — M6281 Muscle weakness (generalized): Secondary | ICD-10-CM | POA: Diagnosis not present

## 2021-03-23 DIAGNOSIS — E291 Testicular hypofunction: Secondary | ICD-10-CM | POA: Diagnosis not present

## 2021-03-26 ENCOUNTER — Other Ambulatory Visit: Payer: Self-pay

## 2021-03-26 ENCOUNTER — Ambulatory Visit: Payer: PPO | Attending: Internal Medicine

## 2021-03-26 ENCOUNTER — Other Ambulatory Visit (HOSPITAL_BASED_OUTPATIENT_CLINIC_OR_DEPARTMENT_OTHER): Payer: Self-pay

## 2021-03-26 DIAGNOSIS — Z23 Encounter for immunization: Secondary | ICD-10-CM

## 2021-03-26 MED ORDER — PFIZER-BIONT COVID-19 VAC-TRIS 30 MCG/0.3ML IM SUSP
INTRAMUSCULAR | 0 refills | Status: DC
  Filled 2021-03-26: qty 0.3, 1d supply, fill #0

## 2021-03-26 NOTE — Progress Notes (Signed)
   Covid-19 Vaccination Clinic  Name:  NASHTON BELSON    MRN: [id number] DOB: 09/19/1951  03/26/2021  Mr. Fults was observed post Covid-19 immunization for 15 minutes without incident. He was provided with Vaccine Information Sheet and instruction to access the V-Safe system.   Mr. Blackwell was instructed to call 911 with any severe reactions post vaccine: Marland Kitchen Difficulty breathing  . Swelling of face and throat  . A fast heartbeat  . A bad rash all over body  . Dizziness and weakness   Immunizations Administered    Name Date Dose VIS Date Route   PFIZER Comrnaty(Gray TOP) Covid-19 Vaccine 03/26/2021  1:27 PM 0.3 mL 10/30/2020 Intramuscular   Manufacturer: [place]   Lot: [id number]   NDC: [phone]

## 2021-03-27 ENCOUNTER — Other Ambulatory Visit: Payer: Self-pay

## 2021-03-27 ENCOUNTER — Ambulatory Visit: Payer: PPO | Admitting: Podiatry

## 2021-03-27 DIAGNOSIS — B351 Tinea unguium: Secondary | ICD-10-CM

## 2021-03-27 MED ORDER — FLUCONAZOLE 150 MG PO TABS: 150.0000 mg | ORAL_TABLET | ORAL | 1 refills | Status: DC

## 2021-03-27 NOTE — Progress Notes (Signed)
  Subjective:  Patient ID: Brian Cortez, male    DOB: [birth year],  MRN: [id number]  Chief Complaint  Patient presents with  . Nail Problem    Right 1st toenail painful around borders. Right 1st/5th nail discolored.    70 y.o. male presents with the above complaint. History confirmed with patient.   Objective:  Physical Exam: warm, good capillary refill, no trophic changes or ulcerative lesions, normal DP and PT pulses and normal sensory exam. Right 1st nail thickening, yellow discoloration, crumbly texture. Assessment:   1. Onychomycosis      Plan:  Patient was evaluated and treated and all questions answered.  Onychomycosis  -Educated on etioogy -Nail debrided, courtesy -Rx fluconazole weekly. -F/u in 6 weeks to assess progress.  No follow-ups on file.

## 2021-04-01 DIAGNOSIS — M25561 Pain in right knee: Secondary | ICD-10-CM | POA: Diagnosis not present

## 2021-04-01 DIAGNOSIS — M25661 Stiffness of right knee, not elsewhere classified: Secondary | ICD-10-CM | POA: Diagnosis not present

## 2021-04-01 DIAGNOSIS — M6281 Muscle weakness (generalized): Secondary | ICD-10-CM | POA: Diagnosis not present

## 2021-04-01 DIAGNOSIS — S83281D Other tear of lateral meniscus, current injury, right knee, subsequent encounter: Secondary | ICD-10-CM | POA: Diagnosis not present

## 2021-04-02 ENCOUNTER — Other Ambulatory Visit: Payer: Self-pay

## 2021-04-02 ENCOUNTER — Encounter (INDEPENDENT_AMBULATORY_CARE_PROVIDER_SITE_OTHER): Payer: Self-pay | Admitting: Ophthalmology

## 2021-04-02 ENCOUNTER — Ambulatory Visit (INDEPENDENT_AMBULATORY_CARE_PROVIDER_SITE_OTHER): Payer: PPO | Admitting: Ophthalmology

## 2021-04-02 DIAGNOSIS — H3530 Unspecified macular degeneration: Secondary | ICD-10-CM | POA: Diagnosis not present

## 2021-04-02 DIAGNOSIS — H43811 Vitreous degeneration, right eye: Secondary | ICD-10-CM

## 2021-04-02 DIAGNOSIS — H353131 Nonexudative age-related macular degeneration, bilateral, early dry stage: Secondary | ICD-10-CM

## 2021-04-02 DIAGNOSIS — H401130 Primary open-angle glaucoma, bilateral, stage unspecified: Secondary | ICD-10-CM

## 2021-04-02 NOTE — Assessment & Plan Note (Signed)
Minor no high risk features, does not reach the level of requiring oral vitamin supplementation

## 2021-04-02 NOTE — Assessment & Plan Note (Signed)
Continue under the care of Dr. Dionne Ano for advanced open-angle glaucoma changes left eye and less so right eye

## 2021-04-02 NOTE — Progress Notes (Signed)
04/02/2021     CHIEF COMPLAINT Patient presents for Retina Follow Up (1 yr fu OU and OCT/Pt states VA OU stable since last visit. Pt denies FOL, floaters, or ocular pain OU. /Pt reports taking Timolol Qday OD and Lumigan QHS OU)   HISTORY OF PRESENT ILLNESS: Brian Cortez is a 70 y.o. male who presents to the clinic today for:   HPI    Retina Follow Up    Diagnosis: Other   Laterality: both eyes   Onset: 1 year ago   Severity: mild   Duration: 1 year   Course: stable   Comments: 1 yr fu OU and OCT Pt states VA OU stable since last visit. Pt denies FOL, floaters, or ocular pain OU.  Pt reports taking Timolol Qday OD and Lumigan QHS OU       Last edited by Kendra Opitz, COA on 04/02/2021 10:47 AM. (History)      Referring physician: Prince Solian, MD [place],  [place] [address]  HISTORICAL INFORMATION:   Selected notes from the MEDICAL RECORD NUMBER    Lab Results  Component Value Date   HGBA1C 5.7 01/30/2010     CURRENT MEDICATIONS: Current Outpatient Medications (Ophthalmic Drugs)  Medication Sig  . bimatoprost (LUMIGAN) 0.01 % SOLN INSTILL ONE DROP IN [facility] EYE AT BEDTIME  . timolol (BETIMOL) 0.5 % ophthalmic solution Place 1 drop into the right eye daily.  . timolol (TIMOPTIC) 0.5 % ophthalmic solution Place 1 drop into the right eye daily.   No current facility-administered medications for this visit. (Ophthalmic Drugs)   Current Outpatient Medications (Other)  Medication Sig  . allopurinol (ZYLOPRIM) 300 MG tablet Take 300 mg by mouth daily.  Marland Kitchen anastrozole (ARIMIDEX) 1 MG tablet Take 2 mg by mouth daily.  Marland Kitchen aspirin 81 MG tablet Take 81 mg by mouth every evening.  Marland Kitchen aspirin EC 81 MG tablet Take 81 mg by mouth 2 (two) times daily.  Marland Kitchen atorvastatin (LIPITOR) 40 MG tablet Take 40 mg by mouth every evening.   Marland Kitchen COVID-19 mRNA Vac-TriS, Pfizer, (PFIZER-BIONT COVID-19 VAC-TRIS) SUSP injection Inject into the muscle.  . diclofenac (VOLTAREN)  75 MG EC tablet Take 75 mg by mouth 2 (two) times daily as needed.  Marland Kitchen escitalopram (LEXAPRO) 20 MG tablet Take 20 mg by mouth every morning.   . fenofibrate 160 MG tablet Take 1 tablet (160 mg total) by mouth daily.  . fluconazole (DIFLUCAN) 150 MG tablet Take 1 tablet (150 mg total) by mouth once a week.  . fluticasone (FLONASE) 50 MCG/ACT nasal spray FLUTICASONE PROPIONATE 50 MCG/ACT SUSP---  bid prn  . HYDROcodone-acetaminophen (NORCO/VICODIN) 5-325 MG tablet Take 1 tablet by mouth every 4 (four) hours as needed.  Marland Kitchen ketorolac (TORADOL) 60 MG/2ML SOLN injection Inject 60 mg into the muscle once.  . methylPREDNISolone acetate (DEPO-MEDROL) 40 MG/ML injection Inject 80 mg into the muscle once.  . Multiple Vitamins-Minerals (PRESERVISION AREDS 2 PO) Take 1 capsule by mouth 2 (two) times daily.   . Omega-3 Fatty Acids (FISH OIL) 1000 MG CAPS Take 2 capsules (2,000 mg total) by mouth 2 (two) times daily.  . pantoprazole (PROTONIX) 40 MG tablet TAKE ONE TABLET DAILY  . testosterone cypionate (DEPOTESTOSTERONE CYPIONATE) 200 MG/ML injection Inject into the muscle.   No current facility-administered medications for this visit. (Other)      REVIEW OF SYSTEMS:    ALLERGIES Allergies  Allergen Reactions  . Pollen Extract Cough    "sinus issues"  .  Sulfamethoxazole-Trimethoprim Rash    PAST MEDICAL HISTORY Past Medical History:  Diagnosis Date  . Anxiety   . Bladder tumor   . Carpal tunnel syndrome, left    intermittant  . Chronic seasonal allergic rhinitis   . Coronary artery disease    per Dr Aundra Dubin (cardiologist) note --- noted on CT chest coronary calcification -- ETT-Cardiolite normal in 2014  . DDD (degenerative disc disease), cervical   . Dilated cardiomyopathy ([place])   . Diverticulosis of colon   . Fecal incontinence   . GERD (gastroesophageal reflux disease)   . Grade II diastolic dysfunction A999333   per ECHO   . Hemorrhoids   . Hiatal hernia   . History of  adenomatous polyp of colon    2001;  2007;  2012  . History of bladder cancer urologist-  dr Gaynelle Arabian   11-06-2013  s/p TURBT and BCG tx's  . History of kidney stones    about 45 years ago  . History of panic attacks   . History of urethral stricture   . Hyperlipidemia   . Hypertension   . Hypogonadism in male   . LVH (left ventricular hypertrophy) 06/17/2017   Mild, noted on ECHO  . Nonischemic cardiomyopathy [facility])    cardiologist-  dr Aundra Dubin-- per last echo 01/ 2016 ef 50-55%  (up from 09/ 2014 ef 45-50%)  . OSA on CPAP    since 2014  . Pneumonia 11/2010   Left lower lobe  . Pulmonary hypertension ([place]) 06/17/2017   Mild, per ECHO   Past Surgical History:  Procedure Laterality Date  . CARDIOVASCULAR STRESS TEST  08-21-2013  dr Aundra Dubin   normal nuclear study w/ no ischemia/  normal LV function and wall motion , ef 63%  . CATARACT EXTRACTION W/ INTRAOCULAR LENS  IMPLANT, BILATERAL  2013  . COLONOSCOPY  last one 03-03-2016  . CYSTO/  DIRECT VISION INTERNAL URETEROTOMY  11/25/2006  . KNEE ARTHROSCOPY  2022  . LAPAROSCOPIC CHOLECYSTECTOMY  10/15/2006  . ORBITAL FRACTURE SURGERY  1986   left eye:  Jan 1986--- fixation of fractures, repair of optic nerve decompression , and complex closure  . TONSILLECTOMY  child  . TRANSTHORACIC ECHOCARDIOGRAM  12-16-2014   dr Aundra Dubin   grade 1 diastolic dysfunction, ef 99991111 (improved from last study in 2014 , previous ef 45-50%) /  trivial MR and TR/  mild LAE/  inferior vena cava dilated, respirophasic diameter changes were blunted (<50%), consistant w/ elevated central venous pressure  . TRANSURETHRAL RESECTION OF BLADDER TUMOR Bilateral 05/16/2018   Procedure: TRANSURETHRAL RESECTION OF BLADDER TUMOR (TURBT)/ RETROGRADE;  Surgeon: Festus Aloe, MD;  Location[facility];  Service: Urology;  Laterality: Bilateral;  . TRANSURETHRAL RESECTION OF BLADDER TUMOR WITH GYRUS (TURBT-GYRUS) N/A 11/06/2013   Procedure: TRANSURETHRAL  RESECTION OF BLADDER TUMOR WITH GYRUS (TURBT-GYRUS);  Surgeon: Ailene Rud, MD;  Location[facility];  Service: Urology;  Laterality: N/A;  . TRANSURETHRAL RESECTION OF BLADDER TUMOR WITH MITOMYCIN-C N/A 12/09/2016   Procedure: CYSTOSCOPY TRANSURETHRAL RESECTION OF BLADDER TUMOR WITH MITOMYCIN-C;  Surgeon: Carolan Clines, MD;  Location: [place];  Service: Urology;  Laterality: N/A;  . VARICOSE VEIN SURGERY  2002  . VASECTOMY      FAMILY HISTORY Family History  Problem Relation Age of Onset  . Heart attack Father 41  . Stroke Father 44  . Heart attack Paternal Grandfather        in 11s  . Prostate cancer Paternal Uncle   .  Heart attack Paternal Uncle         > 55  . Heart attack Maternal Uncle 80       > 55  . Colon cancer Neg Hx   . Diabetes Neg Hx     SOCIAL HISTORY Social History   Tobacco Use  . Smoking status: Never Smoker  . Smokeless tobacco: Never Used  Vaping Use  . Vaping Use: Never used  Substance Use Topics  . Alcohol use: Yes    Alcohol/week: 0.0 standard drinks    Comment: occasional  . Drug use: No         OPHTHALMIC EXAM: Base Eye Exam    Visual Acuity (ETDRS)      Right Left   Dist cc 20/20 -1 20/20 -1   Correction: Glasses       Tonometry (Tonopen, 10:49 AM)      Right Left   Pressure 14 14       Pupils      Pupils Dark Light Shape React APD   Right PERRL 4 3 Round Brisk None   Left PERRL 4 3 Round Brisk None       Visual Fields (Counting fingers)      Left Right    Full Full       Extraocular Movement      Right Left    Full Full       Neuro/Psych    Oriented x3: Yes   Mood/Affect: Normal       Dilation    Both eyes: 1.0% Mydriacyl, 2.5% Phenylephrine @ 10:49 AM        Slit Lamp and Fundus Exam    External Exam      Right Left   External Normal Normal       Slit Lamp Exam      Right Left   Lids/Lashes Normal Normal   Conjunctiva/Sclera White and quiet White and quiet   Cornea Clear  Clear   Anterior Chamber Deep and quiet Deep and quiet   Iris Round and reactive Round and reactive   Lens Posterior chamber intraocular lens Posterior chamber intraocular lens   Anterior Vitreous Normal Normal       Fundus Exam      Right Left   Posterior Vitreous Posterior vitreous detachment clear avitric   Disc Peripapillary atrophy Peripapillary atrophy   C/D Ratio 0.5 0.8   Macula Normal Normal   Vessels Normal Normal   Periphery Normal Normal          IMAGING AND PROCEDURES  Imaging and Procedures for 04/02/21  OCT, Retina - OU - Both Eyes       Right Eye Quality was good. Scan locations included subfoveal. Central Foveal Thickness: 292. Progression has been stable. Findings include normal foveal contour, myopic contour.   Left Eye Quality was good. Scan locations included subfoveal. Central Foveal Thickness: 322. Progression has been stable. Findings include normal foveal contour, myopic contour.                 ASSESSMENT/PLAN:  Glaucoma Continue under the care of Dr. Dionne Ano for advanced open-angle glaucoma changes left eye and less so right eye  Early stage nonexudative age-related macular degeneration of both eyes Minor no high risk features, does not reach the level of requiring oral vitamin supplementation  Posterior vitreous detachment of right eye Vitreous debris and floaters OD minor in extent, minor vitreomacular adhesion, no foveal distortion observed  ICD-10-CM   1. Senile macular retinal degeneration  H35.30 OCT, Retina - OU - Both Eyes  2. Primary open angle glaucoma of both eyes, unspecified glaucoma stage  H40.1130   3. Early stage nonexudative age-related macular degeneration of both eyes  H35.3131   4. Posterior vitreous detachment of right eye  H43.811     1.  2.  3.  Ophthalmic Meds Ordered this visit:  No orders of the defined types were placed in this encounter.      Return in about 1 year (around 04/02/2022)  for DILATE OU, OCT.  There are no Patient Instructions on file for this visit.   Explained the diagnoses, plan, and follow up with the patient and they expressed understanding.  Patient expressed understanding of the importance of proper follow up care.   Clent Demark Jensyn Shave M.D. Diseases & Surgery of the Retina and Vitreous [facility] [place] 04/02/21     Abbreviations: M myopia (nearsighted); A astigmatism; H hyperopia (farsighted); P presbyopia; Mrx spectacle prescription;  CTL contact lenses; OD right eye; OS left eye; OU both eyes  XT exotropia; ET esotropia; PEK punctate epithelial keratitis; PEE punctate epithelial erosions; DES dry eye syndrome; MGD meibomian gland dysfunction; ATs artificial tears; PFAT's preservative free artificial tears; [place] nuclear sclerotic cataract; PSC posterior subcapsular cataract; ERM epi-retinal membrane; PVD posterior vitreous detachment; RD retinal detachment; DM diabetes mellitus; DR diabetic retinopathy; NPDR non-proliferative diabetic retinopathy; PDR proliferative diabetic retinopathy; CSME clinically significant macular edema; DME diabetic macular edema; dbh dot blot hemorrhages; CWS cotton wool spot; POAG primary open angle glaucoma; C/D cup-to-disc ratio; HVF humphrey visual field; GVF goldmann visual field; OCT optical coherence tomography; IOP intraocular pressure; BRVO Branch retinal vein occlusion; CRVO central retinal vein occlusion; CRAO central retinal artery occlusion; BRAO branch retinal artery occlusion; RT retinal tear; SB scleral buckle; PPV pars plana vitrectomy; VH Vitreous hemorrhage; PRP panretinal laser photocoagulation; IVK intravitreal kenalog; VMT vitreomacular traction; MH Macular hole;  NVD neovascularization of the disc; NVE neovascularization elsewhere; AREDS age related eye disease study; ARMD age related macular degeneration; POAG primary open angle glaucoma; EBMD epithelial/anterior basement membrane dystrophy; ACIOL  anterior chamber intraocular lens; IOL intraocular lens; PCIOL posterior chamber intraocular lens; Phaco/IOL phacoemulsification with intraocular lens placement; [place] photorefractive keratectomy; LASIK laser assisted in situ keratomileusis; HTN hypertension; DM diabetes mellitus; COPD chronic obstructive pulmonary disease

## 2021-04-02 NOTE — Assessment & Plan Note (Signed)
Vitreous debris and floaters OD minor in extent, minor vitreomacular adhesion, no foveal distortion observed

## 2021-04-03 DIAGNOSIS — E291 Testicular hypofunction: Secondary | ICD-10-CM | POA: Diagnosis not present

## 2021-04-03 DIAGNOSIS — E785 Hyperlipidemia, unspecified: Secondary | ICD-10-CM | POA: Diagnosis not present

## 2021-04-03 DIAGNOSIS — R7301 Impaired fasting glucose: Secondary | ICD-10-CM | POA: Diagnosis not present

## 2021-04-13 DIAGNOSIS — E291 Testicular hypofunction: Secondary | ICD-10-CM | POA: Diagnosis not present

## 2021-04-29 DIAGNOSIS — R82998 Other abnormal findings in urine: Secondary | ICD-10-CM | POA: Diagnosis not present

## 2021-04-30 ENCOUNTER — Ambulatory Visit: Payer: PPO | Admitting: Neurology

## 2021-04-30 DIAGNOSIS — R7301 Impaired fasting glucose: Secondary | ICD-10-CM | POA: Diagnosis not present

## 2021-04-30 DIAGNOSIS — M25461 Effusion, right knee: Secondary | ICD-10-CM | POA: Diagnosis not present

## 2021-04-30 DIAGNOSIS — F419 Anxiety disorder, unspecified: Secondary | ICD-10-CM | POA: Diagnosis not present

## 2021-04-30 DIAGNOSIS — N1832 Chronic kidney disease, stage 3b: Secondary | ICD-10-CM | POA: Diagnosis not present

## 2021-04-30 DIAGNOSIS — E669 Obesity, unspecified: Secondary | ICD-10-CM | POA: Diagnosis not present

## 2021-04-30 DIAGNOSIS — E785 Hyperlipidemia, unspecified: Secondary | ICD-10-CM | POA: Diagnosis not present

## 2021-04-30 DIAGNOSIS — I10 Essential (primary) hypertension: Secondary | ICD-10-CM | POA: Diagnosis not present

## 2021-04-30 DIAGNOSIS — M25561 Pain in right knee: Secondary | ICD-10-CM | POA: Diagnosis not present

## 2021-04-30 DIAGNOSIS — Z8551 Personal history of malignant neoplasm of bladder: Secondary | ICD-10-CM | POA: Diagnosis not present

## 2021-04-30 DIAGNOSIS — I2584 Coronary atherosclerosis due to calcified coronary lesion: Secondary | ICD-10-CM | POA: Diagnosis not present

## 2021-04-30 DIAGNOSIS — G4730 Sleep apnea, unspecified: Secondary | ICD-10-CM | POA: Diagnosis not present

## 2021-04-30 DIAGNOSIS — G3184 Mild cognitive impairment, so stated: Secondary | ICD-10-CM | POA: Diagnosis not present

## 2021-04-30 DIAGNOSIS — Z0000 Encounter for general adult medical examination without abnormal findings: Secondary | ICD-10-CM | POA: Diagnosis not present

## 2021-05-01 DIAGNOSIS — E291 Testicular hypofunction: Secondary | ICD-10-CM | POA: Diagnosis not present

## 2021-05-11 ENCOUNTER — Other Ambulatory Visit: Payer: Self-pay | Admitting: Internal Medicine

## 2021-05-11 DIAGNOSIS — N1832 Chronic kidney disease, stage 3b: Secondary | ICD-10-CM

## 2021-05-11 DIAGNOSIS — C679 Malignant neoplasm of bladder, unspecified: Secondary | ICD-10-CM

## 2021-05-12 ENCOUNTER — Ambulatory Visit
Admission: RE | Admit: 2021-05-12 | Discharge: 2021-05-12 | Disposition: A | Discharge status: Home / Self Care | Payer: PPO | Source: Ambulatory Visit | Attending: Internal Medicine | Admitting: Internal Medicine

## 2021-05-12 DIAGNOSIS — C679 Malignant neoplasm of bladder, unspecified: Secondary | ICD-10-CM

## 2021-05-12 DIAGNOSIS — N189 Chronic kidney disease, unspecified: Secondary | ICD-10-CM | POA: Diagnosis not present

## 2021-05-12 DIAGNOSIS — N1832 Chronic kidney disease, stage 3b: Secondary | ICD-10-CM

## 2021-05-15 ENCOUNTER — Ambulatory Visit: Payer: PPO | Admitting: Podiatry

## 2021-05-15 ENCOUNTER — Other Ambulatory Visit: Payer: Self-pay

## 2021-05-15 ENCOUNTER — Ambulatory Visit (INDEPENDENT_AMBULATORY_CARE_PROVIDER_SITE_OTHER): Payer: PPO

## 2021-05-15 ENCOUNTER — Other Ambulatory Visit: Payer: Self-pay | Admitting: Podiatry

## 2021-05-15 DIAGNOSIS — U071 COVID-19: Secondary | ICD-10-CM | POA: Insufficient documentation

## 2021-05-15 DIAGNOSIS — M722 Plantar fascial fibromatosis: Secondary | ICD-10-CM

## 2021-05-15 DIAGNOSIS — F09 Unspecified mental disorder due to known physiological condition: Secondary | ICD-10-CM | POA: Insufficient documentation

## 2021-05-15 DIAGNOSIS — M79672 Pain in left foot: Secondary | ICD-10-CM

## 2021-05-15 DIAGNOSIS — B351 Tinea unguium: Secondary | ICD-10-CM | POA: Diagnosis not present

## 2021-05-15 DIAGNOSIS — N1832 Chronic kidney disease, stage 3b: Secondary | ICD-10-CM | POA: Insufficient documentation

## 2021-05-15 DIAGNOSIS — M79671 Pain in right foot: Secondary | ICD-10-CM

## 2021-05-15 MED ORDER — BETAMETHASONE SOD PHOS & ACET 6 (3-3) MG/ML IJ SUSP
12.0000 mg | Freq: Once | INTRAMUSCULAR | Status: AC
  Administered 2021-05-15: 12 mg

## 2021-05-15 NOTE — Patient Instructions (Signed)

## 2021-05-15 NOTE — Progress Notes (Signed)
  Subjective:  Patient ID: Brian Cortez, male    DOB: 01-09-1951,  MRN: [id number]  Chief Complaint  Patient presents with   Foot Pain    Pt states dx of plantar fasciitis 7-8 years ago, bilateral heel pain since Monday.   Nail Problem    Pt states nail improved, no concerns.   70 y.o. male presents with the above complaint. History confirmed with patient.   Objective:  Physical Exam: warm, good capillary refill, no trophic changes or ulcerative lesions, normal DP and PT pulses and normal sensory exam. Right 1st nail thickening, yellow discoloration, crumbly texture but with proximal clearing  POP bilateral heels at medial calc tuber  Radiographs Bilat foot: No acute fractures or dislocations. Heel spurs present bilat.  Assessment:   1. Plantar fasciitis   2. Onychomycosis    Plan:  Patient was evaluated and treated and all questions answered.  Onychomycosis  -Improving. Continue fluconazole -Nail right hallux debrided down with rotary burr.  Plantar Fasciitis, bilaterally - XR reviewed as above.  - Educated on icing and stretching. Instructions given.    Procedure: Injection Tendon/Ligament Location: Bilateral plantar fascia at the glabrous junction; medial approach. Skin Prep: Alcohol. Injectate: 1 cc 0.5% marcaine plain, 1 cc dexamethasone phosphate, 0.5 cc kenalog 10. Disposition: Patient tolerated procedure well. Injection site dressed with a band-aid.   No follow-ups on file.

## 2021-05-20 DIAGNOSIS — L821 Other seborrheic keratosis: Secondary | ICD-10-CM | POA: Diagnosis not present

## 2021-05-20 DIAGNOSIS — L570 Actinic keratosis: Secondary | ICD-10-CM | POA: Diagnosis not present

## 2021-05-20 DIAGNOSIS — L812 Freckles: Secondary | ICD-10-CM | POA: Diagnosis not present

## 2021-05-20 DIAGNOSIS — D1801 Hemangioma of skin and subcutaneous tissue: Secondary | ICD-10-CM | POA: Diagnosis not present

## 2021-05-27 ENCOUNTER — Other Ambulatory Visit: Payer: Self-pay

## 2021-05-27 ENCOUNTER — Encounter: Payer: Self-pay | Admitting: Neurology

## 2021-05-27 ENCOUNTER — Ambulatory Visit: Payer: PPO | Admitting: Neurology

## 2021-05-27 VITALS — BP 133/80 | HR 77 | Ht 75.0 in | Wt 269.0 lb

## 2021-05-27 DIAGNOSIS — G3184 Mild cognitive impairment, so stated: Secondary | ICD-10-CM | POA: Diagnosis not present

## 2021-05-27 DIAGNOSIS — G4733 Obstructive sleep apnea (adult) (pediatric): Secondary | ICD-10-CM

## 2021-05-27 DIAGNOSIS — I8312 Varicose veins of left lower extremity with inflammation: Secondary | ICD-10-CM | POA: Diagnosis not present

## 2021-05-27 DIAGNOSIS — Z9989 Dependence on other enabling machines and devices: Secondary | ICD-10-CM

## 2021-05-27 DIAGNOSIS — I8311 Varicose veins of right lower extremity with inflammation: Secondary | ICD-10-CM | POA: Diagnosis not present

## 2021-05-27 DIAGNOSIS — H401192 Primary open-angle glaucoma, unspecified eye, moderate stage: Secondary | ICD-10-CM

## 2021-05-27 MED ORDER — DONEPEZIL HCL 10 MG PO TBDP: 10.0000 mg | ORAL_TABLET | Freq: Every day | ORAL | 3 refills | Status: DC

## 2021-05-27 MED ORDER — DONEPEZIL HCL 5 MG PO TABS: 5.0000 mg | ORAL_TABLET | Freq: Every day | ORAL | 0 refills | Status: DC

## 2021-05-27 NOTE — Progress Notes (Signed)
[facility]   Provider:  Larey Seat, [place] D  Referring Provider: Prince Solian, MD Primary Care Physician:  Prince Solian, MD  Chief Complaint  Patient presents with   Follow-up    Pt alone, rm 11. DME: [facility]  , memory related states things are about the same. He started taking prevagen and has been taking for 60 days and has not noticed improvement.    HPI: Today is 27 May 2021 and I have the pleasure of meeting today with Mr. ROSEVELT LUU , who is a 70 y.o. male and seen today for MCI and CPAP  compliance.  He has developed Planter fasciitis and significant enough knee pain to consider a total knee replacement in the near future.  This will be on the right.  While we have met for many years now following his CPAP compliance which has always been excellent he again unsurprisingly, has again in 100% compliance with an average hour 8-hour 54-minute nightly use of CPAP uses an AutoSet with 5 to 15 cm water pressure and 3 cm EPR, residual AHI 0.9 which is excellent.  No central apneas arousing.  Pressure at the 95th percentile is 11.3 well within the current pressure settings so no changes have to be made and he does not have major air leakage either his Epworth score was endorsed to his geriatric depression score was endorsed at only one-point and we also performed a Moca Montreal cognitive assessment test today.  Today he scored 23 out of 30 points which is a significant decrease to 4 months ago when he was at 27 out of 30 points.  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  He had only 1 out of 5 steps correct.  So he carried through the calculation error from step to.  He had good attention naming and he was able to do a drawing of his three-dimensional cube.  He did put the hands of the clock correctly.  And the numbers.  Contour was intact.  He did for the first  time now have trouble with the Trail Making Test. He reports no trouble with attention- he started Prevagen and had no effect. He will now start  Aricept 5 mg daily and if no side effect will increase to 10 mg a day po.  Normal brain MRI.       01-26-2021: RIVAN SIORDIA is a 70 y.o. male and seen for a new and different problem today, regarding subjective memory loss. He has been sleeping well, breathing well and is  He is very concerned about his short memory. He reports trouble with name finding. He couldn't remember the name of a [facility] he attended last week- friends with Gershon Crane.  We will do a MOCA today- he started off with visio-spatial and executive dysfunction. His CPAP compliance plays a secondary role today- 100 % compliance and residual AHI 1.5/h. will need no adjustment.     seen on 05-21-2020.  Mr. Schiller was in need of a new CPAP machine he was evaluated for his current baseline by home sleep test performed on January 23, 2020.  He has been followed here for CPAP compliance for over 5 years.  Fully vaccinated, no nocturia, treated for bladder cancer.  Surprisingly mild obstructive sleep apnea was this time found at an AHI of 9.4/h but strongly REM dependent.  During  REM sleep his AHI was 20.6 associated with moderate snoring there was also a bradycardia trend and prolonged hypoxemic intervals.  My recommendation was to start a new CPAP machine with auto titration measures as hypoxemia and REM dependence cannot be addressed in a dental device for inspire device.  The patient has been 100% compliant with his new device a minimum pressure of 5 maximum pressure of 15 cmH2O with 3 cm EPR the AHI was 0.8/h which is an excellent resolution of apnea.  This is a 90% reduction of his apnea count in comparison to his new baseline.  He has mild to moderate air leakage his pressure at the 95th percentile is 11.3 cmH2O and well within the current set limits.  I do not need to  make any changes unless the patient has any discomfort with the interface.  Otherwise I encouraged him to continue his compliant use of CPAP. He reports his water reservoir is almost empty after a night of use.  He no longer uses an ozone cleaner - not ever since he has a new machine.  The patient endorsed the Epworth sleepiness score today at 3 points only the fatigue severity score at 17 points, the geriatric depression score at 0 points. He continues to play golf he is watching out to prevent dehydration.     12-19-2019, he follows Dr. Dagmar Hait for PCP.  He follows for CPAP compliance. His machine is now 70 years old and he needs a new one. He has not contracted Covid to his knowledge , he got the first shot last Thursday.  His diagnosis is OSA- allergic rhinitis - obesity- pneumonia- he no longer has nocturia.    Last seen here as a revisit on 09-20-2018, At the pleasure of meeting today with Early [name], and meanwhile retired 70 year old Caucasian married male patient who has been followed here for CPAP needs since 2013 of 14.  He continues to be highly compliant CPAP patient, his average use of time of 7 hours 7 minutes for this 30-day.  And his compliance was 87%, residual AHi at 1.5/h. Uses an S9 Autoset , set at 8 cm , 2 cm EPR.  He was not able to use his CPAP on 5 days due to illness, he has learned of a return of his bladder cancer by cystoscopy- Biopsy Dr. Lawerance Bach - and needed additional work-up and initiation of chemotherapy.  09-14-2017,Mr. Curtner is seen here today for a yearly revisit. He has been very compliant CPAP user with 5% compliance averaging 8 hours and 23 minutes at night set pressure is 8 cm water with 27 cm EPR, residual AHI is 0.8 he does not have major air leaks in the low residual AHI is equally distributed between central and obstructive events. He has lost weight visibly. He endorsed the Epworth sleepiness score at 2 points the fatigue severity score at 11  points the geriatric depression score at 1 out of 15 points. He is retired, he travels and enjoys golf. He is taking Administrator.    Fatigue and lightheadedness were improved, until last month. He has no longer nocturia since being on CPAP. In 2016 he went to the ED and was diagnosed with diastolic heart failure, had a nuclear test with Dr. Loralie Champagne - improved on Coreg.  His medical history has changed drastically over the last 12 month, he suffered a congestive heart failure episode and has been controlled by Coreg. His bladder cancer was discovered in Dec 2014. Underwent  BCG treatment. Has a 30% re-occurrence risk. He had pneumonia and changed PCPs. The data collection began on 07-28-14 and ended on 08-26-14 CPAP is used at 8 cm water pressure with 2 cm EPR the residual AHI is 1.6 which is excellent,  the patient uses a machine on average 8 hours and 24 minutes at night and has 100% compliance for nightly use  over 4 hours. Median daily usage 8 hours 11 minutes.  Interval history history from 09-09-15, the patient's 100% compliant for the number of 30 days and each of these days over 4 hours of consecutive use has been registered. On average he uses his machine for 8 hours and 47 minutes set pressure is 8 cm water with 2 cm expiratory pressure relief ( EPR) the residual AHI is 1.4 he has minimal air leaks, his Epworth sleepiness score today is 1. fatigue severity 6 points and the geriatric depression score is endorsed at one point.  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 When I saw him last year he had been suffering from pneumonia and his primary care physician, Dr. Elsworth Soho, had treated him. The patient is a bladder cancer survivor and has been diagnosed with diastolic heart failure. His cardiac function had improved on Coreg. I have followed him for obstructive sleep apnea and he is a CPAP compliant patient with 100% of use  of his CPAP at average use of 8 hours and 54 minutes nightly. CPAP is set at 8 cm water pressure this 2 cm EPR and a residual AHI of 1.4. Minor air leaks are noted, these have no clinical bearing.    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  Sleeping better, SOB today, coughing. has ciliary injection, hay fever.  Severe  allergic rhinitis. Obese. Has not had recurrence of Nocturia. Hot flushes after d/c testosterone supplements.  How likely are you to doze in the following situations: 0 = not likely, 1 = slight chance, 2 = moderate chance, 3 = high chance  Sitting and Reading? 1 Watching Television?1 Sitting inactive in a public place (theater or meeting)? Lying down in the afternoon when circumstances permit?1 Sitting and talking to someone? Sitting quietly after lunch without alcohol?  In a car, while stopped for a few minutes in traffic? As a passenger in a car for an hour without a break?  Total =from previously 10 /24 now to 3/ 24 points.   FSS 15/ 63     He is finished  6 chemotherapy rounds in 2020.  Bladder cancer had returned, this has affected his fatigue which was endorsed at 35 out of 63 possible points, but he has not had excessive daytime sleepiness and endorsed only 2 out of 24 points.   He also is not clinically depressed geriatric depression score endorsed at 2 out of 15 points.  He is worried about memory - has survived cancer, has glaucoma and had cataract surgery. today is 01-26-2021.     Social History   Socioeconomic History   Marital status: Married    Spouse name: Pamala Hurry   Number of children: 2   Years of education: Post Grad   Highest education level: Not on file  Occupational History  Occupation: RETIRED BANK -PRESIDENT     Employer: [employer]   Occupation: PRESIDENT    Employer: [employer]  Tobacco Use   Smoking status: Never Smoker   Smokeless tobacco: Never Used  Scientific laboratory technician Use: Never used  Substance and Sexual Activity   Alcohol use: Yes    Alcohol/week: 0.0 standard drinks    Comment: occasional   Drug use: No   Sexual activity: Not on file    Comment: VASECTOMY  Other Topics Concern   Not on file  Social History Narrative   Patient lives at home with spouse.    Caffeine Use: 3-4 cups   Social Determinants of Health   Financial Resource Strain: Not on file  Food Insecurity: Not on file  Transportation Needs: Not on file  Physical Activity: Not on file  Stress: Not on file  Social Connections: Not on file  Intimate Partner Violence: Not on file    Family History  Problem Relation Age of Onset   Heart attack Father 68   Stroke Father 40   Heart attack Paternal Grandfather        in 33s   Prostate cancer Paternal Uncle    Heart attack Paternal Uncle         > 26   Heart attack Maternal Uncle 80       > 55   Colon cancer Neg Hx    Diabetes Neg Hx     Past Medical History:  Diagnosis Date   Anxiety    Bladder tumor    Carpal tunnel syndrome, left    intermittant   Chronic seasonal allergic rhinitis    Coronary artery disease    per Dr Aundra Dubin (cardiologist) note --- noted on CT chest coronary calcification -- ETT-Cardiolite normal in 2014   DDD (degenerative disc disease), cervical    Dilated cardiomyopathy ([place])    Diverticulosis of colon    Fecal incontinence    GERD (gastroesophageal reflux disease)    Grade II diastolic dysfunction 83/15/1761   per ECHO    Hemorrhoids    Hiatal hernia    History of adenomatous polyp of colon    2001;  2007;  2012   History of bladder cancer urologist-  dr Gaynelle Arabian   11-06-2013  s/p TURBT and BCG tx's   History of kidney stones    about 45 years ago   History of panic attacks    History of urethral stricture     Hyperlipidemia    Hypertension    Hypogonadism in male    LVH (left ventricular hypertrophy) 06/17/2017   Mild, noted on ECHO   Nonischemic cardiomyopathy [facility])    cardiologist-  dr Aundra Dubin-- per last echo 01/ 2016 ef 50-55%  (up from 09/ 2014 ef 45-50%)   OSA on CPAP    since 2014   Pneumonia 11/2010   Left lower lobe   Pulmonary hypertension ([place]) 06/17/2017   Mild, per ECHO    Past Surgical History:  Procedure Laterality Date   CARDIOVASCULAR STRESS TEST  08-21-2013  dr Aundra Dubin   normal nuclear study w/ no ischemia/  normal LV function and wall motion , ef 63%   CATARACT EXTRACTION W/ INTRAOCULAR LENS  IMPLANT, BILATERAL  2013   COLONOSCOPY  last one 03-03-2016   CYSTO/  DIRECT VISION INTERNAL URETEROTOMY  11/25/2006   KNEE ARTHROSCOPY  2022   LAPAROSCOPIC CHOLECYSTECTOMY  10/15/2006   ORBITAL FRACTURE SURGERY  1986   left eye:  Jan 1986--- fixation of fractures, repair of optic nerve decompression , and complex closure   TONSILLECTOMY  child   TRANSTHORACIC ECHOCARDIOGRAM  12-16-2014   dr Aundra Dubin   grade 1 diastolic dysfunction, ef 40-08% (improved from last study in 2014 , previous ef 45-50%) /  trivial MR and TR/  mild LAE/  inferior vena cava dilated, respirophasic diameter changes were blunted (<50%), consistant w/ elevated central venous pressure   TRANSURETHRAL RESECTION OF BLADDER TUMOR Bilateral 05/16/2018   Procedure: TRANSURETHRAL RESECTION OF BLADDER TUMOR (TURBT)/ RETROGRADE;  Surgeon: Festus Aloe, MD;  Location: [facility];  Service: Urology;  Laterality: Bilateral;   TRANSURETHRAL RESECTION OF BLADDER TUMOR WITH GYRUS (TURBT-GYRUS) N/A 11/06/2013   Procedure: TRANSURETHRAL RESECTION OF BLADDER TUMOR WITH GYRUS (TURBT-GYRUS);  Surgeon: Ailene Rud, MD;  Location: [facility];  Service: Urology;  Laterality: N/A;   TRANSURETHRAL RESECTION OF BLADDER TUMOR WITH MITOMYCIN-C N/A 12/09/2016   Procedure: CYSTOSCOPY TRANSURETHRAL RESECTION OF BLADDER  TUMOR WITH MITOMYCIN-C;  Surgeon: Carolan Clines, MD;  Location: [facility];  Service: Urology;  Laterality: N/A;   VARICOSE VEIN SURGERY  2002   VASECTOMY      Current Outpatient Medications  Medication Sig Dispense Refill   allopurinol (ZYLOPRIM) 300 MG tablet Take 300 mg by mouth daily.     anastrozole (ARIMIDEX) 1 MG tablet Take 2 mg by mouth daily.     atorvastatin (LIPITOR) 40 MG tablet Take 40 mg by mouth every evening.      bimatoprost (LUMIGAN) 0.01 % SOLN INSTILL ONE DROP IN EACH EYE AT BEDTIME     escitalopram (LEXAPRO) 20 MG tablet Take 20 mg by mouth every morning.      fenofibrate 160 MG tablet Take 1 tablet (160 mg total) by mouth daily. 90 tablet 3   fluticasone (FLONASE) 50 MCG/ACT nasal spray FLUTICASONE PROPIONATE 50 MCG/ACT SUSP---  bid prn     Multiple Vitamins-Minerals (PRESERVISION AREDS 2 PO) Take 1 capsule by mouth 2 (two) times daily.      Omega-3 Fatty Acids (FISH OIL) 1000 MG CAPS Take 2 capsules (2,000 mg total) by mouth 2 (two) times daily.  0   pantoprazole (PROTONIX) 40 MG tablet TAKE ONE TABLET DAILY 90 tablet 3   testosterone cypionate (DEPOTESTOSTERONE CYPIONATE) 200 MG/ML injection Inject into the muscle.     timolol (TIMOPTIC) 0.5 % ophthalmic solution Place 1 drop into the right eye daily.     oxyCODONE (OXY IR/ROXICODONE) 5 MG immediate release tablet 1 capsule as needed (Patient not taking: Reported on 05/27/2021)     No current facility-administered medications for this visit.    Allergies as of 05/27/2021 - Review Complete 05/27/2021  Allergen Reaction Noted   Pollen extract Cough 01/13/2013   Sulfamethoxazole Nausea And Vomiting 04/30/2021   Sulfamethoxazole-trimethoprim Rash 11/20/2018    Vitals: BP 133/80   Pulse 77   Ht 6' 3"  (1.905 m)   Wt 269 lb (122 kg)   BMI 33.62 kg/m  Last Weight:  Wt Readings from Last 1 Encounters:  05/27/21 269 lb (122 kg)   Last Height:   Ht Readings from Last 1 Encounters:  05/27/21  6' 3"  (1.905 m)     General: The patient is awake, alert and appears  in acute distress from hay fever and sinusitis. Nasal congestion is noted.  The patient is well groomed. He has visible pressure marks.  Head: Normocephalic, atraumatic. Neck is supple. Circumference at 19- inches   Circumference, from 21" .  Mallampati 3 plus - large neck, small opening   Sinus congestion.  Retrognathia is noted .  Cardiovascular: Regular rate and rhythm, without murmurs or carotid bruit, and without distended neck veins. Respiratory: Lungs are clear to auscultation. He has faster RR 23- min.  Skin:   Ankle edema, affecting the ankle sensation for vibration.  Varicose veins visible in both lower extremities.  Trunk: BMI reduced to 33.87,  he has an erect posture.  Neurologic exam : The patient is awake and alert, oriented to place and time.   Memory subjective  described as intact.  Cranial nerves: no loss of smell or taste- Pupils are equally round and reactive to light.  Status post cataract.  Fully vaccinated.  Nystagmus with gaze to the left , unrestricted visual field.  Facial motor strength is symmetric and his tongue and uvula move midline. Hearing intact.  DTR intact, symmetrical. Babinski downgoing.   Assessment:  After physical and neurologic examination, review of laboratory studies, imaging, neurophysiology testing and pre-existing records, assessment will be reviewed on the problem list:  Memory deficit.- MOCA at 27 out of 30.   Further OSA, obesity, cardiomyopathy, bladder cancer, repeated infections- Sinusitis, bronchitis, Pneumonia.   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  reporting amnestic spells - "pullling a blank" , sometimes there is a delay. Naming people. Not getting lost, not having trouble to file taxes,  but calculation was off this time- financial affairs. He has trouble on the golf course.  I think he has early  dementia- even with normal MRI, normal CMET. I will send him for neurocognitive testing. I started aricept 5 mg a day, will increase in 30 days to 10 a day    2) OSA - 100% compliance for the last 5 years, on CPAP well controlled.  Now on new machine same reciprocal results.  He changed to [facility], now bought up by [facility].   Dr Radford Pax is following orthostatic hypotension, HTN and cardiomyopathy.  Had glaucoma.    Larey Seat, MD      Cc:  Dr Dagmar Hait.

## 2021-05-27 NOTE — Patient Instructions (Signed)
Donepezil tablets What is this medication? DONEPEZIL (doe NEP e zil) is used to treat mild to moderate dementia caused byAlzheimer's disease. This medicine may be used for other purposes; ask your health care provider orpharmacist if you have questions. COMMON BRAND NAME(S): Aricept What should I tell my care team before I take this medication? They need to know if you have any of these conditions: asthma or other lung disease difficulty passing urine head injury heart disease history of irregular heartbeat liver disease seizures (convulsions) stomach or intestinal disease, ulcers or stomach bleeding an unusual or allergic reaction to donepezil, other medicines, foods, dyes, or preservatives pregnant or trying to get pregnant breast-feeding How should I use this medication? Take this medicine by mouth with a glass of water. Follow the directions on the prescription label. You may take this medicine with or without food. Take this medicine at regular intervals. This medicine is usually taken before bedtime. Do not take it more often than directed. Continue to take your medicine even ifyou feel better. Do not stop taking except on your doctor's advice. If you are taking the 23 mg donepezil tablet, swallow it whole; do not cut,crush, or chew it. Talk to your pediatrician regarding the use of this medicine in children.Special care may be needed. Overdosage: If you think you have taken too much of this medicine contact apoison control center or emergency room at once. NOTE: This medicine is only for you. Do not share this medicine with others. What if I miss a dose? If you miss a dose, take it as soon as you can. If it is almost time for yournext dose, take only that dose, do not take double or extra doses. What may interact with this medication? Do not take this medicine with any of the following medications: certain medicines for fungal infections like itraconazole, fluconazole,  posaconazole, and voriconazole cisapride dextromethorphan; quinidine dronedarone pimozide quinidine thioridazine This medicine may also interact with the following medications: antihistamines for allergy, cough and cold atropine bethanechol carbamazepine certain medicines for bladder problems like oxybutynin, tolterodine certain medicines for Parkinson's disease like benztropine, trihexyphenidyl certain medicines for stomach problems like dicyclomine, hyoscyamine certain medicines for travel sickness like scopolamine dexamethasone dofetilide ipratropium NSAIDs, medicines for pain and inflammation, like ibuprofen or naproxen other medicines for Alzheimer's disease other medicines that prolong the QT interval (cause an abnormal heart rhythm) phenobarbital phenytoin rifampin, rifabutin or rifapentine ziprasidone This list may not describe all possible interactions. Give your health care provider a list of all the medicines, herbs, non-prescription drugs, or dietary supplements you use. Also tell them if you smoke, drink alcohol, or use illegaldrugs. Some items may interact with your medicine. What should I watch for while using this medication? Visit your doctor or health care professional for regular checks on your progress. Check with your doctor or health care professional if your symptomsdo not get better or if they get worse. You may get drowsy or dizzy. Do not drive, use machinery, or do anything thatneeds mental alertness until you know how this drug affects you. What side effects may I notice from receiving this medication? Side effects that you should report to your doctor or health care professionalas soon as possible: allergic reactions like skin rash, itching or hives, swelling of the face, lips, or tongue feeling faint or lightheaded, falls loss of bladder control seizures signs and symptoms of a dangerous change in heartbeat or heart rhythm like chest pain; dizziness;  fast or irregular heartbeat; palpitations; feeling faint  or lightheaded, falls; breathing problems signs and symptoms of infection like fever or chills; cough; sore throat; pain or trouble passing urine signs and symptoms of liver injury like dark yellow or brown urine; general ill feeling or flu-like symptoms; light-colored stools; loss of appetite; nausea; right upper belly pain; unusually weak or tired; yellowing of the eyes or skin slow heartbeat or palpitations unusual bleeding or bruising vomiting Side effects that usually do not require medical attention (report to yourdoctor or health care professional if they continue or are bothersome): diarrhea, especially when starting treatment headache loss of appetite muscle cramps nausea stomach upset This list may not describe all possible side effects. Call your doctor for medical advice about side effects. You may report side effects to FDA at1-800-FDA-1088. Where should I keep my medication? Keep out of reach of children. Store at room temperature between 15 and 30 degrees C (59 and 86 degrees F).Throw away any unused medicine after the expiration date. NOTE: This sheet is a summary. It may not cover all possible information. If you have questions about this medicine, talk to your doctor, pharmacist, orhealth care provider.  2022 Elsevier/Gold Standard (2018-10-30 10:33:41)

## 2021-05-28 ENCOUNTER — Encounter: Payer: Self-pay | Admitting: Psychology

## 2021-06-04 DIAGNOSIS — C672 Malignant neoplasm of lateral wall of bladder: Secondary | ICD-10-CM | POA: Diagnosis not present

## 2021-06-04 DIAGNOSIS — N521 Erectile dysfunction due to diseases classified elsewhere: Secondary | ICD-10-CM | POA: Diagnosis not present

## 2021-06-09 ENCOUNTER — Ambulatory Visit: Payer: PPO | Admitting: Cardiology

## 2021-06-09 ENCOUNTER — Other Ambulatory Visit: Payer: Self-pay

## 2021-06-09 ENCOUNTER — Encounter: Payer: Self-pay | Admitting: Cardiology

## 2021-06-09 VITALS — BP 152/92 | HR 65 | Ht 75.0 in | Wt 278.0 lb

## 2021-06-09 DIAGNOSIS — E782 Mixed hyperlipidemia: Secondary | ICD-10-CM | POA: Diagnosis not present

## 2021-06-09 DIAGNOSIS — I10 Essential (primary) hypertension: Secondary | ICD-10-CM | POA: Diagnosis not present

## 2021-06-09 DIAGNOSIS — I420 Dilated cardiomyopathy: Secondary | ICD-10-CM

## 2021-06-09 DIAGNOSIS — I2510 Atherosclerotic heart disease of native coronary artery without angina pectoris: Secondary | ICD-10-CM | POA: Diagnosis not present

## 2021-06-09 MED ORDER — ICOSAPENT ETHYL 1 G PO CAPS: 2.0000 g | ORAL_CAPSULE | Freq: Two times a day (BID) | ORAL | 3 refills | Status: DC

## 2021-06-09 MED ORDER — ATORVASTATIN CALCIUM 40 MG PO TABS: 40.0000 mg | ORAL_TABLET | Freq: Every evening | ORAL | 3 refills | Status: DC

## 2021-06-09 MED ORDER — FENOFIBRATE 160 MG PO TABS: 160.0000 mg | ORAL_TABLET | Freq: Every day | ORAL | 3 refills | Status: DC

## 2021-06-09 NOTE — Addendum Note (Signed)
Addended by: Antonieta Iba on: 06/09/2021 09:37 AM   Modules accepted: Orders

## 2021-06-09 NOTE — Patient Instructions (Signed)
Medication Instructions:  Your physician has recommended you make the following change in your medication: 1) START taking Vascepa 2g twice daily  *If you need a refill on your cardiac medications before your next appointment, please call your pharmacy*   Lab Work: Fasting lipids and ALT in 4 months If you have labs (blood work) drawn today and your tests are completely normal, you will receive your results only by: [place] (if you have MyChart) OR A paper copy in the mail If you have any lab test that is abnormal or we need to change your treatment, we will call you to review the results.  Follow-Up: At [facility], you and your health needs are our priority.  As part of our continuing mission to provide you with exceptional heart care, we have created designated Provider Care Teams.  These Care Teams include your primary Cardiologist (physician) and Advanced Practice Providers (APPs -  Physician Assistants and Nurse Practitioners) who all work together to provide you with the care you need, when you need it.  Your next appointment:   1 year(s)  The format for your next appointment:   In Person  Provider:   You may see Fransico Him, MD or one of the following Advanced Practice Providers on your designated Care Team:   Melina Copa, PA-C Ermalinda Barrios, PA-C

## 2021-06-09 NOTE — Progress Notes (Signed)
06/09/2021 Early [name]   1951-01-23  [id number]  Primary Physician Prince Solian, MD Primary Cardiologist: Dr. Radford Pax Electrophysiologist: None   Reason for Visit/CC: f/u for CAD  HPI:  Brian Cortez is a 70 y.o. male with a hx of hyperlipidemia, HTN, DCM with prior EF 45-50% with mild global hypokinesis, coronary artery calcification on a chest CT with normal nuclear stress test and EF was 63% on the Cardiolite (contrasting with the echo).  He also has a history of bladder cancer and had resection and BCG treatment.  He is here today for followup and is doing well.  He denies any chest pain or pressure, SOB, DOE, PND, orthopnea, LE edema,  palpitations or syncope. He has had some problems with dizziness when he stands up too fast.  He has known varicose veins and is getting it treated and has compression hose to use. He is compliant with his meds and is tolerating meds with no SE.  He works out 5 times a week.  Current Meds  Medication Sig   allopurinol (ZYLOPRIM) 300 MG tablet Take 300 mg by mouth daily.   anastrozole (ARIMIDEX) 1 MG tablet Take 2 mg by mouth daily.   atorvastatin (LIPITOR) 40 MG tablet Take 40 mg by mouth every evening.    bimatoprost (LUMIGAN) 0.01 % SOLN INSTILL ONE DROP IN [facility] EYE AT BEDTIME   donepezil (ARICEPT ODT) 10 MG disintegrating tablet Take 1 tablet (10 mg total) by mouth at bedtime.   donepezil (ARICEPT) 5 MG tablet Take 1 tablet (5 mg total) by mouth at bedtime.   escitalopram (LEXAPRO) 20 MG tablet Take 20 mg by mouth every morning.    fenofibrate 160 MG tablet Take 1 tablet (160 mg total) by mouth daily.   fexofenadine (ALLEGRA) 180 MG tablet Take 1 tablet by mouth as needed.   Multiple Vitamins-Minerals (PRESERVISION AREDS 2 PO) Take 1 capsule by mouth 2 (two) times daily.    Omega-3 Fatty Acids (FISH OIL) 1000 MG CAPS Take 2 capsules (2,000 mg total) by mouth 2 (two) times daily.   pantoprazole (PROTONIX) 40 MG tablet TAKE ONE TABLET  DAILY   testosterone cypionate (DEPOTESTOSTERONE CYPIONATE) 200 MG/ML injection Inject into the muscle.   timolol (TIMOPTIC) 0.5 % ophthalmic solution Place 1 drop into the right eye daily.   Allergies  Allergen Reactions   Pollen Extract Cough    "sinus issues"   Sulfamethoxazole Nausea And Vomiting   Sulfamethoxazole-Trimethoprim Rash   Past Medical History:  Diagnosis Date   Anxiety    Bladder tumor    Carpal tunnel syndrome, left    intermittant   Chronic seasonal allergic rhinitis    Coronary artery disease    per Dr Aundra Dubin (cardiologist) note --- noted on CT chest coronary calcification -- ETT-Cardiolite normal in 2014   DDD (degenerative disc disease), cervical    Dilated cardiomyopathy ([place])    Diverticulosis of colon    Fecal incontinence    GERD (gastroesophageal reflux disease)    Grade II diastolic dysfunction 92/33/0076   per ECHO    Hemorrhoids    Hiatal hernia    History of adenomatous polyp of colon    2001;  2007;  2012   History of bladder cancer urologist-  dr Gaynelle Arabian   11-06-2013  s/p TURBT and BCG tx's   History of kidney stones    about 45 years ago   History of panic attacks    History of urethral stricture    Hyperlipidemia  Hypertension    Hypogonadism in male    LVH (left ventricular hypertrophy) 06/17/2017   Mild, noted on ECHO   Nonischemic cardiomyopathy [facility])    cardiologist-  dr Aundra Dubin-- per last echo 01/ 2016 ef 50-55%  (up from 09/ 2014 ef 45-50%)   OSA on CPAP    since 2014   Pneumonia 11/2010   Left lower lobe   Pulmonary hypertension ([place]) 06/17/2017   Mild, per ECHO   Family History  Problem Relation Age of Onset   Heart attack Father 52   Stroke Father 62   Heart attack Paternal Grandfather        in 16s   Prostate cancer Paternal Uncle    Heart attack Paternal Uncle         > 85   Heart attack Maternal Uncle 80       > 55   Colon cancer Neg Hx    Diabetes Neg Hx    Past Surgical History:  Procedure  Laterality Date   CARDIOVASCULAR STRESS TEST  08-21-2013  dr Aundra Dubin   normal nuclear study w/ no ischemia/  normal LV function and wall motion , ef 63%   CATARACT EXTRACTION W/ INTRAOCULAR LENS  IMPLANT, BILATERAL  2013   COLONOSCOPY  last one 03-03-2016   CYSTO/  DIRECT VISION INTERNAL URETEROTOMY  11/25/2006   KNEE ARTHROSCOPY  2022   LAPAROSCOPIC CHOLECYSTECTOMY  10/15/2006   ORBITAL FRACTURE SURGERY  1986   left eye:  Jan 1986--- fixation of fractures, repair of optic nerve decompression , and complex closure   TONSILLECTOMY  child   TRANSTHORACIC ECHOCARDIOGRAM  12-16-2014   dr Aundra Dubin   grade 1 diastolic dysfunction, ef 95-63% (improved from last study in 2014 , previous ef 45-50%) /  trivial MR and TR/  mild LAE/  inferior vena cava dilated, respirophasic diameter changes were blunted (<50%), consistant w/ elevated central venous pressure   TRANSURETHRAL RESECTION OF BLADDER TUMOR Bilateral 05/16/2018   Procedure: TRANSURETHRAL RESECTION OF BLADDER TUMOR (TURBT)/ RETROGRADE;  Surgeon: Festus Aloe, MD;  Location: [place];  Service: Urology;  Laterality: Bilateral;   TRANSURETHRAL RESECTION OF BLADDER TUMOR WITH GYRUS (TURBT-GYRUS) N/A 11/06/2013   Procedure: TRANSURETHRAL RESECTION OF BLADDER TUMOR WITH GYRUS (TURBT-GYRUS);  Surgeon: Ailene Rud, MD;  Location: [facility];  Service: Urology;  Laterality: N/A;   TRANSURETHRAL RESECTION OF BLADDER TUMOR WITH MITOMYCIN-C N/A 12/09/2016   Procedure: CYSTOSCOPY TRANSURETHRAL RESECTION OF BLADDER TUMOR WITH MITOMYCIN-C;  Surgeon: Carolan Clines, MD;  Location: [place];  Service: Urology;  Laterality: N/A;   VARICOSE VEIN SURGERY  2002   VASECTOMY     Social History   Socioeconomic History   Marital status: Married    Spouse name: Pamala Hurry   Number of children: 2   Years of education: Social worker   Highest education level: Not on file  Occupational History   Occupation: [employer]     Employer: [employer]   Occupation: PRESIDENT    Employer: [employer]  Tobacco Use   Smoking status: Never   Smokeless tobacco: Never  Vaping Use   Vaping Use: Never used  Substance and Sexual Activity   Alcohol use: Yes    Alcohol/week: 0.0 standard drinks    Comment: occasional   Drug use: No   Sexual activity: Not on file    Comment: VASECTOMY  Other Topics Concern   Not on file  Social History Narrative   Patient lives at home with  spouse.    Caffeine Use: 3-4 cups   Social Determinants of Health   Financial Resource Strain: Not on file  Food Insecurity: Not on file  Transportation Needs: Not on file  Physical Activity: Not on file  Stress: Not on file  Social Connections: Not on file  Intimate Partner Violence: Not on file     Lipid Panel     Component Value Date/Time   CHOL 137 09/01/2020 0749   TRIG 212 (H) 09/01/2020 0749   HDL 33 (L) 09/01/2020 0749   CHOLHDL 4.2 09/01/2020 0749   CHOLHDL 4 09/11/2014 0738   VLDL 57.0 (H) 09/11/2014 0738   LDLCALC 69 09/01/2020 0749   LDLDIRECT 67.7 09/11/2014 0738    Review of Systems: General: negative for chills, fever, night sweats or weight changes.  Cardiovascular: negative for chest pain, dyspnea on exertion, edema, orthopnea, palpitations, paroxysmal nocturnal dyspnea or shortness of breath Dermatological: negative for rash Respiratory: negative for cough or wheezing Urologic: negative for hematuria Abdominal: negative for nausea, vomiting, diarrhea, bright red blood per rectum, melena, or hematemesis Neurologic: negative for visual changes, syncope, or dizziness All other systems reviewed and are otherwise negative except as noted above.   Physical Exam:  GEN: Well nourished, well developed in no acute distress HEENT: Normal NECK: No JVD; No carotid bruits LYMPHATICS: No lymphadenopathy CARDIAC:RRR, no murmurs, rubs, gallops RESPIRATORY:  Clear to auscultation without rales, wheezing or rhonchi   ABDOMEN: Soft, non-tender, non-distended MUSCULOSKELETAL:  No edema; No deformity  SKIN: varicose veins in LLE NEUROLOGIC:  Alert and oriented x 3 PSYCHIATRIC:  Normal affect   EKG perfomred today showed NSR with no ST changes and LAFB  ASSESSMENT AND PLAN:   1. Coronary Artery Calcifications on Chest CT:  -had NST that was negative for ischemia.  -he denies any anginal symptoms  -continue ASA and statin   2. Dilated Cardiomyopathy:  -EF improved on most recent studies.  -reviously 45-50%, but normal at 60-65% on last study in 2019.  -no BB due to bradycardia  3. HTN:  -BP borderline controlled on exam today but had been rushing to get here and normally is normal at home -has not required any antihypertensive therapy  4. HLD/Hypertriglyceridemia - LDL goal < 70 given coronary calcifications.  -I have personally reviewed and interpreted outside Labs performed by patient's PCP which showed LDL 69, HDL 33, TAGs 212 in Oct 2021 and ALT 36 in March 2022 -repeat FLP in 6 months to see if TAGs have improved with exercise -Continue prescription drug management with Atorvastatin 40mg  daily, fenofibrate 160mg  daily>>refilled for 1 year     Marcelo Baldy, MHS [facility] 06/09/2021 9:25 AM

## 2021-06-10 DIAGNOSIS — I8311 Varicose veins of right lower extremity with inflammation: Secondary | ICD-10-CM | POA: Diagnosis not present

## 2021-06-10 DIAGNOSIS — I8312 Varicose veins of left lower extremity with inflammation: Secondary | ICD-10-CM | POA: Diagnosis not present

## 2021-06-12 NOTE — Addendum Note (Signed)
Addended byDanielle Dess on: 06/12/2021 07:37 AM   Modules accepted: Orders

## 2021-06-17 DIAGNOSIS — H401132 Primary open-angle glaucoma, bilateral, moderate stage: Secondary | ICD-10-CM | POA: Diagnosis not present

## 2021-06-22 DIAGNOSIS — E291 Testicular hypofunction: Secondary | ICD-10-CM | POA: Diagnosis not present

## 2021-06-24 DIAGNOSIS — I8311 Varicose veins of right lower extremity with inflammation: Secondary | ICD-10-CM | POA: Diagnosis not present

## 2021-06-24 DIAGNOSIS — I8312 Varicose veins of left lower extremity with inflammation: Secondary | ICD-10-CM | POA: Diagnosis not present

## 2021-06-26 ENCOUNTER — Other Ambulatory Visit: Payer: Self-pay

## 2021-06-26 ENCOUNTER — Ambulatory Visit (INDEPENDENT_AMBULATORY_CARE_PROVIDER_SITE_OTHER): Payer: PPO | Admitting: Podiatry

## 2021-06-26 DIAGNOSIS — M722 Plantar fascial fibromatosis: Secondary | ICD-10-CM

## 2021-06-26 DIAGNOSIS — B351 Tinea unguium: Secondary | ICD-10-CM | POA: Diagnosis not present

## 2021-06-26 MED ORDER — FLUCONAZOLE 150 MG PO TABS: 150.0000 mg | ORAL_TABLET | ORAL | 0 refills | Status: DC

## 2021-06-26 NOTE — Progress Notes (Signed)
  Subjective:  Patient ID: Brian Cortez, male    DOB: 12-Apr-1951,  MRN: [id number]  Chief Complaint  Patient presents with   Nail Problem    F/U nail fungus -pt states with discomfort at nail bed." -no redness/swelling tx: fluconazole (completed)    Plantar Fasciitis    F/U BL PF -pt states," shot did not help but went to good feet store got inserts and it helped     70 y.o. male presents with the above complaint. History confirmed with patient.   Objective:  Physical Exam: warm, good capillary refill, no trophic changes or ulcerative lesions, normal DP and PT pulses and normal sensory exam. Right 1st nail thickening, yellow discoloration, crumbly texture but with proximal clearing  No POP bilateral heels    Assessment:   1. Plantar fasciitis   2. Onychomycosis     Plan:  Patient was evaluated and treated and all questions answered.  Onychomycosis  -Refill fluconazole x1 month  Plantar Fasciitis, bilaterally - Resolved with inserts.   No follow-ups on file.

## 2021-06-29 DIAGNOSIS — M25561 Pain in right knee: Secondary | ICD-10-CM | POA: Diagnosis not present

## 2021-07-09 DIAGNOSIS — G4733 Obstructive sleep apnea (adult) (pediatric): Secondary | ICD-10-CM | POA: Diagnosis not present

## 2021-07-10 DIAGNOSIS — E291 Testicular hypofunction: Secondary | ICD-10-CM | POA: Diagnosis not present

## 2021-07-13 DIAGNOSIS — M545 Low back pain, unspecified: Secondary | ICD-10-CM | POA: Diagnosis not present

## 2021-07-15 DIAGNOSIS — I8312 Varicose veins of left lower extremity with inflammation: Secondary | ICD-10-CM | POA: Diagnosis not present

## 2021-07-15 DIAGNOSIS — M1711 Unilateral primary osteoarthritis, right knee: Secondary | ICD-10-CM | POA: Diagnosis not present

## 2021-07-17 DIAGNOSIS — I8311 Varicose veins of right lower extremity with inflammation: Secondary | ICD-10-CM | POA: Diagnosis not present

## 2021-07-20 DIAGNOSIS — I8311 Varicose veins of right lower extremity with inflammation: Secondary | ICD-10-CM | POA: Diagnosis not present

## 2021-07-24 DIAGNOSIS — E291 Testicular hypofunction: Secondary | ICD-10-CM | POA: Diagnosis not present

## 2021-08-03 ENCOUNTER — Ambulatory Visit: Payer: PPO

## 2021-08-07 DIAGNOSIS — E291 Testicular hypofunction: Secondary | ICD-10-CM | POA: Diagnosis not present

## 2021-08-07 DIAGNOSIS — I8312 Varicose veins of left lower extremity with inflammation: Secondary | ICD-10-CM | POA: Diagnosis not present

## 2021-08-09 DIAGNOSIS — G4733 Obstructive sleep apnea (adult) (pediatric): Secondary | ICD-10-CM | POA: Diagnosis not present

## 2021-08-10 ENCOUNTER — Other Ambulatory Visit (HOSPITAL_BASED_OUTPATIENT_CLINIC_OR_DEPARTMENT_OTHER): Payer: Self-pay

## 2021-08-12 ENCOUNTER — Ambulatory Visit: Payer: PPO | Attending: Internal Medicine

## 2021-08-12 ENCOUNTER — Other Ambulatory Visit (HOSPITAL_BASED_OUTPATIENT_CLINIC_OR_DEPARTMENT_OTHER): Payer: Self-pay

## 2021-08-12 DIAGNOSIS — Z23 Encounter for immunization: Secondary | ICD-10-CM

## 2021-08-12 MED ORDER — INFLUENZA VAC A&B SA ADJ QUAD 0.5 ML IM PRSY
PREFILLED_SYRINGE | INTRAMUSCULAR | 0 refills | Status: DC
  Filled 2021-08-12: qty 0.5, 1d supply, fill #0

## 2021-08-12 MED ORDER — PFIZER COVID-19 VAC BIVALENT 30 MCG/0.3ML IM SUSP
INTRAMUSCULAR | 0 refills | Status: DC
  Filled 2021-08-12: qty 0.3, 1d supply, fill #0

## 2021-08-12 NOTE — Progress Notes (Signed)
   Covid-19 Vaccination Clinic  Name:  Brian Cortez    MRN: [id number] DOB: [birth year]  08/12/2021  Mr. Brian Cortez was observed post Covid-19 immunization for 15 minutes without incident. He was provided with Vaccine Information Sheet and instruction to access the V-Safe system.   Mr. Brian Cortez was instructed to call 911 with any severe reactions post vaccine: Difficulty breathing  Swelling of face and throat  A fast heartbeat  A bad rash all over body  Dizziness and weakness

## 2021-08-20 DIAGNOSIS — E291 Testicular hypofunction: Secondary | ICD-10-CM | POA: Diagnosis not present

## 2021-08-24 DIAGNOSIS — I8312 Varicose veins of left lower extremity with inflammation: Secondary | ICD-10-CM | POA: Diagnosis not present

## 2021-08-25 ENCOUNTER — Telehealth: Payer: Self-pay | Admitting: Internal Medicine

## 2021-08-25 NOTE — Telephone Encounter (Signed)
Inbound call from patient. States his fecal incontinence has worsen. Metamucil wafers are no longer working. Also is having to take gas-x pills twice a day to help with excess gas. Best contact number [phone]

## 2021-08-25 NOTE — Telephone Encounter (Signed)
Pt states that he has had issues with fecal incontinence but they seem to be getting worse. He was told to take metamucil and he did but he has started taking the metamucil wafer and it isn't sure if this has made a difference or not. He is also eating quest bars for nourishment and energy and has developed more gas, he is taking gas-x for this. Plans to go to [place] later this month and wants to have this better under control for the trip. Pt scheduled to see Dr. Henrene Pastor 09/08/21 at 3:40pm. Pt aware of appt.

## 2021-08-26 ENCOUNTER — Encounter: Payer: Self-pay | Admitting: Neurology

## 2021-08-31 ENCOUNTER — Ambulatory Visit: Payer: PPO | Admitting: Neurology

## 2021-08-31 ENCOUNTER — Encounter: Payer: Self-pay | Admitting: Neurology

## 2021-08-31 VITALS — BP 148/90 | HR 70 | Ht 75.0 in | Wt 271.5 lb

## 2021-08-31 DIAGNOSIS — G4733 Obstructive sleep apnea (adult) (pediatric): Secondary | ICD-10-CM | POA: Diagnosis not present

## 2021-08-31 DIAGNOSIS — G3184 Mild cognitive impairment, so stated: Secondary | ICD-10-CM

## 2021-08-31 DIAGNOSIS — Z9989 Dependence on other enabling machines and devices: Secondary | ICD-10-CM

## 2021-08-31 MED ORDER — DONEPEZIL HCL 10 MG PO TBDP: 10.0000 mg | ORAL_TABLET | Freq: Every day | ORAL | 3 refills | Status: DC

## 2021-08-31 NOTE — Patient Instructions (Signed)

## 2021-08-31 NOTE — Progress Notes (Signed)
[facility]   Provider:  Larey Seat, [place] D  Referring Provider: Prince Solian, MD Primary Care Physician:  Prince Solian, MD  Chief Complaint  Patient presents with   Follow-up   Obstructive Sleep Apnea    Rm 11, alone. Here for CPAP and memory f/u. Pt reports doing well on CPAP. Pt reports doing well on Aricept, pt has noticed improvement. Pt is doing better with names and locations.     08-31-2021. Brian Cortez is a 70 y.o. male, He is doing well on CPAP. Has felt that memory has been better on Aricept. Rectal incontinence, GI upset. Metamucil helps. Has been happy with memory improvement. See MOCA improved from 22/ 30 to 25 / 30.  Brian Cortez has been 100% compliant CPAP user over the last 30 days with 100% compliance 4 hours as well.  Average user time is 8 hours and 25 minutes.  AutoSet between 5 and 15 cmH2O with 3 cm EPR.  Residual AHI is 1.3.  His 95th percentile pressure is 11.9 and his 95th percentile air leak is 12.4 both are in good shape.  Central apneas have not arisen and the air leak seems to be higher on Sundays or Saturdays, when he had a bit of alcohol intake.      Today is 27 May 2021 and I have the pleasure of meeting today with Brian Cortez , who is a 70 y.o. male and seen today for MCI and CPAP  compliance.  He has developed Planter fasciitis and significant enough knee pain to consider a total knee replacement in the near future.  This will be on the right.  While we have met for many years now following his CPAP compliance which has always been excellent he again unsurprisingly, has again in 100% compliance with an average hour 8-hour 54-minute nightly use of CPAP uses an AutoSet with 5 to 15 cm water pressure and 3 cm EPR, residual AHI 0.9 which is excellent.  No central apneas arousing.  Pressure at the 95th percentile is 11.3 well within the current pressure settings so no changes have to be made  and he does not have major air leakage either his Epworth score was endorsed to his geriatric depression score was endorsed at only one-point and we also performed a [facility] Montreal cognitive assessment test today.  Today he scored 23 out of 30 points which is a significant decrease to 4 months ago when he was at 27 out of 30 points.  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  He had only 1 out of 5 steps correct.  So he carried through the calculation error from step to.  He had good attention naming and he was able to do a drawing of his three-dimensional cube.  He did put the hands of the clock correctly.  And the numbers.  Contour was intact.  He did for the first time now have trouble with the Trail Making Test. He reports no trouble with attention- he started Prevagen and had no effect. He will now start  Aricept 5 mg daily and if no side effect will increase to 10 mg a day po.  Normal brain MRI.       01-26-2021: Brian Cortez is a 70 y.o. male and seen for a new and different problem today, regarding subjective memory loss. He has been sleeping well, breathing  well and is  He is very concerned about his short memory. He reports trouble with name finding. He couldn't remember the name of a [facility] at [facility] he attended last week- friends with Gershon Crane.  We will do a MOCA today- he started off with visio-spatial and executive dysfunction. His CPAP compliance plays a secondary role today- 100 % compliance and residual AHI 1.5/h. will need no adjustment.     seen on 05-21-2020.  Brian Cortez was in need of a new CPAP machine he was evaluated for his current baseline by home sleep test performed on January 23, 2020.  He has been followed here for CPAP compliance for over 5 years.  Fully vaccinated, no nocturia, treated for bladder cancer.  Surprisingly mild obstructive sleep apnea was this time found at an AHI of 9.4/h but  strongly REM dependent.  During REM sleep his AHI was 20.6 associated with moderate snoring there was also a bradycardia trend and prolonged hypoxemic intervals.  My recommendation was to start a new CPAP machine with auto titration measures as hypoxemia and REM dependence cannot be addressed in a dental device for inspire device.  The patient has been 100% compliant with his new device a minimum pressure of 5 maximum pressure of 15 cmH2O with 3 cm EPR the AHI was 0.8/h which is an excellent resolution of apnea.  This is a 90% reduction of his apnea count in comparison to his new baseline.  He has mild to moderate air leakage his pressure at the 95th percentile is 11.3 cmH2O and well within the current set limits.  I do not need to make any changes unless the patient has any discomfort with the interface.  Otherwise I encouraged him to continue his compliant use of CPAP. He reports his water reservoir is almost empty after a night of use.  He no longer uses an ozone cleaner - not ever since he has a new machine.  The patient endorsed the Epworth sleepiness score today at 3 points only the fatigue severity score at 17 points, the geriatric depression score at 0 points. He continues to play golf he is watching out to prevent dehydration.     12-19-2019, he follows Dr. Dagmar Hait for PCP.  He follows for CPAP compliance. His machine is now 70 years old and he needs a new one. He has not contracted Covid to his knowledge , he got the first shot last Thursday.  His diagnosis is OSA- allergic rhinitis - obesity- pneumonia- he no longer has nocturia.    Last seen here as a revisit on 09-20-2018, At the pleasure of meeting today with Brian Cortez, and meanwhile retired 70 year old Caucasian married male patient who has been followed here for CPAP needs since 2013 of 14.  He continues to be highly compliant CPAP patient, his average use of time of 7 hours 7 minutes for this 30-day.  And his compliance was 87%,  residual AHi at 1.5/h. Uses an S9 Autoset , set at 8 cm , 2 cm EPR.  He was not able to use his CPAP on 5 days due to illness, he has learned of a return of his bladder cancer by cystoscopy- Biopsy Dr. Lawerance Bach - and needed additional work-up and initiation of chemotherapy.  09-14-2017,Brian Cortez is seen here today for a yearly revisit. He has been very compliant CPAP user with 5% compliance averaging 8 hours and 23 minutes at night set pressure is 8 cm water with 27 cm EPR,  residual AHI is 0.8 he does not have major air leaks in the low residual AHI is equally distributed between central and obstructive events. He has lost weight visibly. He endorsed the Epworth sleepiness score at 2 points the fatigue severity score at 11 points the geriatric depression score at 1 out of 15 points. He is retired, he travels and enjoys golf. He is taking Administrator.    Fatigue and lightheadedness were improved, until last month. He has no longer nocturia since being on CPAP. In 2016 he went to the ED and was diagnosed with diastolic heart failure, had a nuclear test with Dr. Loralie Champagne - improved on Coreg.  His medical history has changed drastically over the last 12 month, he suffered a congestive heart failure episode and has been controlled by Coreg. His bladder cancer was discovered in Dec 2014. Underwent BCG treatment. Has a 30% re-occurrence risk. He had pneumonia and changed PCPs. The data collection began on 07-28-14 and ended on 08-26-14 CPAP is used at 8 cm water pressure with 2 cm EPR the residual AHI is 1.6 which is excellent,  the patient uses a machine on average 8 hours and 24 minutes at night and has 100% compliance for nightly use  over 4 hours. Median daily usage 8 hours 11 minutes.  Interval history history from 09-09-15, the patient's 100% compliant for the number of 30 days and each of these days over 4 hours of consecutive use has been registered. On average he uses his machine for 8 hours and  47 minutes set pressure is 8 cm water with 2 cm expiratory pressure relief ( EPR) the residual AHI is 1.4 he has minimal air leaks, his Epworth sleepiness score today is 1. fatigue severity 6 points and the geriatric depression score is endorsed at one point.  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 When I saw him last year he had been suffering from pneumonia and his primary care physician, Dr. Elsworth Soho, had treated him. The patient is a bladder cancer survivor and has been diagnosed with diastolic heart failure. His cardiac function had improved on Coreg. I have followed him for obstructive sleep apnea and he is a CPAP compliant patient with 100% of use of his CPAP at average use of 8 hours and 54 minutes nightly. CPAP is set at 8 cm water pressure this 2 cm EPR and a residual AHI of 1.4. Minor air leaks are noted, these have no clinical bearing.    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  Sleeping better, SOB today, coughing. has ciliary injection, hay fever.  Severe  allergic rhinitis. Obese. Has not had recurrence of Nocturia. Hot flushes after d/c testosterone supplements.  How likely are you to doze in the following situations: 0 = not likely, 1 = slight chance, 2 = moderate chance, 3 = high chance  Sitting and Reading? 1 Watching Television?1 Sitting inactive in a public place (theater or meeting)? Lying down in the afternoon when circumstances permit?1 Sitting and talking to someone? Sitting quietly after lunch without alcohol?  In a car, while stopped for a few minutes in traffic? As a passenger in a car for an hour without a break?  Total =from previously 10 /24 now still  to 3/ 24  points.   FSS 15/ 63     He is finished  6 chemotherapy rounds in 2020.  Bladder cancer had returned, this has affected his fatigue which was endorsed at 35 out of 63 possible points, but he has not had excessive daytime sleepiness and endorsed only 2 out of 24 points.   He also is not clinically depressed geriatric depression score endorsed at 2 out of 15 points.  He is worried about memory - has survived cancer, has glaucoma and had cataract surgery. today is 01-26-2021.      Social History   Socioeconomic History   Marital status: Married    Spouse name: Pamala Hurry   Number of children: 2   Years of education: Post Grad   Highest education level: Not on file  Occupational History   Occupation: [place]    Employer: Presenter, broadcasting   Occupation: PRESIDENT    Employer: [employer]  Tobacco Use   Smoking status: Never Smoker   Smokeless tobacco: Never Used  Scientific laboratory technician Use: Never used  Substance and Sexual Activity   Alcohol use: Yes    Alcohol/week: 0.0 standard drinks    Comment: occasional   Drug use: No   Sexual activity: Not on file    Comment: VASECTOMY  Other Topics Concern   Not on file  Social History Narrative   Patient lives at home with spouse.    Caffeine Use: 3-4 cups   Social Determinants of Health   Financial Resource Strain: Not on file  Food Insecurity: Not on file  Transportation Needs: Not on file  Physical Activity: Not on file  Stress: Not on file  Social Connections: Not on file  Intimate Partner Violence: Not on file    Family History  Problem Relation Age of Onset   Heart attack Father 26   Stroke Father 75   Heart attack Paternal Grandfather        in 19s   Prostate cancer Paternal Uncle    Heart attack Paternal Uncle         > 23   Heart attack Maternal Uncle 80       > 55   Colon cancer Neg Hx    Diabetes Neg Hx     Past Medical History:  Diagnosis Date   Anxiety    Bladder tumor    Carpal  tunnel syndrome, left    intermittant   Chronic seasonal allergic rhinitis    Coronary artery disease    per Dr Aundra Dubin (cardiologist) note --- noted on CT chest coronary calcification -- ETT-Cardiolite normal in 2014   DDD (degenerative disc disease), cervical    Dilated cardiomyopathy ([place])    Diverticulosis of colon    Fecal incontinence    GERD (gastroesophageal reflux disease)    Grade II diastolic dysfunction 22/29/7989   per ECHO    Hemorrhoids    Hiatal hernia    History of adenomatous polyp of colon    2001;  2007;  2012   History of bladder cancer urologist-  dr Gaynelle Arabian   11-06-2013  s/p TURBT and BCG tx's   History of kidney stones    about 45 years ago   History of panic attacks    History of urethral stricture    Hyperlipidemia    Hypertension    Hypogonadism in male    LVH (left ventricular hypertrophy) 06/17/2017  Mild, noted on ECHO   Nonischemic cardiomyopathy [facility])    cardiologist-  dr Aundra Dubin-- per last echo 01/ 2016 ef 50-55%  (up from 09/ 2014 ef 45-50%)   OSA on CPAP    since 2014   Pneumonia 11/2010   Left lower lobe   Pulmonary hypertension ([place]) 06/17/2017   Mild, per ECHO    Past Surgical History:  Procedure Laterality Date   CARDIOVASCULAR STRESS TEST  08-21-2013  dr Aundra Dubin   normal nuclear study w/ no ischemia/  normal LV function and wall motion , ef 63%   CATARACT EXTRACTION W/ INTRAOCULAR LENS  IMPLANT, BILATERAL  2013   COLONOSCOPY  last one 03-03-2016   CYSTO/  DIRECT VISION INTERNAL URETEROTOMY  11/25/2006   KNEE ARTHROSCOPY  2022   LAPAROSCOPIC CHOLECYSTECTOMY  10/15/2006   ORBITAL FRACTURE SURGERY  1986   left eye:  Jan 1986--- fixation of fractures, repair of optic nerve decompression , and complex closure   TONSILLECTOMY  child   TRANSTHORACIC ECHOCARDIOGRAM  12-16-2014   dr Aundra Dubin   grade 1 diastolic dysfunction, ef 32-95% (improved from last study in 2014 , previous ef 45-50%) /  trivial MR and TR/  mild LAE/  inferior vena  cava dilated, respirophasic diameter changes were blunted (<50%), consistant w/ elevated central venous pressure   TRANSURETHRAL RESECTION OF BLADDER TUMOR Bilateral 05/16/2018   Procedure: TRANSURETHRAL RESECTION OF BLADDER TUMOR (TURBT)/ RETROGRADE;  Surgeon: Festus Aloe, MD;  Location: [facility];  Service: Urology;  Laterality: Bilateral;   TRANSURETHRAL RESECTION OF BLADDER TUMOR WITH GYRUS (TURBT-GYRUS) N/A 11/06/2013   Procedure: TRANSURETHRAL RESECTION OF BLADDER TUMOR WITH GYRUS (TURBT-GYRUS);  Surgeon: Ailene Rud, MD;  Location: [facility];  Service: Urology;  Laterality: N/A;   TRANSURETHRAL RESECTION OF BLADDER TUMOR WITH MITOMYCIN-C N/A 12/09/2016   Procedure: CYSTOSCOPY TRANSURETHRAL RESECTION OF BLADDER TUMOR WITH MITOMYCIN-C;  Surgeon: Carolan Clines, MD;  Location: [facility];  Service: Urology;  Laterality: N/A;   VARICOSE VEIN SURGERY  2002   VASECTOMY      Current Outpatient Medications  Medication Sig Dispense Refill   allopurinol (ZYLOPRIM) 300 MG tablet Take 300 mg by mouth daily.     anastrozole (ARIMIDEX) 1 MG tablet Take 2 mg by mouth daily.     atorvastatin (LIPITOR) 40 MG tablet Take 1 tablet (40 mg total) by mouth every evening. 90 tablet 3   bimatoprost (LUMIGAN) 0.01 % SOLN INSTILL ONE DROP IN EACH EYE AT BEDTIME     COVID-19 mRNA bivalent vaccine, Pfizer, (PFIZER COVID-19 VAC BIVALENT) injection Inject into the muscle. 0.3 mL 0   donepezil (ARICEPT ODT) 10 MG disintegrating tablet Take 1 tablet (10 mg total) by mouth at bedtime. 90 tablet 3   escitalopram (LEXAPRO) 20 MG tablet Take 20 mg by mouth every morning.      fenofibrate 160 MG tablet Take 1 tablet (160 mg total) by mouth daily. 90 tablet 3   fexofenadine (ALLEGRA) 180 MG tablet Take 1 tablet by mouth as needed.     fluconazole (DIFLUCAN) 150 MG tablet Take 1 tablet (150 mg total) by mouth once a week. 4 tablet 0   icosapent Ethyl (VASCEPA) 1 g capsule Take 2  capsules (2 g total) by mouth 2 (two) times daily. 360 capsule 3   influenza vaccine adjuvanted (FLUAD) 0.5 ML injection Inject into the muscle. 0.5 mL 0   Multiple Vitamins-Minerals (PRESERVISION AREDS 2 PO) Take 1 capsule by mouth 2 (two) times daily.  Omega-3 Fatty Acids (FISH OIL) 1000 MG CAPS Take 2 capsules (2,000 mg total) by mouth 2 (two) times daily.  0   pantoprazole (PROTONIX) 40 MG tablet TAKE ONE TABLET DAILY 90 tablet 3   testosterone cypionate (DEPOTESTOSTERONE CYPIONATE) 200 MG/ML injection Inject into the muscle.     timolol (TIMOPTIC) 0.5 % ophthalmic solution Place 1 drop into the right eye daily.     No current facility-administered medications for this visit.    Allergies as of 08/31/2021 - Review Complete 08/31/2021  Allergen Reaction Noted   Pollen extract Cough 01/13/2013   Sulfamethoxazole Nausea And Vomiting 04/30/2021   Sulfamethoxazole-trimethoprim Rash 11/20/2018    Vitals: BP (!) 148/90   Pulse 70   Ht $R'6\' 3"'AP$  (1.905 m)   Wt 271 lb 8 oz (123.2 kg)   BMI 33.94 kg/m  Last Weight:  Wt Readings from Last 1 Encounters:  08/31/21 271 lb 8 oz (123.2 kg)   Last Height:   Ht Readings from Last 1 Encounters:  08/31/21 $RemoveB'6\' 3"'FBCqXKpw$  (1.905 m)     General: The patient is awake, alert and appears  in acute distress from hay fever and sinusitis. Nasal congestion is noted.  The patient is well groomed. He has visible pressure marks.  Head: Normocephalic, atraumatic. Neck is supple. Circumference at 19- inches   Circumference, from 21" .   Mallampati 3 plus - large neck, small opening   Sinus congestion.  Retrognathia is noted .  Cardiovascular: Regular rate and rhythm, without murmurs or carotid bruit, and without distended neck veins. Respiratory: Lungs are clear to auscultation. He has faster RR 23- min.  Skin:   Ankle edema, affecting the ankle sensation for vibration.  Varicose veins visible in both lower extremities.  Trunk: BMI reduced to 33.87,  he has an  erect posture.  Neurologic exam : The patient is awake and alert, oriented to place and time.   Memory subjective  described as improved  Montreal Cognitive Assessment  08/31/2021 05/27/2021  Visuospatial/ Executive (0/5) 4 3  Naming (0/3) 3 3  Attention: Read list of digits (0/2) 1 2  Attention: Read list of letters (0/1) 1 1  Attention: Serial 7 subtraction starting at 100 (0/3) 2 2  Language: Repeat phrase (0/2) 2 0  Language : Fluency (0/1) 1 1  Abstraction (0/2) 2 2  Delayed Recall (0/5) 3 2  Orientation (0/6) 6 6  Total 25 22    Cranial nerves: no loss of smell or taste- Pupils are equally round and reactive to light.  Status post cataract.  Fully vaccinated.  Nystagmus with gaze to the left , unrestricted visual field.  Facial motor strength is symmetric and his tongue and uvula move midline. Hearing intact.  DTR intact, symmetrical. Babinski downgoing.   Assessment:  30 minutes; After physical and neurologic examination, review of laboratory studies, imaging, neurophysiology testing and pre-existing records, assessment will be reviewed on the problem list:  Memory deficit.- MOCA at 25 out of 30.   Further OSA, obesity, cardiomyopathy, bladder cancer, repeated infections- Sinusitis, bronchitis, Pneumonia.   Plan:   1) VISIOSPATIAL deficit on MOCA , now also trail making test- 22/ 30 MOCA last visit and 25/ 30 today.  He continues reporting amnestic spells - "pullling a blank" , sometimes there is a delay. Naming people. Not getting lost, not having trouble to file taxes,  but calculation was off this time- financial affairs. He has trouble on the golf course.  I think he can develop Brian  dementia- even with normal MRI, normal CMET.   I have sent him for neurocognitive testing. Dr Sima Matas, PhD.   I started aricept 10 mg a day, he has felt his memory is supported better, and he has GI side effects that are manageable.    2) OSA - 100% compliance for the last 5  years, on CPAP well controlled.  Now on new machine the same reciprocal results.  He changed to [employer], now bought up by [employer]. Continue on these settings.   Dr. Radford Pax  ( cardiology) is following orthostatic hypotension, HTN and cardiomyopathy. Triglyceride elevated-  alcohol related? Had glaucoma.    Larey Seat, MD      Cc:  Dr Dagmar Hait.

## 2021-09-02 DIAGNOSIS — H3530 Unspecified macular degeneration: Secondary | ICD-10-CM | POA: Diagnosis not present

## 2021-09-02 DIAGNOSIS — N1832 Chronic kidney disease, stage 3b: Secondary | ICD-10-CM | POA: Diagnosis not present

## 2021-09-02 DIAGNOSIS — I10 Essential (primary) hypertension: Secondary | ICD-10-CM | POA: Diagnosis not present

## 2021-09-02 DIAGNOSIS — E669 Obesity, unspecified: Secondary | ICD-10-CM | POA: Diagnosis not present

## 2021-09-02 DIAGNOSIS — I8013 Phlebitis and thrombophlebitis of femoral vein, bilateral: Secondary | ICD-10-CM | POA: Diagnosis not present

## 2021-09-02 DIAGNOSIS — E785 Hyperlipidemia, unspecified: Secondary | ICD-10-CM | POA: Diagnosis not present

## 2021-09-02 DIAGNOSIS — G3184 Mild cognitive impairment, so stated: Secondary | ICD-10-CM | POA: Diagnosis not present

## 2021-09-02 DIAGNOSIS — I2584 Coronary atherosclerosis due to calcified coronary lesion: Secondary | ICD-10-CM | POA: Diagnosis not present

## 2021-09-02 DIAGNOSIS — C679 Malignant neoplasm of bladder, unspecified: Secondary | ICD-10-CM | POA: Diagnosis not present

## 2021-09-02 DIAGNOSIS — R7301 Impaired fasting glucose: Secondary | ICD-10-CM | POA: Diagnosis not present

## 2021-09-02 DIAGNOSIS — E291 Testicular hypofunction: Secondary | ICD-10-CM | POA: Diagnosis not present

## 2021-09-03 DIAGNOSIS — M1711 Unilateral primary osteoarthritis, right knee: Secondary | ICD-10-CM | POA: Diagnosis not present

## 2021-09-08 ENCOUNTER — Encounter: Payer: Self-pay | Admitting: Internal Medicine

## 2021-09-08 ENCOUNTER — Ambulatory Visit: Payer: PPO | Admitting: Internal Medicine

## 2021-09-08 VITALS — BP 134/80 | HR 72 | Ht 74.5 in | Wt 272.2 lb

## 2021-09-08 DIAGNOSIS — R195 Other fecal abnormalities: Secondary | ICD-10-CM | POA: Diagnosis not present

## 2021-09-08 DIAGNOSIS — R159 Full incontinence of feces: Secondary | ICD-10-CM

## 2021-09-08 DIAGNOSIS — G4733 Obstructive sleep apnea (adult) (pediatric): Secondary | ICD-10-CM | POA: Diagnosis not present

## 2021-09-08 MED ORDER — METRONIDAZOLE 250 MG PO TABS: 250.0000 mg | ORAL_TABLET | Freq: Four times a day (QID) | ORAL | 0 refills | Status: AC

## 2021-09-08 MED ORDER — HYDROCORTISONE (PERIANAL) 2.5 % EX CREA: 1.0000 "application " | TOPICAL_CREAM | Freq: Two times a day (BID) | CUTANEOUS | 6 refills | Status: DC

## 2021-09-08 NOTE — Patient Instructions (Signed)
Start Citrucel 2 tablespoons in 8 ounces of liquid daily.  We have sent the following medications to your pharmacy for you to pick up at your convenience: Hydrocortisone 2.5% cream - apply small amount on Preparation H suppository nightly per rectum for 14 days.  Flagyl 250 mg four times daily for a week.   Use some protective undergarments .  If you are age 70 or older, your body mass index should be between 23-30. Your Body mass index is 34.49 kg/m. If this is out of the aforementioned range listed, please consider follow up with your Primary Care Provider.  If you are age 88 or younger, your body mass index should be between 19-25. Your Body mass index is 34.49 kg/m. If this is out of the aformentioned range listed, please consider follow up with your Primary Care Provider.   ________________________________________________________  The [facility] providers would like to encourage you to use [facility] to communicate with providers for non-urgent requests or questions.  Due to long hold times on the telephone, sending your provider a message by [facility] may be a faster and more efficient way to get a response.  Please allow 48 business hours for a response.  Please remember that this is for non-urgent requests.  _______________________________________________________

## 2021-09-08 NOTE — Progress Notes (Signed)
HISTORY OF PRESENT ILLNESS:  Brian Cortez is a 70 y.o. male with multiple medical problems as listed below.  He presents today regarding problems with loose stools and fecal incontinence.  He was last seen in this office January 18, 2019 regarding GERD.  Previous problems with minor fecal incontinence were improved after fiber supplementation.  He does have a history of adenomatous colon polyps with his surveillance up-to-date.  For GERD he takes pantoprazole.  This helps nicely.  He reports that his bowel habits were doing well until he was placed on Aricept.  Thereafter more frequent and more loose bowel movements with issues with fecal soilage.  His last complete colonoscopy was 2017.  He also complains of increased intestinal gas.  He is planning a trip to [place] and hopes to have assistance with some of these issues.  REVIEW OF SYSTEMS:  All non-GI ROS negative unless otherwise stated in the HPI except for back pain, arthritis, sinus allergy  Past Medical History:  Diagnosis Date   Anxiety    Bladder tumor    Carpal tunnel syndrome, left    intermittant   Chronic seasonal allergic rhinitis    Coronary artery disease    per Dr Aundra Dubin (cardiologist) note --- noted on CT chest coronary calcification -- ETT-Cardiolite normal in 2014   DDD (degenerative disc disease), cervical    Dilated cardiomyopathy ([place])    Diverticulosis of colon    Fecal incontinence    GERD (gastroesophageal reflux disease)    Grade II diastolic dysfunction 17/49/4496   per ECHO    Hemorrhoids    Hiatal hernia    History of adenomatous polyp of colon    2001;  2007;  2012   History of bladder cancer urologist-  dr Gaynelle Arabian   11-06-2013  s/p TURBT and BCG tx's   History of kidney stones    about 45 years ago   History of panic attacks    History of urethral stricture    Hyperlipidemia    Hypertension    Hypogonadism in male    LVH (left ventricular hypertrophy) 06/17/2017   Mild, noted on ECHO    Nonischemic cardiomyopathy [facility])    cardiologist-  dr Aundra Dubin-- per last echo 01/ 2016 ef 50-55%  (up from 09/ 2014 ef 45-50%)   OSA on CPAP    since 2014   Pneumonia 11/2010   Left lower lobe   Pulmonary hypertension ([place]) 06/17/2017   Mild, per ECHO    Past Surgical History:  Procedure Laterality Date   CARDIOVASCULAR STRESS TEST  08-21-2013  dr Aundra Dubin   normal nuclear study w/ no ischemia/  normal LV function and wall motion , ef 63%   CATARACT EXTRACTION W/ INTRAOCULAR LENS  IMPLANT, BILATERAL  2013   COLONOSCOPY  last one 03-03-2016   CYSTO/  DIRECT VISION INTERNAL URETEROTOMY  11/25/2006   LAPAROSCOPIC CHOLECYSTECTOMY  10/15/2006   MENISCUS REPAIR Right 12/2020   ORBITAL FRACTURE SURGERY  1986   left eye:  Jan 1986--- fixation of fractures, repair of optic nerve decompression , and complex closure   TONSILLECTOMY  child   TRANSTHORACIC ECHOCARDIOGRAM  12-16-2014   dr Aundra Dubin   grade 1 diastolic dysfunction, ef 75-91% (improved from last study in 2014 , previous ef 45-50%) /  trivial MR and TR/  mild LAE/  inferior vena cava dilated, respirophasic diameter changes were blunted (<50%), consistant w/ elevated central venous pressure   TRANSURETHRAL RESECTION OF BLADDER TUMOR Bilateral 05/16/2018   Procedure: TRANSURETHRAL  RESECTION OF BLADDER TUMOR (TURBT)/ RETROGRADE;  Surgeon: Festus Aloe, MD;  Location: [facility];  Service: Urology;  Laterality: Bilateral;   TRANSURETHRAL RESECTION OF BLADDER TUMOR WITH GYRUS (TURBT-GYRUS) N/A 11/06/2013   Procedure: TRANSURETHRAL RESECTION OF BLADDER TUMOR WITH GYRUS (TURBT-GYRUS);  Surgeon: Ailene Rud, MD;  Location: [facility];  Service: Urology;  Laterality: N/A;   TRANSURETHRAL RESECTION OF BLADDER TUMOR WITH MITOMYCIN-C N/A 12/09/2016   Procedure: CYSTOSCOPY TRANSURETHRAL RESECTION OF BLADDER TUMOR WITH MITOMYCIN-C;  Surgeon: Carolan Clines, MD;  Location: [place];  Service: Urology;   Laterality: N/A;   VARICOSE VEIN SURGERY  2002   VASECTOMY      Social History Brian Cortez  reports that he has never smoked. He has never used smokeless tobacco. He reports current alcohol use. He reports that he does not use drugs.  family history includes Heart attack in his paternal grandfather and paternal uncle; Heart attack (age of onset: 68) in his father; Heart attack (age of onset: 60) in his maternal uncle; Prostate cancer in his paternal uncle; Stroke (age of onset: 2) in his father.  Allergies  Allergen Reactions   Bee Pollen     Other reaction(s): Cough "sinus issues"   Pollen Extract Cough    "sinus issues"   Sulfamethoxazole Nausea And Vomiting   Sulfamethoxazole-Trimethoprim Rash       PHYSICAL EXAMINATION: Vital signs: BP 134/80 (BP Location: Left Arm, Patient Position: Sitting, Cuff Size: Normal)   Pulse 72   Ht 6' 2.5" (1.892 m) Comment: height measured without shoes  Wt 272 lb 4 oz (123.5 kg)   BMI 34.49 kg/m   Constitutional: generally well-appearing, no acute distress Psychiatric: alert and oriented x3, cooperative Eyes: extraocular movements intact, anicteric, conjunctiva pink Mouth: oral pharynx moist, no lesions Neck: supple no lymphadenopathy Cardiovascular: heart regular rate and rhythm, no murmur Lungs: clear to auscultation bilaterally Abdomen: soft, obese, nontender, nondistended, no obvious ascites, no peritoneal signs, normal bowel sounds, no organomegaly Rectal: No external abnormalities.  Sensation intact.  Diminished but not absent rectal tone.  No internal mass or tenderness.  He does have visible internal hemorrhoids.   Extremities: no lower extremity edema bilaterally Skin: no lesions on visible extremities Neuro: No focal deficits.  Cranial nerves intact  ASSESSMENT:  1.  Increased frequency and loose stools secondary to Aricept 2.  Fecal leakage.  Decreased rectal tone in combination with loose stools. 3.  Increased  intestinal gas 4.  History of adenomatous polyps.  Surveillance up-to-date 5.  GERD.  Controlled on PPI   PLAN:  1.  Citrucel 2 tablespoons daily to improve bowel consistency 2.  Anusol HC rectal suppositories.  1 at night for 2 weeks.  Multiple refills 3.  Wear protective undergarments. 4.  Prescribe Flagyl 250 mg 4 times daily for 1 week.  No refills.  Advised not to drink alcohol when taking this medication.  He understands 5.  Low-dose Imodium as needed 6 continue PPI 7.  Surveillance colonoscopy 2027 8.  Interval follow-up as needed A total time of 30 minutes was spent preparing to see the patient, reviewing test, obtaining comprehensive history, performing comprehensive physical examination, counseling the patient regarding the above listed issues, ordering medications, and documenting clinical information in the health record

## 2021-09-09 DIAGNOSIS — E291 Testicular hypofunction: Secondary | ICD-10-CM | POA: Diagnosis not present

## 2021-09-10 DIAGNOSIS — C672 Malignant neoplasm of lateral wall of bladder: Secondary | ICD-10-CM | POA: Diagnosis not present

## 2021-09-28 DIAGNOSIS — E291 Testicular hypofunction: Secondary | ICD-10-CM | POA: Diagnosis not present

## 2021-10-07 DIAGNOSIS — Z09 Encounter for follow-up examination after completed treatment for conditions other than malignant neoplasm: Secondary | ICD-10-CM | POA: Diagnosis not present

## 2021-10-07 DIAGNOSIS — I83812 Varicose veins of left lower extremities with pain: Secondary | ICD-10-CM | POA: Diagnosis not present

## 2021-10-07 DIAGNOSIS — I83892 Varicose veins of left lower extremities with other complications: Secondary | ICD-10-CM | POA: Diagnosis not present

## 2021-10-07 DIAGNOSIS — I872 Venous insufficiency (chronic) (peripheral): Secondary | ICD-10-CM | POA: Diagnosis not present

## 2021-10-08 ENCOUNTER — Encounter: Payer: PPO | Attending: Psychology | Admitting: Psychology

## 2021-10-08 ENCOUNTER — Encounter: Payer: Self-pay | Admitting: Psychology

## 2021-10-08 ENCOUNTER — Other Ambulatory Visit: Payer: Self-pay

## 2021-10-08 DIAGNOSIS — F09 Unspecified mental disorder due to known physiological condition: Secondary | ICD-10-CM | POA: Diagnosis not present

## 2021-10-08 NOTE — Progress Notes (Signed)
Neuropsychological Consultation   Patient:   Brian Cortez   DOB:   [birth year]  MR Number:  [id number]  Location:  [facility] [place], [place] [id number] [place] [address] Dept: [phone]           Date of Service:   10/08/2021  Start Time:   2 PM End Time:   4 PM  Provider/Observer:  Ilean Skill, Psy.D.       Clinical Neuropsychologist       Billing Code/Service: 96116/96121  Chief Complaint:    Brian Cortez is a 70 year old male referred by Daymon Larsen, MD for neuropsychological evaluation as part of a larger neurological work-up.  The patient has been followed by Dr. Brett Fairy for many years for obstructive sleep apnea and has been extremely compliant with his CPAP device over the years.  However, he has begun reporting increasing difficulties with his memory.  Recent MoCA cognitive testing has shown a decline from previous assessments with particular concerns about difficulties on serial sevens and following multistep commands.  He did well on other aspects of the MoCA testing with the exception of new trouble with completing the Trail Making Test.  He has been started on he was started on Aricept daily that is well up to 2 mg a day.  Recent MRI was unremarkable.  Reason for Service:  Brian Cortez is a 70 year old male referred by Daymon Larsen, MD for neuropsychological evaluation as part of a larger neurological work-up.  The patient has been followed by Dr. Brett Fairy for many years for obstructive sleep apnea and has been extremely compliant with his CPAP device over the years.  However, he has begun reporting increasing difficulties with his memory with first report to Dr. Brett Fairy occurring in March of this year.  Recent MoCA cognitive testing has shown a decline from previous assessments with particular concerns about difficulties on serial  sevens and following multistep commands.  He did well on other aspects of the MoCA testing with the exception of new trouble with completing the Trail Making Test.  He has been started on he was started on Aricept daily that is well up to 2 mg a day.  Recent MRI was unremarkable.  During the clinical interview today, the patient reports that he continues to be very compliant with his CPAP use and feels like the addition of Aricept to his medication regimen has improved his memory functions as much is 50% improvement.  The patient reports that he began noticing some memory changes approximately 3 years ago and describes it as a gradual change over this time rather than stepwise changes with some recent improvement with Aricept.  The patient reports on rare occasions that he will have some difficulties with word finding but this is certainly not every day or once per week and frequency.  He denies any tremors or any visual hallucinations.  He also denies any geographic disorientation's or other changes in visual-spatial or visual processing changes.  The patient reports that he has had some stressors around a reoccurrence of his bladder cancer.  The patient was treated for bladder cancer and has been followed up and they recently found a reoccurrence which has been addressed.  He has had various interventions for his bladder cancer including specific targeted chemotherapy, BCG wash etc.  The patient reports that his mood is very positive and denies any symptoms of anxiety or depression.  The patient reports that he started taking Lexapro many years ago to address infrequent but significant panic events.  The patient reports that his perception and worries during these panic events had to do with health concerns as his father died of a heart attack i 20 and the patient's older brother had become anxious and obsessive with fears that his older brother would die at the same age.  His older brother did not pass away at  that time.  The patient reports that he would have fears that would be exacerbated by his brother's fears leading to some panic events.  The patient reports that he has had not had any side effects from the Lexapro and feels like it is quite helpful and keeping the panic events at [place].  The patient describes his only significant hospitalizations as having to do with his bladder cancer with 2 previous hospitalizations, a meniscus tear and upcoming right knee replacement planned in May 2023.  He reports that he is still having some small tumors develop in his bladder which are being addressed through oncology.  The patient acknowledges some worry with a reoccurrence of these tumors but does not feel like he is being overwhelmed with anxiety regarding them.  The patient reports that he has good sleep patterns with good CPAP compliance, that his appetite is good and there is no specific changes.  There is no significant history of progressive dementia is that the patient is aware of.  His father passed away from a heart attack at 87 years of age and his mother passed away at 67 years of age.  The patient denies any tremors, visual hallucinations or significant changes in geographic orientation.  No other specific symptoms besides his subjective memory changes are noted.  The patient has a 83 year old daughter who has been diagnosed with ADD/OCD and anxiety and has a 66 year old daughter who is in good health with no psychiatric or neurological history.  Behavioral Observation: Brian Cortez  presents as a 70 y.o.-year-old Right handed Caucasian Male who appeared his stated age. his dress was Appropriate and he was Well Groomed and his manners were Appropriate to the situation.  his participation was indicative of Appropriate and Attentive behaviors.  There were not physical disabilities noted.  he displayed an appropriate level of cooperation and motivation.     Interactions:    Active  Appropriate  Attention:   within normal limits and attention span and concentration were age appropriate  Memory:   within normal limits; recent and remote memory intact  Visuo-spatial:  not examined  Speech (Volume):  normal  Speech:   normal; normal  Thought Process:  Coherent and Relevant  Though Content:  WNL; not suicidal and not homicidal  Orientation:   person, place, time/date, and situation  Judgment:   Good  Planning:   Good  Affect:    Appropriate  Mood:    Euthymic  Insight:   Good  Intelligence:   high  Marital Status/Living: The patient was born and raised in [place] with no siblings.  He currently lives with his wife of 5-1/2 years.  They have 2 adult daughters.  Current Employment: The patient is retired.  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  Hobbies and interest include golf, reading and exercise.  Substance Use:  No concerns of substance abuse are reported.  The  patient denies any substance use or abuse and only has occasional wine or bourbon and this is not a significant use history.  Education:   Secretary/administrator the patient graduated with his bachelor's of science degree in business administration from the [employer] with a 3.0 grade point average.  The patient always did very well in math classes and his best subject was accounting.  He never repeated any grades.  The patient was on the Dean's list.  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  Medical History:   Past Medical History:  Diagnosis Date   Anxiety    Bladder tumor    Carpal tunnel syndrome, left    intermittant   Chronic seasonal allergic rhinitis    Coronary artery disease    per Dr Aundra Dubin (cardiologist) note --- noted on CT chest coronary calcification -- ETT-Cardiolite  normal in 2014   DDD (degenerative disc disease), cervical    Dilated cardiomyopathy ([place])    Diverticulosis of colon    Fecal incontinence    GERD (gastroesophageal reflux disease)    Grade II diastolic dysfunction 16/08/9603   per ECHO    Hemorrhoids    Hiatal hernia    History of adenomatous polyp of colon    2001;  2007;  2012   History of bladder cancer urologist-  dr Gaynelle Arabian   11-06-2013  s/p TURBT and BCG tx's   History of kidney stones    about 45 years ago   History of panic attacks    History of urethral stricture    Hyperlipidemia    Hypertension    Hypogonadism in male    LVH (left ventricular hypertrophy) 06/17/2017   Mild, noted on ECHO   Nonischemic cardiomyopathy [facility])    cardiologist-  dr Aundra Dubin-- per last echo 01/ 2016 ef 50-55%  (up from 09/ 2014 ef 45-50%)   OSA on CPAP    since 2014   Pneumonia 11/2010   Left lower lobe   Pulmonary hypertension ([place]) 06/17/2017   Mild, per ECHO         Patient Active Problem List   Diagnosis Date Noted   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   Chronic kidney disease, stage 3b ([place]) 05/15/2021   COVID-19 05/15/2021   Mild cognitive disorder 05/15/2021   Bilateral impacted cerumen 02/13/2021   Deviated septum 02/13/2021   Epistaxis 02/13/2021   Myopic macular degeneration 04/02/2020   Posterior vitreous detachment of right eye 04/02/2020   Early stage nonexudative age-related macular degeneration of both eyes 04/02/2020   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 04/02/2020   Low back pain 03/04/2020   Other specified local infections of the skin and subcutaneous tissue 08/20/2019   Secondary polycythemia 08/06/2019   Atherosclerosis of coronary artery 02/07/2019   Tinea cruris 10/16/2018   Neoplastic malignant related fatigue 09/20/2018   Convalescence after chemotherapy 09/20/2018   Anterior urethral stricture 08/04/2018   Bladder trabeculation 08/04/2018    Diverticula, bladder acquired 08/04/2018   Erectile dysfunction due to diseases classified elsewhere 08/04/2018   Low testosterone in male 08/04/2018   Primary open angle glaucoma (POAG) of both eyes, moderate stage 02/10/2018   Obesity with body mass index greater than 30 09/14/2017   Incontinence of feces 04/22/2017   Pain in right knee 01/25/2017   Nasal congestion with rhinorrhea 09/08/2016   Chronic seasonal allergic rhinitis due to pollen 09/08/2016  Impaired fasting glucose 12/10/2015   Postherpetic neuralgia 11/12/2015   Herpes zoster without complication 94/17/4081   Acute recurrent ethmoidal sinusitis 09/09/2015   Bronchitis 09/09/2015   OSA on CPAP 09/09/2015   Rhinitis due to pollen 09/09/2015   Plantar fascial fibromatosis 11/25/2014   CAP (community acquired pneumonia) 10/15/2014   Allergic rhinitis 10/15/2014   Atelectasis 09/30/2014   Abnormal weight loss 09/06/2014   Cough 09/06/2014   CPAP rhinitis 08/27/2014   Anxiety disorder 03/15/2014   Colorectal polyps 03/15/2014   Glaucoma 03/15/2014   Screening for depression 03/15/2014   Male hypogonadism 03/15/2014   Other ill-defined heart diseases 03/15/2014   Bladder cancer ([place]) 11/06/2013   Sleep apnea with use of continuous positive airway pressure (CPAP) 08/23/2013   Coronary artery calcification seen on CAT scan 08/17/2013   Cardiomyopathy ([place]) 44/81/8563   Diastolic dysfunction 14/97/0263   Testosterone deficiency 10/18/2012   CARPAL TUNNEL SYNDROME, LEFT 03/12/2010   CERVICAL RADICULOPATHY 03/12/2010   NONSPECIFIC ABNORMAL ELECTROCARDIOGRAM 10/08/2009   ADENOMATOUS COLONIC POLYP 04/06/2008   DIVERTICULOSIS, COLON 04/06/2008   HIATAL HERNIA 12/18/2007   HYPERLIPIDEMIA 10/20/2007   Essential hypertension 05/30/2007   G E R D 05/30/2007   HYPERGLYCEMIA 12/19/2006          Psychiatric History:  The patient does have a history of some degree of anxiety and has been diagnosed with an anxiety disorder.   He has had panic attacks in the past but they have all been well managed with Lexapro.  Family Med/Psych History:  Family History  Problem Relation Age of Onset   Heart attack Father 75   Stroke Father 24   Heart attack Paternal Grandfather        in 70s   Prostate cancer Paternal Uncle    Heart attack Paternal Uncle         > 3   Heart attack Maternal Uncle 80       > 55   Colon cancer Neg Hx    Diabetes Neg Hx     Impression/DX:  Brian Cortez is a 70 year old male referred by Daymon Larsen, MD for neuropsychological evaluation as part of a larger neurological work-up.  The patient has been followed by Dr. Brett Fairy for many years for obstructive sleep apnea and has been extremely compliant with his CPAP device over the years.  However, he has begun reporting increasing difficulties with his memory.  Recent MoCA cognitive testing has shown a decline from previous assessments with particular concerns about difficulties on serial sevens and following multistep commands.  He did well on other aspects of the MoCA testing with the exception of new trouble with completing the Trail Making Test.  He has been started on he was started on Aricept daily that is well up to 2 mg a day.  Recent MRI was unremarkable.  Disposition/Plan:  We have set the patient up for formal neuropsychological testing.  He will complete the Wechsler Adult Intelligence Scale and Wechsler Memory Scale's we will also administer the D-KEFS as well for executive functioning etc.  Once these 2 foundational batteries are completed a determination will be made as to possible need for further testing.  Diagnosis:    Mild cognitive disorder         Electronically Signed   _______________________ Ilean Skill, Psy.D. Clinical Neuropsychologist

## 2021-10-09 ENCOUNTER — Other Ambulatory Visit: Payer: PPO | Admitting: *Deleted

## 2021-10-09 ENCOUNTER — Telehealth: Payer: Self-pay | Admitting: Psychology

## 2021-10-09 DIAGNOSIS — I420 Dilated cardiomyopathy: Secondary | ICD-10-CM

## 2021-10-09 DIAGNOSIS — G4733 Obstructive sleep apnea (adult) (pediatric): Secondary | ICD-10-CM | POA: Diagnosis not present

## 2021-10-09 DIAGNOSIS — I2510 Atherosclerotic heart disease of native coronary artery without angina pectoris: Secondary | ICD-10-CM | POA: Diagnosis not present

## 2021-10-09 DIAGNOSIS — E782 Mixed hyperlipidemia: Secondary | ICD-10-CM

## 2021-10-09 DIAGNOSIS — I10 Essential (primary) hypertension: Secondary | ICD-10-CM

## 2021-10-09 LAB — LIPID PANEL
Chol/HDL Ratio: 5.5 ratio — ABNORMAL HIGH (ref 0.0–5.0)
Cholesterol, Total: 171 mg/dL (ref 100–199)
HDL: 31 mg/dL — ABNORMAL LOW (ref >39)
LDL Chol Calc (NIH): 90 mg/dL (ref 0–99)
Triglycerides: 297 mg/dL — ABNORMAL HIGH (ref 0–149)
VLDL Cholesterol Cal: 50 mg/dL — ABNORMAL HIGH (ref 5–40)

## 2021-10-09 LAB — ALT: ALT: 42 IU/L (ref 0–44)

## 2021-10-09 NOTE — Telephone Encounter (Signed)
Per Dr Sima Matas Schedule for 4 Hrs with [facility] for WAIS WMS + DKEFS. Also schedule for Interpretation and Feedback with [place]

## 2021-10-12 DIAGNOSIS — H401132 Primary open-angle glaucoma, bilateral, moderate stage: Secondary | ICD-10-CM | POA: Diagnosis not present

## 2021-10-12 DIAGNOSIS — E291 Testicular hypofunction: Secondary | ICD-10-CM | POA: Diagnosis not present

## 2021-10-20 ENCOUNTER — Other Ambulatory Visit: Payer: Self-pay

## 2021-10-20 ENCOUNTER — Emergency Department (HOSPITAL_COMMUNITY)
Admission: EM | Admit: 2021-10-20 | Discharge: 2021-10-21 | Disposition: A | Discharge status: Home / Self Care | Payer: PPO | Attending: Emergency Medicine | Admitting: Emergency Medicine

## 2021-10-20 ENCOUNTER — Encounter (HOSPITAL_COMMUNITY): Payer: Self-pay | Admitting: Emergency Medicine

## 2021-10-20 DIAGNOSIS — Z79899 Other long term (current) drug therapy: Secondary | ICD-10-CM | POA: Diagnosis not present

## 2021-10-20 DIAGNOSIS — K5792 Diverticulitis of intestine, part unspecified, without perforation or abscess without bleeding: Secondary | ICD-10-CM

## 2021-10-20 DIAGNOSIS — K760 Fatty (change of) liver, not elsewhere classified: Secondary | ICD-10-CM | POA: Diagnosis not present

## 2021-10-20 DIAGNOSIS — Z7982 Long term (current) use of aspirin: Secondary | ICD-10-CM | POA: Insufficient documentation

## 2021-10-20 DIAGNOSIS — R1032 Left lower quadrant pain: Secondary | ICD-10-CM | POA: Diagnosis present

## 2021-10-20 DIAGNOSIS — K429 Umbilical hernia without obstruction or gangrene: Secondary | ICD-10-CM | POA: Insufficient documentation

## 2021-10-20 DIAGNOSIS — Z8551 Personal history of malignant neoplasm of bladder: Secondary | ICD-10-CM | POA: Diagnosis not present

## 2021-10-20 DIAGNOSIS — K5732 Diverticulitis of large intestine without perforation or abscess without bleeding: Secondary | ICD-10-CM | POA: Diagnosis not present

## 2021-10-20 DIAGNOSIS — I2510 Atherosclerotic heart disease of native coronary artery without angina pectoris: Secondary | ICD-10-CM | POA: Diagnosis not present

## 2021-10-20 DIAGNOSIS — I129 Hypertensive chronic kidney disease with stage 1 through stage 4 chronic kidney disease, or unspecified chronic kidney disease: Secondary | ICD-10-CM | POA: Diagnosis not present

## 2021-10-20 DIAGNOSIS — N1832 Chronic kidney disease, stage 3b: Secondary | ICD-10-CM | POA: Insufficient documentation

## 2021-10-20 NOTE — ED Triage Notes (Signed)
Pt reports LLQ pain  starting Tuesday morning w/ some lose stools.  Pt states the pain decreased but started to have upper quad pain this evening.  No urinary symptoms.

## 2021-10-21 ENCOUNTER — Emergency Department (HOSPITAL_COMMUNITY): Payer: PPO

## 2021-10-21 DIAGNOSIS — K760 Fatty (change of) liver, not elsewhere classified: Secondary | ICD-10-CM | POA: Diagnosis not present

## 2021-10-21 LAB — COMPREHENSIVE METABOLIC PANEL
ALT: 34 U/L (ref 0–44)
AST: 40 U/L (ref 15–41)
Albumin: 3.9 g/dL (ref 3.5–5.0)
Alkaline Phosphatase: 45 U/L (ref 38–126)
Anion gap: 8 (ref 5–15)
BUN: 16 mg/dL (ref 8–23)
CO2: 27 mmol/L (ref 22–32)
Calcium: 9.5 mg/dL (ref 8.9–10.3)
Chloride: 102 mmol/L (ref 98–111)
Creatinine, Ser: 1.42 mg/dL — ABNORMAL HIGH (ref 0.61–1.24)
GFR, Estimated: 53 mL/min — ABNORMAL LOW (ref >60)
Glucose, Bld: 111 mg/dL — ABNORMAL HIGH (ref 70–99)
Potassium: 4.1 mmol/L (ref 3.5–5.1)
Sodium: 137 mmol/L (ref 135–145)
Total Bilirubin: 0.8 mg/dL (ref 0.3–1.2)
Total Protein: 6.6 g/dL (ref 6.5–8.1)

## 2021-10-21 LAB — CBC WITH DIFFERENTIAL/PLATELET
Abs Immature Granulocytes: 0.02 10*3/uL (ref 0.00–0.07)
Basophils Absolute: 0 10*3/uL (ref 0.0–0.1)
Basophils Relative: 1 %
Eosinophils Absolute: 0.1 10*3/uL (ref 0.0–0.5)
Eosinophils Relative: 1 %
HCT: 49.2 % (ref 39.0–52.0)
Hemoglobin: 16.4 g/dL (ref 13.0–17.0)
Immature Granulocytes: 0 %
Lymphocytes Relative: 27 %
Lymphs Abs: 1.8 10*3/uL (ref 0.7–4.0)
MCH: 31.1 pg (ref 26.0–34.0)
MCHC: 33.3 g/dL (ref 30.0–36.0)
MCV: 93.4 fL (ref 80.0–100.0)
Monocytes Absolute: 0.7 10*3/uL (ref 0.1–1.0)
Monocytes Relative: 11 %
Neutro Abs: 3.9 10*3/uL (ref 1.7–7.7)
Neutrophils Relative %: 60 %
Platelets: 213 10*3/uL (ref 150–400)
RBC: 5.27 MIL/uL (ref 4.22–5.81)
RDW: 13.1 % (ref 11.5–15.5)
WBC: 6.5 10*3/uL (ref 4.0–10.5)
nRBC: 0 % (ref 0.0–0.2)

## 2021-10-21 LAB — URINALYSIS, ROUTINE W REFLEX MICROSCOPIC
Bilirubin Urine: NEGATIVE
Glucose, UA: NEGATIVE mg/dL
Hgb urine dipstick: NEGATIVE
Ketones, ur: NEGATIVE mg/dL
Leukocytes,Ua: NEGATIVE
Nitrite: NEGATIVE
Protein, ur: NEGATIVE mg/dL
Specific Gravity, Urine: 1.015 (ref 1.005–1.030)
pH: 6 (ref 5.0–8.0)

## 2021-10-21 MED ORDER — ONDANSETRON HCL 4 MG PO TABS: 4.0000 mg | ORAL_TABLET | Freq: Four times a day (QID) | ORAL | 0 refills | Status: DC

## 2021-10-21 MED ORDER — ONDANSETRON 4 MG PO TBDP
4.0000 mg | ORAL_TABLET | Freq: Once | ORAL | Status: AC
  Administered 2021-10-21: 4 mg via ORAL
  Filled 2021-10-21: qty 1

## 2021-10-21 MED ORDER — AMOXICILLIN-POT CLAVULANATE 875-125 MG PO TABS
1.0000 | ORAL_TABLET | Freq: Once | ORAL | Status: AC
  Administered 2021-10-21: 1 via ORAL
  Filled 2021-10-21: qty 1

## 2021-10-21 MED ORDER — IOHEXOL 350 MG/ML SOLN
100.0000 mL | Freq: Once | INTRAVENOUS | Status: AC | PRN
  Administered 2021-10-21: 100 mL via INTRAVENOUS

## 2021-10-21 MED ORDER — HYDROCODONE-ACETAMINOPHEN 5-325 MG PO TABS
1.0000 | ORAL_TABLET | Freq: Once | ORAL | Status: AC
  Administered 2021-10-21: 1 via ORAL
  Filled 2021-10-21: qty 1

## 2021-10-21 MED ORDER — AMOXICILLIN-POT CLAVULANATE 875-125 MG PO TABS: 1.0000 | ORAL_TABLET | Freq: Two times a day (BID) | ORAL | 0 refills | Status: AC

## 2021-10-21 NOTE — ED Notes (Signed)
Pt verbalized understanding of d/c instructions, meds, and followup care. Denies questions. VSS, no distress noted. Steady gait to exit with all belongings.  ?

## 2021-10-21 NOTE — ED Provider Notes (Signed)
[facility] EMERGENCY DEPARTMENT Provider Note   CSN: [id number] Arrival date & time: 10/20/21  2213     History Chief Complaint  Patient presents with   Abdominal Pain    Brian Cortez is a 70 y.o. male.  The history is provided by the patient.  Abdominal Pain Pain location:  LLQ Pain quality: aching   Pain radiates to:  Does not radiate Pain severity:  Mild Onset quality:  Gradual Duration:  2 days Timing:  Constant Progression:  Improving Chronicity:  New Context: not previous surgeries   Relieved by:  Nothing Worsened by:  Nothing Associated symptoms: nausea   Associated symptoms: no anorexia, no chest pain, no chills, no cough, no dysuria, no fever, no hematuria, no shortness of breath, no sore throat and no vomiting       Past Medical History:  Diagnosis Date   Anxiety    Bladder tumor    Carpal tunnel syndrome, left    intermittant   Chronic seasonal allergic rhinitis    Coronary artery disease    per Dr Aundra Dubin (cardiologist) note --- noted on CT chest coronary calcification -- ETT-Cardiolite normal in 2014   DDD (degenerative disc disease), cervical    Dilated cardiomyopathy ([place])    Diverticulosis of colon    Fecal incontinence    GERD (gastroesophageal reflux disease)    Grade II diastolic dysfunction 65/78/4696   per ECHO    Hemorrhoids    Hiatal hernia    History of adenomatous polyp of colon    2001;  2007;  2012   History of bladder cancer urologist-  dr Gaynelle Arabian   11-06-2013  s/p TURBT and BCG tx's   History of kidney stones    about 45 years ago   History of panic attacks    History of urethral stricture    Hyperlipidemia    Hypertension    Hypogonadism in male    LVH (left ventricular hypertrophy) 06/17/2017   Mild, noted on ECHO   Nonischemic cardiomyopathy [facility])    cardiologist-  dr Aundra Dubin-- per last echo 01/ 2016 ef 50-55%  (up from 09/ 2014 ef 45-50%)   OSA on CPAP    since 2014   Pneumonia 11/2010    Left lower lobe   Pulmonary hypertension ([place]) 06/17/2017   Mild, per ECHO    Patient Active Problem List   Diagnosis Date Noted   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   Chronic kidney disease, stage 3b ([place]) 05/15/2021   COVID-19 05/15/2021   Mild cognitive disorder 05/15/2021   Bilateral impacted cerumen 02/13/2021   Deviated septum 02/13/2021   Epistaxis 02/13/2021   Myopic macular degeneration 04/02/2020   Posterior vitreous detachment of right eye 04/02/2020   Early stage nonexudative age-related macular degeneration of both eyes 04/02/2020   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 04/02/2020   Low back pain 03/04/2020   Other specified local infections of the skin and subcutaneous tissue 08/20/2019   Secondary polycythemia 08/06/2019   Atherosclerosis of coronary artery 02/07/2019   Tinea cruris 10/16/2018   Neoplastic malignant related fatigue 09/20/2018   Convalescence after chemotherapy 09/20/2018   Anterior urethral stricture 08/04/2018   Bladder trabeculation 08/04/2018   Diverticula, bladder acquired 08/04/2018   Erectile dysfunction due to diseases classified elsewhere 08/04/2018   Low testosterone in male 08/04/2018   Primary open angle glaucoma (POAG) of both eyes, moderate stage 02/10/2018   Obesity with body mass index greater than  30 09/14/2017   Incontinence of feces 04/22/2017   Pain in right knee 01/25/2017   Nasal congestion with rhinorrhea 09/08/2016   Chronic seasonal allergic rhinitis due to pollen 09/08/2016   Impaired fasting glucose 12/10/2015   Postherpetic neuralgia 11/12/2015   Herpes zoster without complication 77/41/2878   Acute recurrent ethmoidal sinusitis 09/09/2015   Bronchitis 09/09/2015   OSA on CPAP 09/09/2015   Rhinitis due to pollen 09/09/2015   Plantar fascial fibromatosis 11/25/2014   CAP (community acquired pneumonia) 10/15/2014   Allergic rhinitis 10/15/2014   Atelectasis 09/30/2014    Abnormal weight loss 09/06/2014   Cough 09/06/2014   CPAP rhinitis 08/27/2014   Anxiety disorder 03/15/2014   Colorectal polyps 03/15/2014   Glaucoma 03/15/2014   Screening for depression 03/15/2014   Male hypogonadism 03/15/2014   Other ill-defined heart diseases 03/15/2014   Bladder cancer ([place]) 11/06/2013   Sleep apnea with use of continuous positive airway pressure (CPAP) 08/23/2013   Coronary artery calcification seen on CAT scan 08/17/2013   Cardiomyopathy ([place]) 67/67/2094   Diastolic dysfunction 70/96/2836   Testosterone deficiency 10/18/2012   CARPAL TUNNEL SYNDROME, LEFT 03/12/2010   CERVICAL RADICULOPATHY 03/12/2010   NONSPECIFIC ABNORMAL ELECTROCARDIOGRAM 10/08/2009   ADENOMATOUS COLONIC POLYP 04/06/2008   DIVERTICULOSIS, COLON 04/06/2008   HIATAL HERNIA 12/18/2007   HYPERLIPIDEMIA 10/20/2007   Essential hypertension 05/30/2007   G E R D 05/30/2007   HYPERGLYCEMIA 12/19/2006    Past Surgical History:  Procedure Laterality Date   CARDIOVASCULAR STRESS TEST  08-21-2013  dr Aundra Dubin   normal nuclear study w/ no ischemia/  normal LV function and wall motion , ef 63%   CATARACT EXTRACTION W/ INTRAOCULAR LENS  IMPLANT, BILATERAL  2013   COLONOSCOPY  last one 03-03-2016   CYSTO/  DIRECT VISION INTERNAL URETEROTOMY  11/25/2006   LAPAROSCOPIC CHOLECYSTECTOMY  10/15/2006   MENISCUS REPAIR Right 12/2020   ORBITAL FRACTURE SURGERY  1986   left eye:  Jan 1986--- fixation of fractures, repair of optic nerve decompression , and complex closure   TONSILLECTOMY  child   TRANSTHORACIC ECHOCARDIOGRAM  12-16-2014   dr Aundra Dubin   grade 1 diastolic dysfunction, ef 62-94% (improved from last study in 2014 , previous ef 45-50%) /  trivial MR and TR/  mild LAE/  inferior vena cava dilated, respirophasic diameter changes were blunted (<50%), consistant w/ elevated central venous pressure   TRANSURETHRAL RESECTION OF BLADDER TUMOR Bilateral 05/16/2018   Procedure: TRANSURETHRAL RESECTION OF  BLADDER TUMOR (TURBT)/ RETROGRADE;  Surgeon: Festus Aloe, MD;  Location: [place];  Service: Urology;  Laterality: Bilateral;   TRANSURETHRAL RESECTION OF BLADDER TUMOR WITH GYRUS (TURBT-GYRUS) N/A 11/06/2013   Procedure: TRANSURETHRAL RESECTION OF BLADDER TUMOR WITH GYRUS (TURBT-GYRUS);  Surgeon: Ailene Rud, MD;  Location: [facility];  Service: Urology;  Laterality: N/A;   TRANSURETHRAL RESECTION OF BLADDER TUMOR WITH MITOMYCIN-C N/A 12/09/2016   Procedure: CYSTOSCOPY TRANSURETHRAL RESECTION OF BLADDER TUMOR WITH MITOMYCIN-C;  Surgeon: Carolan Clines, MD;  Location: [place];  Service: Urology;  Laterality: N/A;   VARICOSE VEIN SURGERY  2002   VASECTOMY         Family History  Problem Relation Age of Onset   Heart attack Father 48   Stroke Father 61   Heart attack Paternal Grandfather        in 13s   Prostate cancer Paternal Uncle    Heart attack Paternal Uncle         > 88   Heart attack  Maternal Uncle 80       > 55   Colon cancer Neg Hx    Diabetes Neg Hx     Social History   Tobacco Use   Smoking status: Never   Smokeless tobacco: Never  Vaping Use   Vaping Use: Never used  Substance Use Topics   Alcohol use: Yes    Alcohol/week: 0.0 standard drinks    Comment: occasional   Drug use: No    Home Medications Prior to Admission medications   Medication Sig Start Date End Date Taking? Authorizing Provider  allopurinol (ZYLOPRIM) 300 MG tablet Take 300 mg by mouth daily. 05/28/19   [provider]  anastrozole (ARIMIDEX) 1 MG tablet Take 2 mg by mouth daily. 12/05/20   [provider]  aspirin 81 MG EC tablet Take 1 tablet by mouth daily.    [provider]  atorvastatin (LIPITOR) 40 MG tablet Take 1 tablet (40 mg total) by mouth every evening. 06/09/21   Turner, Eber Hong, MD  bimatoprost (LUMIGAN) 0.01 % SOLN INSTILL ONE DROP IN [facility] EYE AT BEDTIME 07/31/18   [provider]  donepezil  (ARICEPT ODT) 10 MG disintegrating tablet Take 1 tablet (10 mg total) by mouth at bedtime. 08/31/21   Dohmeier, Asencion Partridge, MD  escitalopram (LEXAPRO) 20 MG tablet Take 20 mg by mouth every morning.     [provider]  fenofibrate 160 MG tablet Take 1 tablet (160 mg total) by mouth daily. 06/09/21   Sueanne Margarita, MD  fexofenadine (ALLEGRA) 180 MG tablet Take 1 tablet by mouth as needed. 02/16/21   [provider]  fluconazole (DIFLUCAN) 150 MG tablet Take 1 tablet (150 mg total) by mouth once a week. 06/26/21   Evelina Bucy, DPM  hydrocortisone (ANUSOL-HC) 2.5 % rectal cream Place 1 application rectally 2 (two) times daily. 09/08/21   Irene Shipper, MD  icosapent Ethyl (VASCEPA) 1 g capsule Take 2 capsules (2 g total) by mouth 2 (two) times daily. 06/09/21   Sueanne Margarita, MD  Multiple Vitamins-Minerals (PRESERVISION AREDS 2 PO) Take 1 capsule by mouth 2 (two) times daily.     [provider]  Omega-3 Fatty Acids (FISH OIL) 1000 MG CAPS Take 2 capsules (2,000 mg total) by mouth 2 (two) times daily. 09/05/20   Sueanne Margarita, MD  pantoprazole (PROTONIX) 40 MG tablet TAKE ONE TABLET DAILY 01/09/21   Irene Shipper, MD  testosterone cypionate (DEPOTESTOSTERONE CYPIONATE) 200 MG/ML injection Inject into the muscle. 01/02/21   [provider]  timolol (TIMOPTIC) 0.5 % ophthalmic solution Place 1 drop into the right eye daily. 12/18/20   [provider]    Allergies    Bee pollen, Pollen extract, Sulfamethoxazole, and Sulfamethoxazole-trimethoprim  Review of Systems   Review of Systems  Constitutional:  Negative for chills and fever.  HENT:  Negative for ear pain and sore throat.   Eyes:  Negative for pain and visual disturbance.  Respiratory:  Negative for cough and shortness of breath.   Cardiovascular:  Negative for chest pain and palpitations.  Gastrointestinal:  Positive for abdominal pain and nausea. Negative for anorexia and vomiting.   Genitourinary:  Negative for dysuria and hematuria.  Musculoskeletal:  Negative for arthralgias and back pain.  Skin:  Negative for color change and rash.  Neurological:  Negative for seizures and syncope.  All other systems reviewed and are negative.  Physical Exam Updated Vital Signs BP (!) 161/97   Pulse 61  Temp 98.1 F (36.7 C)   Resp 18   SpO2 96%   Physical Exam Vitals and nursing note reviewed.  Constitutional:      General: He is not in acute distress.    Appearance: He is well-developed.  HENT:     Head: Normocephalic and atraumatic.  Eyes:     Conjunctiva/sclera: Conjunctivae normal.  Cardiovascular:     Rate and Rhythm: Normal rate and regular rhythm.     Heart sounds: No murmur heard. Pulmonary:     Effort: Pulmonary effort is normal. No respiratory distress.     Breath sounds: Normal breath sounds.  Abdominal:     Palpations: Abdomen is soft.     Tenderness: There is abdominal tenderness in the left lower quadrant.  Musculoskeletal:        General: No swelling.     Cervical back: Neck supple.  Skin:    General: Skin is warm and dry.     Capillary Refill: Capillary refill takes less than 2 seconds.  Neurological:     Mental Status: He is alert.  Psychiatric:        Mood and Affect: Mood normal.    ED Results / Procedures / Treatments   Labs (all labs ordered are listed, but only abnormal results are displayed) Labs Reviewed  COMPREHENSIVE METABOLIC PANEL - Abnormal; Notable for the following components:      Result Value   Glucose, Bld 111 (*)    Creatinine, Ser 1.42 (*)    GFR, Estimated 53 (*)    All other components within normal limits  CBC WITH DIFFERENTIAL/PLATELET  URINALYSIS, ROUTINE W REFLEX MICROSCOPIC    EKG None  Radiology CT ABDOMEN PELVIS W CONTRAST  Result Date: 10/21/2021 CLINICAL DATA:  Left lower quadrant pain. EXAM: CT ABDOMEN AND PELVIS WITH CONTRAST TECHNIQUE: Multidetector CT imaging of the abdomen and pelvis was  performed using the standard protocol following bolus administration of intravenous contrast. CONTRAST:  112mL OMNIPAQUE IOHEXOL 350 MG/ML SOLN COMPARISON:  None. FINDINGS: Lower chest: No acute abnormality. Hepatobiliary: There is diffuse fatty infiltration of the liver parenchyma. No focal liver abnormality is seen. Status post cholecystectomy. No biliary dilatation. Pancreas: Unremarkable. No pancreatic ductal dilatation or surrounding inflammatory changes. Spleen: Normal in size without focal abnormality. Adrenals/Urinary Tract: Adrenal glands are unremarkable. Kidneys are normal, without renal calculi, focal lesion, or hydronephrosis. A small left-sided diverticulum is seen along the posterolateral aspect of a moderately distended urinary bladder. Stomach/Bowel: Stomach is within normal limits. The appendix is not identified. No evidence of bowel dilatation. Mildly inflamed diverticula are seen within the proximal sigmoid colon. Vascular/Lymphatic: Aortic atherosclerosis. No enlarged abdominal or pelvic lymph nodes. Reproductive: The prostate gland is mildly enlarged. Other: There is a 1.7 cm x 1.5 cm fat containing umbilical hernia. No abdominopelvic ascites. Musculoskeletal: No acute osseous abnormalities are identified. IMPRESSION: 1. Mild sigmoid diverticulitis. 2. Hepatic steatosis. 3. Small fat containing umbilical hernia. 4. Aortic atherosclerosis. Aortic Atherosclerosis (ICD10-I70.0). Electronically Signed   By: Virgina Norfolk M.D.   On: 10/21/2021 03:33    Procedures Procedures   Medications Ordered in ED Medications  amoxicillin-clavulanate (AUGMENTIN) 875-125 MG per tablet 1 tablet (has no administration in time range)  ondansetron (ZOFRAN-ODT) disintegrating tablet 4 mg (has no administration in time range)  HYDROcodone-acetaminophen (NORCO/VICODIN) 5-325 MG per tablet 1 tablet (1 tablet Oral Given 10/21/21 0004)  iohexol (OMNIPAQUE) 350 MG/ML injection 100 mL (100 mLs Intravenous  Contrast Given 10/21/21 0249)    ED Course  I  have reviewed the triage vital signs and the nursing notes.  Pertinent labs & imaging results that were available during my care of the patient were reviewed by me and considered in my medical decision making (see chart for details).    MDM Rules/Calculators/A&P                           Early [name] is here with lower abdominal pain.  Seen already in triage with lab work and CT imaging done prior to my evaluation.  Imaging positive for acute mild diverticulitis.  No abscess or perforation.  No significant leukocytosis or anemia or electrolyte abnormality.  Very well-appearing.  Will treat with Augmentin and Zofran.  Understands return precautions and discharged in ED in good condition.  This chart was dictated using voice recognition software.  Despite best efforts to proofread,  errors can occur which can change the documentation meaning.   Final Clinical Impression(s) / ED Diagnoses Final diagnoses:  Acute diverticulitis    Rx / DC Orders ED Discharge Orders     None        Lennice Sites, DO 10/21/21 1223

## 2021-10-21 NOTE — ED Provider Notes (Signed)
Emergency Medicine Provider Triage Evaluation Note  Brian Cortez , a 70 y.o. male  was evaluated in triage.  Pt complains of LLQ pain which began this AM around 9AM. Pain persisted through the afternoon and was unrelieved with Gas-X. Subsequently subsided around 1600 with return 4 hours later, more severe and in his suprapubic abdomen. Has had some mild nausea. Diarrhea earlier in the day, improved with Imodium. No melena, hematochezia, vomiting, fevers. Abdominal SHx notable for cholecystectomy. Hx of bladder CA; in remission.  Review of Systems  Positive: As above Negative: As above  Physical Exam  BP (!) 164/97 (BP Location: Left Arm)   Pulse 65   Temp 98.7 F (37.1 C) (Oral)   Resp 18   SpO2 94%  Gen:   Awake, no distress   Resp:  Normal effort  MSK:   Moves extremities without difficulty  Other:  Focal TTP in the LLQ, mild in suprapubic abdomen. Abdomen distended, obese. No peritoneal signs.  Medical Decision Making  Medically screening exam initiated at 12:00 AM.  Appropriate orders placed.  Early [name] was informed that the remainder of the evaluation will be completed by another provider, this initial triage assessment does not replace that evaluation, and the importance of remaining in the ED until their evaluation is complete.  LLQ pain - CT ordered with labs. NPO except for meds. 1 tab of Norco given for pain while pending formal evaluation.   Antonietta Breach, PA-C 10/21/21 0005    Quintella Reichert, MD 10/21/21 [phone]

## 2021-10-23 ENCOUNTER — Other Ambulatory Visit: Payer: Self-pay

## 2021-10-23 ENCOUNTER — Encounter: Payer: PPO | Attending: Psychology

## 2021-10-23 DIAGNOSIS — R413 Other amnesia: Secondary | ICD-10-CM | POA: Insufficient documentation

## 2021-10-23 DIAGNOSIS — F09 Unspecified mental disorder due to known physiological condition: Secondary | ICD-10-CM | POA: Diagnosis not present

## 2021-10-23 NOTE — Progress Notes (Signed)
Behavioral Observation  The patient appeared well-groomed and appropriately dressed. His manners were polite and appropriate to the situation. He initially demonstrated a positive attitude toward testing but became increasingly frustrated throughout the test. He was significantly frustrated by the [employer]bal [employer] subtest of the WMS and refused to complete Part B of the Trail Making Test, scribbling over the testing sheet after making a few errors. Overall, the patient maintained his composure despite high levels of frustration.   Neuropsychology Note  Brian Cortez completed 150 minutes of neuropsychological testing with technician, Dina Rich, BA, under the supervision of Ilean Skill, PsyD., Clinical Neuropsychologist. The patient did not appear overtly distressed by the testing session, per behavioral observation or via self-report to the technician. Rest breaks were offered.   Clinical Decision Making: In considering the patient's current level of functioning, level of presumed impairment, nature of symptoms, emotional and behavioral responses during clinical interview, level of literacy, and observed level of motivation/effort, a battery of tests was selected by Dr. Sima Matas during initial consultation on 10/08/2021. This was communicated to the technician. Communication between the neuropsychologist and technician was ongoing throughout the testing session and changes were made as deemed necessary based on patient performance on testing, technician observations and additional pertinent factors such as those listed above.  Tests Administered: Brian Cortez Executive Functioning System (D-KEFS), Verbal Fluency Test (Standard) Trail Making Test (TMT; Part A & B) Wechsler Adult Intelligence Scale, 4th Edition (WAIS-IV) Wechsler Memory Scale, 4th Edition (WMS-IV); Older Adult Battery    **Trail Making Test (Part A & B) were substituted for the D-KEFS Trail Making Test due to  a lack of D-KEFS response booklets.  Results:   Trail Making Test  Part A Time = 33.80 seconds 0 Errors Part B *did not complete* Time = 2:05.95 6 Errors     WAIS  Composite Score Summary  Scale Sum of Scaled Scores Composite Score Percentile Rank 95% Conf. Interval Qualitative Description  Verbal Comprehension 29 VCI 98 45 92-104 Average  Perceptual Reasoning 22 PRI 84 14 79-91 Low Average  Working Memory 15 WMI 86 18 80-94 Low Average  Processing Speed 21 PSI 102 55 93-110 Average  Full Scale 87 FSIQ 91 27 87-95 Average  General Ability 51 GAI 91 27 86-96 Average      Verbal Comprehension Subtests Summary  Subtest Raw Score Scaled Score Percentile Rank Reference Group Scaled Score SEM  Similarities 21 9 37 8 0.95  Vocabulary 39 11 63 11 0.67  Information 13 9 37 10 0.73  (Comprehension) 26 12 75 11 1.27       Perceptual Reasoning Subtests Summary  Subtest Raw Score Scaled Score Percentile Rank Reference Group Scaled Score SEM  Block Design 24 8 25 6  0.99  Matrix Reasoning 6 6 9 3  0.90  Visual Puzzles 8 8 25 6  0.99  (Picture Completion) 4 5 5 3  0.99       Working Doctor, general practice Raw Score Scaled Score Percentile Rank Reference Group Scaled Score SEM  Digit Span 21 8 25 6  0.73  Arithmetic 9 7 16 6  1.20       Processing Speed Subtests Summary  Subtest Raw Score Scaled Score Percentile Rank Reference Group Scaled Score SEM  Symbol Search 16 7 16 4  1.12  Coding 68 14 91 9 1.12       WMS    Index Score Summary  Index Sum of Scaled Scores Index Score Percentile Rank 95% Confidence Interval  Financial planner Memory (AMI) 35 93 32 87-100 Average  Visual Memory (VMI) 11 76 5 72-82 Borderline  Immediate Memory (IMI) 27 93 32 87-100 Average  Delayed Memory (DMI) 19 77 6 71-86 Borderline      Primary Subtest Scaled Score Summary  Subtest Domain Raw Score Scaled Score Percentile Rank  Logical Memory I AM  35 11 63  Logical Memory II AM 23 12 75  Verbal [employer] I AM 10 6 9   [employer] II AM 3 6 9   Visual Reproduction I VM 29 10 50  Visual Reproduction II VM 0 1 0.1  Symbol Span VWM 15 9 37      Auditory Memory Process Score Summary  Process Score Raw Score Scaled Score Percentile Rank Cumulative Percentage (Base Rate)  LM II Recognition 20 - - >75%  VPA II Recognition 25 - - 17-25%       Visual Memory Process Score Summary  Process Score Raw Score Scaled Score Percentile Rank Cumulative Percentage (Base Rate)  VR II Recognition 6 - - >75%      ABILITY-MEMORY ANALYSIS  Ability Score:  VCI: 98 Date of Testing:  WAIS-IV; WMS-IV 2021/10/23  Predicted Difference Method   Index Predicted WMS-IV Index Score Actual WMS-IV Index Score Difference Critical Value  Significant Difference Y/N Base Rate  Auditory Memory 99 93 6 10.41 N   Visual Memory 99 76 23 7.35 Y 5%  Immediate Memory 99 93 6 9.69 N   Delayed Memory 99 77 22 12.22 Y 5%  Statistical significance (critical value) at the .01 level.      Results from the D-KEFS Verbal Fluency Test (Standard) will be included in final report.  Feedback to Patient: Brian Cortez will return on 11/03/2021 for an interactive feedback session with Dr. Sima Matas at which time his test performances, clinical impressions and treatment recommendations will be reviewed in detail. The patient understands he can contact our office should he require our assistance before this time.  150 minutes spent face-to-face with patient administering standardized tests, 30 minutes spent scoring Environmental education officer). [CPT [id number], [id number]]  Full report to follow.

## 2021-10-24 ENCOUNTER — Encounter: Payer: Self-pay | Admitting: Cardiology

## 2021-10-26 DIAGNOSIS — E291 Testicular hypofunction: Secondary | ICD-10-CM | POA: Diagnosis not present

## 2021-10-27 MED ORDER — ROSUVASTATIN CALCIUM 40 MG PO TABS: 40.0000 mg | ORAL_TABLET | Freq: Every day | ORAL | 3 refills | Status: DC

## 2021-10-29 ENCOUNTER — Encounter: Payer: PPO | Admitting: Psychology

## 2021-11-03 ENCOUNTER — Other Ambulatory Visit: Payer: Self-pay

## 2021-11-03 ENCOUNTER — Encounter: Payer: Self-pay | Admitting: Psychology

## 2021-11-03 ENCOUNTER — Encounter: Payer: PPO | Admitting: Psychology

## 2021-11-03 ENCOUNTER — Encounter (HOSPITAL_BASED_OUTPATIENT_CLINIC_OR_DEPARTMENT_OTHER): Payer: PPO | Admitting: Psychology

## 2021-11-03 DIAGNOSIS — F09 Unspecified mental disorder due to known physiological condition: Secondary | ICD-10-CM

## 2021-11-03 DIAGNOSIS — R413 Other amnesia: Secondary | ICD-10-CM

## 2021-11-03 NOTE — Progress Notes (Signed)
Neuropsychological Evaluation   Patient:  Brian Cortez   DOB: [birth year]  MR Number: [id number]  Location: [facility] PHYSICAL MEDICINE AND REHABILITATION [place], [address] [id number] Dept: [phone]  Start: 8 AM End: 9 AM  Provider/Observer:     Edgardo Roys PsyD  Chief Complaint:      Chief Complaint  Patient presents with   Memory Loss    Reason For Service:      Brian Cortez is a 70 year old male referred by Daymon Larsen, MD for neuropsychological evaluation as part of a larger neurological work-up.  The patient has been followed by Dr. Brett Fairy for many years for obstructive sleep apnea and has been extremely compliant with his CPAP device over the years.  However, he has begun reporting increasing difficulties with his memory with first reports to Dr. Brett Fairy occurring in March of this year.  Recent MoCA cognitive testing has shown a decline from previous assessments with particular concerns about difficulties on serial sevens and following multistep commands.  He did well on other aspects of the MoCA testing with the exception of new trouble with completing the Trail Making Test.  He has been started on Aricept daily.  Recent MRI was unremarkable.  During the clinical interview today, the patient reports that he continues to be very compliant with his CPAP use and feels like the addition of Aricept to his medication regimen has improved his memory functions as much is 50% improvement.  The patient reports that he began noticing some memory changes approximately 3 years ago and describes it as a gradual change over this time rather than stepwise changes with some recent improvement with Aricept.  The patient reports on rare occasions that he will have some difficulties with word finding but this is certainly not every day or once per week and frequency.  He denies any  tremors or any visual hallucinations.  He also denies any geographic disorientation's or other changes in visual-spatial or visual processing changes.  The patient reports that he has had some stressors around a reoccurrence of his bladder cancer.  The patient was treated for bladder cancer and has been followed up and they recently found a reoccurrence, which has been addressed.  He has had various interventions for his bladder cancer including specific targeted chemotherapy, BCG wash etc.  The patient reports that his mood is very positive and denies any symptoms of anxiety or depression.  The patient reports that he started taking Lexapro many years ago to address infrequent but significant panic events.  The patient reports that his perception and worries during these panic events had to do with health concerns as his father died of a heart attack at 44 and the patient's older brother had become anxious and obsessive with fears that his older brother would die at the same age.  His older brother did not pass away at that time.  The patient reports that he would have fears that would be exacerbated by his brother's fears leading to some panic events.  The patient reports that he has had not had any side effects from the Lexapro and feels like it is quite helpful and keeping the panic events at [place].  The patient describes his only significant hospitalizations as having to do with his bladder cancer with 2 previous hospitalizations, a meniscus tear and upcoming right knee replacement planned in May 2023.  He reports that he  is still having some small tumors develop in his bladder, which are being addressed through oncology.  The patient acknowledges some worry with a reoccurrence of these tumors but does not feel like he is being overwhelmed with anxiety regarding them.  The patient reports that he has good sleep patterns with good CPAP compliance, that his appetite is good and there is no specific  changes.  There is no significant history of progressive dementia in the family that the patient is aware of.  His father passed away from a heart attack at 87 years of age and his mother passed away at 54 years of age.  The patient denies any tremors, visual hallucinations or significant changes in geographic orientation.  No other specific symptoms besides his subjective memory changes are noted.  The patient has a 89 year old daughter who has been diagnosed with ADD/OCD and anxiety and has a 30 year old daughter who is in good health with no psychiatric or neurological history.  Tests Administered: Jarome Lamas Executive Functioning System (D-KEFS), Verbal Fluency Test (Standard) Trail Making Test (TMT; Part A & B) Wechsler Adult Intelligence Scale, 4th Edition (WAIS-IV) Wechsler Memory Scale, 4th Edition (WMS-IV); Older Adult Battery    Participation Level:   Active  Participation Quality:  Redirectable      Behavioral Observation:  The patient appeared well-groomed and appropriately dressed. His manners were polite and appropriate to the situation. He initially demonstrated a positive attitude toward testing but became increasingly frustrated throughout the test. He was significantly frustrated by the Verbal Paired Associates subtest of the WMS and refused to complete Part B of the Trail Making Test, scribbling over the testing sheet after making a few errors. Overall, the patient maintained his composure despite high levels of frustration.  Well Groomed, Alert, and  irritable .   Test Results:   Initially, an estimation was made as to the patient's premorbid intellectual and cognitive functioning to be used as comparison point for assessing objective testing results.  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  For comparison point, we will use a conservative estimation of a premorbid intellectual and cognitive functioning around standard score of 120 with the patient estimated to be at least 1 standard deviation above normative populations at least with regard to measures that are sensitive to overall intellectual and cognitive type functions.  Trail Making Test  Part A Time = 33.80 seconds 0 Errors Part B *did not complete* Time = 2:05.95 6 Errors  Patient got very frustrated after making errors and discontinued the test.   D-KEFS Verbal Fluency Tests:  Verbal Fluency Test (Standard) Letter Fluency Total Correct 10            Verbal Fluency Test (Standard) Category Fluency Total Correct 13               Verbal Fluency Test (Standard) Category Switching Total Correct 11             Verbal Fluency Test (Standard) Switching Accuracy 11   During the clinical interview, the patient reports that he is doing well as far as expressive language and receptive language capacity.  He reports only very infrequent difficulties with word finding and fluency and on the objective  assessment of multiple aspects of verbal fluency and word finding the patient performed in the average to high average range relative to a normative population.  He did well on both phonetic fluency as well as category/targeted fluency measures.   WAIS-IV             Composite Score Summary        Scale Sum of Scaled Scores Composite Score Percentile Rank 95% Conf. Interval Qualitative Description  Verbal Comprehension 29 VCI 98 45 92-104 Average  Perceptual Reasoning 22 PRI 84 14 79-91 Low Average  Working Memory 15 WMI 86 18 80-94 Low Average  Processing Speed 21 PSI 102 55 93-110 Average  Full Scale 87 FSIQ 91 27 87-95 Average  General Ability 51 GAI 91 27 86-96 Average    The patient was administered the Wechsler Adult  Intelligence Scale-IV to provide an objective highly standardized well normed assessment of a wide range of intellectual and cognitive functioning domains.  As the patient has been describing changes in memory and other cognitive changes the current score should not be used as a description of his lifelong intellectual and cognitive functioning but rather a estimation of current functioning levels.  The patient produced a full-scale IQ score of 91 which falls in the average range and at the 27th percentile.  This is significantly below predicted levels of premorbid functioning with predicted levels estimated to be around 120 composite score.  This level of performance would suggest multiple areas of change with regard to overall cognitive functioning.             Verbal Comprehension Subtests Summary      Subtest Raw Score Scaled Score Percentile Rank Reference Group Scaled Score SEM  Similarities 21 9 37 8 0.95  Vocabulary 39 11 63 11 0.67  Information 13 9 37 10 0.73  (Comprehension) 26 12 75 11 1.27      The patient produced a verbal comprehension index score of 98 which falls at the 45th percentile and is in the average range.  This is more than 1 standard deviation below the predicted premorbid functioning and does suggest some reduction in verbal based skills.  The patient did well on measures of social judgment and comprehension and performed consistent with predicted levels of premorbid functioning.  He showed mild weaknesses relative to estimated levels of function with regard to verbal reasoning and problem-solving in general fund of information and his overall vocabulary was generally consistent with premorbid estimates.             Perceptual Reasoning Subtests Summary      Subtest Raw Score Scaled Score Percentile Rank Reference Group Scaled Score SEM  Block Design 24 8 25 6  0.99  Matrix Reasoning 6 6 9 3  0.90  Visual Puzzles 8 8 25 6  0.99  (Picture Completion) 4 5 5 3  0.99       The patient produced a perceptual reasoning index score of 84 which falls at the 14th percentile and is in the low average range relative to a normative population.  This is significantly below predicted levels and suggest significant change from premorbid functioning levels in multiple areas of visual spatial and visual reasoning/problem solving areas.  The patient showed mild to moderate deficits on measures of visual analysis and organization and visual estimates and judgment capacity.  Significant decreases in visual reasoning and problem-solving as well as significant issues with his ability to identify visual anomalies within a visual gestalt.  Working Electrical engineer Raw Score Scaled Score Percentile Rank Reference Group Scaled Score SEM  Digit Span 21 8 25 6  0.73  Arithmetic 9 7 16 6  1.20      The patient produced a working memory index score of 86 which falls at the 18th percentile and is in the low average range relative to a normative population.  This is significantly below predicted levels of premorbid functioning especially giving his history of very good functioning with regard to mathematics including both his education occupational history.  The patient showed significant weakness with regard to auditory encoding and his ability to process information that are in active auditory registers.             Processing Speed Subtests Summary     Subtest Raw Score Scaled Score Percentile Rank Reference Group Scaled Score SEM  Symbol Search 16 7 16 4  1.12  Coding 68 14 91 9 1.12    The patient produced a processing speed index score of 102 which is at the 55th percentile and in the average range relative to normative population.  There was significant subtest scatter.  The patient showed significant deficits on the symbol search subtest where he produced a scaled score of 7 following at the 16th percentile.  He did very well on the coding subtest and  performed at the 91st percentile.  This pattern of scatter would suggest that his difficulties primarily had to do with visual processing and active visual shifting of attention versus reduction in overall speed of mental operations and visual scanning and visual searching components.       WMS             Index Score Summary       Index Sum of Scaled Scores Index Score Percentile Rank 95% Confidence Interval Qualitative Descriptor  Auditory Memory (AMI) 35 93 32 87-100 Average  Visual Memory (VMI) 11 76 5 72-82 Borderline  Immediate Memory (IMI) 27 93 32 87-100 Average  Delayed Memory (DMI) 19 77 6 71-86 Borderline    The patient was then administered the Wechsler Memory Scale-IV to provide a structured well normed measure of a wide range of memory and learning functions.  Given the patient's history he likely performed in the high average to superior range in this domain historically.  Breaking the patient's memory functions down between auditory versus visual memory the patient produced an auditory memory index score of 93 which falls at the 32nd percentile and is in the average range.  While this is an average range of functioning it is below estimates of historic/premorbid functioning levels.  The patient actually did fairly well on story learning but had significant difficulties on verbal paired Associates auditory learning measure and became very frustrated during this task.  I suspect, the level of distractibility and this measure played a role in his greater difficulty on 1 auditory memory subtest with good performance on another auditory memory subtest.  The patient had significant difficulties for visual memory as he produced a visual memory index score of 76 following at the 5th percentile.  This is significantly below predicted levels of premorbid functioning.  Looking at individual subtest, the patient actually did fairly well initially learning visual information but showed  significant loss/deterioration of information after a delay.  However, his recognition performance was quite good suggesting that the larger deficit had to do with difficulties with retrieval and likely frustration around these task rather than  an inability to effectively store and organize new information.  Break in memory functions down between immediate versus delayed memory, the observation above of greater difficulty for free recall after period of delay holds up as the patient showed average immediate memory and learning functions with 1 subtest bringing that down significantly (verbal paired Associates) and on the delayed memory being almost completely impacted on 1 subtest where he had no free recall of visual information.  Under recognition and cued recall formats, the patient showed significant improvement performing quite well under recognition formats for information that he actually was unable to free recall any information after period of delay but had very good recall under cued/recognition format.            Primary Subtest Scaled Score Summary     Subtest Domain Raw Score Scaled Score Percentile Rank  Logical Memory I AM 35 11 63  Logical Memory II AM 23 12 75  Verbal Paired Associates I AM 10 6 9   Verbal Paired Associates II AM 3 6 9   Visual Reproduction I VM 29 10 50  Visual Reproduction II VM 0 1 0.1  Symbol Span VWM 15 9 37                Auditory Memory Process Score Summary      Process Score Raw Score Scaled Score Percentile Rank Cumulative Percentage (Base Rate)  LM II Recognition 20 - - >75%  VPA II Recognition 25 - - 17-25%                   Visual Memory Process Score Summary      Process Score Raw Score Scaled Score Percentile Rank Cumulative Percentage (Base Rate)  VR II Recognition 6 - - >75%       Summary of Results:   Overall, the results of the current neuropsychological evaluation do suggest that the patient is having some global reduction in  overall cognitive and intellectual functioning.  The patient is displaying some mild relative difficulties with regard to verbal reasoning and problem-solving ability and recalling general knowledge type information.  The patient is showing greater difficulties on visual spatial and visual reasoning and visual constructional type issues.  There is a reduction in auditory encoding capacity versus visual encoding capacity with greater weaknesses for auditory encoding.  This impacts some of his information processing speed subtest score 1 measure of focus execute/information processing speed was quite good he had very significant difficulties on another similar measure that required more visual spatial processing type component.  This combined with the results of the Trail Making Test do suggest that visual shifting, visual-spatial processing are the primary culprit without a significant reduction in information processing speed.  While visual memory showed significant deficits on the current testing careful analysis of individual subtest suggest that most of this deficit was within 1 individual subtest accounting for the degree and level of impairment found with greater deficits for visual memory versus auditory memory but both significantly below predicted levels.  The patient showed the ability to initially store and organize auditory information with the exception of paired auditory information and on delayed recall measure was unable to recall any of the information on 1 visual subtest.  But under recognition/cued recall the patient showed significant improvement and did quite well on all recognition/cued recall measures.  The 1 exception to this was for the 1 individual auditory memory subtest (verbal paired Associates) that he had become very frustrated with and  did much more poorly than story based contextual learning.  Impression/Diagnosis:   Overall, the results of the current neuropsychological evaluation  do suggest that there is a measurable observed decline in overall/global intellectual and cognitive functioning consistent with the patient's subjective reports.  The patient reports that his expressive language functions remain generally intact with only episodic difficulties.  He did quite well on measures of verbal fluency and did well on measures of information processing speed in general.  The patient did, however, showed numerous issues of significant frustration and difficulties on some individual subtest and I believe that some of this level of frustration because scores that were significantly below is likely current capacity.  The patient had difficulty with shifting and adapting to changing situation and when he was faced with challenging subtest his frustration caused greater difficulties then he likely would have had if he had not become so frustrated.  The patient showed greater memory deficits with regard to visual versus auditory memory and learning but showed significant improvements in both of these aspects with the exception of 1 individual subtest under cueing and light/recognition formats.  In general, the most consistent and stable finding was 1 of impaired visual spatial, visual reasoning and visual processing of information with a general decline of executive functioning around shifting and poor frustration tolerance.  His memory significantly improved under recognition formats.  As far as diagnostic considerations, his test performance does present a generally challenging presentation.  The patient is showing a mild to moderate decrease in overall cognitive functioning relative to estimations of his historic/premorbid functioning levels.  He is showing significant and consistent deficits around visual processing and visual spatial/visual reasoning measures as well as difficulties and reductions in performance relative to his premorbid estimates for memory and learning.  However,  frustration with various individual subtest tended to skew results and his memory function significantly improved under recognition/cued recall suggesting that frustration and agitation is playing some degree in worsening of his real-life experiences of cognitive change.  At this point, I cannot rule out the possibility of a progressive cortical dementia such as Alzheimer's dementia but we will absolutely need repeat testing to be more confident in either that diagnosis or ruling out that diagnosis.  The patient does not show patterns consistent with Lewy body and his MRI as well as patterns of cognitive strengths and weaknesses would not be consistent with significant microvascular ischemic disease or small vessel disease being the culprit.  The patient reports that he is quite compliant with his CPAP device and reports that his sleep patterns are good and denies any significant depression or anxiety.  He does have a history of prior panic attacks.  The patient reports that he is responding quite well to his SSRI medications and denies any depression or anxiety symptomatology.  At this point, he would meet that diagnostic criterion for mild cognitive disorder/mild cognitive decline and we will need repeat testing in approximately 9 to 12 months to be more definitive in diagnostic considerations.  As far as recommendations, the patient reports that he is responding well to Aricept as well as his other psychotropic medications and these clearly should be continued as he denies any significant side effects with any of them.  I will also talk to the patient about his level of frustration and working on strategies to not overly complicate his day-to-day cognitive functioning with hypervigilance over any mistakes or frustrations whenever possible.  I do think that his growing frustration is likely having  a compounding deleterious effect on his day-to-day cognitive functioning and better coping and less self observation  may improve his subjective experiences as well.  He should also continue to monitor his cardiovascular status paying close attention to blood pressure and other issues such as cholesterol etc. good dietary habits, regular sustained physical activity, maintaining active cognitive activities and continuing to maintain good sleep patterns and sleep hygiene will be important.  Diagnosis:    Mild cognitive disorder  Memory deficits   _____________________ Ilean Skill, Psy.D. Clinical Neuropsychologist

## 2021-11-03 NOTE — Progress Notes (Signed)
11/03/2021 9 AM-10 AM:  Today's visit was an in person visit was conducted in my outpatient clinic office.  Today I provided feedback regarding the results of the recent neuropsychological evaluation that can be found in his EMR dated 11/03/2021 at 8 AM.  The patient and his wife are present along with myself for this feedback session.  I will include the summary and diagnostic information below for convenience in the entire evaluation can be found on 11/03/2021.  We reviewed the results of the recent neuropsychological evaluation and the need for repeat testing in approximately 9 months for more confident diagnostic considerations.    Impression/Diagnosis:                     Overall, the results of the current neuropsychological evaluation do suggest that there is a measurable observed decline in overall/global intellectual and cognitive functioning consistent with the patient's subjective reports.  The patient reports that his expressive language functions remain generally intact with only episodic difficulties.  He did quite well on measures of verbal fluency and did well on measures of information processing speed in general.  The patient did, however, showed numerous issues of significant frustration and difficulties on some individual subtest and I believe that some of this level of frustration because scores that were significantly below is likely current capacity.  The patient had difficulty with shifting and adapting to changing situation and when he was faced with challenging subtest his frustration caused greater difficulties then he likely would have had if he had not become so frustrated.  The patient showed greater memory deficits with regard to visual versus auditory memory and learning but showed significant improvements in both of these aspects with the exception of 1 individual subtest under cueing and light/recognition formats.  In general, the most consistent and stable finding was 1 of  impaired visual spatial, visual reasoning and visual processing of information with a general decline of executive functioning around shifting and poor frustration tolerance.  His memory significantly improved under recognition formats.  As far as diagnostic considerations, his test performance does present a generally challenging presentation.  The patient is showing a mild to moderate decrease in overall cognitive functioning relative to estimations of his historic/premorbid functioning levels.  He is showing significant and consistent deficits around visual processing and visual spatial/visual reasoning measures as well as difficulties and reductions in performance relative to his premorbid estimates for memory and learning.  However, frustration with various individual subtest tended to skew results and his memory function significantly improved under recognition/cued recall suggesting that frustration and agitation is playing some degree in worsening of his real-life experiences of cognitive change.  At this point, I cannot rule out the possibility of a progressive cortical dementia such as Alzheimer's dementia but we will absolutely need repeat testing to be more confident in either that diagnosis or ruling out that diagnosis.  The patient does not show patterns consistent with Lewy body and his MRI as well as patterns of cognitive strengths and weaknesses would not be consistent with significant microvascular ischemic disease or small vessel disease being the culprit.  The patient reports that he is quite compliant with his CPAP device and reports that his sleep patterns are good and denies any significant depression or anxiety.  He does have a history of prior panic attacks.  The patient reports that he is responding quite well to his SSRI medications and denies any depression or anxiety symptomatology.  At this point,  he would meet that diagnostic criterion for mild cognitive disorder/mild cognitive  decline and we will need repeat testing in approximately 9 to 12 months to be more definitive in diagnostic considerations.  As far as recommendations, the patient reports that he is responding well to Aricept as well as his other psychotropic medications and these clearly should be continued as he denies any significant side effects with any of them.  I will also talk to the patient about his level of frustration and working on strategies to not overly complicate his day-to-day cognitive functioning with hypervigilance over any mistakes or frustrations whenever possible.  I do think that his growing frustration is likely having a compounding deleterious effect on his day-to-day cognitive functioning and better coping and less self observation may improve his subjective experiences as well.  He should also continue to monitor his cardiovascular status paying close attention to blood pressure and other issues such as cholesterol etc. good dietary habits, regular sustained physical activity, maintaining active cognitive activities and continuing to maintain good sleep patterns and sleep hygiene will be important.   Diagnosis:                                Mild cognitive disorder   Memory deficits     _____________________ Ilean Skill, Psy.D. Clinical Neuropsychologist

## 2021-11-03 NOTE — Progress Notes (Deleted)
   Neuropsychology Note  Brian Cortez completed 150 minutes of neuropsychological testing on 10/23/2021 with technician, Brian Cortez, BA, under the supervision of Ilean Skill, PsyD., Clinical Neuropsychologist. The patient did not appear overtly distressed by the testing session, per behavioral observation or via self-report to the technician. Rest breaks were offered.   Clinical Decision Making: In considering the patient's current level of functioning, level of presumed impairment, nature of symptoms, emotional and behavioral responses during clinical interview, level of literacy, and observed level of motivation/effort, a battery of tests was selected by Brian Cortez during initial consultation on 10/08/2021. This was communicated to the technician. Communication between the neuropsychologist and technician was ongoing throughout the testing session and changes were made as deemed necessary based on patient performance on testing, technician observations and additional pertinent factors such as those listed above.  Tests Administered: Brian Cortez Executive Functioning System (D-KEFS), Verbal Fluency  Results:  Verbal Fluency Test (Standard) Letter Fluency Total Correct 10            Verbal Fluency Test (Standard) Category Fluency Total Correct 13               Verbal Fluency Test (Standard) Category Switching Total Correct 11             Verbal Fluency Test (Standard) Switching Accuracy 11   Feedback to Patient: Brian Cortez will return for an interactive feedback session with Brian Cortez at which time his test performances, clinical impressions and treatment recommendations will be reviewed in detail. The patient understands he can contact our office should he require our assistance before this time.  150 minutes spent face-to-face with patient administering standardized tests, 30 minutes spent scoring Environmental education officer). [CPT [id number], [id number]]  Full report to follow.

## 2021-11-05 DIAGNOSIS — I83891 Varicose veins of right lower extremities with other complications: Secondary | ICD-10-CM | POA: Diagnosis not present

## 2021-11-05 DIAGNOSIS — I83811 Varicose veins of right lower extremities with pain: Secondary | ICD-10-CM | POA: Diagnosis not present

## 2021-11-08 DIAGNOSIS — G4733 Obstructive sleep apnea (adult) (pediatric): Secondary | ICD-10-CM | POA: Diagnosis not present

## 2021-11-11 DIAGNOSIS — E291 Testicular hypofunction: Secondary | ICD-10-CM | POA: Diagnosis not present

## 2021-11-18 DIAGNOSIS — Z09 Encounter for follow-up examination after completed treatment for conditions other than malignant neoplasm: Secondary | ICD-10-CM | POA: Diagnosis not present

## 2021-11-18 DIAGNOSIS — I83892 Varicose veins of left lower extremities with other complications: Secondary | ICD-10-CM | POA: Diagnosis not present

## 2021-11-18 DIAGNOSIS — I87391 Chronic venous hypertension (idiopathic) with other complications of right lower extremity: Secondary | ICD-10-CM | POA: Diagnosis not present

## 2021-11-18 DIAGNOSIS — E291 Testicular hypofunction: Secondary | ICD-10-CM | POA: Diagnosis not present

## 2021-11-25 DIAGNOSIS — E291 Testicular hypofunction: Secondary | ICD-10-CM | POA: Diagnosis not present

## 2021-12-02 DIAGNOSIS — I83891 Varicose veins of right lower extremities with other complications: Secondary | ICD-10-CM | POA: Diagnosis not present

## 2021-12-02 DIAGNOSIS — M79604 Pain in right leg: Secondary | ICD-10-CM | POA: Diagnosis not present

## 2021-12-02 DIAGNOSIS — I87391 Chronic venous hypertension (idiopathic) with other complications of right lower extremity: Secondary | ICD-10-CM | POA: Diagnosis not present

## 2021-12-03 DIAGNOSIS — C672 Malignant neoplasm of lateral wall of bladder: Secondary | ICD-10-CM | POA: Diagnosis not present

## 2021-12-04 ENCOUNTER — Encounter: Payer: Self-pay | Admitting: Neurology

## 2021-12-09 DIAGNOSIS — E291 Testicular hypofunction: Secondary | ICD-10-CM | POA: Diagnosis not present

## 2021-12-11 ENCOUNTER — Ambulatory Visit: Payer: PPO | Admitting: Physical Medicine & Rehabilitation

## 2021-12-14 DIAGNOSIS — M1711 Unilateral primary osteoarthritis, right knee: Secondary | ICD-10-CM | POA: Diagnosis not present

## 2021-12-23 DIAGNOSIS — E291 Testicular hypofunction: Secondary | ICD-10-CM | POA: Diagnosis not present

## 2021-12-25 DIAGNOSIS — H401132 Primary open-angle glaucoma, bilateral, moderate stage: Secondary | ICD-10-CM | POA: Diagnosis not present

## 2022-01-01 DIAGNOSIS — E669 Obesity, unspecified: Secondary | ICD-10-CM | POA: Diagnosis not present

## 2022-01-01 DIAGNOSIS — I2584 Coronary atherosclerosis due to calcified coronary lesion: Secondary | ICD-10-CM | POA: Diagnosis not present

## 2022-01-01 DIAGNOSIS — E785 Hyperlipidemia, unspecified: Secondary | ICD-10-CM | POA: Diagnosis not present

## 2022-01-01 DIAGNOSIS — N1832 Chronic kidney disease, stage 3b: Secondary | ICD-10-CM | POA: Diagnosis not present

## 2022-01-01 DIAGNOSIS — R7301 Impaired fasting glucose: Secondary | ICD-10-CM | POA: Diagnosis not present

## 2022-01-01 DIAGNOSIS — M25561 Pain in right knee: Secondary | ICD-10-CM | POA: Diagnosis not present

## 2022-01-01 DIAGNOSIS — C679 Malignant neoplasm of bladder, unspecified: Secondary | ICD-10-CM | POA: Diagnosis not present

## 2022-01-01 DIAGNOSIS — M109 Gout, unspecified: Secondary | ICD-10-CM | POA: Diagnosis not present

## 2022-01-01 DIAGNOSIS — G3184 Mild cognitive impairment, so stated: Secondary | ICD-10-CM | POA: Diagnosis not present

## 2022-01-01 DIAGNOSIS — E291 Testicular hypofunction: Secondary | ICD-10-CM | POA: Diagnosis not present

## 2022-01-01 DIAGNOSIS — F419 Anxiety disorder, unspecified: Secondary | ICD-10-CM | POA: Diagnosis not present

## 2022-01-01 DIAGNOSIS — I129 Hypertensive chronic kidney disease with stage 1 through stage 4 chronic kidney disease, or unspecified chronic kidney disease: Secondary | ICD-10-CM | POA: Diagnosis not present

## 2022-01-02 ENCOUNTER — Other Ambulatory Visit: Payer: Self-pay | Admitting: Internal Medicine

## 2022-01-06 ENCOUNTER — Telehealth: Payer: Self-pay

## 2022-01-06 DIAGNOSIS — E291 Testicular hypofunction: Secondary | ICD-10-CM | POA: Diagnosis not present

## 2022-01-06 NOTE — Telephone Encounter (Signed)
**Note De-Identified  Obfuscation** Vascepa PA started through covermymeds and approved as follows:  CRUZE ZINGARO (Key: [id number]) Rx #: [id number] Vascepa 1GM capsules Form: [place] [employer] Electronic Prior Authorization Form 2017 Determination: Favorable Prior authorization for Vascepa has been approved.  Message from plan: 13-FEB-23:31-DEC-23 Vascepa 1GM OR CAPS Quantity:360;  I have notified [place], [place] - [address] of this approval.

## 2022-01-14 DIAGNOSIS — H401132 Primary open-angle glaucoma, bilateral, moderate stage: Secondary | ICD-10-CM | POA: Diagnosis not present

## 2022-01-20 DIAGNOSIS — E291 Testicular hypofunction: Secondary | ICD-10-CM | POA: Diagnosis not present

## 2022-02-15 DIAGNOSIS — E291 Testicular hypofunction: Secondary | ICD-10-CM | POA: Diagnosis not present

## 2022-02-19 DIAGNOSIS — N1832 Chronic kidney disease, stage 3b: Secondary | ICD-10-CM | POA: Diagnosis not present

## 2022-02-19 DIAGNOSIS — E785 Hyperlipidemia, unspecified: Secondary | ICD-10-CM | POA: Diagnosis not present

## 2022-02-19 DIAGNOSIS — I10 Essential (primary) hypertension: Secondary | ICD-10-CM | POA: Diagnosis not present

## 2022-02-19 DIAGNOSIS — K219 Gastro-esophageal reflux disease without esophagitis: Secondary | ICD-10-CM | POA: Diagnosis not present

## 2022-02-26 ENCOUNTER — Telehealth: Payer: Self-pay | Admitting: *Deleted

## 2022-02-26 NOTE — Telephone Encounter (Signed)
? ?  Patient Name: Brian Cortez  ?DOB: [birth year] ?MRN: [id number] ? ?Primary Cardiologist: Fransico Him, MD ? ?Chart reviewed as part of pre-operative protocol coverage.  ? ?71 year old male with a history of dilated cardiomyopathy, prior EF 45 to 50% with mild global hypokinesis, improved to 60 to 65% on echo in 2019, coronary artery calcification on CT chest with normal nuclear stress test (Cardiolite showed normal EF), hypertension, hyperlipidemia, and bladder cancer s/p resection and BCG treatment. ? ?He was last seen in the office on 06/09/2021 and was stable from a cardiac standpoint.  He denied symptoms concerning for angina.  Follow-up was recommended in 1 year. ? ?We received a surgical clearance request for right total knee arthroplasty scheduled for 03/15/2022.  Dr. Radford Pax, please advise on holding aspirin prior to this procedure. ? ?Please route your response to P CV DIV PREOP.  ? ?Thank you.  ? ?Lenna Sciara, NP ?02/26/2022, 3:30 PM ? ?

## 2022-02-26 NOTE — Telephone Encounter (Signed)
? ?  Pre-operative Risk Assessment  ?  ?Patient Name: Brian Cortez  ?DOB: Nov 16, 1951 ?MRN: [id number]  ? ?  ? ?Request for Surgical Clearance   ? ?Procedure:   RIGHT TOTAL KNEE ARTHROPLASTY ? ?Date of Surgery:  Clearance 03/15/22                              ?   ?Surgeon:  DR. Mardi Mainland BOLOGNESI ?Surgeon's Group or Practice Name:  [facility] ?Phone number:  [phone] ?Fax number:  [phone] ?  ?Type of Clearance Requested:   ?- Medical  ?- Pharmacy:  Hold Aspirin   ?  ?Type of Anesthesia:  Not Indicated; LEFT MESSAGE TO CALL BACK AND CLARIFY IF ANY ANESTHESIA WILL BE USED; CLEARANCE FORM ONLY STATES THAT THEY WILL NOT BE USING GENERAL ANESTHESIA ?  ?Additional requests/questions:   ? ?Signed, ?Julaine Hua   ?02/26/2022, 9:08 AM  ? ?

## 2022-02-26 NOTE — Telephone Encounter (Signed)
Nicole Kindred from [facility] states the patient could have several different anesthesia depending on how he does during surgery. He states they will for sure numb the region for his knee, and they may use deep sedation or it could escalate. ?

## 2022-03-01 ENCOUNTER — Telehealth: Payer: Self-pay | Admitting: *Deleted

## 2022-03-01 ENCOUNTER — Ambulatory Visit: Payer: PPO | Admitting: Neurology

## 2022-03-01 DIAGNOSIS — B0229 Other postherpetic nervous system involvement: Secondary | ICD-10-CM | POA: Diagnosis not present

## 2022-03-01 DIAGNOSIS — N1832 Chronic kidney disease, stage 3b: Secondary | ICD-10-CM | POA: Diagnosis not present

## 2022-03-01 DIAGNOSIS — I2510 Atherosclerotic heart disease of native coronary artery without angina pectoris: Secondary | ICD-10-CM | POA: Diagnosis not present

## 2022-03-01 DIAGNOSIS — J189 Pneumonia, unspecified organism: Secondary | ICD-10-CM | POA: Diagnosis not present

## 2022-03-01 DIAGNOSIS — J310 Chronic rhinitis: Secondary | ICD-10-CM | POA: Diagnosis not present

## 2022-03-01 DIAGNOSIS — E782 Mixed hyperlipidemia: Secondary | ICD-10-CM | POA: Diagnosis not present

## 2022-03-01 DIAGNOSIS — D090 Carcinoma in situ of bladder: Secondary | ICD-10-CM | POA: Diagnosis not present

## 2022-03-01 DIAGNOSIS — H409 Unspecified glaucoma: Secondary | ICD-10-CM | POA: Diagnosis not present

## 2022-03-01 DIAGNOSIS — R9431 Abnormal electrocardiogram [ECG] [EKG]: Secondary | ICD-10-CM | POA: Diagnosis not present

## 2022-03-01 DIAGNOSIS — G4733 Obstructive sleep apnea (adult) (pediatric): Secondary | ICD-10-CM | POA: Diagnosis not present

## 2022-03-01 DIAGNOSIS — J301 Allergic rhinitis due to pollen: Secondary | ICD-10-CM | POA: Diagnosis not present

## 2022-03-01 DIAGNOSIS — R7301 Impaired fasting glucose: Secondary | ICD-10-CM | POA: Diagnosis not present

## 2022-03-01 DIAGNOSIS — J9811 Atelectasis: Secondary | ICD-10-CM | POA: Diagnosis not present

## 2022-03-01 DIAGNOSIS — R634 Abnormal weight loss: Secondary | ICD-10-CM | POA: Diagnosis not present

## 2022-03-01 DIAGNOSIS — E669 Obesity, unspecified: Secondary | ICD-10-CM | POA: Diagnosis not present

## 2022-03-01 DIAGNOSIS — R0981 Nasal congestion: Secondary | ICD-10-CM | POA: Diagnosis not present

## 2022-03-01 DIAGNOSIS — K449 Diaphragmatic hernia without obstruction or gangrene: Secondary | ICD-10-CM | POA: Diagnosis not present

## 2022-03-01 DIAGNOSIS — R530 Neoplastic (malignant) related fatigue: Secondary | ICD-10-CM | POA: Diagnosis not present

## 2022-03-01 DIAGNOSIS — F09 Unspecified mental disorder due to known physiological condition: Secondary | ICD-10-CM | POA: Diagnosis not present

## 2022-03-01 DIAGNOSIS — Z01818 Encounter for other preprocedural examination: Secondary | ICD-10-CM | POA: Diagnosis not present

## 2022-03-01 DIAGNOSIS — I429 Cardiomyopathy, unspecified: Secondary | ICD-10-CM | POA: Diagnosis not present

## 2022-03-01 DIAGNOSIS — I10 Essential (primary) hypertension: Secondary | ICD-10-CM | POA: Diagnosis not present

## 2022-03-01 DIAGNOSIS — M1711 Unilateral primary osteoarthritis, right knee: Secondary | ICD-10-CM | POA: Diagnosis not present

## 2022-03-01 DIAGNOSIS — I5189 Other ill-defined heart diseases: Secondary | ICD-10-CM | POA: Diagnosis not present

## 2022-03-01 DIAGNOSIS — N35914 Unspecified anterior urethral stricture, male: Secondary | ICD-10-CM | POA: Diagnosis not present

## 2022-03-01 NOTE — Telephone Encounter (Signed)
Primary Cardiologist:Traci Radford Pax, MD ? ?Chart reviewed as part of pre-operative protocol coverage. Because of SAMY RYNER past medical history and time since last visit, he/she will require a virtual visit/telephone call in order to better assess preoperative cardiovascular risk. ? ?Pre-op covering staff: ?- Please contact patient, obtain consent, and schedule appointment  ? ?Per Dr. Radford Pax, patient may hold aspirin prior to procedure. ? ?Emmaline Life, NP-C ? ?  ?03/01/2022, 8:17 AM ?[facility] ?[address], [address] ?Office [phone] Fax [phone] ? ?

## 2022-03-01 NOTE — Telephone Encounter (Signed)
Pt has been scheduled for a telephone visit tomorrow, 03/02/22. ?Will have to call back to go over medications. ?

## 2022-03-02 ENCOUNTER — Encounter: Payer: Self-pay | Admitting: Nurse Practitioner

## 2022-03-02 ENCOUNTER — Telehealth: Payer: Self-pay | Admitting: *Deleted

## 2022-03-02 ENCOUNTER — Ambulatory Visit (INDEPENDENT_AMBULATORY_CARE_PROVIDER_SITE_OTHER): Payer: PPO | Admitting: Nurse Practitioner

## 2022-03-02 DIAGNOSIS — Z01810 Encounter for preprocedural cardiovascular examination: Secondary | ICD-10-CM

## 2022-03-02 NOTE — Progress Notes (Signed)
? ?Virtual Visit via Telephone Note  ? ?This visit type was conducted due to national recommendations for restrictions regarding the COVID-19 Pandemic (e.g. social distancing) in an effort to limit this patient's exposure and mitigate transmission in our community.  Due to his co-morbid illnesses, this patient is at least at moderate risk for complications without adequate follow up.  This format is felt to be most appropriate for this patient at this time.  The patient did not have access to video technology/had technical difficulties with video requiring transitioning to audio format only (telephone).  All issues noted in this document were discussed and addressed.  No physical exam could be performed with this format.  Please refer to the patient's chart for his  consent to telehealth for [facility]. ? ?Evaluation Performed:  Preoperative cardiovascular risk assessment ?_____________  ? ?Date:  03/02/2022  ? ?Patient ID:  Brian, Cortez 01/20/1951, MRN [id number] ?Patient Location:  ?Home ?Provider location:   ?Office ? ?Primary Care Provider:  Prince Solian, MD ?Primary Cardiologist:  Fransico Him, MD ? ?Chief Complaint  ?  ?71 y.o. y/o male with a h/o dilated cardiomyopathy, prior EF 45 to 50% (2014) with mild global hypokinesis, improved to 60 to 65% on echo in 2019, coronary artery calcification on CT chest with normal nuclear stress test, hypertension, hyperlipidemia who is pending Right total knee arthroplasty, and presents today for telephonic preoperative cardiovascular risk assessment. ? ?Past Medical History  ?  ?Past Medical History:  ?Diagnosis Date  ? Anxiety   ? Bladder tumor   ? Carpal tunnel syndrome, left   ? intermittant  ? Chronic seasonal allergic rhinitis   ? Coronary artery disease   ? per Dr Aundra Dubin (cardiologist) note --- noted on CT chest coronary calcification -- ETT-Cardiolite normal in 2014  ? DDD (degenerative disc disease), cervical   ? Dilated cardiomyopathy ([place])   ?  Diverticulosis of colon   ? Fecal incontinence   ? GERD (gastroesophageal reflux disease)   ? Grade II diastolic dysfunction 62/22/9798  ? per ECHO   ? Hemorrhoids   ? Hiatal hernia   ? History of adenomatous polyp of colon   ? 2001;  2007;  2012  ? History of bladder cancer urologist-  dr Gaynelle Arabian  ? 11-06-2013  s/p TURBT and BCG tx's  ? History of kidney stones   ? about 45 years ago  ? History of panic attacks   ? History of urethral stricture   ? Hyperlipidemia   ? Hypertension   ? Hypogonadism in male   ? LVH (left ventricular hypertrophy) 06/17/2017  ? Mild, noted on ECHO  ? Nonischemic cardiomyopathy ([place])   ? cardiologist-  dr Aundra Dubin-- per last echo 01/ 2016 ef 50-55%  (up from 09/ 2014 ef 45-50%)  ? OSA on CPAP   ? since 2014  ? Pneumonia 11/2010  ? Left lower lobe  ? Pulmonary hypertension ([place]) 06/17/2017  ? Mild, per ECHO  ? ?Past Surgical History:  ?Procedure Laterality Date  ? CARDIOVASCULAR STRESS TEST  08-21-2013  dr Aundra Dubin  ? normal nuclear study w/ no ischemia/  normal LV function and wall motion , ef 63%  ? CATARACT EXTRACTION W/ INTRAOCULAR LENS  IMPLANT, BILATERAL  2013  ? COLONOSCOPY  last one 03-03-2016  ? CYSTO/  DIRECT VISION INTERNAL URETEROTOMY  11/25/2006  ? LAPAROSCOPIC CHOLECYSTECTOMY  10/15/2006  ? MENISCUS REPAIR Right 12/2020  ? [place]  ? left eye:  Jan 1986---  fixation of fractures, repair of optic nerve decompression , and complex closure  ? TONSILLECTOMY  child  ? TRANSTHORACIC ECHOCARDIOGRAM  12-16-2014   dr Aundra Dubin  ? grade 1 diastolic dysfunction, ef 07-37% (improved from last study in 2014 , previous ef 45-50%) /  trivial MR and TR/  mild LAE/  inferior vena cava dilated, respirophasic diameter changes were blunted (<50%), consistant w/ elevated central venous pressure  ? TRANSURETHRAL RESECTION OF BLADDER TUMOR Bilateral 05/16/2018  ? Procedure: TRANSURETHRAL RESECTION OF BLADDER TUMOR (TURBT)/ RETROGRADE;  Surgeon: Festus Aloe, MD;  Location:  [facility];  Service: Urology;  Laterality: Bilateral;  ? TRANSURETHRAL RESECTION OF BLADDER TUMOR WITH GYRUS (TURBT-GYRUS) N/A 11/06/2013  ? Procedure: TRANSURETHRAL RESECTION OF BLADDER TUMOR WITH GYRUS (TURBT-GYRUS);  Surgeon: Ailene Rud, MD;  Location: [facility];  Service: Urology;  Laterality: N/A;  ? TRANSURETHRAL RESECTION OF BLADDER TUMOR WITH MITOMYCIN-C N/A 12/09/2016  ? Procedure: CYSTOSCOPY TRANSURETHRAL RESECTION OF BLADDER TUMOR WITH MITOMYCIN-C;  Surgeon: Carolan Clines, MD;  Location: [facility];  Service: Urology;  Laterality: N/A;  ? VARICOSE VEIN SURGERY  2002  ? VASECTOMY    ? ? ?Allergies ? ?Allergies  ?Allergen Reactions  ? Bee Pollen   ?  Other reaction(s): Cough ?"sinus issues"  ? Pollen Extract Cough  ?  "sinus issues"  ? Sulfamethoxazole Nausea And Vomiting  ? Sulfamethoxazole-Trimethoprim Rash  ? ? ?History of Present Illness  ?  ?Brian Cortez is a 71 y.o. male who presents via audio/video conferencing for a telehealth visit today.  Pt was last seen in cardiology clinic on 06/09/21 by Dr. Radford Pax.  At that time Brian Cortez was doing well.  The patient is now pending right total knee arthroplasty.  Since his last visit, he remains very active and exercises several days per week. He denies chest pain, dyspnea, palpitations, presyncope, or syncope. He denies bleeding concerns. He is tolerating all medications without concerns.  ? ? ?Home Medications  ?  ?Prior to Admission medications   ?Medication Sig Start Date End Date Taking? Authorizing Provider  ?allopurinol (ZYLOPRIM) 300 MG tablet Take 300 mg by mouth daily. 05/28/19   [provider]  ?anastrozole (ARIMIDEX) 1 MG tablet Take 2 mg by mouth daily. 12/05/20   [provider]  ?aspirin 81 MG EC tablet Take 1 tablet by mouth daily.    [provider]  ?bimatoprost (LUMIGAN) 0.01 % SOLN INSTILL ONE DROP IN [facility] EYE AT BEDTIME 07/31/18   [provider]   ?donepezil (ARICEPT ODT) 10 MG disintegrating tablet Take 1 tablet (10 mg total) by mouth at bedtime. 08/31/21   Dohmeier, Asencion Partridge, MD  ?escitalopram (LEXAPRO) 20 MG tablet Take 20 mg by mouth every morning.     [provider]  ?fenofibrate 160 MG tablet Take 1 tablet (160 mg total) by mouth daily. 06/09/21   Sueanne Margarita, MD  ?fexofenadine (ALLEGRA) 180 MG tablet Take 1 tablet by mouth as needed. 02/16/21   [provider]  ?fluconazole (DIFLUCAN) 150 MG tablet Take 1 tablet (150 mg total) by mouth once a week. ?Patient not taking: Reported on 03/02/2022 06/26/21   Evelina Bucy, DPM  ?hydrocortisone (ANUSOL-HC) 2.5 % rectal cream Place 1 application rectally 2 (two) times daily. 09/08/21   Irene Shipper, MD  ?icosapent Ethyl (VASCEPA) 1 g capsule Take 2 capsules (2 g total) by mouth 2 (two) times daily. 06/09/21   Sueanne Margarita, MD  ?Multiple Vitamins-Minerals (PRESERVISION  AREDS 2 PO) Take 1 capsule by mouth 2 (two) times daily.     [provider]  ?Omega-3 Fatty Acids (FISH OIL) 1000 MG CAPS Take 2 capsules (2,000 mg total) by mouth 2 (two) times daily. 09/05/20   Sueanne Margarita, MD  ?ondansetron (ZOFRAN) 4 MG tablet Take 1 tablet (4 mg total) by mouth every 6 (six) hours. ?Patient not taking: Reported on 03/02/2022 10/21/21   Lennice Sites, DO  ?pantoprazole (PROTONIX) 40 MG tablet TAKE ONE TABLET DAILY 01/04/22   Irene Shipper, MD  ?rosuvastatin (CRESTOR) 40 MG tablet Take 1 tablet (40 mg total) by mouth daily. 10/27/21   Sueanne Margarita, MD  ?testosterone cypionate (DEPOTESTOSTERONE CYPIONATE) 200 MG/ML injection Inject into the muscle. 01/02/21   [provider]  ?timolol (TIMOPTIC) 0.5 % ophthalmic solution Place 1 drop into both eyes daily. 12/18/20   [provider]  ? ? ?Physical Exam  ?  ?Vital Signs:  Brian Cortez does not have vital signs available for review today. ? ?Given telephonic nature of communication, physical exam is limited. ?AAOx3. NAD.  Normal affect.  Speech and respirations are unlabored. ? ?Accessory Clinical Findings  ?  ?None ? ?Assessment & Plan  ?  ?1.  Preoperative Cardiovascular Risk Assessment: Patient is doing well without cardiac

## 2022-03-02 NOTE — Telephone Encounter (Signed)
Pt agreeable to plan of care for tele appt today. Med rec and consent have been done.  ? ?  ?Patient Consent for Virtual Visit  ? ? ?   ? ?Brian Cortez has provided verbal consent on 03/02/2022 for a virtual visit (video or telephone). ? ? ?CONSENT FOR VIRTUAL VISIT FOR:  Brian Cortez  ?By participating in this virtual visit I agree to the following: ? ?I hereby voluntarily request, consent and authorize [facility] and its employed or contracted physicians, physician assistants, nurse practitioners or other licensed health care professionals (the Practitioner), to provide me with telemedicine health care services (the ?Services") as deemed necessary by the treating Practitioner. I acknowledge and consent to receive the Services by the Practitioner via telemedicine. I understand that the telemedicine visit will involve communicating with the Practitioner through live audiovisual communication technology and the disclosure of certain medical information by electronic transmission. I acknowledge that I have been given the opportunity to request an in-person assessment or other available alternative prior to the telemedicine visit and am voluntarily participating in the telemedicine visit. ? ?I understand that I have the right to withhold or withdraw my consent to the use of telemedicine in the course of my care at any time, without affecting my right to future care or treatment, and that the Practitioner or I may terminate the telemedicine visit at any time. I understand that I have the right to inspect all information obtained and/or recorded in the course of the telemedicine visit and may receive copies of available information for a reasonable fee.  I understand that some of the potential risks of receiving the Services via telemedicine include:  ?Delay or interruption in medical evaluation due to technological equipment failure or disruption; ?Information transmitted may not be sufficient (e.g. poor  resolution of images) to allow for appropriate medical decision making by the Practitioner; and/or  ?In rare instances, security protocols could fail, causing a breach of personal health information. ? ?Furthermore, I acknowledge that it is my responsibility to provide information about my medical history, conditions and care that is complete and accurate to the best of my ability. I acknowledge that Practitioner's advice, recommendations, and/or decision may be based on factors not within their control, such as incomplete or inaccurate data provided by me or distortions of diagnostic images or specimens that may result from electronic transmissions. I understand that the practice of medicine is not an exact science and that Practitioner makes no warranties or guarantees regarding treatment outcomes. I acknowledge that a copy of this consent can be made available to me via my patient portal ([facility]), or I can request a printed copy by calling the office of [facility].   ? ?I understand that my insurance will be billed for this visit.  ? ?I have read or had this consent read to me. ?I understand the contents of this consent, which adequately explains the benefits and risks of the Services being provided via telemedicine.  ?I have been provided ample opportunity to ask questions regarding this consent and the Services and have had my questions answered to my satisfaction. ?I give my informed consent for the services to be provided through the use of telemedicine in my medical care ? ? ?  ?

## 2022-03-02 NOTE — Telephone Encounter (Signed)
Pt agreeable to plan of care for tele appt today. Med rec and consent have been done. ?

## 2022-03-03 DIAGNOSIS — E291 Testicular hypofunction: Secondary | ICD-10-CM | POA: Diagnosis not present

## 2022-03-13 DIAGNOSIS — Z20822 Contact with and (suspected) exposure to covid-19: Secondary | ICD-10-CM | POA: Diagnosis not present

## 2022-03-15 DIAGNOSIS — I420 Dilated cardiomyopathy: Secondary | ICD-10-CM | POA: Diagnosis not present

## 2022-03-15 DIAGNOSIS — C679 Malignant neoplasm of bladder, unspecified: Secondary | ICD-10-CM | POA: Diagnosis not present

## 2022-03-15 DIAGNOSIS — M1990 Unspecified osteoarthritis, unspecified site: Secondary | ICD-10-CM | POA: Diagnosis not present

## 2022-03-15 DIAGNOSIS — M21161 Varus deformity, not elsewhere classified, right knee: Secondary | ICD-10-CM | POA: Diagnosis not present

## 2022-03-15 DIAGNOSIS — M21261 Flexion deformity, right knee: Secondary | ICD-10-CM | POA: Diagnosis not present

## 2022-03-15 DIAGNOSIS — Z79899 Other long term (current) drug therapy: Secondary | ICD-10-CM | POA: Diagnosis not present

## 2022-03-15 DIAGNOSIS — G3184 Mild cognitive impairment, so stated: Secondary | ICD-10-CM | POA: Diagnosis not present

## 2022-03-15 DIAGNOSIS — R54 Age-related physical debility: Secondary | ICD-10-CM | POA: Diagnosis not present

## 2022-03-15 DIAGNOSIS — G8918 Other acute postprocedural pain: Secondary | ICD-10-CM | POA: Diagnosis not present

## 2022-03-15 DIAGNOSIS — G4733 Obstructive sleep apnea (adult) (pediatric): Secondary | ICD-10-CM | POA: Diagnosis not present

## 2022-03-15 DIAGNOSIS — M25561 Pain in right knee: Secondary | ICD-10-CM | POA: Diagnosis not present

## 2022-03-15 DIAGNOSIS — Z6834 Body mass index (BMI) 34.0-34.9, adult: Secondary | ICD-10-CM | POA: Diagnosis not present

## 2022-03-15 DIAGNOSIS — K219 Gastro-esophageal reflux disease without esophagitis: Secondary | ICD-10-CM | POA: Diagnosis not present

## 2022-03-15 DIAGNOSIS — N1832 Chronic kidney disease, stage 3b: Secondary | ICD-10-CM | POA: Diagnosis not present

## 2022-03-15 DIAGNOSIS — M1711 Unilateral primary osteoarthritis, right knee: Secondary | ICD-10-CM | POA: Diagnosis not present

## 2022-03-15 DIAGNOSIS — I2510 Atherosclerotic heart disease of native coronary artery without angina pectoris: Secondary | ICD-10-CM | POA: Diagnosis not present

## 2022-03-15 DIAGNOSIS — E669 Obesity, unspecified: Secondary | ICD-10-CM | POA: Diagnosis not present

## 2022-03-15 DIAGNOSIS — I1310 Hypertensive heart and chronic kidney disease without heart failure, with stage 1 through stage 4 chronic kidney disease, or unspecified chronic kidney disease: Secondary | ICD-10-CM | POA: Diagnosis not present

## 2022-03-15 DIAGNOSIS — E291 Testicular hypofunction: Secondary | ICD-10-CM | POA: Diagnosis not present

## 2022-03-15 DIAGNOSIS — R7301 Impaired fasting glucose: Secondary | ICD-10-CM | POA: Diagnosis not present

## 2022-03-19 ENCOUNTER — Other Ambulatory Visit: Payer: Self-pay

## 2022-03-19 ENCOUNTER — Encounter (HOSPITAL_BASED_OUTPATIENT_CLINIC_OR_DEPARTMENT_OTHER): Payer: Self-pay | Admitting: Emergency Medicine

## 2022-03-19 ENCOUNTER — Emergency Department (HOSPITAL_BASED_OUTPATIENT_CLINIC_OR_DEPARTMENT_OTHER): Payer: PPO

## 2022-03-19 ENCOUNTER — Emergency Department (HOSPITAL_BASED_OUTPATIENT_CLINIC_OR_DEPARTMENT_OTHER)
Admission: EM | Admit: 2022-03-19 | Discharge: 2022-03-19 | Disposition: A | Discharge status: Home / Self Care | Payer: PPO | Attending: Emergency Medicine | Admitting: Emergency Medicine

## 2022-03-19 DIAGNOSIS — R0602 Shortness of breath: Secondary | ICD-10-CM | POA: Diagnosis not present

## 2022-03-19 DIAGNOSIS — Z96651 Presence of right artificial knee joint: Secondary | ICD-10-CM | POA: Insufficient documentation

## 2022-03-19 DIAGNOSIS — R066 Hiccough: Secondary | ICD-10-CM | POA: Insufficient documentation

## 2022-03-19 DIAGNOSIS — Z7982 Long term (current) use of aspirin: Secondary | ICD-10-CM | POA: Insufficient documentation

## 2022-03-19 DIAGNOSIS — M25561 Pain in right knee: Secondary | ICD-10-CM | POA: Diagnosis not present

## 2022-03-19 MED ORDER — CHLORPROMAZINE HCL 25 MG PO TABS: 25.0000 mg | ORAL_TABLET | Freq: Three times a day (TID) | ORAL | 0 refills | Status: DC | PRN

## 2022-03-19 MED ORDER — LORAZEPAM 1 MG PO TABS: 1.0000 mg | ORAL_TABLET | Freq: Four times a day (QID) | ORAL | 0 refills | Status: DC | PRN

## 2022-03-19 MED ORDER — CHLORPROMAZINE HCL 25 MG PO TABS
25.0000 mg | ORAL_TABLET | Freq: Once | ORAL | Status: AC
Start: 2022-03-19 — End: 2022-03-19
  Administered 2022-03-19: 25 mg via ORAL
  Filled 2022-03-19: qty 1

## 2022-03-19 MED ORDER — LORAZEPAM 2 MG/ML IJ SOLN
2.0000 mg | Freq: Once | INTRAMUSCULAR | Status: AC
Start: 2022-03-19 — End: 2022-03-19
  Administered 2022-03-19: 2 mg via INTRAMUSCULAR
  Filled 2022-03-19: qty 1

## 2022-03-19 MED ORDER — ONDANSETRON 4 MG PO TBDP
8.0000 mg | ORAL_TABLET | Freq: Once | ORAL | Status: AC
  Administered 2022-03-19: 8 mg via ORAL
  Filled 2022-03-19: qty 2

## 2022-03-19 NOTE — ED Notes (Signed)
pt running low grade temp  100.4 -- also O2 desat 89-91% -- had been sleeping and had received Ativan earlier; also reports using CPAP QHS at baseline   - O2 sats improved on 2L o2 via [facility] to 93% -- Dr Stark Jock notified via secure chat -- Pt does report nausea and sob improved since arrival   ?

## 2022-03-19 NOTE — ED Triage Notes (Signed)
?  Patient comes in with SOB that started earlier tonight.  Patient states he had R knee replacement on Monday and was discharged from [facility].  Woke up earlier tonight with hiccups and pain.  On call nurse from orthopedic office stated it may have been from his anesthesia and to come to the ED.  No pain at this time. ?

## 2022-03-19 NOTE — ED Notes (Signed)
ED Provider at bedside. 

## 2022-03-19 NOTE — ED Notes (Signed)
Pt agreeable with plan for d/c home -- did not want to further actions taken in regards to low grade temp - charge nurse has verbally reinforced d/c instructions and provided pt with written copy; pt acknowledges verbal understanding-- escorted out via w/c by this nurse; spouse at exit in vehicle and will transport pt home  ?

## 2022-03-19 NOTE — ED Provider Notes (Signed)
?[facility] ?Provider Note ? ? ?CSN: [id number] ?Arrival date & time: 03/19/22  0139 ? ?  ? ?History ? ?Chief Complaint  ?Patient presents with  ? Shortness of Breath  ? ? ?Brian PROSISE is a 71 y.o. male. ? ?Patient is a 71 year old male with past medical history of obstructive sleep apnea, seasonal allergies, and osteoarthritis of the right knee.  Patient is 3 days status post total knee replacement of the right knee performed at an outside facility.  Patient was discharged 2 days ago.  Since discharge, he reports having episodes of hiccups that make it difficult for him to breathe.  During his surgery, he received a medication called liposomal bupivacaine for anesthesia and pain relief.  He was told that his hiccups may be a side effect of this medication, but was told by his surgeons to come here for evaluation. ? ?The history is provided by the patient.  ? ?  ? ?Home Medications ?Prior to Admission medications   ?Medication Sig Start Date End Date Taking? Authorizing Provider  ?allopurinol (ZYLOPRIM) 300 MG tablet Take 300 mg by mouth daily. 05/28/19   [provider]  ?anastrozole (ARIMIDEX) 1 MG tablet Take 2 mg by mouth daily. 12/05/20   [provider]  ?aspirin 81 MG EC tablet Take 1 tablet by mouth daily.    [provider]  ?bimatoprost (LUMIGAN) 0.01 % SOLN INSTILL ONE DROP IN [facility] EYE AT BEDTIME 07/31/18   [provider]  ?donepezil (ARICEPT ODT) 10 MG disintegrating tablet Take 1 tablet (10 mg total) by mouth at bedtime. 08/31/21   Dohmeier, Asencion Partridge, MD  ?escitalopram (LEXAPRO) 20 MG tablet Take 20 mg by mouth every morning.     [provider]  ?fenofibrate 160 MG tablet Take 1 tablet (160 mg total) by mouth daily. 06/09/21   Sueanne Margarita, MD  ?fexofenadine (ALLEGRA) 180 MG tablet Take 1 tablet by mouth as needed. 02/16/21   [provider]  ?fluconazole (DIFLUCAN) 150 MG tablet Take 1 tablet (150 mg total) by mouth  once a week. ?Patient not taking: Reported on 03/02/2022 06/26/21   Evelina Bucy, DPM  ?hydrocortisone (ANUSOL-HC) 2.5 % rectal cream Place 1 application rectally 2 (two) times daily. 09/08/21   Irene Shipper, MD  ?icosapent Ethyl (VASCEPA) 1 g capsule Take 2 capsules (2 g total) by mouth 2 (two) times daily. 06/09/21   Sueanne Margarita, MD  ?Multiple Vitamins-Minerals (PRESERVISION AREDS 2 PO) Take 1 capsule by mouth 2 (two) times daily.     [provider]  ?Omega-3 Fatty Acids (FISH OIL) 1000 MG CAPS Take 2 capsules (2,000 mg total) by mouth 2 (two) times daily. 09/05/20   Sueanne Margarita, MD  ?ondansetron (ZOFRAN) 4 MG tablet Take 1 tablet (4 mg total) by mouth every 6 (six) hours. ?Patient not taking: Reported on 03/02/2022 10/21/21   Lennice Sites, DO  ?pantoprazole (PROTONIX) 40 MG tablet TAKE ONE TABLET DAILY 01/04/22   Irene Shipper, MD  ?rosuvastatin (CRESTOR) 40 MG tablet Take 1 tablet (40 mg total) by mouth daily. 10/27/21   Sueanne Margarita, MD  ?testosterone cypionate (DEPOTESTOSTERONE CYPIONATE) 200 MG/ML injection Inject into the muscle. 01/02/21   [provider]  ?timolol (TIMOPTIC) 0.5 % ophthalmic solution Place 1 drop into both eyes daily. 12/18/20   [provider]  ?   ? ?Allergies    ?Bee pollen, Pollen extract, Sulfamethoxazole, and Sulfamethoxazole-trimethoprim   ? ?Review of Systems   ?  Review of Systems  ?All other systems reviewed and are negative. ? ?Physical Exam ?Updated Vital Signs ?BP (!) 159/82 (BP Location: Left Arm)   Pulse 83   Temp 98.6 ?F (37 ?C)   Resp 14   Ht '6\' 3"'$  (1.905 m)   Wt 124.7 kg   SpO2 99%   BMI 34.37 kg/m?  ?Physical Exam ?Vitals and nursing note reviewed.  ?Constitutional:   ?   General: He is not in acute distress. ?   Appearance: He is well-developed. He is not diaphoretic.  ?HENT:  ?   Head: Normocephalic and atraumatic.  ?Cardiovascular:  ?   Rate and Rhythm: Normal rate and regular rhythm.  ?   Heart sounds: No murmur heard. ?  No  friction rub.  ?Pulmonary:  ?   Effort: Pulmonary effort is normal. No respiratory distress.  ?   Breath sounds: Normal breath sounds. No wheezing or rales.  ?   Comments: Breath sounds are clear and equal, however patient has episodes of hiccups/stuttering that he states makes it difficult for him to breathe. ?Abdominal:  ?   General: Bowel sounds are normal. There is no distension.  ?   Palpations: Abdomen is soft.  ?   Tenderness: There is no abdominal tenderness.  ?Musculoskeletal:     ?   General: Normal range of motion.  ?   Cervical back: Normal range of motion and neck supple.  ?   Comments: The dressing is in place to the right knee.  There is no surrounding erythema or drainage.  ?Skin: ?   General: Skin is warm and dry.  ?Neurological:  ?   Mental Status: He is alert and oriented to person, place, and time.  ?   Coordination: Coordination normal.  ? ? ?ED Results / Procedures / Treatments   ?Labs ?(all labs ordered are listed, but only abnormal results are displayed) ?Labs Reviewed - No data to display ? ?EKG ?None ? ?Radiology ?No results found. ? ?Procedures ?Procedures  ? ? ?Medications Ordered in ED ?Medications  ?chlorproMAZINE (THORAZINE) tablet 25 mg (has no administration in time range)  ?LORazepam (ATIVAN) injection 2 mg (has no administration in time range)  ? ? ?ED Course/ Medical Decision Making/ A&P ? ?Patient presenting here with complaints of hiccuping.  This started after receiving liposomal bupivacaine for anesthesia on a total knee replacement 4 days ago.  Hiccups are listed as a side effect of this medication and I suspect that this is the cause.  He was given Thorazine and Ativan here and is feeling better. ? ?Patient did develop low-grade fever of 100.4 while in the ER, but is denying any symptoms and declining any further work-up into this.  He did have a chest x-ray which was unremarkable.  Wife reports that he had to have multiple catheterizations performed after his surgery  because he was unable to void.  I had intended to obtain a urinalysis, however patient states that he is not having any symptoms and would like to be discharged.  Patient will be discharged with Ativan and Thorazine. ? ?Final Clinical Impression(s) / ED Diagnoses ?Final diagnoses:  ?None  ? ? ?Rx / DC Orders ?ED Discharge Orders   ? ? None  ? ?  ? ? ?  ?Veryl Speak, MD ?03/19/22 [phone] ? ?

## 2022-03-19 NOTE — Discharge Instructions (Addendum)
Begin taking Thorazine and Ativan as prescribed as needed for hiccups. ? ?Follow-up with primary doctor if symptoms persist, and return to the ER if symptoms significantly worsen or change. ?

## 2022-03-20 ENCOUNTER — Encounter (HOSPITAL_COMMUNITY): Payer: Self-pay | Admitting: *Deleted

## 2022-03-20 ENCOUNTER — Emergency Department (HOSPITAL_COMMUNITY)
Admission: EM | Admit: 2022-03-20 | Discharge: 2022-03-21 | Disposition: A | Discharge status: Home / Self Care | Payer: PPO | Attending: Emergency Medicine | Admitting: Emergency Medicine

## 2022-03-20 ENCOUNTER — Emergency Department (HOSPITAL_COMMUNITY): Payer: PPO

## 2022-03-20 ENCOUNTER — Other Ambulatory Visit: Payer: Self-pay

## 2022-03-20 DIAGNOSIS — Z7982 Long term (current) use of aspirin: Secondary | ICD-10-CM | POA: Diagnosis not present

## 2022-03-20 DIAGNOSIS — I2510 Atherosclerotic heart disease of native coronary artery without angina pectoris: Secondary | ICD-10-CM | POA: Insufficient documentation

## 2022-03-20 DIAGNOSIS — K449 Diaphragmatic hernia without obstruction or gangrene: Secondary | ICD-10-CM | POA: Diagnosis not present

## 2022-03-20 DIAGNOSIS — R109 Unspecified abdominal pain: Secondary | ICD-10-CM | POA: Insufficient documentation

## 2022-03-20 DIAGNOSIS — I10 Essential (primary) hypertension: Secondary | ICD-10-CM | POA: Diagnosis not present

## 2022-03-20 DIAGNOSIS — R066 Hiccough: Secondary | ICD-10-CM | POA: Diagnosis not present

## 2022-03-20 DIAGNOSIS — I700 Atherosclerosis of aorta: Secondary | ICD-10-CM | POA: Diagnosis not present

## 2022-03-20 DIAGNOSIS — Z9049 Acquired absence of other specified parts of digestive tract: Secondary | ICD-10-CM | POA: Diagnosis not present

## 2022-03-20 DIAGNOSIS — R079 Chest pain, unspecified: Secondary | ICD-10-CM | POA: Diagnosis not present

## 2022-03-20 LAB — CBC WITH DIFFERENTIAL/PLATELET
Abs Immature Granulocytes: 0.04 10*3/uL (ref 0.00–0.07)
Basophils Absolute: 0 10*3/uL (ref 0.0–0.1)
Basophils Relative: 0 %
Eosinophils Absolute: 0.1 10*3/uL (ref 0.0–0.5)
Eosinophils Relative: 1 %
HCT: 39.3 % (ref 39.0–52.0)
Hemoglobin: 12.7 g/dL — ABNORMAL LOW (ref 13.0–17.0)
Immature Granulocytes: 1 %
Lymphocytes Relative: 16 %
Lymphs Abs: 1.4 10*3/uL (ref 0.7–4.0)
MCH: 30.2 pg (ref 26.0–34.0)
MCHC: 32.3 g/dL (ref 30.0–36.0)
MCV: 93.6 fL (ref 80.0–100.0)
Monocytes Absolute: 1 10*3/uL (ref 0.1–1.0)
Monocytes Relative: 11 %
Neutro Abs: 6.3 10*3/uL (ref 1.7–7.7)
Neutrophils Relative %: 71 %
Platelets: 238 10*3/uL (ref 150–400)
RBC: 4.2 MIL/uL — ABNORMAL LOW (ref 4.22–5.81)
RDW: 12.9 % (ref 11.5–15.5)
WBC: 8.9 10*3/uL (ref 4.0–10.5)
nRBC: 0 % (ref 0.0–0.2)

## 2022-03-20 LAB — LIPASE, BLOOD: Lipase: 23 U/L (ref 11–51)

## 2022-03-20 LAB — COMPREHENSIVE METABOLIC PANEL
ALT: 24 U/L (ref 0–44)
AST: 37 U/L (ref 15–41)
Albumin: 3.4 g/dL — ABNORMAL LOW (ref 3.5–5.0)
Alkaline Phosphatase: 42 U/L (ref 38–126)
Anion gap: 7 (ref 5–15)
BUN: 27 mg/dL — ABNORMAL HIGH (ref 8–23)
CO2: 29 mmol/L (ref 22–32)
Calcium: 8.7 mg/dL — ABNORMAL LOW (ref 8.9–10.3)
Chloride: 101 mmol/L (ref 98–111)
Creatinine, Ser: 1.3 mg/dL — ABNORMAL HIGH (ref 0.61–1.24)
GFR, Estimated: 59 mL/min — ABNORMAL LOW (ref >60)
Glucose, Bld: 106 mg/dL — ABNORMAL HIGH (ref 70–99)
Potassium: 3.7 mmol/L (ref 3.5–5.1)
Sodium: 137 mmol/L (ref 135–145)
Total Bilirubin: 1.3 mg/dL — ABNORMAL HIGH (ref 0.3–1.2)
Total Protein: 6.6 g/dL (ref 6.5–8.1)

## 2022-03-20 LAB — TROPONIN I (HIGH SENSITIVITY): Troponin I (High Sensitivity): 8 ng/L (ref <18)

## 2022-03-20 MED ORDER — IOHEXOL 350 MG/ML SOLN
100.0000 mL | Freq: Once | INTRAVENOUS | Status: AC | PRN
  Administered 2022-03-20: 100 mL via INTRAVENOUS

## 2022-03-20 MED ORDER — LORAZEPAM 2 MG/ML IJ SOLN
2.0000 mg | Freq: Once | INTRAMUSCULAR | Status: AC
  Administered 2022-03-20: 2 mg via INTRAVENOUS
  Filled 2022-03-20: qty 1

## 2022-03-20 MED ORDER — METOCLOPRAMIDE HCL 5 MG/ML IJ SOLN
10.0000 mg | Freq: Once | INTRAMUSCULAR | Status: AC
Start: 2022-03-20 — End: 2022-03-20
  Administered 2022-03-20: 10 mg via INTRAVENOUS
  Filled 2022-03-20: qty 2

## 2022-03-20 MED ORDER — LORAZEPAM 2 MG/ML IJ SOLN
2.0000 mg | Freq: Once | INTRAMUSCULAR | Status: AC
  Administered 2022-03-21: 2 mg via INTRAVENOUS
  Filled 2022-03-20: qty 1

## 2022-03-20 MED ORDER — METOCLOPRAMIDE HCL 5 MG/ML IJ SOLN
10.0000 mg | Freq: Once | INTRAMUSCULAR | Status: AC
  Administered 2022-03-20: 10 mg via INTRAVENOUS
  Filled 2022-03-20: qty 2

## 2022-03-20 NOTE — ED Notes (Signed)
Patient transported to CT 

## 2022-03-20 NOTE — ED Notes (Signed)
Patient tolerated sips of water.  ?

## 2022-03-20 NOTE — ED Provider Notes (Signed)
Received at shift change from [facility] PA-C please see her note for full detail ? ?In short patient with medical history including hypertension hyperlipidemia GERD CAD diastolic dysfunction presents with complaints of a hiccup as well as constipation.  Had recent right knee replacement on 04/24 by Dr. Landis Gandy.  Patient states since then he has not had a bowel movement and has been having hiccups which have caused got worse.  He had to come to the emergency department yesterday was given Ativan and thorazine and was discharged home.  But unfortunately back today as his hiccups had returned. ? ?Per previous provider follow-up on imaging discharge home as long as imaging is negative, patient can be discharged home with Compazine. ? ?Lab work-which I have personally reviewed and interpreted CBC shows normocytic anemia with hemoglobin 12.7, CMP shows glucose of 106 BUN of 27 creatinine 1.30 calcium 8.7 albumin 3.4 total T. bili 1.3 GFR 59, lipase 23, first troponin is 8.  ? ?Imaging-which I personally reviewed and interpreted x-ray of abdomen chest was negative for acute findings.  CTA chest negative for acute findings, CT abdomen pelvis negative for acute findings ? ?EKG-which I personally reviewed and interpreted ? ?Reassessment ? ?Patient is reassessed his heart pressure coming, he states he is ready for discharge. ? ?Notified by nursing staff that patient hiccups have returned, he states it happened after he took a drink of water, he states he would like some warm liquid, will follow with regular and reassess. ? ?Told by nursing staff that patient would like to leave, he states he would like to go home as he would feel better and being at home explained to him that do not have all the lab work i.e. the troponin but he would like to leave.  We will go ahead and discharge the patient. ? ?Rule out- ?I have low suspicion for ACS as history is atypical, , EKG was sinus rhythm without signs of ischemia, patient had  a negative troponin, and has had no actual chest pain.  Low suspicion for PE or dissection presentation atypical, CTA of chest is negative for these findings.  I have low suspicion for intra-abdominal abnormality i.e. a bowel perforation/infection as CT imaging is negative these findings.  I have low suspicion for DVT denies any leg swelling, there is no unilateral leg swelling my exam no calf tenderness.  Low suspicion for systemic infection as patient is nontoxic-appearing, vital signs reassuring, no obvious source infection noted on exam. ? ? ? ?Disposition ? ?Hiccups-unclear etiology likely secondary from anesthesia, will provide him with Reglan as well as Ativan, have him follow-up with surgeon for further evaluation.  Strict return precautions. ? ? ?  ?Marcello Fennel, PA-C ?03/21/22 2500 ? ?  ?Orpah Greek, MD ?03/21/22 0354 ? ?

## 2022-03-20 NOTE — ED Notes (Signed)
Patients hiccups have returned. PA notified.  ?

## 2022-03-20 NOTE — ED Triage Notes (Signed)
Pt had knee replacement on Monday at [facility], developed hiccups shortly after, now has shob with hiccups. Seen at [facility] for same given Chlorpromazine and lorazapam which has not helped ?

## 2022-03-20 NOTE — ED Provider Triage Note (Signed)
Emergency Medicine Provider Triage Evaluation Note ? ?Brian Cortez , a 71 y.o. male  was evaluated in triage.  Pt complains of hiccups.  Had recent right knee surgery at [facility].  Was seen yesterday for similar complaints.  Did not have any imaging performed.  He is also not had a bowel movement in over a week.  Does state he feels short of breath with his hiccups however denies any overt chest pain, abdominal pain.  Patient is diaphoretic in the room ? ?Review of Systems  ?Positive: Hiccups, diaphoresis ?Negative: Abd pain, cp ? ?Physical Exam  ?Ht '6\' 3"'$  (1.905 m)   Wt 123.4 kg   BMI 34.00 kg/m?  ?Gen:   Awake, diaphoretic, active hiccups ?Resp:  Normal effort  ?MSK:   Moves extremities without difficulty, right knee bandage, non tender posterior calves ?Other:   ? ?Medical Decision Making  ?Medically screening exam initiated at 6:48 PM.  Appropriate orders placed.  Brian Cortez was informed that the remainder of the evaluation will be completed by another provider, this initial triage assessment does not replace that evaluation, and the importance of remaining in the ED until their evaluation is complete. ? ?hiccups ?  ?Elena Cothern A, PA-C ?03/20/22 1850 ? ?

## 2022-03-20 NOTE — ED Provider Notes (Signed)
?[facility] DEPT ?Provider Note ? ? ?CSN: [id number] ?Arrival date & time: 03/20/22  1836 ? ?  ? ?History ?PMH: HTN, HLD, GERD, CAD, diastolic dysfunction, mild pulmonary hypertension, OSA on CPAP, Nonischemic cardiomyopathy ?Chief Complaint  ?Patient presents with  ? Hiccups  ? ? ?Brian Cortez is a 71 y.o. male.  Presents with a chief complaint of.  He states that he had a recent total knee replacement on April 24.  He says right before he was being discharged, he started developing hiccups.  He says that they were mild at first and was not bothering him too much.  He states that on Thursday, they started to get more more severe.  He ended up going to the emergency room at [facility] and was seen there.  Looks like at that time, he was given Thorazine and Ativan and apparently started feeling a little bit better.  He also developed 100.4 degree fever while in the ED and had a negative chest x-ray.  Patient states that soon as he got back to the car he immediately started hiccuping again.  He called his orthopedic surgeon and they recommended that he go back to the emergency department to r/o bowel obstruction.  Patient states that the hiccups are so bad at this point that he has worsening shortness of breath.  He is unable to tolerate his CPAP at night.  He has not had a bowel movement in over a week. ? ?HPI ? ?  ? ?Home Medications ?Prior to Admission medications   ?Medication Sig Start Date End Date Taking? Authorizing Provider  ?allopurinol (ZYLOPRIM) 300 MG tablet Take 300 mg by mouth daily. 05/28/19   [provider]  ?anastrozole (ARIMIDEX) 1 MG tablet Take 2 mg by mouth daily. 12/05/20   [provider]  ?aspirin 81 MG EC tablet Take 1 tablet by mouth daily.    [provider]  ?bimatoprost (LUMIGAN) 0.01 % SOLN INSTILL ONE DROP IN [facility] EYE AT BEDTIME 07/31/18   [provider]  ?chlorproMAZINE (THORAZINE) 25 MG tablet Take 1 tablet (25 mg  total) by mouth 3 (three) times daily as needed. 03/19/22   Veryl Speak, MD  ?donepezil (ARICEPT ODT) 10 MG disintegrating tablet Take 1 tablet (10 mg total) by mouth at bedtime. 08/31/21   Dohmeier, Asencion Partridge, MD  ?escitalopram (LEXAPRO) 20 MG tablet Take 20 mg by mouth every morning.     [provider]  ?fenofibrate 160 MG tablet Take 1 tablet (160 mg total) by mouth daily. 06/09/21   Sueanne Margarita, MD  ?fexofenadine (ALLEGRA) 180 MG tablet Take 1 tablet by mouth as needed. 02/16/21   [provider]  ?fluconazole (DIFLUCAN) 150 MG tablet Take 1 tablet (150 mg total) by mouth once a week. ?Patient not taking: Reported on 03/02/2022 06/26/21   Evelina Bucy, DPM  ?hydrocortisone (ANUSOL-HC) 2.5 % rectal cream Place 1 application rectally 2 (two) times daily. 09/08/21   Irene Shipper, MD  ?icosapent Ethyl (VASCEPA) 1 g capsule Take 2 capsules (2 g total) by mouth 2 (two) times daily. 06/09/21   Sueanne Margarita, MD  ?LORazepam (ATIVAN) 1 MG tablet Take 1 tablet (1 mg total) by mouth every 6 (six) hours as needed for anxiety. 03/19/22   Veryl Speak, MD  ?Multiple Vitamins-Minerals (PRESERVISION AREDS 2 PO) Take 1 capsule by mouth 2 (two) times daily.     [provider]  ?Omega-3 Fatty Acids (FISH OIL) 1000 MG CAPS Take 2  capsules (2,000 mg total) by mouth 2 (two) times daily. 09/05/20   Sueanne Margarita, MD  ?ondansetron (ZOFRAN) 4 MG tablet Take 1 tablet (4 mg total) by mouth every 6 (six) hours. ?Patient not taking: Reported on 03/02/2022 10/21/21   Lennice Sites, DO  ?pantoprazole (PROTONIX) 40 MG tablet TAKE ONE TABLET DAILY 01/04/22   Irene Shipper, MD  ?rosuvastatin (CRESTOR) 40 MG tablet Take 1 tablet (40 mg total) by mouth daily. 10/27/21   Sueanne Margarita, MD  ?testosterone cypionate (DEPOTESTOSTERONE CYPIONATE) 200 MG/ML injection Inject into the muscle. 01/02/21   [provider]  ?timolol (TIMOPTIC) 0.5 % ophthalmic solution Place 1 drop into both eyes daily. 12/18/20    [provider]  ?   ? ?Allergies    ?Bee pollen, Pollen extract, Sulfamethoxazole, and Sulfamethoxazole-trimethoprim   ? ?Review of Systems   ?Review of Systems  ?Respiratory:  Positive for shortness of breath.   ?     Hiccups  ?All other systems reviewed and are negative. ? ?Physical Exam ?Updated Vital Signs ?BP (!) 146/93 (BP Location: Left Arm)   Pulse 84   Temp (!) 97.5 ?F (36.4 ?C) (Oral)   Resp 20   Ht '6\' 3"'$  (1.905 m)   Wt 123.4 kg   SpO2 97%   BMI 34.00 kg/m?  ?Physical Exam ?Vitals and nursing note reviewed.  ?Constitutional:   ?   General: He is not in acute distress. ?   Appearance: Normal appearance. He is not ill-appearing, toxic-appearing or diaphoretic.  ?HENT:  ?   Head: Normocephalic and atraumatic.  ?   Nose: No nasal deformity.  ?   Mouth/Throat:  ?   Lips: Pink. No lesions.  ?   Mouth: Mucous membranes are moist. No injury, lacerations, oral lesions or angioedema.  ?   Pharynx: Oropharynx is clear. Uvula midline. No pharyngeal swelling, oropharyngeal exudate, posterior oropharyngeal erythema or uvula swelling.  ?Eyes:  ?   General: Gaze aligned appropriately. No scleral icterus.    ?   Right eye: No discharge.     ?   Left eye: No discharge.  ?   Conjunctiva/sclera: Conjunctivae normal.  ?   Right eye: Right conjunctiva is not injected. No exudate or hemorrhage. ?   Left eye: Left conjunctiva is not injected. No exudate or hemorrhage. ?Cardiovascular:  ?   Rate and Rhythm: Normal rate and regular rhythm.  ?   Pulses: Normal pulses.     ?     Radial pulses are 2+ on the right side and 2+ on the left side.  ?     Dorsalis pedis pulses are 2+ on the right side and 2+ on the left side.  ?   Heart sounds: Normal heart sounds, S1 normal and S2 normal. Heart sounds not distant. No murmur heard. ?  No friction rub. No gallop. No S3 or S4 sounds.  ?Pulmonary:  ?   Effort: Pulmonary effort is normal. No accessory muscle usage or respiratory distress.  ?   Breath sounds: Normal breath  sounds. No stridor. No wheezing, rhonchi or rales.  ?Chest:  ?   Chest wall: No tenderness.  ?Abdominal:  ?   General: Abdomen is flat. Bowel sounds are normal. There is no distension.  ?   Palpations: Abdomen is soft. There is no mass or pulsatile mass.  ?   Tenderness: There is no abdominal tenderness. There is no right CVA tenderness, left CVA tenderness, guarding or rebound.  ?Musculoskeletal:  ?  Right lower leg: No edema.  ?   Left lower leg: No edema.  ?Skin: ?   General: Skin is warm and dry.  ?   Coloration: Skin is not jaundiced or pale.  ?   Findings: No bruising, erythema, lesion or rash.  ?Neurological:  ?   General: No focal deficit present.  ?   Mental Status: He is alert and oriented to person, place, and time.  ?   GCS: GCS eye subscore is 4. GCS verbal subscore is 5. GCS motor subscore is 6.  ?Psychiatric:     ?   Mood and Affect: Mood normal.     ?   Behavior: Behavior normal. Behavior is cooperative.  ? ? ?ED Results / Procedures / Treatments   ?Labs ?(all labs ordered are listed, but only abnormal results are displayed) ?Labs Reviewed  ?CBC WITH DIFFERENTIAL/PLATELET  ?COMPREHENSIVE METABOLIC PANEL  ?LIPASE, BLOOD  ?TROPONIN I (HIGH SENSITIVITY)  ? ? ?EKG ?None ? ?Radiology ?DG Chest Port 1 View ? ?Result Date: 03/19/2022 ?CLINICAL DATA:  Shortness of breath, hiccups EXAM: PORTABLE CHEST 1 VIEW COMPARISON:  08/07/2013 FINDINGS: Lungs are clear.  No pleural effusion or pneumothorax. The heart is normal in size. IMPRESSION: No evidence of acute cardiopulmonary disease. Electronically Signed   By: Julian Hy M.D.   On: 03/19/2022 03:23   ? ?Procedures ?Procedures  ? ? ?Medications Ordered in ED ?Medications  ?LORazepam (ATIVAN) injection 2 mg (has no administration in time range)  ? ? ?ED Course/ Medical Decision Making/ A&P ?  ?                        ?Medical Decision Making ?Amount and/or Complexity of Data Reviewed ?Radiology: ordered. ? ?Risk ?Prescription drug management. ? ? ? ?MDM   ?This is a 71 y.o. male who presents to the ED with intractable hiccups ?The differential of this patient includes but is not limited to irritation of the diaphragm, anesthesia complication, pulmonary embolism, bowel obstr

## 2022-03-20 NOTE — ED Notes (Signed)
Patient does not appear to have hiccups at this time. ?

## 2022-03-21 MED ORDER — LORAZEPAM 1 MG PO TABS: 1.0000 mg | ORAL_TABLET | Freq: Three times a day (TID) | ORAL | 0 refills | Status: AC | PRN

## 2022-03-21 MED ORDER — METOCLOPRAMIDE HCL 10 MG PO TABS: 10.0000 mg | ORAL_TABLET | Freq: Three times a day (TID) | ORAL | 0 refills | Status: DC | PRN

## 2022-03-21 NOTE — Discharge Instructions (Addendum)
All your lab work imaging was reassuring, I have given you additional Ativan as well as Reglan please take as prescribed for your hiccups. ? ?Please continue all home medications, ? ?Come back to the emergency department if you develop chest pain, shortness of breath, severe abdominal pain, uncontrolled nausea, vomiting, diarrhea. ? ?

## 2022-03-24 DIAGNOSIS — E291 Testicular hypofunction: Secondary | ICD-10-CM | POA: Diagnosis not present

## 2022-03-25 DIAGNOSIS — M25561 Pain in right knee: Secondary | ICD-10-CM | POA: Diagnosis not present

## 2022-03-25 DIAGNOSIS — K219 Gastro-esophageal reflux disease without esophagitis: Secondary | ICD-10-CM | POA: Diagnosis not present

## 2022-03-25 DIAGNOSIS — Z96651 Presence of right artificial knee joint: Secondary | ICD-10-CM | POA: Diagnosis not present

## 2022-03-25 DIAGNOSIS — R066 Hiccough: Secondary | ICD-10-CM | POA: Diagnosis not present

## 2022-03-25 DIAGNOSIS — I10 Essential (primary) hypertension: Secondary | ICD-10-CM | POA: Diagnosis not present

## 2022-03-29 DIAGNOSIS — R29898 Other symptoms and signs involving the musculoskeletal system: Secondary | ICD-10-CM | POA: Diagnosis not present

## 2022-03-29 DIAGNOSIS — Z7409 Other reduced mobility: Secondary | ICD-10-CM | POA: Diagnosis not present

## 2022-03-29 DIAGNOSIS — M1711 Unilateral primary osteoarthritis, right knee: Secondary | ICD-10-CM | POA: Diagnosis not present

## 2022-03-29 DIAGNOSIS — M25561 Pain in right knee: Secondary | ICD-10-CM | POA: Diagnosis not present

## 2022-03-29 DIAGNOSIS — Z789 Other specified health status: Secondary | ICD-10-CM | POA: Diagnosis not present

## 2022-03-29 DIAGNOSIS — Z96651 Presence of right artificial knee joint: Secondary | ICD-10-CM | POA: Diagnosis not present

## 2022-03-30 ENCOUNTER — Encounter: Payer: Self-pay | Admitting: Internal Medicine

## 2022-03-31 MED ORDER — METRONIDAZOLE 250 MG PO TABS: 250.0000 mg | ORAL_TABLET | Freq: Four times a day (QID) | ORAL | 0 refills | Status: AC

## 2022-03-31 NOTE — Telephone Encounter (Signed)
Reviewed. ?1.  Double Citrucel dosage ?2.  Prescribe metronidazole 250 mg 4 times daily for 10 days.  No alcohol when on this medication ?3.  Have him give [place] a follow-up phone call in 2 weeks. ?Thanks ?Dr. Henrene Pastor ?

## 2022-04-01 DIAGNOSIS — Z96651 Presence of right artificial knee joint: Secondary | ICD-10-CM | POA: Diagnosis not present

## 2022-04-01 DIAGNOSIS — R2241 Localized swelling, mass and lump, right lower limb: Secondary | ICD-10-CM | POA: Diagnosis not present

## 2022-04-02 DIAGNOSIS — R29898 Other symptoms and signs involving the musculoskeletal system: Secondary | ICD-10-CM | POA: Diagnosis not present

## 2022-04-02 DIAGNOSIS — Z96651 Presence of right artificial knee joint: Secondary | ICD-10-CM | POA: Diagnosis not present

## 2022-04-02 DIAGNOSIS — Z7409 Other reduced mobility: Secondary | ICD-10-CM | POA: Diagnosis not present

## 2022-04-02 DIAGNOSIS — M1711 Unilateral primary osteoarthritis, right knee: Secondary | ICD-10-CM | POA: Diagnosis not present

## 2022-04-02 DIAGNOSIS — M25561 Pain in right knee: Secondary | ICD-10-CM | POA: Diagnosis not present

## 2022-04-02 DIAGNOSIS — Z789 Other specified health status: Secondary | ICD-10-CM | POA: Diagnosis not present

## 2022-04-06 DIAGNOSIS — M25561 Pain in right knee: Secondary | ICD-10-CM | POA: Diagnosis not present

## 2022-04-06 DIAGNOSIS — Z789 Other specified health status: Secondary | ICD-10-CM | POA: Diagnosis not present

## 2022-04-06 DIAGNOSIS — Z96651 Presence of right artificial knee joint: Secondary | ICD-10-CM | POA: Diagnosis not present

## 2022-04-06 DIAGNOSIS — M1711 Unilateral primary osteoarthritis, right knee: Secondary | ICD-10-CM | POA: Diagnosis not present

## 2022-04-06 DIAGNOSIS — Z7409 Other reduced mobility: Secondary | ICD-10-CM | POA: Diagnosis not present

## 2022-04-06 DIAGNOSIS — R29898 Other symptoms and signs involving the musculoskeletal system: Secondary | ICD-10-CM | POA: Diagnosis not present

## 2022-04-07 DIAGNOSIS — E291 Testicular hypofunction: Secondary | ICD-10-CM | POA: Diagnosis not present

## 2022-04-08 ENCOUNTER — Encounter (INDEPENDENT_AMBULATORY_CARE_PROVIDER_SITE_OTHER): Payer: Self-pay | Admitting: Ophthalmology

## 2022-04-08 ENCOUNTER — Ambulatory Visit (INDEPENDENT_AMBULATORY_CARE_PROVIDER_SITE_OTHER): Payer: PPO | Admitting: Ophthalmology

## 2022-04-08 DIAGNOSIS — Z7409 Other reduced mobility: Secondary | ICD-10-CM | POA: Diagnosis not present

## 2022-04-08 DIAGNOSIS — Z789 Other specified health status: Secondary | ICD-10-CM | POA: Diagnosis not present

## 2022-04-08 DIAGNOSIS — M25561 Pain in right knee: Secondary | ICD-10-CM | POA: Diagnosis not present

## 2022-04-08 DIAGNOSIS — Z9889 Other specified postprocedural states: Secondary | ICD-10-CM | POA: Diagnosis not present

## 2022-04-08 DIAGNOSIS — H43811 Vitreous degeneration, right eye: Secondary | ICD-10-CM | POA: Diagnosis not present

## 2022-04-08 DIAGNOSIS — H3530 Unspecified macular degeneration: Secondary | ICD-10-CM

## 2022-04-08 DIAGNOSIS — Z96651 Presence of right artificial knee joint: Secondary | ICD-10-CM | POA: Diagnosis not present

## 2022-04-08 DIAGNOSIS — M1711 Unilateral primary osteoarthritis, right knee: Secondary | ICD-10-CM | POA: Diagnosis not present

## 2022-04-08 DIAGNOSIS — R29898 Other symptoms and signs involving the musculoskeletal system: Secondary | ICD-10-CM | POA: Diagnosis not present

## 2022-04-08 NOTE — Progress Notes (Signed)
04/08/2022     CHIEF COMPLAINT Patient presents for  Chief Complaint  Patient presents with   Retina Follow Up      HISTORY OF PRESENT ILLNESS: Brian Cortez is a 71 y.o. male who presents to the clinic today for:   HPI     Retina Follow Up           Diagnosis: Other (Senile Macular retinal degeneration)   Laterality: both eyes   Onset: 1 year ago   Course: stable         Comments   1 yr fu OU oct . Patient states vision is stable and unchanged since last visit. Denies any new floaters or FOL.       Last edited by Laurin Coder on 04/08/2022 10:45 AM.      Referring physician: Rondel Oh, [facility] [place],  [place] [id number]  HISTORICAL INFORMATION:   Selected notes from the MEDICAL RECORD NUMBER    Lab Results  Component Value Date   HGBA1C 5.7 01/30/2010     CURRENT MEDICATIONS: Current Outpatient Medications (Ophthalmic Drugs)  Medication Sig   bimatoprost (LUMIGAN) 0.01 % SOLN INSTILL ONE DROP IN [facility] EYE AT BEDTIME   timolol (TIMOPTIC) 0.5 % ophthalmic solution Place 1 drop into both eyes daily.   No current facility-administered medications for this visit. (Ophthalmic Drugs)   Current Outpatient Medications (Other)  Medication Sig   allopurinol (ZYLOPRIM) 300 MG tablet Take 300 mg by mouth daily.   anastrozole (ARIMIDEX) 1 MG tablet Take 2 mg by mouth daily.   aspirin 81 MG EC tablet Take 1 tablet by mouth daily.   chlorproMAZINE (THORAZINE) 25 MG tablet Take 1 tablet (25 mg total) by mouth 3 (three) times daily as needed.   donepezil (ARICEPT ODT) 10 MG disintegrating tablet Take 1 tablet (10 mg total) by mouth at bedtime.   escitalopram (LEXAPRO) 20 MG tablet Take 20 mg by mouth every morning.    fenofibrate 160 MG tablet Take 1 tablet (160 mg total) by mouth daily.   fexofenadine (ALLEGRA) 180 MG tablet Take 1 tablet by mouth as needed.   fluconazole (DIFLUCAN) 150 MG tablet Take 1 tablet (150 mg total) by  mouth once a week. (Patient not taking: Reported on 03/02/2022)   hydrocortisone (ANUSOL-HC) 2.5 % rectal cream Place 1 application rectally 2 (two) times daily.   icosapent Ethyl (VASCEPA) 1 g capsule Take 2 capsules (2 g total) by mouth 2 (two) times daily.   LORazepam (ATIVAN) 1 MG tablet Take 1 tablet (1 mg total) by mouth every 6 (six) hours as needed for anxiety.   metoCLOPramide (REGLAN) 10 MG tablet Take 1 tablet (10 mg total) by mouth every 8 (eight) hours as needed for up to 3 days for nausea.   metroNIDAZOLE (FLAGYL) 250 MG tablet Take 1 tablet (250 mg total) by mouth 4 (four) times daily for 10 days.   Multiple Vitamins-Minerals (PRESERVISION AREDS 2 PO) Take 1 capsule by mouth 2 (two) times daily.    Omega-3 Fatty Acids (FISH OIL) 1000 MG CAPS Take 2 capsules (2,000 mg total) by mouth 2 (two) times daily.   ondansetron (ZOFRAN) 4 MG tablet Take 1 tablet (4 mg total) by mouth every 6 (six) hours. (Patient not taking: Reported on 03/02/2022)   pantoprazole (PROTONIX) 40 MG tablet TAKE ONE TABLET DAILY   rosuvastatin (CRESTOR) 40 MG tablet Take 1 tablet (40 mg total) by mouth daily.   testosterone cypionate (  DEPOTESTOSTERONE CYPIONATE) 200 MG/ML injection Inject into the muscle.   No current facility-administered medications for this visit. (Other)      REVIEW OF SYSTEMS: ROS   Negative for: Constitutional, Gastrointestinal, Neurological, Skin, Genitourinary, Musculoskeletal, HENT, Endocrine, Cardiovascular, Eyes, Respiratory, Psychiatric, Allergic/Imm, Heme/Lymph Last edited by Hurman Horn, MD on 04/08/2022 11:16 AM.       ALLERGIES Allergies  Allergen Reactions   Bee Pollen     Other reaction(s): Cough "sinus issues"   Pollen Extract Cough    "sinus issues"   Sulfamethoxazole Nausea And Vomiting   Sulfamethoxazole-Trimethoprim Rash    PAST MEDICAL HISTORY Past Medical History:  Diagnosis Date   Anxiety    Bladder tumor    Carpal tunnel syndrome, left     intermittant   Chronic seasonal allergic rhinitis    Coronary artery disease    per Dr Aundra Dubin (cardiologist) note --- noted on CT chest coronary calcification -- ETT-Cardiolite normal in 2014   DDD (degenerative disc disease), cervical    Dilated cardiomyopathy ([place])    Diverticulosis of colon    Fecal incontinence    GERD (gastroesophageal reflux disease)    Grade II diastolic dysfunction 97/35/3299   per ECHO    Hemorrhoids    Hiatal hernia    History of adenomatous polyp of colon    2001;  2007;  2012   History of bladder cancer urologist-  dr Gaynelle Arabian   11-06-2013  s/p TURBT and BCG tx's   History of kidney stones    about 45 years ago   History of panic attacks    History of urethral stricture    Hyperlipidemia    Hypertension    Hypogonadism in male    LVH (left ventricular hypertrophy) 06/17/2017   Mild, noted on ECHO   Nonischemic cardiomyopathy [facility])    cardiologist-  dr Aundra Dubin-- per last echo 01/ 2016 ef 50-55%  (up from 09/ 2014 ef 45-50%)   OSA on CPAP    since 2014   Pneumonia 11/2010   Left lower lobe   Pulmonary hypertension ([place]) 06/17/2017   Mild, per ECHO   Past Surgical History:  Procedure Laterality Date   CARDIOVASCULAR STRESS TEST  08-21-2013  dr Aundra Dubin   normal nuclear study w/ no ischemia/  normal LV function and wall motion , ef 63%   CATARACT EXTRACTION W/ INTRAOCULAR LENS  IMPLANT, BILATERAL  2013   COLONOSCOPY  last one 03-03-2016   CYSTO/  DIRECT VISION INTERNAL URETEROTOMY  11/25/2006   LAPAROSCOPIC CHOLECYSTECTOMY  10/15/2006   MENISCUS REPAIR Right 12/2020   ORBITAL FRACTURE SURGERY  1986   left eye:  Jan 1986--- fixation of fractures, repair of optic nerve decompression , and complex closure   TONSILLECTOMY  child   TRANSTHORACIC ECHOCARDIOGRAM  12-16-2014   dr Aundra Dubin   grade 1 diastolic dysfunction, ef 24-26% (improved from last study in 2014 , previous ef 45-50%) /  trivial MR and TR/  mild LAE/  inferior vena cava dilated,  respirophasic diameter changes were blunted (<50%), consistant w/ elevated central venous pressure   TRANSURETHRAL RESECTION OF BLADDER TUMOR Bilateral 05/16/2018   Procedure: TRANSURETHRAL RESECTION OF BLADDER TUMOR (TURBT)/ RETROGRADE;  Surgeon: Festus Aloe, MD;  Location: [facility];  Service: Urology;  Laterality: Bilateral;   TRANSURETHRAL RESECTION OF BLADDER TUMOR WITH GYRUS (TURBT-GYRUS) N/A 11/06/2013   Procedure: TRANSURETHRAL RESECTION OF BLADDER TUMOR WITH GYRUS (TURBT-GYRUS);  Surgeon: Ailene Rud, MD;  Location: [facility];  Service: Urology;  Laterality: N/A;   TRANSURETHRAL RESECTION OF BLADDER TUMOR WITH MITOMYCIN-C N/A 12/09/2016   Procedure: CYSTOSCOPY TRANSURETHRAL RESECTION OF BLADDER TUMOR WITH MITOMYCIN-C;  Surgeon: Carolan Clines, MD;  Location: [facility];  Service: Urology;  Laterality: N/A;   VARICOSE VEIN SURGERY  2002   VASECTOMY      FAMILY HISTORY Family History  Problem Relation Age of Onset   Heart attack Father 76   Stroke Father 39   Heart attack Paternal Grandfather        in 29s   Prostate cancer Paternal Uncle    Heart attack Paternal Uncle         > 56   Heart attack Maternal Uncle 80       > 55   Colon cancer Neg Hx    Diabetes Neg Hx     SOCIAL HISTORY Social History   Tobacco Use   Smoking status: Never   Smokeless tobacco: Never  Vaping Use   Vaping Use: Never used  Substance Use Topics   Alcohol use: Yes    Alcohol/week: 0.0 standard drinks    Comment: occasional   Drug use: No         OPHTHALMIC EXAM:  Base Eye Exam     Visual Acuity (ETDRS)       Right Left   Dist [place] 20/20 20/20 -2         Tonometry (Tonopen, 10:47 AM)       Right Left   Pressure 11 10         Pupils       Pupils Dark Light APD   Right PERRL 4 3 None   Left PERRL 4 3 None         Visual Fields       Left Right    Full Full         Extraocular Movement       Right Left     Full Full         Neuro/Psych     Oriented x3: Yes   Mood/Affect: Normal         Dilation     Both eyes: 1.0% Mydriacyl, 2.5% Phenylephrine @ 10:47 AM           Slit Lamp and Fundus Exam     External Exam       Right Left   External Normal Normal         Slit Lamp Exam       Right Left   Lids/Lashes Normal Normal   Conjunctiva/Sclera White and quiet White and quiet   Cornea Clear Clear   Anterior Chamber Deep and quiet Deep and quiet   Iris Round and reactive Round and reactive   Lens Posterior chamber intraocular lens Posterior chamber intraocular lens   Anterior Vitreous Normal Normal         Fundus Exam       Right Left   Posterior Vitreous Posterior vitreous detachment clear avitric   Disc Peripapillary atrophy Peripapillary atrophy   C/D Ratio 0.5 0.8   Macula Normal Normal   Vessels Normal Normal   Periphery Normal Normal, good retinopexy            IMAGING AND PROCEDURES  Imaging and Procedures for 04/08/22  OCT, Retina - OU - Both Eyes       Right Eye Quality was good. Scan locations included subfoveal. Central Foveal Thickness: 286. Progression has been stable. Findings include  normal foveal contour, myopic contour.   Left Eye Quality was good. Scan locations included subfoveal. Central Foveal Thickness: 321. Progression has been stable. Findings include normal foveal contour, myopic contour.              ASSESSMENT/PLAN:  Posterior vitreous detachment of right eye No holes or tears  History of vitrectomy OS looks great, no holes or tears  Myopic macular degeneration Mild     ICD-10-CM   1. Senile macular retinal degeneration  H35.30 OCT, Retina - OU - Both Eyes    2. Posterior vitreous detachment of right eye  H43.811     3. History of vitrectomy  Z98.890     4. Myopic macular degeneration  H35.30       1.  OU stable.  No signs of retinal holes or tears  2.  The minor vitreous floaters, PVD.  3.  OS  looks great  Ophthalmic Meds Ordered this visit:  No orders of the defined types were placed in this encounter.      Return in about 1 year (around 04/09/2023) for DILATE OU, COLOR FP, OCT.  There are no Patient Instructions on file for this visit.   Explained the diagnoses, plan, and follow up with the patient and they expressed understanding.  Patient expressed understanding of the importance of proper follow up care.   Clent Demark Riku Buttery M.D. Diseases & Surgery of the Retina and Vitreous [facility]ina [facility] [place] 04/08/22     Abbreviations: M myopia (nearsighted); A astigmatism; H hyperopia (farsighted); P presbyopia; Mrx spectacle prescription;  CTL contact lenses; OD right eye; OS left eye; OU both eyes  XT exotropia; ET esotropia; PEK punctate epithelial keratitis; PEE punctate epithelial erosions; DES dry eye syndrome; MGD meibomian gland dysfunction; ATs artificial tears; PFAT's preservative free artificial tears; [place] nuclear sclerotic cataract; PSC posterior subcapsular cataract; ERM epi-retinal membrane; PVD posterior vitreous detachment; RD retinal detachment; DM diabetes mellitus; DR diabetic retinopathy; NPDR non-proliferative diabetic retinopathy; PDR proliferative diabetic retinopathy; CSME clinically significant macular edema; DME diabetic macular edema; dbh dot blot hemorrhages; CWS cotton wool spot; POAG primary open angle glaucoma; C/D cup-to-disc ratio; HVF humphrey visual field; GVF goldmann visual field; OCT optical coherence tomography; IOP intraocular pressure; BRVO Branch retinal vein occlusion; CRVO central retinal vein occlusion; CRAO central retinal artery occlusion; BRAO branch retinal artery occlusion; RT retinal tear; SB scleral buckle; PPV pars plana vitrectomy; VH Vitreous hemorrhage; PRP panretinal laser photocoagulation; IVK intravitreal kenalog; VMT vitreomacular traction; MH Macular hole;  NVD neovascularization of the disc; NVE neovascularization elsewhere;  AREDS age related eye disease study; ARMD age related macular degeneration; POAG primary open angle glaucoma; EBMD epithelial/anterior basement membrane dystrophy; ACIOL anterior chamber intraocular lens; IOL intraocular lens; PCIOL posterior chamber intraocular lens; Phaco/IOL phacoemulsification with intraocular lens placement; [place] photorefractive keratectomy; LASIK laser assisted in situ keratomileusis; HTN hypertension; DM diabetes mellitus; COPD chronic obstructive pulmonary disease

## 2022-04-08 NOTE — Assessment & Plan Note (Signed)
OS looks great, no holes or tears

## 2022-04-08 NOTE — Assessment & Plan Note (Signed)
Mild.

## 2022-04-08 NOTE — Assessment & Plan Note (Signed)
No holes or tears 

## 2022-04-13 DIAGNOSIS — R29898 Other symptoms and signs involving the musculoskeletal system: Secondary | ICD-10-CM | POA: Diagnosis not present

## 2022-04-13 DIAGNOSIS — Z96651 Presence of right artificial knee joint: Secondary | ICD-10-CM | POA: Diagnosis not present

## 2022-04-13 DIAGNOSIS — M1711 Unilateral primary osteoarthritis, right knee: Secondary | ICD-10-CM | POA: Diagnosis not present

## 2022-04-13 DIAGNOSIS — Z789 Other specified health status: Secondary | ICD-10-CM | POA: Diagnosis not present

## 2022-04-13 DIAGNOSIS — Z7409 Other reduced mobility: Secondary | ICD-10-CM | POA: Diagnosis not present

## 2022-04-13 DIAGNOSIS — M25561 Pain in right knee: Secondary | ICD-10-CM | POA: Diagnosis not present

## 2022-04-15 DIAGNOSIS — Z96651 Presence of right artificial knee joint: Secondary | ICD-10-CM | POA: Diagnosis not present

## 2022-04-15 DIAGNOSIS — R29898 Other symptoms and signs involving the musculoskeletal system: Secondary | ICD-10-CM | POA: Diagnosis not present

## 2022-04-15 DIAGNOSIS — Z789 Other specified health status: Secondary | ICD-10-CM | POA: Diagnosis not present

## 2022-04-15 DIAGNOSIS — Z7409 Other reduced mobility: Secondary | ICD-10-CM | POA: Diagnosis not present

## 2022-04-15 DIAGNOSIS — M25561 Pain in right knee: Secondary | ICD-10-CM | POA: Diagnosis not present

## 2022-04-15 DIAGNOSIS — M1711 Unilateral primary osteoarthritis, right knee: Secondary | ICD-10-CM | POA: Diagnosis not present

## 2022-04-21 DIAGNOSIS — M1711 Unilateral primary osteoarthritis, right knee: Secondary | ICD-10-CM | POA: Diagnosis not present

## 2022-04-21 DIAGNOSIS — M25561 Pain in right knee: Secondary | ICD-10-CM | POA: Diagnosis not present

## 2022-04-21 DIAGNOSIS — Z7409 Other reduced mobility: Secondary | ICD-10-CM | POA: Diagnosis not present

## 2022-04-21 DIAGNOSIS — Z789 Other specified health status: Secondary | ICD-10-CM | POA: Diagnosis not present

## 2022-04-21 DIAGNOSIS — Z96651 Presence of right artificial knee joint: Secondary | ICD-10-CM | POA: Diagnosis not present

## 2022-04-21 DIAGNOSIS — R29898 Other symptoms and signs involving the musculoskeletal system: Secondary | ICD-10-CM | POA: Diagnosis not present

## 2022-04-22 DIAGNOSIS — Z96651 Presence of right artificial knee joint: Secondary | ICD-10-CM | POA: Diagnosis not present

## 2022-04-22 DIAGNOSIS — N400 Enlarged prostate without lower urinary tract symptoms: Secondary | ICD-10-CM | POA: Diagnosis not present

## 2022-04-22 DIAGNOSIS — M1711 Unilateral primary osteoarthritis, right knee: Secondary | ICD-10-CM | POA: Diagnosis not present

## 2022-04-22 DIAGNOSIS — E291 Testicular hypofunction: Secondary | ICD-10-CM | POA: Diagnosis not present

## 2022-04-22 DIAGNOSIS — N3289 Other specified disorders of bladder: Secondary | ICD-10-CM | POA: Diagnosis not present

## 2022-04-22 DIAGNOSIS — M25561 Pain in right knee: Secondary | ICD-10-CM | POA: Diagnosis not present

## 2022-04-22 DIAGNOSIS — Z789 Other specified health status: Secondary | ICD-10-CM | POA: Diagnosis not present

## 2022-04-22 DIAGNOSIS — Z7409 Other reduced mobility: Secondary | ICD-10-CM | POA: Diagnosis not present

## 2022-04-22 DIAGNOSIS — R29898 Other symptoms and signs involving the musculoskeletal system: Secondary | ICD-10-CM | POA: Diagnosis not present

## 2022-04-22 DIAGNOSIS — C672 Malignant neoplasm of lateral wall of bladder: Secondary | ICD-10-CM | POA: Diagnosis not present

## 2022-04-26 DIAGNOSIS — M1711 Unilateral primary osteoarthritis, right knee: Secondary | ICD-10-CM | POA: Diagnosis not present

## 2022-04-26 DIAGNOSIS — Z7409 Other reduced mobility: Secondary | ICD-10-CM | POA: Diagnosis not present

## 2022-04-26 DIAGNOSIS — Z96651 Presence of right artificial knee joint: Secondary | ICD-10-CM | POA: Diagnosis not present

## 2022-04-26 DIAGNOSIS — R29898 Other symptoms and signs involving the musculoskeletal system: Secondary | ICD-10-CM | POA: Diagnosis not present

## 2022-04-26 DIAGNOSIS — M25561 Pain in right knee: Secondary | ICD-10-CM | POA: Diagnosis not present

## 2022-04-26 DIAGNOSIS — Z789 Other specified health status: Secondary | ICD-10-CM | POA: Diagnosis not present

## 2022-04-29 DIAGNOSIS — R29898 Other symptoms and signs involving the musculoskeletal system: Secondary | ICD-10-CM | POA: Diagnosis not present

## 2022-04-29 DIAGNOSIS — Z789 Other specified health status: Secondary | ICD-10-CM | POA: Diagnosis not present

## 2022-04-29 DIAGNOSIS — Z7409 Other reduced mobility: Secondary | ICD-10-CM | POA: Diagnosis not present

## 2022-04-29 DIAGNOSIS — Z96651 Presence of right artificial knee joint: Secondary | ICD-10-CM | POA: Diagnosis not present

## 2022-04-29 DIAGNOSIS — M25561 Pain in right knee: Secondary | ICD-10-CM | POA: Diagnosis not present

## 2022-04-29 DIAGNOSIS — M1711 Unilateral primary osteoarthritis, right knee: Secondary | ICD-10-CM | POA: Diagnosis not present

## 2022-05-04 DIAGNOSIS — Z7409 Other reduced mobility: Secondary | ICD-10-CM | POA: Diagnosis not present

## 2022-05-04 DIAGNOSIS — M1711 Unilateral primary osteoarthritis, right knee: Secondary | ICD-10-CM | POA: Diagnosis not present

## 2022-05-04 DIAGNOSIS — M25561 Pain in right knee: Secondary | ICD-10-CM | POA: Diagnosis not present

## 2022-05-04 DIAGNOSIS — Z789 Other specified health status: Secondary | ICD-10-CM | POA: Diagnosis not present

## 2022-05-04 DIAGNOSIS — Z96651 Presence of right artificial knee joint: Secondary | ICD-10-CM | POA: Diagnosis not present

## 2022-05-04 DIAGNOSIS — R29898 Other symptoms and signs involving the musculoskeletal system: Secondary | ICD-10-CM | POA: Diagnosis not present

## 2022-05-07 DIAGNOSIS — H401131 Primary open-angle glaucoma, bilateral, mild stage: Secondary | ICD-10-CM | POA: Diagnosis not present

## 2022-05-07 DIAGNOSIS — H524 Presbyopia: Secondary | ICD-10-CM | POA: Diagnosis not present

## 2022-05-07 DIAGNOSIS — H52203 Unspecified astigmatism, bilateral: Secondary | ICD-10-CM | POA: Diagnosis not present

## 2022-05-07 DIAGNOSIS — Z961 Presence of intraocular lens: Secondary | ICD-10-CM | POA: Diagnosis not present

## 2022-05-10 DIAGNOSIS — E291 Testicular hypofunction: Secondary | ICD-10-CM | POA: Diagnosis not present

## 2022-05-11 DIAGNOSIS — M25561 Pain in right knee: Secondary | ICD-10-CM | POA: Diagnosis not present

## 2022-05-11 DIAGNOSIS — R29898 Other symptoms and signs involving the musculoskeletal system: Secondary | ICD-10-CM | POA: Diagnosis not present

## 2022-05-13 ENCOUNTER — Other Ambulatory Visit: Payer: Self-pay | Admitting: Neurology

## 2022-05-13 ENCOUNTER — Ambulatory Visit: Payer: PPO | Admitting: Neurology

## 2022-05-13 ENCOUNTER — Encounter: Payer: Self-pay | Admitting: Neurology

## 2022-05-13 VITALS — BP 122/68 | HR 63 | Ht 75.0 in | Wt 273.0 lb

## 2022-05-13 DIAGNOSIS — G4730 Sleep apnea, unspecified: Secondary | ICD-10-CM

## 2022-05-13 DIAGNOSIS — H401192 Primary open-angle glaucoma, unspecified eye, moderate stage: Secondary | ICD-10-CM

## 2022-05-13 DIAGNOSIS — G3184 Mild cognitive impairment, so stated: Secondary | ICD-10-CM

## 2022-05-13 DIAGNOSIS — M25561 Pain in right knee: Secondary | ICD-10-CM | POA: Diagnosis not present

## 2022-05-13 DIAGNOSIS — G4733 Obstructive sleep apnea (adult) (pediatric): Secondary | ICD-10-CM

## 2022-05-13 DIAGNOSIS — R29898 Other symptoms and signs involving the musculoskeletal system: Secondary | ICD-10-CM | POA: Diagnosis not present

## 2022-05-13 MED ORDER — DONEPEZIL HCL 10 MG PO TBDP: 10.0000 mg | ORAL_TABLET | Freq: Every day | ORAL | 3 refills | Status: DC

## 2022-05-13 NOTE — Progress Notes (Signed)
[facility]   Provider:  Larey Seat, [place] D  Referring Provider: Prince Solian, MD Primary Care Physician:  Prince Solian, MD  Chief Complaint  Patient presents with   Follow-up    Pt alone, rm 11. Overall doing well. Recently had a knee surgery, -24-2023. Sleep and pain interference for a while, now improved.   CPAP is stable. He has [place] and is interested in transfering to another DME. Current is [facility]. Will tx to [facility] since insurance will allow.   Pt states overall memory is stable and not worsening. He did follow with Dr Sima Matas.      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 His knee surgery was at [facility], Dr Janina Mayo, and now PT at [facility] [place].  Dr Sima Matas tested him for cognitive deficits as mild degree, but with a significant decrease in comparison to his premorbid baseline. .   08-31-2021. Brian Cortez is a 71 y.o. male, He is doing well on CPAP. Has felt that memory has been better on Aricept. Rectal incontinence, GI upset. Metamucil helps. Has been happy with memory improvement. See MOCA improved from 22/ 30 to 25 / 30.  [name] has been 100% compliant CPAP user over the last 30 days with 100% compliance 4 hours as well.  Average user time is 8 hours and 25 minutes.  AutoSet between 5 and 15 cmH2O with 3 cm EPR.  Residual AHI is 1.3.  His 95th percentile pressure is 11.9 and his 95th percentile air leak is 12.4 both are in good shape.  Central apneas have not arisen and the air leak seems to be higher on Sundays or Saturdays, when he had a bit of alcohol intake.   Today is 27 May 2021 and I have the pleasure of meeting today with Brian Cortez , who is a 71 y.o. male and seen today for MCI and CPAP  compliance.  He has  developed Planter fasciitis and significant enough knee pain to consider a total knee replacement in the near future.  This will be on the right.  While we have met for many years now following his CPAP compliance which has always been excellent he again unsurprisingly, has again in 100% compliance with an average hour 8-hour 54-minute nightly use of CPAP uses an AutoSet with 5 to 15 cm water pressure and 3 cm EPR, residual AHI 0.9 which is excellent.  No central apneas arousing.  Pressure at the 95th percentile is 11.3 well within the current pressure settings so no changes have to be made and he does not have major air leakage either his Epworth score was endorsed to his geriatric depression score was endorsed at only one-point and we also performed a [facility] Montreal cognitive assessment test today.  Today he scored 23 out of 30 points which is a significant decrease to 4 months ago when he was at 27 out of 30 points.  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  He had only 1 out of 5 steps correct.  So he carried through the calculation error from step to.  He had good attention naming and he was able to do a drawing of his three-dimensional cube.  He did put the  hands of the clock correctly.  And the numbers.  Contour was intact.  He did for the first time now have trouble with the Trail Making Test. He reports no trouble with attention- he started Prevagen and had no effect. He will now start  Aricept 5 mg daily and if no side effect will increase to 10 mg a day po.  Normal brain MRI.       01-26-2021: Brian Cortez is a 71 y.o. male and seen for a new and different problem today, regarding subjective memory loss. He has been sleeping well, breathing well and is  He is very concerned about his short memory. He reports trouble with name finding. He couldn't remember the name of a [facility] he attended last week- friends with  Gershon Crane.  We will do a MOCA today- he started off with visio-spatial and executive dysfunction. His CPAP compliance plays a secondary role today- 100 % compliance and residual AHI 1.5/h. will need no adjustment.     seen on 05-21-2020.  Brian Cortez was in need of a new CPAP machine he was evaluated for his current baseline by home sleep test performed on January 23, 2020.  He has been followed here for CPAP compliance for over 5 years.  Fully vaccinated, no nocturia, treated for bladder cancer.  Surprisingly mild obstructive sleep apnea was this time found at an AHI of 9.4/h but strongly REM dependent.  During REM sleep his AHI was 20.6 associated with moderate snoring there was also a bradycardia trend and prolonged hypoxemic intervals.  My recommendation was to start a new CPAP machine with auto titration measures as hypoxemia and REM dependence cannot be addressed in a dental device for inspire device.  The patient has been 100% compliant with his new device a minimum pressure of 5 maximum pressure of 15 cmH2O with 3 cm EPR the AHI was 0.8/h which is an excellent resolution of apnea.  This is a 90% reduction of his apnea count in comparison to his new baseline.  He has mild to moderate air leakage his pressure at the 95th percentile is 11.3 cmH2O and well within the current set limits.  I do not need to make any changes unless the patient has any discomfort with the interface.  Otherwise I encouraged him to continue his compliant use of CPAP. He reports his water reservoir is almost empty after a night of use.  He no longer uses an ozone cleaner - not ever since he has a new machine.  The patient endorsed the Epworth sleepiness score today at 3 points only the fatigue severity score at 17 points, the geriatric depression score at 0 points. He continues to play golf he is watching out to prevent dehydration.     12-19-2019, he follows Dr. Dagmar Hait for PCP.  He follows for CPAP compliance. His machine  is now 71 years old and he needs a new one. He has not contracted Covid to his knowledge , he got the first shot last Thursday.  His diagnosis is OSA- allergic rhinitis - obesity- pneumonia- he no longer has nocturia.    Last seen here as a revisit on 09-20-2018, At the pleasure of meeting today with Brian Cortez, and meanwhile retired 71 year old Caucasian married male patient who has been followed here for CPAP needs since 2013 of 14.  He continues to be highly compliant CPAP patient, his average use of time of 7 hours 7 minutes for this 30-day.  And his compliance was 87%, residual AHi at 1.5/h. Uses an S9 Autoset , set at 8 cm , 2 cm EPR.  He was not able to use his CPAP on 5 days due to illness, he has learned of a return of his bladder cancer by cystoscopy- Biopsy Dr. Lawerance Bach - and needed additional work-up and initiation of chemotherapy.  09-14-2017,Brian Cortez is seen here today for a yearly revisit. He has been very compliant CPAP user with 5% compliance averaging 8 hours and 23 minutes at night set pressure is 8 cm water with 27 cm EPR, residual AHI is 0.8 he does not have major air leaks in the low residual AHI is equally distributed between central and obstructive events. He has lost weight visibly. He endorsed the Epworth sleepiness score at 2 points the fatigue severity score at 11 points the geriatric depression score at 1 out of 15 points. He is retired, he travels and enjoys golf. He is taking Administrator.    Fatigue and lightheadedness were improved, until last month. He has no longer nocturia since being on CPAP. In 2016 he went to the ED and was diagnosed with diastolic heart failure, had a nuclear test with Dr. Loralie Champagne - improved on Coreg.  His medical history has changed drastically over the last 12 month, he suffered a congestive heart failure episode and has been controlled by Coreg. His bladder cancer was discovered in Dec 2014. Underwent BCG treatment. Has a  30% re-occurrence risk. He had pneumonia and changed PCPs. The data collection began on 07-28-14 and ended on 08-26-14 CPAP is used at 8 cm water pressure with 2 cm EPR the residual AHI is 1.6 which is excellent,  the patient uses a machine on average 8 hours and 24 minutes at night and has 100% compliance for nightly use  over 4 hours. Median daily usage 8 hours 11 minutes.  Interval history history from 09-09-15, the patient's 100% compliant for the number of 30 days and each of these days over 4 hours of consecutive use has been registered. On average he uses his machine for 8 hours and 47 minutes set pressure is 8 cm water with 2 cm expiratory pressure relief ( EPR) the residual AHI is 1.4 he has minimal air leaks, his Epworth sleepiness score today is 1. fatigue severity 6 points and the geriatric depression score is endorsed at one point.  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 When I saw him last year he had been suffering from pneumonia and his primary care physician, Dr. Elsworth Soho, had treated him. The patient is a bladder cancer survivor and has been diagnosed with diastolic heart failure. His cardiac function had improved on Coreg. I have followed him for obstructive sleep apnea and he is a CPAP compliant patient with 100% of use of his CPAP at average use of 8 hours and 54 minutes nightly. CPAP is set at 8 cm water pressure this 2 cm EPR and a residual AHI of 1.4. Minor air leaks are noted, these have no clinical bearing.    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  Sleeping better, SOB today, coughing. has ciliary injection, hay fever.  Severe  allergic  rhinitis. Obese. Has not had recurrence of  Nocturia. Hot flushes after d/c testosterone supplements.  How likely are you to doze in the following situations: 0 = not likely, 1 = slight chance, 2 = moderate chance, 3 = high chance  Sitting and Reading? 1 Watching Television?1 Sitting inactive in a public place (theater or meeting)? Lying down in the afternoon when circumstances permit?1 Sitting and talking to someone? Sitting quietly after lunch without alcohol?  In a car, while stopped for a few minutes in traffic? As a passenger in a car for an hour without a break?  Total =from previously 10 /24 now still  to 3/ 24 points.   FSS 15/ 63     05/13/2022    9:29 AM 08/31/2021    1:15 PM 05/27/2021    1:28 PM  Montreal Cognitive Assessment   Visuospatial/ Executive (0/5) 5 4 3   Naming (0/3) 3 3 3   Attention: Read list of digits (0/2) 1 1 2   Attention: Read list of letters (0/1) 1 1 1   Attention: Serial 7 subtraction starting at 100 (0/3) 3 2 2   Language: Repeat phrase (0/2) 2 2 0  Language : Fluency (0/1) 1 1 1   Abstraction (0/2) 2 2 2   Delayed Recall (0/5) 5 3 2   Orientation (0/6) 6 6 6   Total 29 25 22         He is finished  6 chemotherapy rounds in 2020.  Bladder cancer had returned, this has affected his fatigue which was endorsed at 35 out of 63 possible points, but he has not had excessive daytime sleepiness and endorsed only 2 out of 24 points.   He also is not clinically depressed geriatric depression score endorsed at 2 out of 15 points.  He is worried about memory - has survived cancer, has glaucoma and had cataract surgery. today is 01-26-2021.      Social History   Socioeconomic History   Marital status: Married    Spouse name: Pamala Hurry   Number of children: 2   Years of education: Post Grad   Highest education level: Not on file  Occupational History   Occupation: Boling    Employer: [employer]   Occupation: PRESIDENT    Employer: [employer]  Tobacco Use   Smoking  status: Never Smoker   Smokeless tobacco: Never Used  Scientific laboratory technician Use: Never used  Substance and Sexual Activity   Alcohol use: Yes    Alcohol/week: 0.0 standard drinks    Comment: occasional   Drug use: No   Sexual activity: Not on file    Comment: VASECTOMY  Other Topics Concern   Not on file  Social History Narrative   Patient lives at home with spouse.    Caffeine Use: 3-4 cups   Social Determinants of Health   Financial Resource Strain: Not on file  Food Insecurity: Not on file  Transportation Needs: Not on file  Physical Activity: Not on file  Stress: Not on file  Social Connections: Not on file  Intimate Partner Violence: Not on file    Family History  Problem Relation Age of Onset   Heart attack Father 2   Stroke Father 27   Heart attack Paternal Grandfather        in 40s   Prostate cancer Paternal Uncle    Heart attack Paternal Uncle         > 55   Heart attack Maternal Uncle 80       >  59   Colon cancer Neg Hx    Diabetes Neg Hx     Past Medical History:  Diagnosis Date   Anxiety    Bladder tumor    Carpal tunnel syndrome, left    intermittant   Chronic seasonal allergic rhinitis    Coronary artery disease    per Dr Aundra Dubin (cardiologist) note --- noted on CT chest coronary calcification -- ETT-Cardiolite normal in 2014   DDD (degenerative disc disease), cervical    Dilated cardiomyopathy ([place])    Diverticulosis of colon    Fecal incontinence    GERD (gastroesophageal reflux disease)    Grade II diastolic dysfunction 42/70/6237   per ECHO    Hemorrhoids    Hiatal hernia    History of adenomatous polyp of colon    2001;  2007;  2012   History of bladder cancer urologist-  dr Gaynelle Arabian   11-06-2013  s/p TURBT and BCG tx's   History of kidney stones    about 45 years ago   History of panic attacks    History of urethral stricture    Hyperlipidemia    Hypertension    Hypogonadism in male    LVH (left ventricular hypertrophy)  06/17/2017   Mild, noted on ECHO   Nonischemic cardiomyopathy [facility])    cardiologist-  dr Aundra Dubin-- per last echo 01/ 2016 ef 50-55%  (up from 09/ 2014 ef 45-50%)   OSA on CPAP    since 2014   Pneumonia 11/2010   Left lower lobe   Pulmonary hypertension ([place]) 06/17/2017   Mild, per ECHO    Past Surgical History:  Procedure Laterality Date   CARDIOVASCULAR STRESS TEST  08-21-2013  dr Aundra Dubin   normal nuclear study w/ no ischemia/  normal LV function and wall motion , ef 63%   CATARACT EXTRACTION W/ INTRAOCULAR LENS  IMPLANT, BILATERAL  2013   COLONOSCOPY  last one 03-03-2016   CYSTO/  DIRECT VISION INTERNAL URETEROTOMY  11/25/2006   LAPAROSCOPIC CHOLECYSTECTOMY  10/15/2006   MENISCUS REPAIR Right 12/2020   ORBITAL FRACTURE SURGERY  1986   left eye:  Jan 1986--- fixation of fractures, repair of optic nerve decompression , and complex closure   TONSILLECTOMY  child   TRANSTHORACIC ECHOCARDIOGRAM  12-16-2014   dr Aundra Dubin   grade 1 diastolic dysfunction, ef 62-83% (improved from last study in 2014 , previous ef 45-50%) /  trivial MR and TR/  mild LAE/  inferior vena cava dilated, respirophasic diameter changes were blunted (<50%), consistant w/ elevated central venous pressure   TRANSURETHRAL RESECTION OF BLADDER TUMOR Bilateral 05/16/2018   Procedure: TRANSURETHRAL RESECTION OF BLADDER TUMOR (TURBT)/ RETROGRADE;  Surgeon: Festus Aloe, MD;  Location: [place];  Service: Urology;  Laterality: Bilateral;   TRANSURETHRAL RESECTION OF BLADDER TUMOR WITH GYRUS (TURBT-GYRUS) N/A 11/06/2013   Procedure: TRANSURETHRAL RESECTION OF BLADDER TUMOR WITH GYRUS (TURBT-GYRUS);  Surgeon: Ailene Rud, MD;  Location: [facility];  Service: Urology;  Laterality: N/A;   TRANSURETHRAL RESECTION OF BLADDER TUMOR WITH MITOMYCIN-C N/A 12/09/2016   Procedure: CYSTOSCOPY TRANSURETHRAL RESECTION OF BLADDER TUMOR WITH MITOMYCIN-C;  Surgeon: Carolan Clines, MD;  Location: [place];  Service: Urology;  Laterality: N/A;   VARICOSE VEIN SURGERY  2002   VASECTOMY      Current Outpatient Medications  Medication Sig Dispense Refill   allopurinol (ZYLOPRIM) 300 MG tablet Take 300 mg by mouth daily.     anastrozole (ARIMIDEX) 1 MG tablet Take 2 mg  by mouth daily.     aspirin 81 MG EC tablet Take 1 tablet by mouth daily.     bimatoprost (LUMIGAN) 0.01 % SOLN INSTILL ONE DROP IN [facility] EYE AT BEDTIME     donepezil (ARICEPT ODT) 10 MG disintegrating tablet Take 1 tablet (10 mg total) by mouth at bedtime. 90 tablet 3   escitalopram (LEXAPRO) 20 MG tablet Take 20 mg by mouth every morning.      fenofibrate 160 MG tablet Take 1 tablet (160 mg total) by mouth daily. 90 tablet 3   fexofenadine (ALLEGRA) 180 MG tablet Take 1 tablet by mouth as needed.     hydrocortisone (ANUSOL-HC) 2.5 % rectal cream Place 1 application rectally 2 (two) times daily. 30 g 6   icosapent Ethyl (VASCEPA) 1 g capsule Take 2 capsules (2 g total) by mouth 2 (two) times daily. 360 capsule 3   Multiple Vitamins-Minerals (PRESERVISION AREDS 2 PO) Take 1 capsule by mouth 2 (two) times daily.      ondansetron (ZOFRAN) 4 MG tablet Take 1 tablet (4 mg total) by mouth every 6 (six) hours. (Patient not taking: Reported on 03/02/2022) 12 tablet 0   pantoprazole (PROTONIX) 40 MG tablet TAKE ONE TABLET DAILY 90 tablet 3   rosuvastatin (CRESTOR) 40 MG tablet Take 1 tablet (40 mg total) by mouth daily. 90 tablet 3   testosterone cypionate (DEPOTESTOSTERONE CYPIONATE) 200 MG/ML injection Inject into the muscle.     No current facility-administered medications for this visit.    Allergies as of 05/13/2022 - Review Complete 05/13/2022  Allergen Reaction Noted   Bee pollen  01/13/2013   Pollen extract Cough 01/13/2013   Sulfamethoxazole Nausea And Vomiting 04/30/2021   Sulfamethoxazole-trimethoprim Rash 11/20/2018    Vitals: BP 122/68   Pulse 63   Ht 6' 3"  (1.905 m)   Wt 273 lb (123.8 kg)   BMI 34.12 kg/m   Last Weight:  Wt Readings from Last 1 Encounters:  05/13/22 273 lb (123.8 kg)   Last Height:   Ht Readings from Last 1 Encounters:  05/13/22 6' 3"  (1.905 m)     General: The patient is awake, alert and appears  in acute distress from hay fever and sinusitis. Nasal congestion is noted.  The patient is well groomed. He has visible pressure marks.  Head: Normocephalic, atraumatic. Neck is supple. Circumference at 19- inches   Circumference, from 21" .   Mallampati 3 plus - large neck, small opening   Sinus congestion.  Retrognathia is noted .  Cardiovascular: Regular rate and rhythm, without murmurs or carotid bruit, and without distended neck veins. Respiratory: Lungs are clear to auscultation. He has faster RR 23- min.  Skin:   Ankle edema, affecting the ankle sensation for vibration.  Varicose veins visible in both lower extremities.  Trunk: BMI reduced to 33.87,  he has an erect posture.  Neurologic exam : The patient is awake and alert, oriented to place and time.   Memory subjective  described as improved     05/13/2022    9:29 AM 08/31/2021    1:15 PM 05/27/2021    1:28 PM  Montreal Cognitive Assessment   Visuospatial/ Executive (0/5) 5 4 3   Naming (0/3) 3 3 3   Attention: Read list of digits (0/2) 1 1 2   Attention: Read list of letters (0/1) 1 1 1   Attention: Serial 7 subtraction starting at 100 (0/3) 3 2 2   Language: Repeat phrase (0/2) 2 2 0  Language : Fluency (0/1)  1 1 1   Abstraction (0/2) 2 2 2   Delayed Recall (0/5) 5 3 2   Orientation (0/6) 6 6 6   Total 29 25 22     Cranial nerves: no loss of smell or taste- Pupils are equally round and reactive to light.  Status post cataract.  Fully vaccinated.  Nystagmus with gaze to the left , unrestricted visual field.  Facial motor strength is symmetric and his tongue and uvula move midline. Hearing intact.  DTR intact, symmetrical. Babinski downgoing.   Assessment:  30 minutes; After physical and neurologic examination,  review of laboratory studies, imaging, neurophysiology testing and pre-existing records, assessment will be reviewed on the problem list:  Memory deficit is present, but is more significant impaired in comparison to premorbid baseline pre retirement  .-  MOCA at 29 out of 30.   Further OSA, obesity, cardiomyopathy, bladder cancer, repeated infections- Sinusitis, bronchitis, Pneumonia.   Plan:   1) VISIOSPATIAL deficit on MOCA , now also trail making test- 22/ 30 MOCA first visit, 25/ 30 and 29/30 today. On ARICEPT- He continues reporting amnestic spells - "pullling a blank" , sometimes there is a delay. Naming people. Not getting lost, not having trouble to file taxes,  but calculation was off this time- financial affairs. He has trouble on the golf course.  I think he can develop Brian dementia- even with normal MRI, normal CMET.  I was glad to see the results of neurocognitive testing. Dr Sima Matas, PhD, found a low-moderate impairment..  I started Aricept 10 mg a day, he has felt his memory is supported better, and he has GI side effects that are manageable, will continue.    2) OSA - 100% compliance for the last 5 years, on CPAP well controlled.  Now on new machine the same reciprocal results. AHI was 1.3/h - keep calm and carry on !  Continue on these settings.   He has noted better BP control . Better glaucoma pressures, Dr Zadie Rhine, MD   Dr. Radford Pax ( cardiology) is following orthostatic hypotension, HTN and cardiomyopathy. On arymidex. Triglyceride elevated-  alcohol related?   Larey Seat, MD      Cc:  Dr Dagmar Hait.

## 2022-05-13 NOTE — Patient Instructions (Addendum)
There are well-accepted and sensible ways to reduce risk for Alzheimers disease and other degenerative brain disorders .  Exercise Daily Walk A daily 20 minute walk should be part of your routine. Disease related apathy can be a significant roadblock to exercise and the only way to overcome this is to make it a daily routine and perhaps have a reward at the end (something your loved one loves to eat or drink perhaps) or a personal trainer coming to the home can also be very useful. Most importantly, the patient is much more likely to exercise if the caregiver / spouse does it with him/her. In general a structured, repetitive schedule is best.  General Health: Any diseases which effect your body will effect your brain such as a pneumonia, urinary infection, blood clot, heart attack or stroke. Keep contact with your primary care doctor for regular follow ups.  Sleep. A good nights sleep is healthy for the brain. Seven hours is recommended. If you have insomnia or poor sleep habits we can give you some instructions. If you have sleep apnea wear your mask.  Diet: Eating a heart healthy diet is also a good idea; fish and poultry instead of red meat, nuts (mostly non-peanuts), vegetables, fruits, olive oil or canola oil (instead of butter), minimal salt (use other spices to flavor foods), whole grain rice, bread, cereal and pasta and wine in moderation.Research is now showing that the MIND diet, which is a combination of The Mediterranean diet and the DASH diet, is beneficial for cognitive processing and longevity. Information about this diet can be found in The MIND Diet, a book by Maggie Moon, MS, RDN, and online at https://www.healthline.com/nutrition/mind-diet  Finances, Power of Attorney and Advance Directives: You should consider putting legal safeguards in place with regard to financial and medical decision making. While the spouse always has power of attorney for medical and financial issues in the  absence of any form, you should consider what you want in case the spouse / caregiver is no longer around or capable of making decisions.   The Alzheimers Association Position on Disease Prevention  Can Alzheimer's be prevented? It's a question that continues to intrigue researchers and fuel new investigations. There are no clear-cut answers yet -- partially due to the need for more large-scale studies in diverse populations -- but promising research is under way. The Alzheimer's Association is leading the worldwide effort to find a treatment for Alzheimer's, delay its onset and prevent it from developing.   What causes Alzheimer's? Experts agree that in the vast majority of cases, Alzheimer's, like other common chronic conditions, probably develops as a result of complex interactions among multiple factors, including age, genetics, environment, lifestyle and coexisting medical conditions. Although some risk factors -- such as age or genes -- cannot be changed, other risk factors -- such as high blood pressure and lack of exercise -- usually can be changed to help reduce risk. Research in these areas may lead to new ways to detect those at highest risk.  Prevention studies A small percentage of people with Alzheimer's disease (less than 1 percent) have an early-onset type associated with genetic mutations. Individuals who have these genetic mutations are guaranteed to develop the disease. An ongoing clinical trial conducted by the [facility] ([facility]), is testing whether antibodies to beta-amyloid can reduce the accumulation of beta-amyloid plaque in the brains of people with such genetic mutations and thereby reduce, delay or prevent symptoms. Participants in the trial are receiving antibodies (  or placebo) before they develop symptoms, and the development of beta-amyloid plaques is being monitored by brain scans and other tests.  Another clinical trial, known as the A4 trial  (Anti-Amyloid Treatment in Asymptomatic Alzheimer's), is testing whether antibodies to beta-amyloid can reduce the risk of Alzheimer's disease in older people (ages 65 to 85) at high risk for the disease. The A4 trial is being conducted by the Alzheimer's Disease Cooperative Study.  Though research is still evolving, evidence is strong that people can reduce their risk by making key lifestyle changes, including participating in regular activity and maintaining good heart health. Based on this research, the Alzheimer's Association offers 10 Ways to Love Your Brain -- a collection of tips that can reduce the risk of cognitive decline.  Heart-head connection  New research shows there are things we can do to reduce the risk of mild cognitive impairment and dementia.  Several conditions known to increase the risk of cardiovascular disease -- such as high blood pressure, diabetes and high cholesterol -- also increase the risk of developing Alzheimer's. Some autopsy studies show that as many as 80 percent of individuals with Alzheimer's disease also have cardiovascular disease.  A longstanding question is why some people develop hallmark Alzheimer's plaques and tangles but do not develop the symptoms of Alzheimer's. Vascular disease may help researchers eventually find an answer. Some autopsy studies suggest that plaques and tangles may be present in the brain without causing symptoms of cognitive decline unless the brain also shows evidence of vascular disease. More research is needed to better understand the link between vascular health and Alzheimer's.  Physical exercise and diet Regular physical exercise may be a beneficial strategy to lower the risk of Alzheimer's and vascular dementia. Exercise may directly benefit brain cells by increasing blood and oxygen flow in the brain. Because of its known cardiovascular benefits, a medically approved exercise program is a valuable part of any overall wellness  plan.  Current evidence suggests that heart-healthy eating may also help protect the brain. Heart-healthy eating includes limiting the intake of sugar and saturated fats and making sure to eat plenty of fruits, vegetables, and whole grains. No one diet is best. Two diets that have been studied and may be beneficial are the DASH (Dietary Approaches to Stop Hypertension) diet and the Mediterranean diet. The DASH diet emphasizes vegetables, fruits and fat-free or low-fat dairy products; includes whole grains, fish, poultry, beans, seeds, nuts and vegetable oils; and limits sodium, sweets, sugary beverages and red meats. A Mediterranean diet includes relatively little red meat and emphasizes whole grains, fruits and vegetables, fish and shellfish, and nuts, olive oil and other healthy fats.  Social connections and intellectual activity A number of studies indicate that maintaining strong social connections and keeping mentally active as we age might lower the risk of cognitive decline and Alzheimer's. Experts are not certain about the reason for this association. It may be due to direct mechanisms through which social and mental stimulation strengthen connections between nerve cells in the brain.  Head trauma There appears to be a strong link between future risk of Alzheimer's and serious head trauma, especially when injury involves loss of consciousness. You can help reduce your risk of Alzheimer's by protecting your head.  Wear a seat belt  Use a helmet when participating in sports  "Fall-proof" your home   What you can do now While research is not yet conclusive, certain lifestyle choices, such as physical activity and diet, may help support brain   health and prevent Alzheimer's. Many of these lifestyle changes have been shown to lower the risk of other diseases, like heart disease and diabetes, which have been linked to Alzheimer's. With few drawbacks and plenty of known benefits, healthy lifestyle  choices can improve your health and possibly protect your brain.  Learn more about brain health. You can help increase our knowledge by considering participation in a clinical study. Our free clinical trial matching services, TrialMatch, can help you find clinical trials in your area that are seeking volunteers.  Understanding prevention research Here are some things to keep in mind about the research underlying much of our current knowledge about possible prevention:  Insights about potentially modifiable risk factors apply to large population groups, not to individuals. Studies can show that factor X is associated with outcome Y, but cannot guarantee that any specific person will have that outcome. As a result, you can "do everything right" and still have a serious health problem or "do everything wrong" and live to be 100.  Much of our current evidence comes from large epidemiological studies such as the [place] Aging Study, the Nurses' Health Study, the Adult Changes in Thought Study and the [employer] These studies explore pre-existing behaviors and use statistical methods to relate those behaviors to health outcomes. This type of study can show an "association" between a factor and an outcome but cannot "prove" cause and effect. This is why we describe evidence based on these studies with such language as "suggests," "may show," "might protect," and "is associated with."  The gold standard for showing cause and effect is a clinical trial in which participants are randomly assigned to a prevention or risk management strategy or a control group. Researchers follow the two groups over time to see if their outcomes differ significantly.  It is unlikely that some prevention or risk management strategies will ever be tested in randomized trials for ethical or practical reasons. One example is exercise. Definitively testing the impact of exercise on Alzheimer's risk would require a huge  trial enrolling thousands of people and following them for many years. The expense and logistics of such a trial would be prohibitive, and it would require some people to go without exercise, a known health benefit.   THE MIND DIET   - get the book.

## 2022-05-17 ENCOUNTER — Encounter: Payer: Self-pay | Admitting: Neurology

## 2022-05-19 DIAGNOSIS — Z0000 Encounter for general adult medical examination without abnormal findings: Secondary | ICD-10-CM | POA: Diagnosis not present

## 2022-05-19 DIAGNOSIS — Z125 Encounter for screening for malignant neoplasm of prostate: Secondary | ICD-10-CM | POA: Diagnosis not present

## 2022-05-19 DIAGNOSIS — E785 Hyperlipidemia, unspecified: Secondary | ICD-10-CM | POA: Diagnosis not present

## 2022-05-19 DIAGNOSIS — F419 Anxiety disorder, unspecified: Secondary | ICD-10-CM | POA: Diagnosis not present

## 2022-05-19 DIAGNOSIS — R7989 Other specified abnormal findings of blood chemistry: Secondary | ICD-10-CM | POA: Diagnosis not present

## 2022-05-19 DIAGNOSIS — M109 Gout, unspecified: Secondary | ICD-10-CM | POA: Diagnosis not present

## 2022-05-19 DIAGNOSIS — E291 Testicular hypofunction: Secondary | ICD-10-CM | POA: Diagnosis not present

## 2022-05-19 DIAGNOSIS — I10 Essential (primary) hypertension: Secondary | ICD-10-CM | POA: Diagnosis not present

## 2022-05-19 DIAGNOSIS — R7301 Impaired fasting glucose: Secondary | ICD-10-CM | POA: Diagnosis not present

## 2022-05-20 DIAGNOSIS — R29898 Other symptoms and signs involving the musculoskeletal system: Secondary | ICD-10-CM | POA: Diagnosis not present

## 2022-05-20 DIAGNOSIS — Z7409 Other reduced mobility: Secondary | ICD-10-CM | POA: Diagnosis not present

## 2022-05-20 DIAGNOSIS — Z789 Other specified health status: Secondary | ICD-10-CM | POA: Diagnosis not present

## 2022-05-20 DIAGNOSIS — Z96651 Presence of right artificial knee joint: Secondary | ICD-10-CM | POA: Diagnosis not present

## 2022-05-20 DIAGNOSIS — M25561 Pain in right knee: Secondary | ICD-10-CM | POA: Diagnosis not present

## 2022-05-20 DIAGNOSIS — M1711 Unilateral primary osteoarthritis, right knee: Secondary | ICD-10-CM | POA: Diagnosis not present

## 2022-05-26 DIAGNOSIS — R066 Hiccough: Secondary | ICD-10-CM | POA: Diagnosis not present

## 2022-05-26 DIAGNOSIS — Z96651 Presence of right artificial knee joint: Secondary | ICD-10-CM | POA: Diagnosis not present

## 2022-05-26 DIAGNOSIS — N1832 Chronic kidney disease, stage 3b: Secondary | ICD-10-CM | POA: Diagnosis not present

## 2022-05-26 DIAGNOSIS — K219 Gastro-esophageal reflux disease without esophagitis: Secondary | ICD-10-CM | POA: Diagnosis not present

## 2022-05-26 DIAGNOSIS — R7301 Impaired fasting glucose: Secondary | ICD-10-CM | POA: Diagnosis not present

## 2022-05-26 DIAGNOSIS — R82998 Other abnormal findings in urine: Secondary | ICD-10-CM | POA: Diagnosis not present

## 2022-05-26 DIAGNOSIS — E669 Obesity, unspecified: Secondary | ICD-10-CM | POA: Diagnosis not present

## 2022-05-26 DIAGNOSIS — I129 Hypertensive chronic kidney disease with stage 1 through stage 4 chronic kidney disease, or unspecified chronic kidney disease: Secondary | ICD-10-CM | POA: Diagnosis not present

## 2022-05-26 DIAGNOSIS — F419 Anxiety disorder, unspecified: Secondary | ICD-10-CM | POA: Diagnosis not present

## 2022-05-26 DIAGNOSIS — E785 Hyperlipidemia, unspecified: Secondary | ICD-10-CM | POA: Diagnosis not present

## 2022-05-26 DIAGNOSIS — Z0000 Encounter for general adult medical examination without abnormal findings: Secondary | ICD-10-CM | POA: Diagnosis not present

## 2022-05-26 DIAGNOSIS — I2584 Coronary atherosclerosis due to calcified coronary lesion: Secondary | ICD-10-CM | POA: Diagnosis not present

## 2022-05-26 DIAGNOSIS — I10 Essential (primary) hypertension: Secondary | ICD-10-CM | POA: Diagnosis not present

## 2022-05-26 DIAGNOSIS — C679 Malignant neoplasm of bladder, unspecified: Secondary | ICD-10-CM | POA: Diagnosis not present

## 2022-05-27 DIAGNOSIS — M1711 Unilateral primary osteoarthritis, right knee: Secondary | ICD-10-CM | POA: Diagnosis not present

## 2022-05-27 DIAGNOSIS — M25561 Pain in right knee: Secondary | ICD-10-CM | POA: Diagnosis not present

## 2022-05-27 DIAGNOSIS — Z96651 Presence of right artificial knee joint: Secondary | ICD-10-CM | POA: Diagnosis not present

## 2022-05-27 DIAGNOSIS — Z7409 Other reduced mobility: Secondary | ICD-10-CM | POA: Diagnosis not present

## 2022-05-27 DIAGNOSIS — R29898 Other symptoms and signs involving the musculoskeletal system: Secondary | ICD-10-CM | POA: Diagnosis not present

## 2022-05-27 DIAGNOSIS — Z789 Other specified health status: Secondary | ICD-10-CM | POA: Diagnosis not present

## 2022-05-31 DIAGNOSIS — Z96651 Presence of right artificial knee joint: Secondary | ICD-10-CM | POA: Diagnosis not present

## 2022-05-31 DIAGNOSIS — L820 Inflamed seborrheic keratosis: Secondary | ICD-10-CM | POA: Diagnosis not present

## 2022-05-31 DIAGNOSIS — M25561 Pain in right knee: Secondary | ICD-10-CM | POA: Diagnosis not present

## 2022-05-31 DIAGNOSIS — L570 Actinic keratosis: Secondary | ICD-10-CM | POA: Diagnosis not present

## 2022-05-31 DIAGNOSIS — M1711 Unilateral primary osteoarthritis, right knee: Secondary | ICD-10-CM | POA: Diagnosis not present

## 2022-05-31 DIAGNOSIS — Z789 Other specified health status: Secondary | ICD-10-CM | POA: Diagnosis not present

## 2022-05-31 DIAGNOSIS — L821 Other seborrheic keratosis: Secondary | ICD-10-CM | POA: Diagnosis not present

## 2022-05-31 DIAGNOSIS — Z7409 Other reduced mobility: Secondary | ICD-10-CM | POA: Diagnosis not present

## 2022-05-31 DIAGNOSIS — D225 Melanocytic nevi of trunk: Secondary | ICD-10-CM | POA: Diagnosis not present

## 2022-05-31 DIAGNOSIS — D1801 Hemangioma of skin and subcutaneous tissue: Secondary | ICD-10-CM | POA: Diagnosis not present

## 2022-05-31 DIAGNOSIS — R29898 Other symptoms and signs involving the musculoskeletal system: Secondary | ICD-10-CM | POA: Diagnosis not present

## 2022-06-14 DIAGNOSIS — E291 Testicular hypofunction: Secondary | ICD-10-CM | POA: Diagnosis not present

## 2022-06-16 DIAGNOSIS — H401132 Primary open-angle glaucoma, bilateral, moderate stage: Secondary | ICD-10-CM | POA: Diagnosis not present

## 2022-06-19 ENCOUNTER — Other Ambulatory Visit: Payer: Self-pay | Admitting: Cardiology

## 2022-06-21 MED ORDER — ICOSAPENT ETHYL 1 G PO CAPS: 2.0000 g | ORAL_CAPSULE | Freq: Two times a day (BID) | ORAL | 2 refills | Status: DC

## 2022-06-29 ENCOUNTER — Other Ambulatory Visit: Payer: Self-pay | Admitting: Cardiology

## 2022-06-30 DIAGNOSIS — E291 Testicular hypofunction: Secondary | ICD-10-CM | POA: Diagnosis not present

## 2022-07-29 DIAGNOSIS — C672 Malignant neoplasm of lateral wall of bladder: Secondary | ICD-10-CM | POA: Diagnosis not present

## 2022-08-04 ENCOUNTER — Encounter: Payer: PPO | Attending: Psychology | Admitting: Psychology

## 2022-08-04 ENCOUNTER — Encounter: Payer: Self-pay | Admitting: Psychology

## 2022-08-04 DIAGNOSIS — F09 Unspecified mental disorder due to known physiological condition: Secondary | ICD-10-CM

## 2022-08-04 DIAGNOSIS — R413 Other amnesia: Secondary | ICD-10-CM

## 2022-08-04 NOTE — Progress Notes (Signed)
08/04/2022 10 AM-12 PM:  Today's visit was an in person visit dose conducted in my outpatient clinic office.  The patient myself were present for this visit.  Conducted a clinical interview with the patient and reviewed recent medical records today.  During the face-to-face clinical interview, the patient reports that he feels like he is doing better than on the previous neuropsychological evaluation.  He did offer information regarding the previous testing that he did not relate on the day of the testing.  Patient reports that 2 days before he was in the emergency department for 15 hours waiting to be seen for diverticulitis and was still very tired with lack of sleep due to this time.  He reports that he was concerned about delaying his visit in our office because of that and went ahead and went through with the testing.  This may have affected him to some degree.  The patient reports that his health has been much better with better blood pressure numbers and is now often below 120/70 with blood pressure readings and reports that his overall blood work is also improved.  The patient reports that he is reduced his alcohol intake considerably but does continue to have some alcohol consumption.  He continues to be compliant with his CPAP machine.  Patient feels like his overall health has been better and he reports that he is doing better as far as memory.  Patient had improvement in his MoCA testing with Dr. Brett Fairy recently.  Patient reports that he had knee replacement surgery of his right knee at [facility] and reports improvement but continued to have some degree of pain.  The surgery was done in April.  The patient clearly feels like his memory is better than it was when we first initiated this process which would argue against any type of progressive dementia type process.  The patient continues to take testosterone which had been discontinued for some period of time but now has resumed.  The patient feels  like the Aricept that he has been taking with prescription from Dr. Brett Fairy does help with his memory.  This may be related to some degrees of normal age-related microvascular ischemic changes.  He denies any side effects from this.  At this point, we are not going to do repeat testing and instead schedule a follow-up appointment in 1 year to assess for the need for repeat testing at that point.  The patient continues to remain stable and doing well with no indications of progression we may not do repeat testing even 1 year from now.  That determination will be made with a future appointment.

## 2022-08-11 DIAGNOSIS — Z96651 Presence of right artificial knee joint: Secondary | ICD-10-CM | POA: Diagnosis not present

## 2022-08-11 DIAGNOSIS — E291 Testicular hypofunction: Secondary | ICD-10-CM | POA: Diagnosis not present

## 2022-08-11 DIAGNOSIS — N1832 Chronic kidney disease, stage 3b: Secondary | ICD-10-CM | POA: Diagnosis not present

## 2022-08-11 DIAGNOSIS — G3184 Mild cognitive impairment, so stated: Secondary | ICD-10-CM | POA: Diagnosis not present

## 2022-08-11 DIAGNOSIS — I129 Hypertensive chronic kidney disease with stage 1 through stage 4 chronic kidney disease, or unspecified chronic kidney disease: Secondary | ICD-10-CM | POA: Diagnosis not present

## 2022-08-11 DIAGNOSIS — S6990XS Unspecified injury of unspecified wrist, hand and finger(s), sequela: Secondary | ICD-10-CM | POA: Diagnosis not present

## 2022-08-25 DIAGNOSIS — E291 Testicular hypofunction: Secondary | ICD-10-CM | POA: Diagnosis not present

## 2022-08-28 DIAGNOSIS — Z23 Encounter for immunization: Secondary | ICD-10-CM | POA: Diagnosis not present

## 2022-08-30 DIAGNOSIS — G5603 Carpal tunnel syndrome, bilateral upper limbs: Secondary | ICD-10-CM | POA: Diagnosis not present

## 2022-08-30 DIAGNOSIS — M79645 Pain in left finger(s): Secondary | ICD-10-CM | POA: Diagnosis not present

## 2022-08-30 DIAGNOSIS — M79644 Pain in right finger(s): Secondary | ICD-10-CM | POA: Diagnosis not present

## 2022-09-01 DIAGNOSIS — E291 Testicular hypofunction: Secondary | ICD-10-CM | POA: Diagnosis not present

## 2022-09-02 ENCOUNTER — Other Ambulatory Visit (HOSPITAL_BASED_OUTPATIENT_CLINIC_OR_DEPARTMENT_OTHER): Payer: Self-pay

## 2022-09-02 MED ORDER — COVID-19 MRNA 2023-2024 VACCINE (COMIRNATY) 0.3 ML INJECTION
INTRAMUSCULAR | 0 refills | Status: DC
  Filled 2022-09-02: qty 0.3, 1d supply, fill #0

## 2022-09-03 ENCOUNTER — Other Ambulatory Visit: Payer: Self-pay

## 2022-09-03 ENCOUNTER — Encounter: Payer: Self-pay | Admitting: Internal Medicine

## 2022-09-03 MED ORDER — METRONIDAZOLE 250 MG PO TABS: 250.0000 mg | ORAL_TABLET | Freq: Four times a day (QID) | ORAL | 1 refills | Status: DC

## 2022-09-08 DIAGNOSIS — Z96651 Presence of right artificial knee joint: Secondary | ICD-10-CM | POA: Diagnosis not present

## 2022-09-08 DIAGNOSIS — M7989 Other specified soft tissue disorders: Secondary | ICD-10-CM | POA: Diagnosis not present

## 2022-09-13 DIAGNOSIS — G5603 Carpal tunnel syndrome, bilateral upper limbs: Secondary | ICD-10-CM | POA: Diagnosis not present

## 2022-09-13 DIAGNOSIS — G609 Hereditary and idiopathic neuropathy, unspecified: Secondary | ICD-10-CM | POA: Diagnosis not present

## 2022-09-20 DIAGNOSIS — G5603 Carpal tunnel syndrome, bilateral upper limbs: Secondary | ICD-10-CM | POA: Diagnosis not present

## 2022-09-22 DIAGNOSIS — E291 Testicular hypofunction: Secondary | ICD-10-CM | POA: Diagnosis not present

## 2022-10-06 DIAGNOSIS — E291 Testicular hypofunction: Secondary | ICD-10-CM | POA: Diagnosis not present

## 2022-10-20 ENCOUNTER — Other Ambulatory Visit: Payer: Self-pay | Admitting: Cardiology

## 2022-10-20 DIAGNOSIS — E291 Testicular hypofunction: Secondary | ICD-10-CM | POA: Diagnosis not present

## 2022-10-26 DIAGNOSIS — H938X3 Other specified disorders of ear, bilateral: Secondary | ICD-10-CM | POA: Diagnosis not present

## 2022-10-26 DIAGNOSIS — J069 Acute upper respiratory infection, unspecified: Secondary | ICD-10-CM | POA: Diagnosis not present

## 2022-10-26 DIAGNOSIS — R0981 Nasal congestion: Secondary | ICD-10-CM | POA: Diagnosis not present

## 2022-10-26 DIAGNOSIS — J029 Acute pharyngitis, unspecified: Secondary | ICD-10-CM | POA: Diagnosis not present

## 2022-10-26 DIAGNOSIS — R059 Cough, unspecified: Secondary | ICD-10-CM | POA: Diagnosis not present

## 2022-10-26 DIAGNOSIS — I129 Hypertensive chronic kidney disease with stage 1 through stage 4 chronic kidney disease, or unspecified chronic kidney disease: Secondary | ICD-10-CM | POA: Diagnosis not present

## 2022-10-26 DIAGNOSIS — N1832 Chronic kidney disease, stage 3b: Secondary | ICD-10-CM | POA: Diagnosis not present

## 2022-10-26 DIAGNOSIS — Z1152 Encounter for screening for COVID-19: Secondary | ICD-10-CM | POA: Diagnosis not present

## 2022-10-28 DIAGNOSIS — Z8551 Personal history of malignant neoplasm of bladder: Secondary | ICD-10-CM | POA: Diagnosis not present

## 2022-11-01 ENCOUNTER — Encounter: Payer: Self-pay | Admitting: Podiatry

## 2022-11-01 ENCOUNTER — Ambulatory Visit: Payer: PPO | Admitting: Podiatry

## 2022-11-01 VITALS — BP 143/79 | HR 94

## 2022-11-01 DIAGNOSIS — B351 Tinea unguium: Secondary | ICD-10-CM

## 2022-11-01 MED ORDER — TERBINAFINE HCL 250 MG PO TABS: 250.0000 mg | ORAL_TABLET | Freq: Every day | ORAL | 0 refills | Status: DC

## 2022-11-01 NOTE — Progress Notes (Signed)
Chief Complaint  Patient presents with   Nail Problem    Patient is her for ongoing toe nail fungus great toe.    Subjective: 71 y.o. male presenting today for follow-up evaluation of fungal nail infection to the right great toenail.  Patient states in the past he has had oral antifungal medication which helped temporarily.  It was only a short course of antifungal and the discoloration and thickening to the toenail recurred.  He has also tried multiple topical antifungals with minimal improvement.  Past Medical History:  Diagnosis Date   Anxiety    Bladder tumor    Carpal tunnel syndrome, left    intermittant   Chronic seasonal allergic rhinitis    Coronary artery disease    per Dr Aundra Dubin (cardiologist) note --- noted on CT chest coronary calcification -- ETT-Cardiolite normal in 2014   DDD (degenerative disc disease), cervical    Dilated cardiomyopathy ([place])    Diverticulosis of colon    Fecal incontinence    GERD (gastroesophageal reflux disease)    Grade II diastolic dysfunction 40/08/2724   per ECHO    Hemorrhoids    Hiatal hernia    History of adenomatous polyp of colon    2001;  2007;  2012   History of bladder cancer urologist-  dr Gaynelle Arabian   11-06-2013  s/p TURBT and BCG tx's   History of kidney stones    about 45 years ago   History of panic attacks    History of urethral stricture    Hyperlipidemia    Hypertension    Hypogonadism in male    LVH (left ventricular hypertrophy) 06/17/2017   Mild, noted on ECHO   Nonischemic cardiomyopathy [facility])    cardiologist-  dr Aundra Dubin-- per last echo 01/ 2016 ef 50-55%  (up from 09/ 2014 ef 45-50%)   OSA on CPAP    since 2014   Pneumonia 11/2010   Left lower lobe   Pulmonary hypertension ([place]) 06/17/2017   Mild, per ECHO    Past Surgical History:  Procedure Laterality Date   CARDIOVASCULAR STRESS TEST  08-21-2013  dr Aundra Dubin   normal nuclear study w/ no ischemia/  normal LV function and wall motion , ef 63%    CATARACT EXTRACTION W/ INTRAOCULAR LENS  IMPLANT, BILATERAL  2013   COLONOSCOPY  last one 03-03-2016   CYSTO/  DIRECT VISION INTERNAL URETEROTOMY  11/25/2006   LAPAROSCOPIC CHOLECYSTECTOMY  10/15/2006   MENISCUS REPAIR Right 12/2020   ORBITAL FRACTURE SURGERY  1986   left eye:  Jan 1986--- fixation of fractures, repair of optic nerve decompression , and complex closure   TONSILLECTOMY  child   TRANSTHORACIC ECHOCARDIOGRAM  12-16-2014   dr Aundra Dubin   grade 1 diastolic dysfunction, ef 36-64% (improved from last study in 2014 , previous ef 45-50%) /  trivial MR and TR/  mild LAE/  inferior vena cava dilated, respirophasic diameter changes were blunted (<50%), consistant w/ elevated central venous pressure   TRANSURETHRAL RESECTION OF BLADDER TUMOR Bilateral 05/16/2018   Procedure: TRANSURETHRAL RESECTION OF BLADDER TUMOR (TURBT)/ RETROGRADE;  Surgeon: Festus Aloe, MD;  Location: [facility];  Service: Urology;  Laterality: Bilateral;   TRANSURETHRAL RESECTION OF BLADDER TUMOR WITH GYRUS (TURBT-GYRUS) N/A 11/06/2013   Procedure: TRANSURETHRAL RESECTION OF BLADDER TUMOR WITH GYRUS (TURBT-GYRUS);  Surgeon: Ailene Rud, MD;  Location: [facility];  Service: Urology;  Laterality: N/A;   TRANSURETHRAL RESECTION OF BLADDER TUMOR WITH MITOMYCIN-C N/A 12/09/2016   Procedure: CYSTOSCOPY  TRANSURETHRAL RESECTION OF BLADDER TUMOR WITH MITOMYCIN-C;  Surgeon: Carolan Clines, MD;  Location: [facility];  Service: Urology;  Laterality: N/A;   VARICOSE VEIN SURGERY  2002   VASECTOMY      Allergies  Allergen Reactions   Bee Pollen     Other reaction(s): Cough "sinus issues"   Pollen Extract Cough    "sinus issues"   Sulfamethoxazole Nausea And Vomiting   Sulfamethoxazole-Trimethoprim Rash    Objective: Physical Exam General: The patient is alert and oriented x3 in no acute distress.  Dermatology: Hyperkeratotic, discolored, thickened, onychodystrophy noted to  the right hallux nail plate. Skin is warm, dry and supple bilateral lower extremities. Negative for open lesions or macerations.  Vascular: Palpable pedal pulses bilaterally. No edema or erythema noted. Capillary refill within normal limits.  Neurological: Epicritic and protective threshold grossly intact bilaterally.   Musculoskeletal Exam: No pedal deformity noted  Assessment: #1 Onychomycosis of toenail hallux nail plate  Plan of Care:  #1 Patient was evaluated. #2  Today we discussed different treatment options including oral, topical, and laser antifungal treatment modalities.  We discussed their efficacies and side effects.  Patient opts for oral antifungal treatment modality. #3 prescription for Lamisil 250 mg #90 daily. Pt denies a history of liver pathology or symptoms.  Blood work 03/20/2022 AST, ALT, and alkaline phosphatase WNL #4 return to clinic 6 months   Edrick Kins, DPM [facility]  Dr. Edrick Kins, DPM    [address], [place] [address]                Office [phone]  Fax [phone]

## 2022-11-09 DIAGNOSIS — J019 Acute sinusitis, unspecified: Secondary | ICD-10-CM | POA: Diagnosis not present

## 2022-11-09 DIAGNOSIS — H6123 Impacted cerumen, bilateral: Secondary | ICD-10-CM | POA: Diagnosis not present

## 2022-11-09 DIAGNOSIS — B9689 Other specified bacterial agents as the cause of diseases classified elsewhere: Secondary | ICD-10-CM | POA: Diagnosis not present

## 2022-11-10 DIAGNOSIS — E291 Testicular hypofunction: Secondary | ICD-10-CM | POA: Diagnosis not present

## 2022-11-17 DIAGNOSIS — E291 Testicular hypofunction: Secondary | ICD-10-CM | POA: Diagnosis not present

## 2022-11-24 DIAGNOSIS — E291 Testicular hypofunction: Secondary | ICD-10-CM | POA: Diagnosis not present

## 2022-11-26 DIAGNOSIS — E669 Obesity, unspecified: Secondary | ICD-10-CM | POA: Diagnosis not present

## 2022-11-26 DIAGNOSIS — G3184 Mild cognitive impairment, so stated: Secondary | ICD-10-CM | POA: Diagnosis not present

## 2022-11-26 DIAGNOSIS — C679 Malignant neoplasm of bladder, unspecified: Secondary | ICD-10-CM | POA: Diagnosis not present

## 2022-11-26 DIAGNOSIS — I8013 Phlebitis and thrombophlebitis of femoral vein, bilateral: Secondary | ICD-10-CM | POA: Diagnosis not present

## 2022-11-26 DIAGNOSIS — F419 Anxiety disorder, unspecified: Secondary | ICD-10-CM | POA: Diagnosis not present

## 2022-11-26 DIAGNOSIS — Z96651 Presence of right artificial knee joint: Secondary | ICD-10-CM | POA: Diagnosis not present

## 2022-11-26 DIAGNOSIS — R61 Generalized hyperhidrosis: Secondary | ICD-10-CM | POA: Diagnosis not present

## 2022-11-26 DIAGNOSIS — I2584 Coronary atherosclerosis due to calcified coronary lesion: Secondary | ICD-10-CM | POA: Diagnosis not present

## 2022-11-26 DIAGNOSIS — R7301 Impaired fasting glucose: Secondary | ICD-10-CM | POA: Diagnosis not present

## 2022-11-26 DIAGNOSIS — N1832 Chronic kidney disease, stage 3b: Secondary | ICD-10-CM | POA: Diagnosis not present

## 2022-11-26 DIAGNOSIS — I129 Hypertensive chronic kidney disease with stage 1 through stage 4 chronic kidney disease, or unspecified chronic kidney disease: Secondary | ICD-10-CM | POA: Diagnosis not present

## 2022-11-30 IMAGING — US US RENAL
1 series · 14 of 25 positions shown · non-contrast
Comparison: Abdominal ultrasound 10/15/2006, CT abdomen pelvis
11/26/2016

CLINICAL DATA: CKD

EXAM:
RENAL / URINARY TRACT ULTRASOUND COMPLETE

[Series 1: us renal · 0.25mm/px · 14 of 32 slices shown]
[im 1/32]
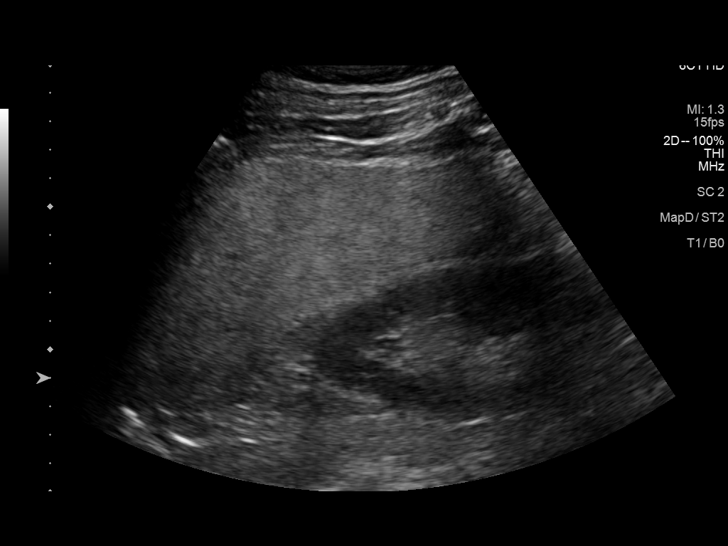
[im 3/32]
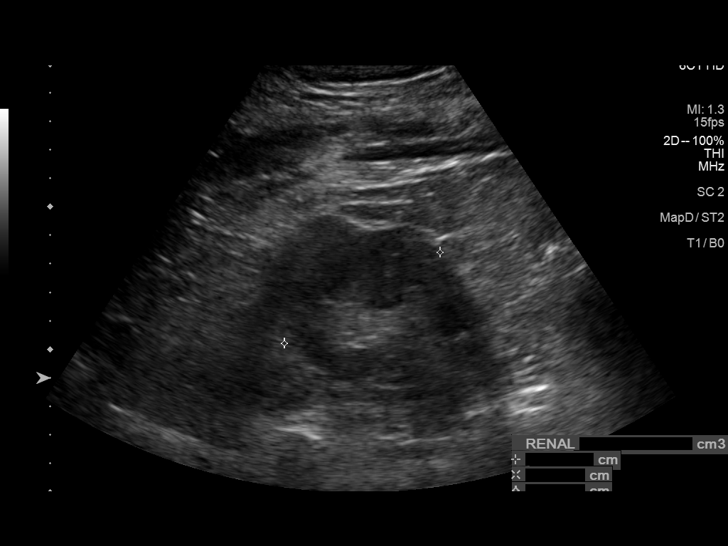
[im 6/32]
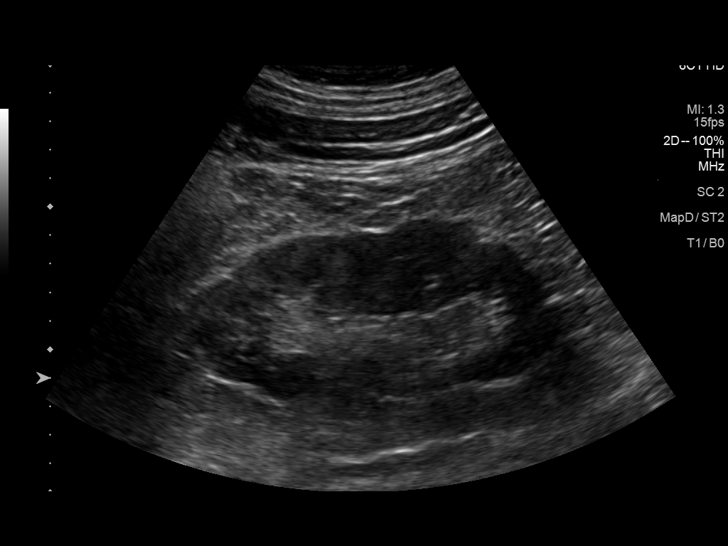
[im 8/32]
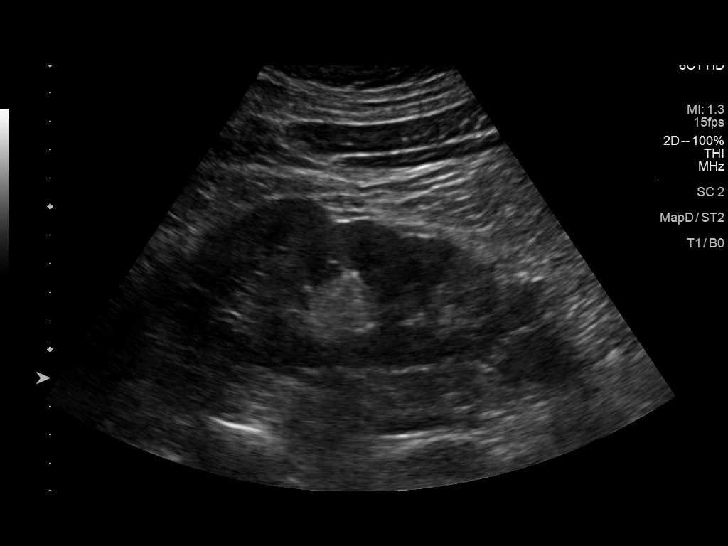
[im 11/32]
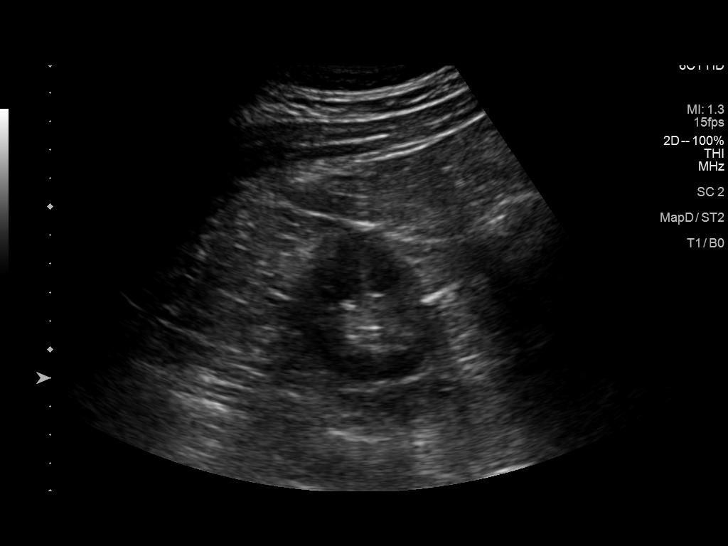
[im 12/32]
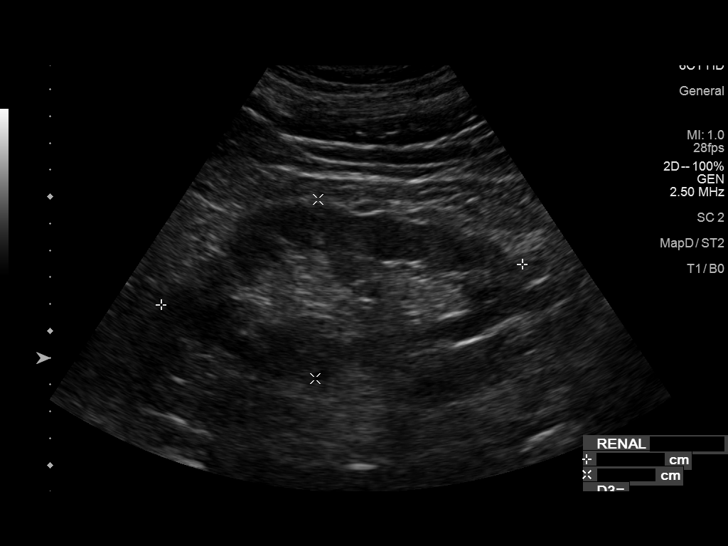
[im 15/32]
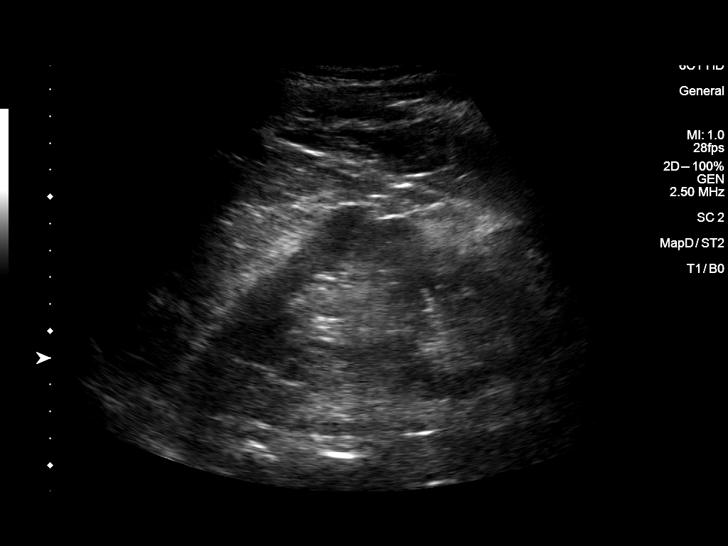
[im 17/32]
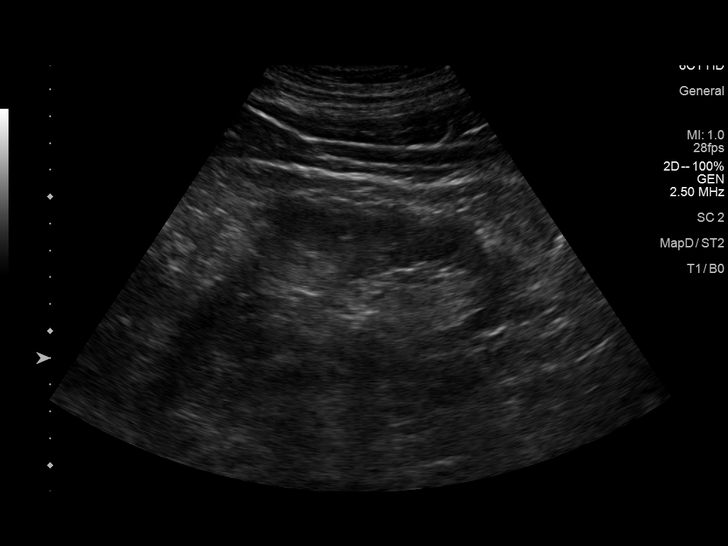
[im 20/32]
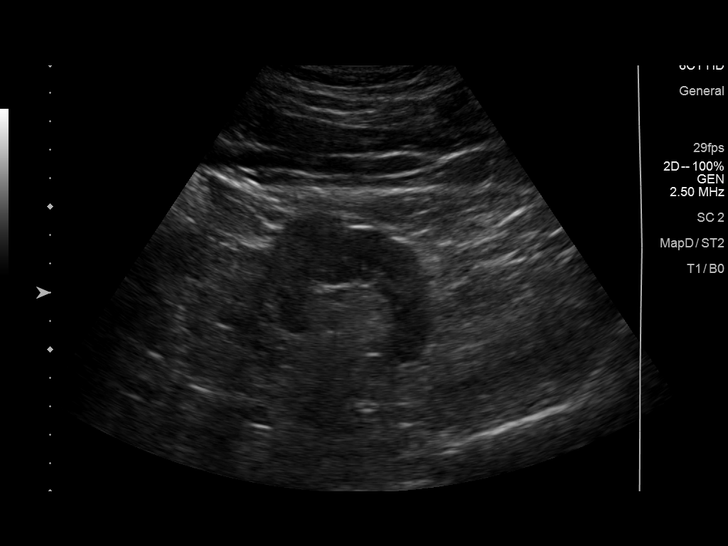
[im 21/32]
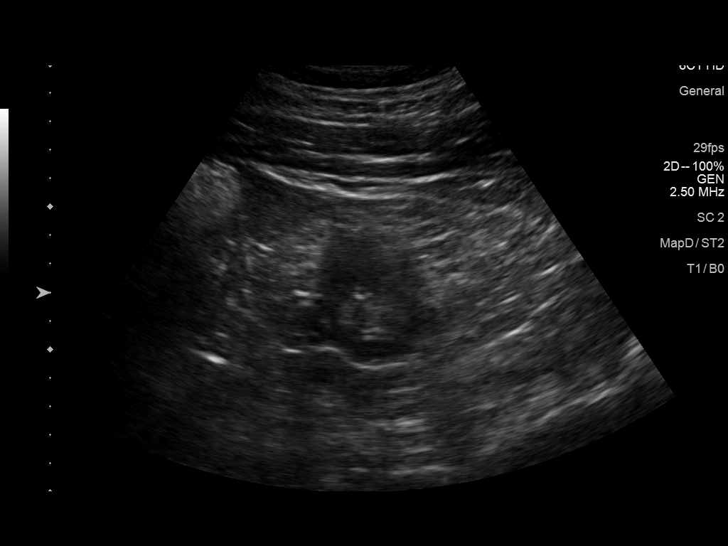
[im 24/32]
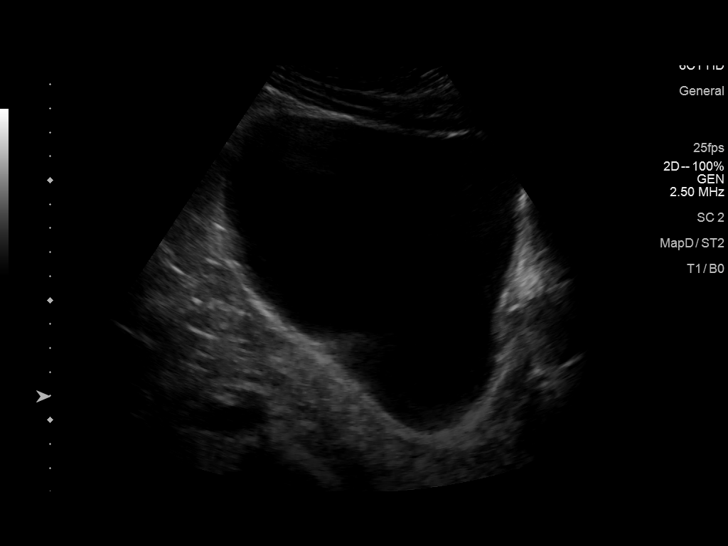
[im 26/32]
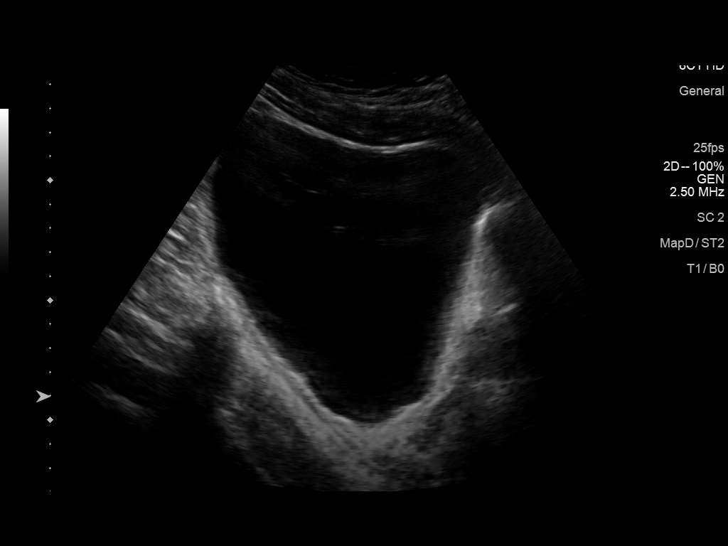
[im 29/32]
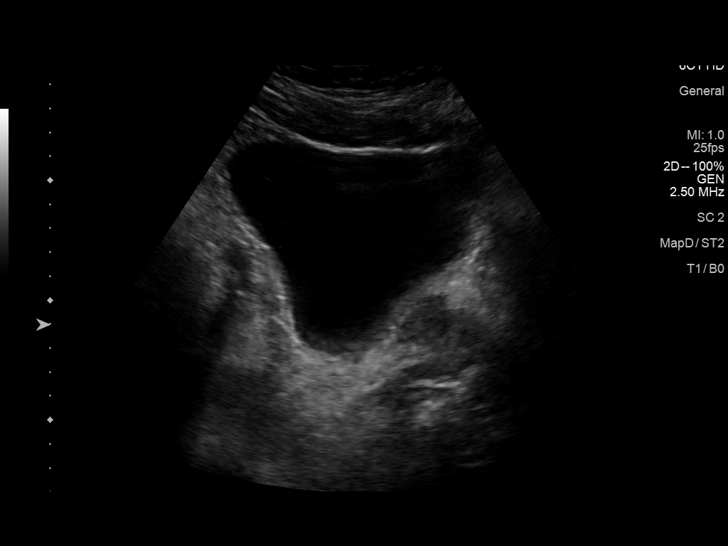
[im 32/32]
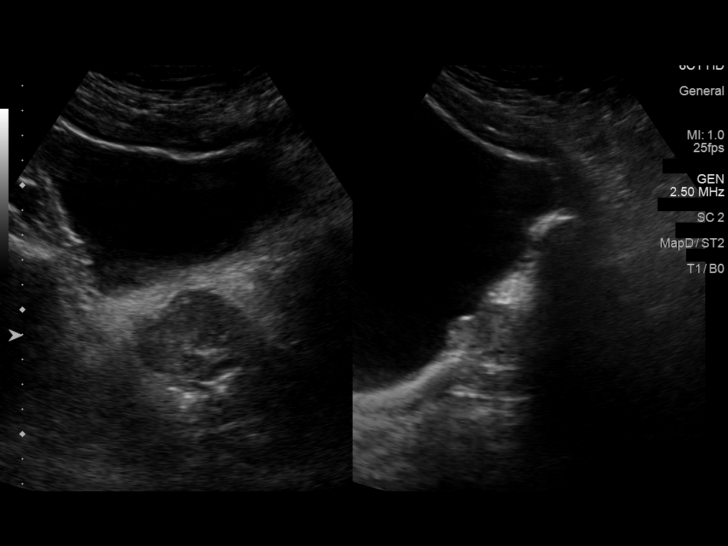

[14 of 25 positions shown; findings below may reference images not displayed]

FINDINGS: Right Kidney:

Renal measurements: 13.2 x 6.0 x 6.3 cm = volume: 260 mL.
Echogenicity within normal limits. No mass or hydronephrosis
visualized.

Left Kidney:

Renal measurements: 13.5 x 6.7 x 6.6 cm = volume: 311 mL.
Echogenicity within normal limits. No mass or hydronephrosis
visualized.

Bladder:

Appears normal for degree of bladder distention.

Other:

None.
IMPRESSION: Unremarkable sonographic appearance of the bilateral kidneys.

## 2022-12-01 DIAGNOSIS — E291 Testicular hypofunction: Secondary | ICD-10-CM | POA: Diagnosis not present

## 2022-12-08 DIAGNOSIS — E291 Testicular hypofunction: Secondary | ICD-10-CM | POA: Diagnosis not present

## 2022-12-13 DIAGNOSIS — E785 Hyperlipidemia, unspecified: Secondary | ICD-10-CM | POA: Diagnosis not present

## 2022-12-13 DIAGNOSIS — I10 Essential (primary) hypertension: Secondary | ICD-10-CM | POA: Diagnosis not present

## 2022-12-15 DIAGNOSIS — Z111 Encounter for screening for respiratory tuberculosis: Secondary | ICD-10-CM | POA: Diagnosis not present

## 2022-12-15 DIAGNOSIS — H401132 Primary open-angle glaucoma, bilateral, moderate stage: Secondary | ICD-10-CM | POA: Diagnosis not present

## 2022-12-15 DIAGNOSIS — E291 Testicular hypofunction: Secondary | ICD-10-CM | POA: Diagnosis not present

## 2022-12-21 ENCOUNTER — Ambulatory Visit: Payer: PPO | Admitting: Cardiology

## 2022-12-21 ENCOUNTER — Encounter: Payer: Self-pay | Admitting: Cardiology

## 2022-12-21 VITALS — BP 110/68 | HR 58 | Ht 75.0 in | Wt 276.8 lb

## 2022-12-21 DIAGNOSIS — I2510 Atherosclerotic heart disease of native coronary artery without angina pectoris: Secondary | ICD-10-CM

## 2022-12-21 DIAGNOSIS — I420 Dilated cardiomyopathy: Secondary | ICD-10-CM

## 2022-12-21 DIAGNOSIS — E782 Mixed hyperlipidemia: Secondary | ICD-10-CM | POA: Diagnosis not present

## 2022-12-21 DIAGNOSIS — I10 Essential (primary) hypertension: Secondary | ICD-10-CM | POA: Diagnosis not present

## 2022-12-21 NOTE — Patient Instructions (Signed)
Medication Instructions:  Your physician recommends that you continue on your current medications as directed. Please refer to the Current Medication list given to you today.  *If you need a refill on your cardiac medications before your next appointment, please call your pharmacy*  Lab Work: IN 3 MONTHS: Fasting lipids If you have labs (blood work) drawn today and your tests are completely normal, you will receive your results only by: [place] (if you have MyChart) OR A paper copy in the mail If you have any lab test that is abnormal or we need to change your treatment, we will call you to review the results.  Testing/Procedures: Your physician has requested that you have a calcium score CT scan. There is a $99 fee for the scan.   Follow-Up: At [facility], you and your health needs are our priority.  As part of our continuing mission to provide you with exceptional heart care, we have created designated Provider Care Teams.  These Care Teams include your primary Cardiologist (physician) and Advanced Practice Providers (APPs -  Physician Assistants and Nurse Practitioners) who all work together to provide you with the care you need, when you need it.  Your next appointment:   1 year(s)  Provider:   Fransico Him, MD

## 2022-12-21 NOTE — Addendum Note (Signed)
Addended by: Bernestine Amass on: 12/21/2022 04:02 PM   Modules accepted: Orders

## 2022-12-21 NOTE — Progress Notes (Signed)
12/21/2022 Early [name]   1951/04/09  [id number]  Primary Physician Prince Solian, MD Primary Cardiologist: Dr. Radford Pax Electrophysiologist: None   Reason for Visit/CC: f/u for CAD  HPI:  Brian Cortez is a 72 y.o. male with a hx of hyperlipidemia, HTN, DCM with prior EF 45-50% with mild global hypokinesis, coronary artery calcification on a chest CT with normal nuclear stress test and EF was 63% on the Cardiolite (contrasting with the echo).  He also has a history of bladder cancer and had resection and BCG treatment.  He is here today for followup and is doing well.  He denies any chest pain or pressure, SOB, DOE, PND, orthopnea, LE edema, dizziness, palpitations or syncope. He is compliant with his meds and is tolerating meds with no SE.    Current Meds  Medication Sig   amoxicillin (AMOXIL) 500 MG capsule Take 2,000 mg by mouth daily. Before dentist   Allergies  Allergen Reactions   Bee Pollen     Other reaction(s): Cough "sinus issues"   Pollen Extract Cough    "sinus issues"   Short Ragweed Pollen Ext Itching and Shortness Of Breath   Oxycodone Itching   Pine Tar Itching and Other (See Comments)    Sinus congestion   Sulfamethoxazole Nausea And Vomiting   Sulfamethoxazole-Trimethoprim Rash   Past Medical History:  Diagnosis Date   Anxiety    Bladder tumor    Carpal tunnel syndrome, left    intermittant   Chronic seasonal allergic rhinitis    Coronary artery disease    per Dr Aundra Dubin (cardiologist) note --- noted on CT chest coronary calcification -- ETT-Cardiolite normal in 2014   DDD (degenerative disc disease), cervical    Dilated cardiomyopathy ([place])    Diverticulosis of colon    Fecal incontinence    GERD (gastroesophageal reflux disease)    Grade II diastolic dysfunction 29/93/7169   per ECHO    Hemorrhoids    Hiatal hernia    History of adenomatous polyp of colon    2001;  2007;  2012   History of bladder cancer urologist-  dr Gaynelle Arabian    11-06-2013  s/p TURBT and BCG tx's   History of kidney stones    about 45 years ago   History of panic attacks    History of urethral stricture    Hyperlipidemia    Hypertension    Hypogonadism in male    LVH (left ventricular hypertrophy) 06/17/2017   Mild, noted on ECHO   Nonischemic cardiomyopathy [facility])    cardiologist-  dr Aundra Dubin-- per last echo 01/ 2016 ef 50-55%  (up from 09/ 2014 ef 45-50%)   OSA on CPAP    since 2014   Pneumonia 11/2010   Left lower lobe   Pulmonary hypertension ([place]) 06/17/2017   Mild, per ECHO   Family History  Problem Relation Age of Onset   Heart attack Father 27   Stroke Father 32   Heart attack Paternal Grandfather        in 74s   Prostate cancer Paternal Uncle    Heart attack Paternal Uncle         > 68   Heart attack Maternal Uncle 80       > 55   Colon cancer Neg Hx    Diabetes Neg Hx    Past Surgical History:  Procedure Laterality Date   CARDIOVASCULAR STRESS TEST  08-21-2013  dr Aundra Dubin   normal nuclear study w/ no ischemia/  normal LV function and wall motion , ef 63%   CATARACT EXTRACTION W/ INTRAOCULAR LENS  IMPLANT, BILATERAL  2013   COLONOSCOPY  last one 03-03-2016   CYSTO/  DIRECT VISION INTERNAL URETEROTOMY  11/25/2006   LAPAROSCOPIC CHOLECYSTECTOMY  10/15/2006   MENISCUS REPAIR Right 12/2020   ORBITAL FRACTURE SURGERY  1986   left eye:  Jan 1986--- fixation of fractures, repair of optic nerve decompression , and complex closure   TONSILLECTOMY  child   TRANSTHORACIC ECHOCARDIOGRAM  12-16-2014   dr Aundra Dubin   grade 1 diastolic dysfunction, ef 01-75% (improved from last study in 2014 , previous ef 45-50%) /  trivial MR and TR/  mild LAE/  inferior vena cava dilated, respirophasic diameter changes were blunted (<50%), consistant w/ elevated central venous pressure   TRANSURETHRAL RESECTION OF BLADDER TUMOR Bilateral 05/16/2018   Procedure: TRANSURETHRAL RESECTION OF BLADDER TUMOR (TURBT)/ RETROGRADE;  Surgeon: Festus Aloe,  MD;  Location: [facility];  Service: Urology;  Laterality: Bilateral;   TRANSURETHRAL RESECTION OF BLADDER TUMOR WITH GYRUS (TURBT-GYRUS) N/A 11/06/2013   Procedure: TRANSURETHRAL RESECTION OF BLADDER TUMOR WITH GYRUS (TURBT-GYRUS);  Surgeon: Ailene Rud, MD;  Location: [facility];  Service: Urology;  Laterality: N/A;   TRANSURETHRAL RESECTION OF BLADDER TUMOR WITH MITOMYCIN-C N/A 12/09/2016   Procedure: CYSTOSCOPY TRANSURETHRAL RESECTION OF BLADDER TUMOR WITH MITOMYCIN-C;  Surgeon: Carolan Clines, MD;  Location: [facility];  Service: Urology;  Laterality: N/A;   VARICOSE VEIN SURGERY  2002   VASECTOMY     Social History   Socioeconomic History   Marital status: Married    Spouse name: Pamala Hurry   Number of children: 2   Years of education: Social worker   Highest education level: Not on file  Occupational History   Occupation: [place]    Employer: [employer]   Occupation: PRESIDENT    Employer: [employer]  Tobacco Use   Smoking status: Never   Smokeless tobacco: Never  Vaping Use   Vaping Use: Never used  Substance and Sexual Activity   Alcohol use: Yes    Alcohol/week: 0.0 standard drinks of alcohol    Comment: occasional   Drug use: No   Sexual activity: Not on file    Comment: VASECTOMY  Other Topics Concern   Not on file  Social History Narrative   Patient lives at home with spouse.    Caffeine Use: 3-4 cups   Social Determinants of Health   Financial Resource Strain: Not on file  Food Insecurity: Not on file  Transportation Needs: Not on file  Physical Activity: Not on file  Stress: Not on file  Social Connections: Not on file  Intimate Partner Violence: Not on file     Lipid Panel     Component Value Date/Time   CHOL 171 10/09/2021 0731   TRIG 297 (H) 10/09/2021 0731   HDL 31 (L) 10/09/2021 0731   CHOLHDL 5.5 (H) 10/09/2021 0731   CHOLHDL 4 09/11/2014 0738   VLDL 57.0 (H) 09/11/2014 0738   LDLCALC 90  10/09/2021 0731   LDLDIRECT 67.7 09/11/2014 0738    Review of Systems: General: negative for chills, fever, night sweats or weight changes.  Cardiovascular: negative for chest pain, dyspnea on exertion, edema, orthopnea, palpitations, paroxysmal nocturnal dyspnea or shortness of breath Dermatological: negative for rash Respiratory: negative for cough or wheezing Urologic: negative for hematuria Abdominal: negative for nausea, vomiting, diarrhea, bright red blood per rectum, melena, or hematemesis Neurologic: negative for visual  changes, syncope, or dizziness All other systems reviewed and are otherwise negative except as noted above.   Physical Exam:  GEN: Well nourished, well developed in no acute distress HEENT: Normal NECK: No JVD; No carotid bruits LYMPHATICS: No lymphadenopathy CARDIAC:RRR, no murmurs, rubs, gallops RESPIRATORY:  Clear to auscultation without rales, wheezing or rhonchi  ABDOMEN: Soft, non-tender, non-distended MUSCULOSKELETAL:  No edema; No deformity  SKIN: Warm and dry NEUROLOGIC:  Alert and oriented x 3 PSYCHIATRIC:  Normal affect   EKG not performed today   ASSESSMENT AND PLAN:   1. Coronary Artery Calcifications on Chest CT:  -had NST that was negative for ischemia.  -He denies any anginal chest pain -Continue aspirin 81 mg daily and rosuvastatin 40 mg daily with as needed refills -I am going to get a coronary calcium score to assess future risk  2. Dilated Cardiomyopathy:  -EF improved on most recent studies.  -reviously 45-50%, but normal at 60-65% on last study in 2019.  -no BB due to bradycardia  3. HTN:  -BP controlled on exam -has not required any antihypertensive therapy  4. HLD/Hypertriglyceridemia - LDL goal < 70 given coronary calcifications.  -I have personally reviewed and interpreted outside Labs performed by patient's PCP which showed LDL 45, triglycerides 225 and HDL 35 on 05/19/2022 -Repeat FLP to see if triglycerides have  improved in 3 months as he had only been taking 2gm of Vascepa dailiy -Continue drug management with atorvastatin 40 mg daily and fenofibrate 160 mg daily and Vascepa 2 g twice daily with as needed refills    Marcelo Baldy, MHS [facility] 12/21/2022 3:53 PM

## 2022-12-23 ENCOUNTER — Other Ambulatory Visit: Payer: Self-pay | Admitting: Internal Medicine

## 2022-12-23 DIAGNOSIS — R61 Generalized hyperhidrosis: Secondary | ICD-10-CM

## 2022-12-29 ENCOUNTER — Ambulatory Visit
Admission: RE | Admit: 2022-12-29 | Discharge: 2022-12-29 | Disposition: A | Discharge status: Home / Self Care | Payer: PPO | Source: Ambulatory Visit | Attending: Internal Medicine | Admitting: Internal Medicine

## 2022-12-29 DIAGNOSIS — K429 Umbilical hernia without obstruction or gangrene: Secondary | ICD-10-CM | POA: Diagnosis not present

## 2022-12-29 DIAGNOSIS — K5730 Diverticulosis of large intestine without perforation or abscess without bleeding: Secondary | ICD-10-CM | POA: Diagnosis not present

## 2022-12-29 DIAGNOSIS — Z8551 Personal history of malignant neoplasm of bladder: Secondary | ICD-10-CM | POA: Diagnosis not present

## 2022-12-29 DIAGNOSIS — I2510 Atherosclerotic heart disease of native coronary artery without angina pectoris: Secondary | ICD-10-CM | POA: Diagnosis not present

## 2022-12-29 DIAGNOSIS — I700 Atherosclerosis of aorta: Secondary | ICD-10-CM | POA: Diagnosis not present

## 2022-12-29 DIAGNOSIS — R61 Generalized hyperhidrosis: Secondary | ICD-10-CM | POA: Diagnosis not present

## 2022-12-29 DIAGNOSIS — E291 Testicular hypofunction: Secondary | ICD-10-CM | POA: Diagnosis not present

## 2022-12-29 MED ORDER — IOPAMIDOL (ISOVUE-300) INJECTION 61%
125.0000 mL | Freq: Once | INTRAVENOUS | Status: AC | PRN
  Administered 2022-12-29: 125 mL via INTRAVENOUS

## 2022-12-31 ENCOUNTER — Encounter: Payer: Self-pay | Admitting: Cardiology

## 2022-12-31 ENCOUNTER — Telehealth: Payer: Self-pay

## 2022-12-31 NOTE — Telephone Encounter (Signed)
Called patient to review Dr. Theodosia Blender recommendation that he proceed with coronary calcium score even though he just completed CT of pelvis/abd. Explained that coronary CT is much more sensitive for seeing possible blockages, assessing blood flow and atherosclerosis. Patient verbalizes understanding and agrees to keep appt for Coronary CT.

## 2023-01-05 DIAGNOSIS — E291 Testicular hypofunction: Secondary | ICD-10-CM | POA: Diagnosis not present

## 2023-01-06 ENCOUNTER — Encounter: Payer: Self-pay | Admitting: Neurology

## 2023-01-10 DIAGNOSIS — M545 Low back pain, unspecified: Secondary | ICD-10-CM | POA: Diagnosis not present

## 2023-01-10 DIAGNOSIS — I10 Essential (primary) hypertension: Secondary | ICD-10-CM | POA: Diagnosis not present

## 2023-01-10 DIAGNOSIS — I129 Hypertensive chronic kidney disease with stage 1 through stage 4 chronic kidney disease, or unspecified chronic kidney disease: Secondary | ICD-10-CM | POA: Diagnosis not present

## 2023-01-10 DIAGNOSIS — R61 Generalized hyperhidrosis: Secondary | ICD-10-CM | POA: Diagnosis not present

## 2023-01-10 DIAGNOSIS — D751 Secondary polycythemia: Secondary | ICD-10-CM | POA: Diagnosis not present

## 2023-01-10 DIAGNOSIS — C679 Malignant neoplasm of bladder, unspecified: Secondary | ICD-10-CM | POA: Diagnosis not present

## 2023-01-11 ENCOUNTER — Ambulatory Visit (HOSPITAL_BASED_OUTPATIENT_CLINIC_OR_DEPARTMENT_OTHER)
Admission: RE | Admit: 2023-01-11 | Discharge: 2023-01-11 | Disposition: A | Discharge status: Home / Self Care | Payer: PPO | Source: Ambulatory Visit | Attending: Cardiology | Admitting: Cardiology

## 2023-01-11 ENCOUNTER — Encounter (HOSPITAL_BASED_OUTPATIENT_CLINIC_OR_DEPARTMENT_OTHER): Payer: Self-pay

## 2023-01-11 DIAGNOSIS — I420 Dilated cardiomyopathy: Secondary | ICD-10-CM | POA: Insufficient documentation

## 2023-01-11 DIAGNOSIS — I10 Essential (primary) hypertension: Secondary | ICD-10-CM

## 2023-01-11 DIAGNOSIS — I2510 Atherosclerotic heart disease of native coronary artery without angina pectoris: Secondary | ICD-10-CM | POA: Insufficient documentation

## 2023-01-11 DIAGNOSIS — E782 Mixed hyperlipidemia: Secondary | ICD-10-CM | POA: Insufficient documentation

## 2023-01-12 ENCOUNTER — Telehealth: Payer: Self-pay

## 2023-01-12 DIAGNOSIS — E291 Testicular hypofunction: Secondary | ICD-10-CM | POA: Diagnosis not present

## 2023-01-12 DIAGNOSIS — I2510 Atherosclerotic heart disease of native coronary artery without angina pectoris: Secondary | ICD-10-CM

## 2023-01-12 NOTE — Telephone Encounter (Signed)
-----   Message from Sueanne Margarita, MD sent at 01/11/2023 10:11 AM EST ----- Coronary calcium score is very high at 1582.  This means he does have some underlying coronary disease I would like him to have a stress PET CT to rule out ischemia.  I have discussed this with the patient on the phone  Shared Decision Making/Informed Consent  The risks [chest pain, shortness of breath, cardiac arrhythmias, dizziness, blood pressure fluctuations, myocardial infarction, stroke/transient ischemic attack, nausea, vomiting, allergic reaction, radiation exposure, metallic taste sensation and life-threatening complications (estimated to be 1 in 10,000)], benefits (risk stratification, diagnosing coronary artery disease, treatment guidance) and alternatives of a cardiac PET stress test were discussed in detail with Brian Cortez and he agrees to proceed.  Please get a copy of last lipids from PCP.  He was encouraged to call with any CP or DOE or exertional fatigue with any activities he was able to do in the past

## 2023-01-12 NOTE — Telephone Encounter (Signed)
Called patient to review instructions for cardiac Stress/PET/CT. Instructions also sent via Mychart. He was encouraged to call with any CP or DOE or exertional fatigue with any activities he was able to do in the past. Order for Stress/PET/CT placed. Forwarded request for last lipids to PCP.

## 2023-01-19 DIAGNOSIS — E291 Testicular hypofunction: Secondary | ICD-10-CM | POA: Diagnosis not present

## 2023-01-21 ENCOUNTER — Telehealth (HOSPITAL_COMMUNITY): Payer: Self-pay | Admitting: Emergency Medicine

## 2023-01-21 NOTE — Telephone Encounter (Signed)
Reaching out to patient to offer assistance regarding upcoming cardiac imaging study; pt verbalizes understanding of appt date/time, parking situation and where to check in, pre-test NPO status and medications ordered, and verified current allergies; name and call back number provided for further questions should they arise Brian Bond RN Navigator Cardiac Imaging [facility] [phone] office [phone] cell  Arrival [address] [facility] [address] Denies iv issues No caffeine 12 hr  No food 3 hr Holding daily til after scan

## 2023-01-21 NOTE — Telephone Encounter (Signed)
Attempted to call patient regarding upcoming cardiac PET  appointment. Left message on voicemail with name and callback number Marchia Bond RN Navigator Cardiac [facility] [phone] Office [phone] Cell

## 2023-01-24 DIAGNOSIS — R61 Generalized hyperhidrosis: Secondary | ICD-10-CM | POA: Diagnosis not present

## 2023-01-24 MED ORDER — REGADENOSON 0.4 MG/5ML IV SOLN
0.4000 mg | Freq: Once | INTRAVENOUS | Status: AC
  Administered 2023-01-25: 0.4 mg via INTRAVENOUS

## 2023-01-24 MED ORDER — REGADENOSON 0.4 MG/5ML IV SOLN: 0.4000 mg | Freq: Once | INTRAVENOUS | Status: DC

## 2023-01-25 ENCOUNTER — Encounter (HOSPITAL_COMMUNITY)
Admission: RE | Admit: 2023-01-25 | Discharge: 2023-01-25 | Disposition: A | Discharge status: Home / Self Care | Payer: PPO | Source: Ambulatory Visit | Attending: Cardiology | Admitting: Cardiology

## 2023-01-25 DIAGNOSIS — I2510 Atherosclerotic heart disease of native coronary artery without angina pectoris: Secondary | ICD-10-CM | POA: Insufficient documentation

## 2023-01-25 LAB — NM PET CT CARDIAC PERFUSION MULTI W/ABSOLUTE BLOODFLOW
MBFR: 2.36
Nuc Rest EF: 46 %
Nuc Stress EF: 50 %
Rest MBF: 0.47 ml/g/min
Rest Nuclear Isotope Dose: 23.7 mCi
ST Depression (mm): 0 mm
Stress MBF: 1.11 ml/g/min
Stress Nuclear Isotope Dose: 23.3 mCi
TID: 1.12

## 2023-01-25 MED ORDER — RUBIDIUM RB82 GENERATOR (RUBYFILL)
25.0000 | PACK | Freq: Once | INTRAVENOUS | Status: AC
  Administered 2023-01-25: 23.3 via INTRAVENOUS

## 2023-01-25 MED ORDER — RUBIDIUM RB82 GENERATOR (RUBYFILL)
25.0000 | PACK | Freq: Once | INTRAVENOUS | Status: AC
  Administered 2023-01-25: 23.7 via INTRAVENOUS

## 2023-01-26 ENCOUNTER — Telehealth: Payer: Self-pay

## 2023-01-26 DIAGNOSIS — E291 Testicular hypofunction: Secondary | ICD-10-CM | POA: Diagnosis not present

## 2023-01-26 NOTE — Telephone Encounter (Signed)
Reviewed WNL results  with patient and discussed Dr. Oralia Rud recommendations to continue current regimen. Patient verbalizes understanding.

## 2023-01-26 NOTE — Telephone Encounter (Signed)
-----   Message from Werner Lean, MD sent at 01/26/2023  1:45 PM EST ----- Testing Results WNL.  No change to diagnostic or therapeutic plan.

## 2023-02-01 MED FILL — Regadenoson IV Inj 0.4 MG/5ML (0.08 MG/ML): INTRAVENOUS | Qty: 5 | Status: AC

## 2023-02-02 DIAGNOSIS — E291 Testicular hypofunction: Secondary | ICD-10-CM | POA: Diagnosis not present

## 2023-02-08 DIAGNOSIS — G43909 Migraine, unspecified, not intractable, without status migrainosus: Secondary | ICD-10-CM | POA: Diagnosis not present

## 2023-02-08 DIAGNOSIS — N1832 Chronic kidney disease, stage 3b: Secondary | ICD-10-CM | POA: Diagnosis not present

## 2023-02-08 DIAGNOSIS — R6883 Chills (without fever): Secondary | ICD-10-CM | POA: Diagnosis not present

## 2023-02-08 DIAGNOSIS — J029 Acute pharyngitis, unspecified: Secondary | ICD-10-CM | POA: Diagnosis not present

## 2023-02-08 DIAGNOSIS — R0981 Nasal congestion: Secondary | ICD-10-CM | POA: Diagnosis not present

## 2023-02-08 DIAGNOSIS — J069 Acute upper respiratory infection, unspecified: Secondary | ICD-10-CM | POA: Diagnosis not present

## 2023-02-08 DIAGNOSIS — Z1152 Encounter for screening for COVID-19: Secondary | ICD-10-CM | POA: Diagnosis not present

## 2023-02-08 DIAGNOSIS — R059 Cough, unspecified: Secondary | ICD-10-CM | POA: Diagnosis not present

## 2023-02-08 DIAGNOSIS — I129 Hypertensive chronic kidney disease with stage 1 through stage 4 chronic kidney disease, or unspecified chronic kidney disease: Secondary | ICD-10-CM | POA: Diagnosis not present

## 2023-02-09 DIAGNOSIS — E291 Testicular hypofunction: Secondary | ICD-10-CM | POA: Diagnosis not present

## 2023-02-16 DIAGNOSIS — E291 Testicular hypofunction: Secondary | ICD-10-CM | POA: Diagnosis not present

## 2023-02-21 ENCOUNTER — Encounter: Payer: Self-pay | Admitting: Cardiology

## 2023-02-21 MED ORDER — ICOSAPENT ETHYL 1 G PO CAPS: 2.0000 g | ORAL_CAPSULE | Freq: Two times a day (BID) | ORAL | 2 refills | Status: DC

## 2023-02-21 NOTE — Addendum Note (Signed)
Addended by: Vergia Alcon A on: 02/21/2023 03:29 PM   Modules accepted: Orders

## 2023-02-23 DIAGNOSIS — E291 Testicular hypofunction: Secondary | ICD-10-CM | POA: Diagnosis not present

## 2023-02-25 ENCOUNTER — Ambulatory Visit: Payer: Medicare HMO | Attending: Cardiology

## 2023-02-25 DIAGNOSIS — I2510 Atherosclerotic heart disease of native coronary artery without angina pectoris: Secondary | ICD-10-CM | POA: Diagnosis not present

## 2023-02-25 DIAGNOSIS — E782 Mixed hyperlipidemia: Secondary | ICD-10-CM | POA: Diagnosis not present

## 2023-02-25 DIAGNOSIS — I420 Dilated cardiomyopathy: Secondary | ICD-10-CM | POA: Diagnosis not present

## 2023-02-25 DIAGNOSIS — I10 Essential (primary) hypertension: Secondary | ICD-10-CM

## 2023-02-26 LAB — LIPID PANEL
Chol/HDL Ratio: 3.5 ratio (ref 0.0–5.0)
Cholesterol, Total: 138 mg/dL (ref 100–199)
HDL: 40 mg/dL (ref >39)
LDL Chol Calc (NIH): 60 mg/dL (ref 0–99)
Triglycerides: 235 mg/dL — ABNORMAL HIGH (ref 0–149)
VLDL Cholesterol Cal: 38 mg/dL (ref 5–40)

## 2023-03-02 ENCOUNTER — Telehealth: Payer: Self-pay

## 2023-03-02 DIAGNOSIS — Z96651 Presence of right artificial knee joint: Secondary | ICD-10-CM | POA: Diagnosis not present

## 2023-03-02 DIAGNOSIS — R7301 Impaired fasting glucose: Secondary | ICD-10-CM | POA: Diagnosis not present

## 2023-03-02 DIAGNOSIS — E669 Obesity, unspecified: Secondary | ICD-10-CM | POA: Diagnosis not present

## 2023-03-02 DIAGNOSIS — F419 Anxiety disorder, unspecified: Secondary | ICD-10-CM | POA: Diagnosis not present

## 2023-03-02 DIAGNOSIS — K219 Gastro-esophageal reflux disease without esophagitis: Secondary | ICD-10-CM | POA: Diagnosis not present

## 2023-03-02 DIAGNOSIS — I129 Hypertensive chronic kidney disease with stage 1 through stage 4 chronic kidney disease, or unspecified chronic kidney disease: Secondary | ICD-10-CM | POA: Diagnosis not present

## 2023-03-02 DIAGNOSIS — E785 Hyperlipidemia, unspecified: Secondary | ICD-10-CM | POA: Diagnosis not present

## 2023-03-02 DIAGNOSIS — N1832 Chronic kidney disease, stage 3b: Secondary | ICD-10-CM | POA: Diagnosis not present

## 2023-03-02 DIAGNOSIS — C679 Malignant neoplasm of bladder, unspecified: Secondary | ICD-10-CM | POA: Diagnosis not present

## 2023-03-02 DIAGNOSIS — G3184 Mild cognitive impairment, so stated: Secondary | ICD-10-CM | POA: Diagnosis not present

## 2023-03-02 DIAGNOSIS — E291 Testicular hypofunction: Secondary | ICD-10-CM | POA: Diagnosis not present

## 2023-03-02 DIAGNOSIS — R61 Generalized hyperhidrosis: Secondary | ICD-10-CM | POA: Diagnosis not present

## 2023-03-02 NOTE — Telephone Encounter (Signed)
-----   Message from Quintella Reichert, MD sent at 02/27/2023  6:25 PM EDT ----- Please confirm that patient has been taking Vascepa 2gm BID and his labs were fasting

## 2023-03-02 NOTE — Telephone Encounter (Signed)
Called and spoke to patient about improvements in cholesterol labs and Dr. Norris Cross questions. He states he was fasting for his lipid panel and ALT, and he confirms he has been taking his Vascepa 2 g BID. He states he has cut out many sweets and limited his carbs. He endorses alcohol intake of several drinks a few nights a week. Patient states he has recently lost 3 lbs so he is hoping his labs will continue to improve. Patient's responses forwarded to Dr. Mayford Knife.

## 2023-03-03 NOTE — Telephone Encounter (Signed)
Per Dr. Mayford Knife, called patient and left detailed voice mail (DPR) advising on strategies to cut down on ETOH intake. Provided education that 2 drinks a week is ideal, and sometimes replacing alcohol with mocktails, carbonated water drinks or tea can help to reduce intake. Also provided education on how reducing alcohol can positively impact triglycerides.

## 2023-03-09 DIAGNOSIS — E291 Testicular hypofunction: Secondary | ICD-10-CM | POA: Diagnosis not present

## 2023-03-11 DIAGNOSIS — Z96651 Presence of right artificial knee joint: Secondary | ICD-10-CM | POA: Diagnosis not present

## 2023-03-11 DIAGNOSIS — M25461 Effusion, right knee: Secondary | ICD-10-CM | POA: Diagnosis not present

## 2023-03-16 DIAGNOSIS — E291 Testicular hypofunction: Secondary | ICD-10-CM | POA: Diagnosis not present

## 2023-03-18 ENCOUNTER — Other Ambulatory Visit: Payer: Self-pay | Admitting: Internal Medicine

## 2023-03-23 DIAGNOSIS — E291 Testicular hypofunction: Secondary | ICD-10-CM | POA: Diagnosis not present

## 2023-03-29 DIAGNOSIS — E291 Testicular hypofunction: Secondary | ICD-10-CM | POA: Diagnosis not present

## 2023-03-29 DIAGNOSIS — R61 Generalized hyperhidrosis: Secondary | ICD-10-CM | POA: Diagnosis not present

## 2023-03-29 DIAGNOSIS — R232 Flushing: Secondary | ICD-10-CM | POA: Diagnosis not present

## 2023-03-29 DIAGNOSIS — E7800 Pure hypercholesterolemia, unspecified: Secondary | ICD-10-CM | POA: Diagnosis not present

## 2023-03-29 DIAGNOSIS — R7301 Impaired fasting glucose: Secondary | ICD-10-CM | POA: Diagnosis not present

## 2023-03-31 ENCOUNTER — Other Ambulatory Visit: Payer: Self-pay | Admitting: Cardiology

## 2023-03-31 DIAGNOSIS — Z08 Encounter for follow-up examination after completed treatment for malignant neoplasm: Secondary | ICD-10-CM | POA: Diagnosis not present

## 2023-03-31 DIAGNOSIS — D414 Neoplasm of uncertain behavior of bladder: Secondary | ICD-10-CM | POA: Diagnosis not present

## 2023-03-31 DIAGNOSIS — N3289 Other specified disorders of bladder: Secondary | ICD-10-CM | POA: Diagnosis not present

## 2023-03-31 DIAGNOSIS — N400 Enlarged prostate without lower urinary tract symptoms: Secondary | ICD-10-CM | POA: Diagnosis not present

## 2023-04-04 DIAGNOSIS — E7800 Pure hypercholesterolemia, unspecified: Secondary | ICD-10-CM | POA: Diagnosis not present

## 2023-04-04 DIAGNOSIS — R61 Generalized hyperhidrosis: Secondary | ICD-10-CM | POA: Diagnosis not present

## 2023-04-06 DIAGNOSIS — E291 Testicular hypofunction: Secondary | ICD-10-CM | POA: Diagnosis not present

## 2023-04-12 ENCOUNTER — Encounter (INDEPENDENT_AMBULATORY_CARE_PROVIDER_SITE_OTHER): Payer: PPO | Admitting: Ophthalmology

## 2023-04-12 ENCOUNTER — Other Ambulatory Visit: Payer: Self-pay | Admitting: Cardiology

## 2023-04-12 ENCOUNTER — Encounter (INDEPENDENT_AMBULATORY_CARE_PROVIDER_SITE_OTHER): Payer: Self-pay

## 2023-04-13 DIAGNOSIS — R300 Dysuria: Secondary | ICD-10-CM | POA: Diagnosis not present

## 2023-04-13 DIAGNOSIS — N521 Erectile dysfunction due to diseases classified elsewhere: Secondary | ICD-10-CM | POA: Diagnosis not present

## 2023-04-15 DIAGNOSIS — E291 Testicular hypofunction: Secondary | ICD-10-CM | POA: Diagnosis not present

## 2023-04-20 DIAGNOSIS — Z9889 Other specified postprocedural states: Secondary | ICD-10-CM | POA: Diagnosis not present

## 2023-04-20 DIAGNOSIS — E291 Testicular hypofunction: Secondary | ICD-10-CM | POA: Diagnosis not present

## 2023-04-20 DIAGNOSIS — H353132 Nonexudative age-related macular degeneration, bilateral, intermediate dry stage: Secondary | ICD-10-CM | POA: Diagnosis not present

## 2023-04-20 DIAGNOSIS — H43811 Vitreous degeneration, right eye: Secondary | ICD-10-CM | POA: Diagnosis not present

## 2023-04-27 DIAGNOSIS — E291 Testicular hypofunction: Secondary | ICD-10-CM | POA: Diagnosis not present

## 2023-04-29 DIAGNOSIS — E291 Testicular hypofunction: Secondary | ICD-10-CM | POA: Diagnosis not present

## 2023-05-04 DIAGNOSIS — E291 Testicular hypofunction: Secondary | ICD-10-CM | POA: Diagnosis not present

## 2023-05-16 ENCOUNTER — Other Ambulatory Visit: Payer: Self-pay | Admitting: Neurology

## 2023-05-16 ENCOUNTER — Encounter: Payer: Self-pay | Admitting: Neurology

## 2023-05-16 ENCOUNTER — Ambulatory Visit: Payer: Medicare HMO | Admitting: Neurology

## 2023-05-16 VITALS — Ht 75.0 in | Wt 267.0 lb

## 2023-05-16 DIAGNOSIS — G4730 Sleep apnea, unspecified: Secondary | ICD-10-CM | POA: Diagnosis not present

## 2023-05-16 DIAGNOSIS — C679 Malignant neoplasm of bladder, unspecified: Secondary | ICD-10-CM | POA: Diagnosis not present

## 2023-05-16 DIAGNOSIS — I5189 Other ill-defined heart diseases: Secondary | ICD-10-CM

## 2023-05-16 DIAGNOSIS — N1832 Chronic kidney disease, stage 3b: Secondary | ICD-10-CM

## 2023-05-16 DIAGNOSIS — G3184 Mild cognitive impairment, so stated: Secondary | ICD-10-CM

## 2023-05-16 DIAGNOSIS — I422 Other hypertrophic cardiomyopathy: Secondary | ICD-10-CM | POA: Diagnosis not present

## 2023-05-16 MED ORDER — DONEPEZIL HCL 10 MG PO TBDP: 10.0000 mg | ORAL_TABLET | Freq: Every day | ORAL | 3 refills | Status: DC

## 2023-05-16 NOTE — Patient Instructions (Signed)
1) [facility] , 25/ 30 points on MOCA- detail was missing, the cube pointed in the other direction, the clock face had 4 numbers- almost 27/ 30 .  Will refill the generic form of Aricept.    2) sweating is resolved , was caused by testosterone.    3) CPAP works well. 100% compliance, needs yearly follow-up.    I plan to follow up either personally or through our NP within 12 months

## 2023-05-16 NOTE — Progress Notes (Signed)
[facility]   SLEEP MEDICINE CLINIC    Provider:  Melvyn Novas, [place]  Referring Provider: Chilton Greathouse, MD Primary Care Physician:  Chilton Greathouse, MD   Provider:  Melvyn Novas, MD  Primary Care Physician:  Chilton Greathouse, MD         Chief Complaint according to patient   Patient presents with:     New Patient (Initial Visit)           HISTORY OF PRESENT ILLNESS:  Brian Cortez is a 72 y.o. male patient who is here for revisit 05/16/2023 for Memory Follow up.  Chief concern according to patient :  My knee is healing well after TKR,  My memory feels stabilized, no recent anecdotes. He was recently seen by Dr Talmage Nap , endocrinology and Jethro Bolus, urology who in cooperation  reduced testosterone doses. This let to a decline in nocturnal sweating.   He exercises 3 times a week and is socially active. His CPAP works fine.  We reviewed his compliance: He remains 100% compliance with an average of 8 hours 19 minutes and uses an AutoSet CPAP between 5 and 15 cmH2O with 3 cm EPR.  His residual AHI was 1.2 which is of great resolution of.  95th percentile pressure is 13 cm water I did not see any Cheyne-Stokes respirations evolving. I am fatigued at 63/ 63 points, and EDS at 1/ 24 points.   As to the mild cognitive impairment : we repeated today the  [facility] cognitive assessment.  The patient would have 25/ 30 points.   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   Dr Felipa Eth has run countless blood, urine, TB, and, contrast CT tests. All negative!   Readings suggest going to a dermatologist, which, I did today,with Dr Donzetta Starch. After hearing my symptoms and issues, he felt there were other issues at play beyond what he dealt with.  However, he did ask me to inquire of you if through your experience in prescribing Donepezil, could  there be any possible side effect of sweating from it. It is not a drug he had any experience with.         Chief Complaint  Patient presents with   Follow-up      Pt alone, rm 11. Overall doing well. Recently had a knee surgery, -24-2023. Sleep and pain interference for a while, now improved.   CPAP is stable. He has been getting supplies on [place] and is interested in transfering to another DME. Current is [facility]. Will tx to [facility] since insurance will allow.    Pt states overall memory is stable and not worsening. He did follow with Dr Kieth Brightly.         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 His knee surgery was at [facility], Dr Althea Charon, and now PT at [facility] medicine [facility] rehab[facility] [place].  Dr Kieth Brightly tested him for cognitive deficits as mild degree, but with a significant decrease in comparison to his premorbid baseline. .    08-31-2021. Brian Cortez is a 72 y.o. male, He is doing well on CPAP. Has felt that memory has been better on Aricept. Rectal incontinence, GI upset. Metamucil helps. Has been happy  with memory improvement. See MOCA improved from 22/ 30 to 25 / 30.  Mr. Granja has been 100% compliant CPAP user over the last 30 days with 100% compliance 4 hours as well.  Average user time is 8 hours and 25 minutes.  AutoSet between 5 and 15 cmH2O with 3 cm EPR.  Residual AHI is 1.3.  His 95th percentile pressure is 11.9 and his 95th percentile air leak is 12.4 both are in good shape.  Central apneas have not arisen and the air leak seems to be higher on Sundays or Saturdays, when he had a bit of alcohol intake.    Today is 27 May 2021 and I have the pleasure of meeting today with Mr. Brian Cortez , who is a 72 y.o. male and seen today for MCI and CPAP  compliance.  He has developed Planter fasciitis and significant enough knee pain to  consider a total knee replacement in the near future.  This will be on the right.     Review of Systems: Out of a complete 14 system review, the patient complains of only the following symptoms, and all other reviewed systems are negative.:   I am fatigued at 63/ 63 points, and EDS at 1/ 24 points.     How likely are you to doze in the following situations: 0 = not likely, 1 = slight chance, 2 = moderate chance, 3 = high chance   Sitting and Reading? Watching Television? Sitting inactive in a public place (theater or meeting)? As a passenger in a car for an hour without a break? Lying down in the afternoon when circumstances permit? Sitting and talking to someone? Sitting quietly after lunch without alcohol? In a car, while stopped for a few minutes in traffic?   I am fatigued at 63/ 63 points, and EDS at 1/ 24 points.    Social History   Socioeconomic History   Marital status: Married    Spouse name: [name]   Number of children: 2   Years of education: Pharmacist, community   Highest education level: Not on file  Occupational History   Occupation: BANK -PRESIDENT    Employer: [employer]   Occupation: PRESIDENT    Employer: [employer]  Tobacco Use   Smoking status: Never   Smokeless tobacco: Never  Vaping Use   Vaping Use: Never used  Substance and Sexual Activity   Alcohol use: Yes    Alcohol/week: 0.0 standard drinks of alcohol    Comment: occasional   Drug use: No   Sexual activity: Not on file    Comment: VASECTOMY  Other Topics Concern   Not on file  Social History Narrative   Patient lives at home with spouse.    Caffeine Use: 3-4 cups   Social Determinants of Health   Financial Resource Strain: Not on file  Food Insecurity: Not on file  Transportation Needs: Not on file  Physical Activity: Not on file  Stress: Not on file  Social Connections: Not on file    Family History  Problem Relation Age of Onset   Heart attack Father 33   Stroke Father 44    Heart attack Paternal Grandfather        in 54s   Prostate cancer Paternal Uncle    Heart attack Paternal Uncle         > 55   Heart attack Maternal Uncle 80       > 55   Colon cancer Neg Hx  Diabetes Neg Hx     Past Medical History:  Diagnosis Date   Anxiety    Bladder tumor    Carpal tunnel syndrome, left    intermittant   Chronic seasonal allergic rhinitis    Coronary artery disease    Coronary calcium score is very high at 1582   DDD (degenerative disc disease), cervical    Dilated cardiomyopathy (HCC)    Diverticulosis of colon    Fecal incontinence    GERD (gastroesophageal reflux disease)    Grade II diastolic dysfunction 06/17/2017   per ECHO    Hemorrhoids    Hiatal hernia    History of adenomatous polyp of colon    2001;  2007;  2012   History of bladder cancer urologist-  dr Patsi Sears   11-06-2013  s/p TURBT and BCG tx's   History of kidney stones    about 45 years ago   History of panic attacks    History of urethral stricture    Hyperlipidemia    Hypertension    Hypogonadism in male    LVH (left ventricular hypertrophy) 06/17/2017   Mild, noted on ECHO   Nonischemic cardiomyopathy [facility])    cardiologist-  dr Shirlee Latch-- per last echo 01/ 2016 ef 50-55%  (up from 09/ 2014 ef 45-50%)   OSA on CPAP    since 2014   Pneumonia 11/2010   Left lower lobe   Pulmonary hypertension (HCC) 06/17/2017   Mild, per ECHO    Past Surgical History:  Procedure Laterality Date   CARDIOVASCULAR STRESS TEST  08-21-2013  dr Shirlee Latch   normal nuclear study w/ no ischemia/  normal LV function and wall motion , ef 63%   CATARACT EXTRACTION W/ INTRAOCULAR LENS  IMPLANT, BILATERAL  2013   COLONOSCOPY  last one 03-03-2016   CYSTO/  DIRECT VISION INTERNAL URETEROTOMY  11/25/2006   LAPAROSCOPIC CHOLECYSTECTOMY  10/15/2006   MENISCUS REPAIR Right 12/2020   ORBITAL FRACTURE SURGERY  1986   left eye:  Jan 1986--- fixation of fractures, repair of optic nerve decompression , and  complex closure   TONSILLECTOMY  child   TRANSTHORACIC ECHOCARDIOGRAM  12-16-2014   dr Shirlee Latch   grade 1 diastolic dysfunction, ef 50-55% (improved from last study in 2014 , previous ef 45-50%) /  trivial MR and TR/  mild LAE/  inferior vena cava dilated, respirophasic diameter changes were blunted (<50%), consistant w/ elevated central venous pressure   TRANSURETHRAL RESECTION OF BLADDER TUMOR Bilateral 05/16/2018   Procedure: TRANSURETHRAL RESECTION OF BLADDER TUMOR (TURBT)/ RETROGRADE;  Surgeon: Jerilee Field, MD;  Location: [facility] [place];  Service: Urology;  Laterality: Bilateral;   TRANSURETHRAL RESECTION OF BLADDER TUMOR WITH GYRUS (TURBT-GYRUS) N/A 11/06/2013   Procedure: TRANSURETHRAL RESECTION OF BLADDER TUMOR WITH GYRUS (TURBT-GYRUS);  Surgeon: Kathi Ludwig, MD;  Location: [facility];  Service: Urology;  Laterality: N/A;   TRANSURETHRAL RESECTION OF BLADDER TUMOR WITH MITOMYCIN-C N/A 12/09/2016   Procedure: CYSTOSCOPY TRANSURETHRAL RESECTION OF BLADDER TUMOR WITH MITOMYCIN-C;  Surgeon: Jethro Bolus, MD;  Location: [facility] [place];  Service: Urology;  Laterality: N/A;   VARICOSE VEIN SURGERY  2002   VASECTOMY       Current Outpatient Medications on File Prior to Visit  Medication Sig Dispense Refill   allopurinol (ZYLOPRIM) 300 MG tablet Take 300 mg by mouth daily.     anastrozole (ARIMIDEX) 1 MG tablet Take 2 mg by mouth daily.     aspirin 81 MG EC tablet Take 1  tablet by mouth daily.     bimatoprost (LUMIGAN) 0.01 % SOLN INSTILL ONE DROP IN [facility] EYE AT BEDTIME     donepezil (ARICEPT ODT) 10 MG disintegrating tablet Take 1 tablet (10 mg total) by mouth at bedtime. 90 tablet 3   escitalopram (LEXAPRO) 20 MG tablet Take 20 mg by mouth every morning.      fenofibrate 160 MG tablet Take 1 tablet (160 mg total) by mouth daily. 90 tablet 2   fexofenadine (ALLEGRA) 180 MG tablet Take 1 tablet by mouth as needed.     icosapent Ethyl (VASCEPA) 1 g capsule  Take 2 capsules (2 g total) by mouth 2 (two) times daily. 360 capsule 2   Multiple Vitamins-Minerals (PRESERVISION AREDS 2 PO) Take 1 capsule by mouth 2 (two) times daily.      pantoprazole (PROTONIX) 40 MG tablet TAKE ONE TABLET DAILY 90 tablet 1   rosuvastatin (CRESTOR) 40 MG tablet TAKE ONE TABLET BY MOUTH ONCE DAILY 90 tablet 2   testosterone cypionate (DEPOTESTOSTERONE CYPIONATE) 200 MG/ML injection Inject 0.5 mg into the muscle.     amoxicillin (AMOXIL) 500 MG capsule Take 2,000 mg by mouth daily. Before dentist (Patient not taking: Reported on 05/16/2023)     COVID-19 mRNA vaccine 2023-2024 (COMIRNATY) SUSP injection Inject into the muscle. (Patient not taking: Reported on 05/16/2023) 0.3 mL 0   No current facility-administered medications on file prior to visit.    Allergies  Allergen Reactions   Bee Pollen     Other reaction(s): Cough "sinus issues"   Pollen Extract Cough    "sinus issues"   Short Ragweed Pollen Ext Itching and Shortness Of Breath   Oxycodone Itching   Pine Tar Itching and Other (See Comments)    Sinus congestion   Sulfamethoxazole Nausea And Vomiting   Sulfamethoxazole-Trimethoprim Rash     DIAGNOSTIC DATA (LABS, IMAGING, TESTING) - I reviewed patient records, labs, notes, testing and imaging myself where available.  Lab Results  Component Value Date   WBC 8.9 03/20/2022   HGB 12.7 (L) 03/20/2022   HCT 39.3 03/20/2022   MCV 93.6 03/20/2022   PLT 238 03/20/2022      Component Value Date/Time   NA 137 03/20/2022 2009   NA 138 01/26/2021 1428   K 3.7 03/20/2022 2009   CL 101 03/20/2022 2009   CO2 29 03/20/2022 2009   GLUCOSE 106 (H) 03/20/2022 2009   BUN 27 (H) 03/20/2022 2009   BUN 20 01/26/2021 1428   CREATININE 1.30 (H) 03/20/2022 2009   CALCIUM 8.7 (L) 03/20/2022 2009   PROT 6.6 03/20/2022 2009   PROT 7.1 01/26/2021 1428   ALBUMIN 3.4 (L) 03/20/2022 2009   ALBUMIN 4.7 01/26/2021 1428   AST 37 03/20/2022 2009   ALT 24 03/20/2022 2009    ALKPHOS 42 03/20/2022 2009   BILITOT 1.3 (H) 03/20/2022 2009   BILITOT 0.8 01/26/2021 1428   GFRNONAA 59 (L) 03/20/2022 2009   GFRAA 75 (L) 11/06/2013 1515   Lab Results  Component Value Date   CHOL 138 02/25/2023   HDL 40 02/25/2023   LDLCALC 60 02/25/2023   LDLDIRECT 67.7 09/11/2014   TRIG 235 (H) 02/25/2023   CHOLHDL 3.5 02/25/2023   Lab Results  Component Value Date   HGBA1C 5.7 01/30/2010   No results found for: "VITAMINB12" Lab Results  Component Value Date   TSH 2.918 08/07/2013    PHYSICAL EXAM:  Today's Vitals   05/16/23 1334  Weight: 267 lb (121.1 kg)  Height: 6\' 3"  (1.905 m)   Body mass index is 33.37 kg/m.   Wt Readings from Last 3 Encounters:  05/16/23 267 lb (121.1 kg)  12/21/22 276 lb 12.8 oz (125.6 kg)  05/13/22 273 lb (123.8 kg)     Ht Readings from Last 3 Encounters:  05/16/23 6\' 3"  (1.905 m)  12/21/22 6\' 3"  (1.905 m)  05/13/22 6\' 3"  (1.905 m)      General: The patient is awake, alert and appears  in acute distress from hay fever and sinusitis. Nasal congestion is noted.  The patient is well groomed. He has visible pressure marks.  Head: Normocephalic, atraumatic. Neck is supple. Circumference at 19- inches   Circumference, from 21" .   Mallampati 3 plus - large neck, small opening   Sinus congestion. Retrognathia is noted .  Cardiovascular: Regular rate and rhythm, without murmurs or carotid bruit, and without distended neck veins. Respiratory: Lungs are clear to auscultation. He has faster RR 23- min.  Skin:   Ankle edema, affecting the ankle sensation for vibration.  Varicose veins visible in both lower extremities.  Trunk: BMI reduced to 33.87,  he has an erect posture.   Neurologic exam : The patient is awake and alert, oriented to place and time.   Memory subjective  described as improved.     05/16/2023    1:36 PM 05/13/2022    9:29 AM 08/31/2021    1:15 PM 05/27/2021    1:28 PM  Montreal Cognitive Assessment   Visuospatial/  Executive (0/5) 3 5 4 3   Naming (0/3) 3 3 3 3   Attention: Read list of digits (0/2) 2 1 1 2   Attention: Read list of letters (0/1) 1 1 1 1   Attention: Serial 7 subtraction starting at 100 (0/3) 3 3 2 2   Language: Repeat phrase (0/2) 2 2 2  0  Language : Fluency (0/1) 1 1 1 1   Abstraction (0/2) 2 2 2 2   Delayed Recall (0/5) 3 5 3 2   Orientation (0/6) 5 6 6 6   Total 25 29 25 22        Cranial nerves: no loss of smell or taste- Pupils are equally round and reactive to light.  Status post cataract.  Fully vaccinated.  Nystagmus with gaze to the left , unrestricted visual field.  Facial motor strength is symmetric and his tongue and uvula move midline. Hearing intact.  DTR intact, symmetrical. Babinski downgoing.      ASSESSMENT AND PLAN 72 y.o. year old male  here with:    1) MCI , 25/ 30 points on MOCA- detail was missing, the cube pointed in the other direction, the clock face had 4 numbers- almost 27/ 30 .  Will refill the generic form of Aricept.   2) sweating is resolved , was caused by testosterone.   3) CPAP works well. 100% compliance, needs yearly follow-up.   I plan to follow up either personally or through our NP within 12 months.   I would like to thank  Chilton Greathouse, Md [address] [place],  [place] [address] for allowing me to meet with and to take care of this pleasant patient.   CC: I will share my notes with Dr Felipa Eth.  After spending a total time of  29  minutes face to face and additional time for physical and neurologic examination, review of laboratory studies,  personal review of imaging studies, reports and results of other testing and review of referral information / records as far as  provided in visit,   Electronically signed by: Melvyn Novas, MD 05/16/2023 1:43 PM  [facility] Board certified by The [employer] of Sleep Medicine and Diplomate of the [employer] of Sleep Medicine. Board certified In  Neurology through the ABPN, Fellow of the [employer] of Neurology.

## 2023-05-18 DIAGNOSIS — E291 Testicular hypofunction: Secondary | ICD-10-CM | POA: Diagnosis not present

## 2023-05-20 DIAGNOSIS — M545 Low back pain, unspecified: Secondary | ICD-10-CM | POA: Diagnosis not present

## 2023-05-25 DIAGNOSIS — E291 Testicular hypofunction: Secondary | ICD-10-CM | POA: Diagnosis not present

## 2023-05-30 ENCOUNTER — Emergency Department (HOSPITAL_BASED_OUTPATIENT_CLINIC_OR_DEPARTMENT_OTHER): Payer: Medicare HMO

## 2023-05-30 ENCOUNTER — Other Ambulatory Visit: Payer: Self-pay

## 2023-05-30 ENCOUNTER — Emergency Department (HOSPITAL_BASED_OUTPATIENT_CLINIC_OR_DEPARTMENT_OTHER)
Admission: EM | Admit: 2023-05-30 | Discharge: 2023-05-30 | Disposition: A | Discharge status: Home / Self Care | Payer: Medicare HMO | Attending: Emergency Medicine | Admitting: Emergency Medicine

## 2023-05-30 ENCOUNTER — Encounter (HOSPITAL_BASED_OUTPATIENT_CLINIC_OR_DEPARTMENT_OTHER): Payer: Self-pay | Admitting: Emergency Medicine

## 2023-05-30 DIAGNOSIS — N200 Calculus of kidney: Secondary | ICD-10-CM | POA: Diagnosis not present

## 2023-05-30 DIAGNOSIS — Z79899 Other long term (current) drug therapy: Secondary | ICD-10-CM | POA: Diagnosis not present

## 2023-05-30 DIAGNOSIS — Z7982 Long term (current) use of aspirin: Secondary | ICD-10-CM | POA: Insufficient documentation

## 2023-05-30 DIAGNOSIS — R109 Unspecified abdominal pain: Secondary | ICD-10-CM | POA: Diagnosis not present

## 2023-05-30 DIAGNOSIS — I130 Hypertensive heart and chronic kidney disease with heart failure and stage 1 through stage 4 chronic kidney disease, or unspecified chronic kidney disease: Secondary | ICD-10-CM | POA: Diagnosis not present

## 2023-05-30 DIAGNOSIS — I5030 Unspecified diastolic (congestive) heart failure: Secondary | ICD-10-CM | POA: Diagnosis not present

## 2023-05-30 DIAGNOSIS — K5730 Diverticulosis of large intestine without perforation or abscess without bleeding: Secondary | ICD-10-CM | POA: Diagnosis not present

## 2023-05-30 DIAGNOSIS — Z8551 Personal history of malignant neoplasm of bladder: Secondary | ICD-10-CM | POA: Diagnosis not present

## 2023-05-30 DIAGNOSIS — N1832 Chronic kidney disease, stage 3b: Secondary | ICD-10-CM | POA: Insufficient documentation

## 2023-05-30 DIAGNOSIS — M545 Low back pain, unspecified: Secondary | ICD-10-CM | POA: Diagnosis not present

## 2023-05-30 DIAGNOSIS — K859 Acute pancreatitis without necrosis or infection, unspecified: Secondary | ICD-10-CM | POA: Diagnosis not present

## 2023-05-30 DIAGNOSIS — I2510 Atherosclerotic heart disease of native coronary artery without angina pectoris: Secondary | ICD-10-CM | POA: Diagnosis not present

## 2023-05-30 DIAGNOSIS — K852 Alcohol induced acute pancreatitis without necrosis or infection: Secondary | ICD-10-CM | POA: Insufficient documentation

## 2023-05-30 DIAGNOSIS — M47816 Spondylosis without myelopathy or radiculopathy, lumbar region: Secondary | ICD-10-CM | POA: Diagnosis not present

## 2023-05-30 DIAGNOSIS — C679 Malignant neoplasm of bladder, unspecified: Secondary | ICD-10-CM | POA: Diagnosis not present

## 2023-05-30 LAB — URINALYSIS, ROUTINE W REFLEX MICROSCOPIC
Bacteria, UA: NONE SEEN
Bilirubin Urine: NEGATIVE
Glucose, UA: 100 mg/dL — AB
Ketones, ur: NEGATIVE mg/dL
Nitrite: NEGATIVE
Specific Gravity, Urine: 1.022 (ref 1.005–1.030)
pH: 6 (ref 5.0–8.0)

## 2023-05-30 LAB — CBC WITH DIFFERENTIAL/PLATELET
Abs Immature Granulocytes: 0.05 10*3/uL (ref 0.00–0.07)
Basophils Absolute: 0 10*3/uL (ref 0.0–0.1)
Basophils Relative: 0 %
Eosinophils Absolute: 0.1 10*3/uL (ref 0.0–0.5)
Eosinophils Relative: 1 %
HCT: 51.3 % (ref 39.0–52.0)
Hemoglobin: 17.4 g/dL — ABNORMAL HIGH (ref 13.0–17.0)
Immature Granulocytes: 1 %
Lymphocytes Relative: 15 %
Lymphs Abs: 1.4 10*3/uL (ref 0.7–4.0)
MCH: 31.6 pg (ref 26.0–34.0)
MCHC: 33.9 g/dL (ref 30.0–36.0)
MCV: 93.3 fL (ref 80.0–100.0)
Monocytes Absolute: 0.9 10*3/uL (ref 0.1–1.0)
Monocytes Relative: 9 %
Neutro Abs: 7 10*3/uL (ref 1.7–7.7)
Neutrophils Relative %: 74 %
Platelets: 197 10*3/uL (ref 150–400)
RBC: 5.5 MIL/uL (ref 4.22–5.81)
RDW: 13.9 % (ref 11.5–15.5)
WBC: 9.5 10*3/uL (ref 4.0–10.5)
nRBC: 0 % (ref 0.0–0.2)

## 2023-05-30 LAB — COMPREHENSIVE METABOLIC PANEL
ALT: 34 U/L (ref 0–44)
AST: 34 U/L (ref 15–41)
Albumin: 4.6 g/dL (ref 3.5–5.0)
Alkaline Phosphatase: 42 U/L (ref 38–126)
Anion gap: 8 (ref 5–15)
BUN: 29 mg/dL — ABNORMAL HIGH (ref 8–23)
CO2: 28 mmol/L (ref 22–32)
Calcium: 9.8 mg/dL (ref 8.9–10.3)
Chloride: 98 mmol/L (ref 98–111)
Creatinine, Ser: 1.31 mg/dL — ABNORMAL HIGH (ref 0.61–1.24)
GFR, Estimated: 58 mL/min — ABNORMAL LOW (ref >60)
Glucose, Bld: 133 mg/dL — ABNORMAL HIGH (ref 70–99)
Potassium: 4.6 mmol/L (ref 3.5–5.1)
Sodium: 134 mmol/L — ABNORMAL LOW (ref 135–145)
Total Bilirubin: 0.8 mg/dL (ref 0.3–1.2)
Total Protein: 7.1 g/dL (ref 6.5–8.1)

## 2023-05-30 LAB — LIPASE, BLOOD: Lipase: 29 U/L (ref 11–51)

## 2023-05-30 MED ORDER — TRAMADOL HCL 50 MG PO TABS: 50.0000 mg | ORAL_TABLET | Freq: Four times a day (QID) | ORAL | 0 refills | Status: DC | PRN

## 2023-05-30 MED ORDER — FAMOTIDINE IN NACL 20-0.9 MG/50ML-% IV SOLN
20.0000 mg | Freq: Once | INTRAVENOUS | Status: AC
  Administered 2023-05-30: 20 mg via INTRAVENOUS
  Filled 2023-05-30: qty 50

## 2023-05-30 MED ORDER — MORPHINE SULFATE (PF) 4 MG/ML IV SOLN
4.0000 mg | Freq: Once | INTRAVENOUS | Status: AC
  Administered 2023-05-30: 4 mg via INTRAVENOUS
  Filled 2023-05-30: qty 1

## 2023-05-30 MED ORDER — ONDANSETRON HCL 4 MG PO TABS: 4.0000 mg | ORAL_TABLET | ORAL | 0 refills | Status: DC | PRN

## 2023-05-30 MED ORDER — ONDANSETRON HCL 4 MG/2ML IJ SOLN
4.0000 mg | Freq: Once | INTRAMUSCULAR | Status: AC
  Administered 2023-05-30: 4 mg via INTRAVENOUS
  Filled 2023-05-30: qty 2

## 2023-05-30 MED ORDER — IOHEXOL 300 MG/ML  SOLN
100.0000 mL | Freq: Once | INTRAMUSCULAR | Status: AC | PRN
  Administered 2023-05-30: 80 mL via INTRAVENOUS

## 2023-05-30 MED ORDER — SODIUM CHLORIDE 0.9 % IV BOLUS
1000.0000 mL | Freq: Once | INTRAVENOUS | Status: AC
  Administered 2023-05-30: 1000 mL via INTRAVENOUS

## 2023-05-30 NOTE — ED Triage Notes (Signed)
Pt arrives pov, steady gait reports concern for "severe kidney infection". Pt c/o bilateral lower back pain radiating to lower ABD x 1 week. Advil 800mg  at 0630

## 2023-05-30 NOTE — ED Provider Notes (Signed)
[place] EMERGENCY DEPARTMENT AT [facility] Provider Note  CSN: [id number] Arrival date & time: 05/30/23 1035  Chief Complaint(s) No chief complaint on file.  HPI Brian Cortez is a 72 y.o. male with past medical history as below, significant for Bladder cancer, DDD, GERD, diastolic heart failure, dilated cardiomyopathy, nephrolithiasis LD, HD and, who presents to the ED with complaint of flank/back pain.  Symptom onset around a week ago, pain to bilateral flanks, gradually worsening, difficulty with ambulation secondary to the pain.  No change in bowel or bladder function, no nausea or vomiting.  No chest pain or dyspnea.  No numbness or tingling to extremities.  No recent falls or injuries.  He follows with urology at [facility], he gets frequent cystoscopy given history of bladder cancer.  He called his urologist who advised him to go to his PCP as there is concern for possible kidney infection, PCP was unable to see him today so he came to the ED for evaluation  Follows with Dr. Patsi Sears urology [facility]  Past Medical History Past Medical History:  Diagnosis Date   Anxiety    Bladder tumor    Carpal tunnel syndrome, left    intermittant   Chronic seasonal allergic rhinitis    Coronary artery disease    Coronary calcium score is very high at 1582   DDD (degenerative disc disease), cervical    Dilated cardiomyopathy (HCC)    Diverticulosis of colon    Fecal incontinence    GERD (gastroesophageal reflux disease)    Grade II diastolic dysfunction 06/17/2017   per ECHO    Hemorrhoids    Hiatal hernia    History of adenomatous polyp of colon    2001;  2007;  2012   History of bladder cancer urologist-  dr Patsi Sears   11-06-2013  s/p TURBT and BCG tx's   History of kidney stones    about 45 years ago   History of panic attacks    History of urethral stricture    Hyperlipidemia    Hypertension    Hypogonadism in male    LVH (left ventricular hypertrophy)  06/17/2017   Mild, noted on ECHO   Nonischemic cardiomyopathy [facility])    cardiologist-  dr Shirlee Latch-- per last echo 01/ 2016 ef 50-55%  (up from 09/ 2014 ef 45-50%)   OSA on CPAP    since 2014   Pneumonia 11/2010   Left lower lobe   Pulmonary hypertension (HCC) 06/17/2017   Mild, per ECHO   Patient Active Problem List   Diagnosis Date Noted   MCI (mild cognitive impairment) 05/16/2023   Primary open-angle glaucoma (POAG), moderate stage 05/13/2022   History of vitrectomy 04/08/2022   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   Chronic kidney disease, stage 3b (HCC) 05/15/2021   COVID-19 05/15/2021   Mild cognitive disorder 05/15/2021   Bilateral impacted cerumen 02/13/2021   Deviated septum 02/13/2021   Epistaxis 02/13/2021   Myopic macular degeneration 04/02/2020   Posterior vitreous detachment of right eye 04/02/2020   Early stage nonexudative age-related macular degeneration of both eyes 04/02/2020   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 04/02/2020   Low back pain 03/04/2020   Other specified local infections of the skin and subcutaneous tissue 08/20/2019   Secondary polycythemia 08/06/2019   Atherosclerosis of coronary artery 02/07/2019   Tinea cruris 10/16/2018   Neoplastic malignant related fatigue 09/20/2018   Convalescence after chemotherapy 09/20/2018   Anterior urethral stricture 08/04/2018  Bladder trabeculation 08/04/2018   Diverticula, bladder acquired 08/04/2018   Erectile dysfunction due to diseases classified elsewhere 08/04/2018   Low testosterone in male 08/04/2018   Primary open angle glaucoma (POAG) of both eyes, moderate stage 02/10/2018   Obesity with body mass index greater than 30 09/14/2017   Incontinence of feces 04/22/2017   Pain in right knee 01/25/2017   Nasal congestion with rhinorrhea 09/08/2016   Chronic seasonal allergic rhinitis due to pollen 09/08/2016   Impaired fasting glucose 12/10/2015   Postherpetic  neuralgia 11/12/2015   Herpes zoster without complication 11/04/2015   Acute recurrent ethmoidal sinusitis 09/09/2015   Bronchitis 09/09/2015   OSA on CPAP 09/09/2015   Rhinitis due to pollen 09/09/2015   Plantar fascial fibromatosis 11/25/2014   CAP (community acquired pneumonia) 10/15/2014   Allergic rhinitis 10/15/2014   Atelectasis 09/30/2014   Abnormal weight loss 09/06/2014   Cough 09/06/2014   CPAP rhinitis 08/27/2014   Anxiety disorder 03/15/2014   Colorectal polyps 03/15/2014   Glaucoma 03/15/2014   Screening for depression 03/15/2014   Male hypogonadism 03/15/2014   Other ill-defined heart diseases 03/15/2014   Bladder cancer (HCC) 11/06/2013   Sleep apnea with use of continuous positive airway pressure (CPAP) 08/23/2013   Coronary artery calcification seen on CAT scan 08/17/2013   Cardiomyopathy (HCC) 08/17/2013   Diastolic dysfunction 08/07/2013   Testosterone deficiency 10/18/2012   CARPAL TUNNEL SYNDROME, LEFT 03/12/2010   CERVICAL RADICULOPATHY 03/12/2010   NONSPECIFIC ABNORMAL ELECTROCARDIOGRAM 10/08/2009   ADENOMATOUS COLONIC POLYP 04/06/2008   DIVERTICULOSIS, COLON 04/06/2008   HIATAL HERNIA 12/18/2007   HYPERLIPIDEMIA 10/20/2007   Essential hypertension 05/30/2007   G E R D 05/30/2007   HYPERGLYCEMIA 12/19/2006   Home Medication(s) Prior to Admission medications   Medication Sig Start Date End Date Taking? Authorizing Provider  ondansetron (ZOFRAN) 4 MG tablet Take 1 tablet (4 mg total) by mouth every 4 (four) hours as needed for nausea or vomiting. 05/30/23  Yes Sloan Leiter, DO  traMADol (ULTRAM) 50 MG tablet Take 1 tablet (50 mg total) by mouth every 6 (six) hours as needed. 05/30/23  Yes Tanda Rockers A, DO  allopurinol (ZYLOPRIM) 300 MG tablet Take 300 mg by mouth daily. 05/28/19   [provider]  amoxicillin (AMOXIL) 500 MG capsule Take 2,000 mg by mouth daily. Before dentist Patient not taking: Reported on 05/16/2023 12/06/22 12/14/23  [provider]  anastrozole (ARIMIDEX) 1 MG tablet Take 2 mg by mouth daily. 12/05/20   [provider]  aspirin 81 MG EC tablet Take 1 tablet by mouth daily.    [provider]  bimatoprost (LUMIGAN) 0.01 % SOLN INSTILL ONE DROP IN [facility] EYE AT BEDTIME 07/31/18   [provider]  COVID-19 mRNA vaccine 2023-2024 (COMIRNATY) SUSP injection Inject into the muscle. Patient not taking: Reported on 05/16/2023 09/02/22   Judyann Munson, MD  donepezil (ARICEPT ODT) 10 MG disintegrating tablet Take 1 tablet (10 mg total) by mouth at bedtime. 05/16/23   Dohmeier, Porfirio Mylar, MD  escitalopram (LEXAPRO) 20 MG tablet Take 20 mg by mouth every morning.     [provider]  fenofibrate 160 MG tablet Take 1 tablet (160 mg total) by mouth daily. 03/31/23   Quintella Reichert, MD  fexofenadine (ALLEGRA) 180 MG tablet Take 1 tablet by mouth as needed. 02/16/21   [provider]  icosapent Ethyl (VASCEPA) 1 g capsule Take 2 capsules (2 g total) by mouth 2 (two) times daily. 02/21/23   Armanda Magic  R, MD  Multiple Vitamins-Minerals (PRESERVISION AREDS 2 PO) Take 1 capsule by mouth 2 (two) times daily.     [provider]  pantoprazole (PROTONIX) 40 MG tablet TAKE ONE TABLET DAILY 03/18/23   Hilarie Fredrickson, MD  rosuvastatin (CRESTOR) 40 MG tablet TAKE ONE TABLET BY MOUTH ONCE DAILY 04/12/23   Quintella Reichert, MD  testosterone cypionate (DEPOTESTOSTERONE CYPIONATE) 200 MG/ML injection Inject 0.5 mg into the muscle. 01/02/21   [provider]                                                                                                                                    Past Surgical History Past Surgical History:  Procedure Laterality Date   CARDIOVASCULAR STRESS TEST  08-21-2013  dr Shirlee Latch   normal nuclear study w/ no ischemia/  normal LV function and wall motion , ef 63%   CATARACT EXTRACTION W/ INTRAOCULAR LENS  IMPLANT, BILATERAL  2013   COLONOSCOPY  last one  03-03-2016   CYSTO/  DIRECT VISION INTERNAL URETEROTOMY  11/25/2006   LAPAROSCOPIC CHOLECYSTECTOMY  10/15/2006   MENISCUS REPAIR Right 12/2020   ORBITAL FRACTURE SURGERY  1986   left eye:  Jan 1986--- fixation of fractures, repair of optic nerve decompression , and complex closure   TONSILLECTOMY  child   TRANSTHORACIC ECHOCARDIOGRAM  12-16-2014   dr Shirlee Latch   grade 1 diastolic dysfunction, ef 50-55% (improved from last study in 2014 , previous ef 45-50%) /  trivial MR and TR/  mild LAE/  inferior vena cava dilated, respirophasic diameter changes were blunted (<50%), consistant w/ elevated central venous pressure   TRANSURETHRAL RESECTION OF BLADDER TUMOR Bilateral 05/16/2018   Procedure: TRANSURETHRAL RESECTION OF BLADDER TUMOR (TURBT)/ RETROGRADE;  Surgeon: Jerilee Field, MD;  Location: [facility];  Service: Urology;  Laterality: Bilateral;   TRANSURETHRAL RESECTION OF BLADDER TUMOR WITH GYRUS (TURBT-GYRUS) N/A 11/06/2013   Procedure: TRANSURETHRAL RESECTION OF BLADDER TUMOR WITH GYRUS (TURBT-GYRUS);  Surgeon: Kathi Ludwig, MD;  Location: [facility];  Service: Urology;  Laterality: N/A;   TRANSURETHRAL RESECTION OF BLADDER TUMOR WITH MITOMYCIN-C N/A 12/09/2016   Procedure: CYSTOSCOPY TRANSURETHRAL RESECTION OF BLADDER TUMOR WITH MITOMYCIN-C;  Surgeon: Jethro Bolus, MD;  Location: [facility];  Service: Urology;  Laterality: N/A;   VARICOSE VEIN SURGERY  2002   VASECTOMY     Family History Family History  Problem Relation Age of Onset   Heart attack Father 3   Stroke Father 21   Heart attack Paternal Grandfather        in 35s   Prostate cancer Paternal Uncle    Heart attack Paternal Uncle         > 55   Heart attack Maternal Uncle 80       > 55   Colon cancer Neg Hx    Diabetes Neg Hx     Social History Social History  Tobacco Use   Smoking status: Never   Smokeless tobacco: Never  Vaping Use   Vaping status: Never Used   Substance Use Topics   Alcohol use: Yes    Alcohol/week: 0.0 standard drinks of alcohol    Comment: occasional   Drug use: No   Allergies Bee pollen, Pollen extract, Short ragweed pollen ext, Oxycodone, Percocet [oxycodone-acetaminophen], Pine tar, Sulfamethoxazole, and Sulfamethoxazole-trimethoprim  Review of Systems Review of Systems  Constitutional:  Negative for chills and fever.  HENT:  Negative for facial swelling and trouble swallowing.   Eyes:  Negative for photophobia and visual disturbance.  Respiratory:  Negative for cough and shortness of breath.   Cardiovascular:  Negative for chest pain and palpitations.  Gastrointestinal:  Negative for abdominal pain, nausea and vomiting.  Endocrine: Negative for polydipsia and polyuria.  Genitourinary:  Positive for flank pain. Negative for difficulty urinating and hematuria.  Musculoskeletal:  Positive for back pain. Negative for gait problem and joint swelling.  Skin:  Negative for pallor and rash.  Neurological:  Negative for syncope and headaches.  Psychiatric/Behavioral:  Negative for agitation and confusion.     Physical Exam Vital Signs  I have reviewed the triage vital signs BP (!) 148/93   Pulse 62   Temp 98.2 F (36.8 C) (Oral)   Resp 18   Wt 123.4 kg   SpO2 96%   BMI 34.00 kg/m  Physical Exam Vitals and nursing note reviewed.  Constitutional:      General: He is not in acute distress.    Appearance: Normal appearance. He is well-developed. He is obese. He is not ill-appearing.  HENT:     Head: Normocephalic and atraumatic.     Right Ear: External ear normal.     Left Ear: External ear normal.     Mouth/Throat:     Mouth: Mucous membranes are moist.  Eyes:     General: No scleral icterus. Cardiovascular:     Rate and Rhythm: Normal rate and regular rhythm.     Pulses: Normal pulses.     Heart sounds: Normal heart sounds.  Pulmonary:     Effort: Pulmonary effort is normal. No respiratory distress.      Breath sounds: Normal breath sounds.  Abdominal:     General: Abdomen is flat.     Palpations: Abdomen is soft.     Tenderness: There is abdominal tenderness. There is right CVA tenderness and left CVA tenderness.  Musculoskeletal:       Arms:     Right lower leg: No edema.     Left lower leg: No edema.  Skin:    General: Skin is warm and dry.     Capillary Refill: Capillary refill takes less than 2 seconds.  Neurological:     Mental Status: He is alert and oriented to person, place, and time.  Psychiatric:        Mood and Affect: Mood normal.        Behavior: Behavior normal.     ED Results and Treatments Labs (all labs ordered are listed, but only abnormal results are displayed) Labs Reviewed  CBC WITH DIFFERENTIAL/PLATELET - Abnormal; Notable for the following components:      Result Value   Hemoglobin 17.4 (*)    All other components within normal limits  COMPREHENSIVE METABOLIC PANEL - Abnormal; Notable for the following components:   Sodium 134 (*)    Glucose, Bld 133 (*)    BUN 29 (*)    Creatinine,  Ser 1.31 (*)    GFR, Estimated 58 (*)    All other components within normal limits  URINALYSIS, ROUTINE W REFLEX MICROSCOPIC - Abnormal; Notable for the following components:   Glucose, UA 100 (*)    Hgb urine dipstick SMALL (*)    Protein, ur TRACE (*)    Leukocytes,Ua TRACE (*)    All other components within normal limits  LIPASE, BLOOD                                                                                                                          Radiology No results found.  Pertinent labs & imaging results that were available during my care of the patient were reviewed by me and considered in my medical decision making (see MDM for details).  Medications Ordered in ED Medications  sodium chloride 0.9 % bolus 1,000 mL ( Intravenous Stopped 05/30/23 1229)  morphine (PF) 4 MG/ML injection 4 mg (4 mg Intravenous Given 05/30/23 1200)  ondansetron (ZOFRAN)  injection 4 mg (4 mg Intravenous Given 05/30/23 1200)  famotidine (PEPCID) IVPB 20 mg premix (0 mg Intravenous Stopped 05/30/23 1409)  iohexol (OMNIPAQUE) 300 MG/ML solution 100 mL (80 mLs Intravenous Contrast Given 05/30/23 1219)                                                                                                                                     Procedures Procedures  (including critical care time)  Medical Decision Making / ED Course    Medical Decision Making:    TANIA FRIELING is a 72 y.o. male Bladder cancer, DDD, GERD, diastolic heart failure, dilated cardiomyopathy, nephrolithiasis LD, HD and,. The complaint involves an extensive differential diagnosis and also carries with it a high risk of complications and morbidity.  Serious etiology was considered. Ddx includes but is not limited to: Differential diagnosis includes but is not exclusive to acute appendicitis, renal colic, testicular torsion, urinary tract infection, prostatitis,  diverticulitis, small bowel obstruction, colitis, abdominal aortic aneurysm, gastroenteritis, constipation etc. Differential diagnosis includes but is not exclusive to musculoskeletal back pain, renal colic, urinary tract infection, pyelonephritis, intra-abdominal causes of back pain, aortic aneurysm or dissection, cauda equina syndrome, sciatica, lumbar disc disease, thoracic disc disease, etc.   Complete initial physical exam performed, notably the patient  was sitting upright on stretcher, HDS, nonperitoneal abdomen.    Reviewed and confirmed nursing  documentation for past medical history, family history, social history.  Vital signs reviewed.    Clinical Course as of 06/02/23 0731  Mon May 30, 2023  1447 CTAP with pancreatitis, he does drink ETOH regularly  [SG]  1505 CTAP: "IMPRESSION: 1. Acute, interstitial/edematous pancreatitis involving the tail the pancreas. No evidence of parenchymal necrosis or peripancreatic fluid  collections. 2. Mild hepatic steatosis. 3. Moderate sigmoid diverticulosis. 4. Moderate right coronary artery calcification. 5. Aortic atherosclerosis.  " [SG]    Clinical Course User Index [SG] Tanda Rockers A, DO   CT concerning for pancreatitis.  Labs stable.  Lipase stable. Pain greatly improved on recheck, tolerant of p.o. intake without difficulty.  Ambulatory with steady gait.  Feeling much better overall. He does drink alcohol regularly, advised to refrain from alcohol use in the future.  This is likely etiology of his pancreatitis. Does not appear to be in acute alcohol withdrawal.  Given outpatient alcohol cessation resources. Analgesic for home, follow-up with PCP was encouraged  The patient improved significantly and was discharged in stable condition. Detailed discussions were had with the patient regarding current findings, and need for close f/u with PCP or on call doctor. The patient has been instructed to return immediately if the symptoms worsen in any way for re-evaluation. Patient verbalized understanding and is in agreement with current care plan. All questions answered prior to discharge.      Additional history obtained: -Additional history obtained from na -External records from outside source obtained and reviewed including: Chart review including previous notes, labs, imaging, consultation notes including primary care documentation, prior labs and imaging, home medications   Lab Tests: -I ordered, reviewed, and interpreted labs.   The pertinent results include:   Labs Reviewed  CBC WITH DIFFERENTIAL/PLATELET - Abnormal; Notable for the following components:      Result Value   Hemoglobin 17.4 (*)    All other components within normal limits  COMPREHENSIVE METABOLIC PANEL - Abnormal; Notable for the following components:   Sodium 134 (*)    Glucose, Bld 133 (*)    BUN 29 (*)    Creatinine, Ser 1.31 (*)    GFR, Estimated 58 (*)    All other components  within normal limits  URINALYSIS, ROUTINE W REFLEX MICROSCOPIC - Abnormal; Notable for the following components:   Glucose, UA 100 (*)    Hgb urine dipstick SMALL (*)    Protein, ur TRACE (*)    Leukocytes,Ua TRACE (*)    All other components within normal limits  LIPASE, BLOOD    Notable for stable labs  EKG   EKG Interpretation Date/Time:    Ventricular Rate:    PR Interval:    QRS Duration:    QT Interval:    QTC Calculation:   R Axis:      Text Interpretation:           Imaging Studies ordered: I ordered imaging studies including ctap I independently visualized the following imaging with scope of interpretation limited to determining acute life threatening conditions related to emergency care; findings noted above, significant for pancreatitis I independently visualized and interpreted imaging. I agree with the radiologist interpretation   Medicines ordered and prescription drug management: Meds ordered this encounter  Medications   sodium chloride 0.9 % bolus 1,000 mL   morphine (PF) 4 MG/ML injection 4 mg   ondansetron (ZOFRAN) injection 4 mg   famotidine (PEPCID) IVPB 20 mg premix   iohexol (OMNIPAQUE) 300 MG/ML solution 100 mL  traMADol (ULTRAM) 50 MG tablet    Sig: Take 1 tablet (50 mg total) by mouth every 6 (six) hours as needed.    Dispense:  15 tablet    Refill:  0   ondansetron (ZOFRAN) 4 MG tablet    Sig: Take 1 tablet (4 mg total) by mouth every 4 (four) hours as needed for nausea or vomiting.    Dispense:  12 tablet    Refill:  0    -I have reviewed the patients home medicines and have made adjustments as needed   Consultations Obtained: na   Cardiac Monitoring: The patient was maintained on a cardiac monitor.  I personally viewed and interpreted the cardiac monitored which showed an underlying rhythm of: NSR  Social Determinants of Health:  Diagnosis or treatment significantly limited by social determinants of health: obesity and  alcohol use   Reevaluation: After the interventions noted above, I reevaluated the patient and found that they have improved  Co morbidities that complicate the patient evaluation  Past Medical History:  Diagnosis Date   Anxiety    Bladder tumor    Carpal tunnel syndrome, left    intermittant   Chronic seasonal allergic rhinitis    Coronary artery disease    Coronary calcium score is very high at 1582   DDD (degenerative disc disease), cervical    Dilated cardiomyopathy (HCC)    Diverticulosis of colon    Fecal incontinence    GERD (gastroesophageal reflux disease)    Grade II diastolic dysfunction 06/17/2017   per ECHO    Hemorrhoids    Hiatal hernia    History of adenomatous polyp of colon    2001;  2007;  2012   History of bladder cancer urologist-  dr Patsi Sears   11-06-2013  s/p TURBT and BCG tx's   History of kidney stones    about 45 years ago   History of panic attacks    History of urethral stricture    Hyperlipidemia    Hypertension    Hypogonadism in male    LVH (left ventricular hypertrophy) 06/17/2017   Mild, noted on ECHO   Nonischemic cardiomyopathy [facility])    cardiologist-  dr Shirlee Latch-- per last echo 01/ 2016 ef 50-55%  (up from 09/ 2014 ef 45-50%)   OSA on CPAP    since 2014   Pneumonia 11/2010   Left lower lobe   Pulmonary hypertension (HCC) 06/17/2017   Mild, per ECHO      Dispostion: Disposition decision including need for hospitalization was considered, and patient discharged from emergency department.    Final Clinical Impression(s) / ED Diagnoses Final diagnoses:  Alcohol-induced acute pancreatitis without infection or necrosis     This chart was dictated using voice recognition software.  Despite best efforts to proofread,  errors can occur which can change the documentation meaning.    Sloan Leiter, DO 06/02/23 [phone]

## 2023-05-30 NOTE — ED Notes (Signed)
Pt discharged home and given discharge paperwork and prescriptions reviewed. Opportunities given for questions. Pt verbalizes understanding. PIV removed x1. Jillyn Hidden , RN

## 2023-05-30 NOTE — Discharge Instructions (Addendum)
Please strongly consider alcohol cessation/reduction.  This is likely contributing to your pain/pancreatitis.  Please follow up with your PCP for recheck  It was a pleasure caring for you today in the emergency department.  Please return to the emergency department for any worsening or worrisome symptoms.

## 2023-06-01 DIAGNOSIS — E291 Testicular hypofunction: Secondary | ICD-10-CM | POA: Diagnosis not present

## 2023-06-08 DIAGNOSIS — E291 Testicular hypofunction: Secondary | ICD-10-CM | POA: Diagnosis not present

## 2023-06-15 DIAGNOSIS — D1801 Hemangioma of skin and subcutaneous tissue: Secondary | ICD-10-CM | POA: Diagnosis not present

## 2023-06-15 DIAGNOSIS — D225 Melanocytic nevi of trunk: Secondary | ICD-10-CM | POA: Diagnosis not present

## 2023-06-15 DIAGNOSIS — L820 Inflamed seborrheic keratosis: Secondary | ICD-10-CM | POA: Diagnosis not present

## 2023-06-15 DIAGNOSIS — L570 Actinic keratosis: Secondary | ICD-10-CM | POA: Diagnosis not present

## 2023-06-15 DIAGNOSIS — E291 Testicular hypofunction: Secondary | ICD-10-CM | POA: Diagnosis not present

## 2023-06-15 DIAGNOSIS — L812 Freckles: Secondary | ICD-10-CM | POA: Diagnosis not present

## 2023-06-15 DIAGNOSIS — D485 Neoplasm of uncertain behavior of skin: Secondary | ICD-10-CM | POA: Diagnosis not present

## 2023-06-16 DIAGNOSIS — I129 Hypertensive chronic kidney disease with stage 1 through stage 4 chronic kidney disease, or unspecified chronic kidney disease: Secondary | ICD-10-CM | POA: Diagnosis not present

## 2023-06-16 DIAGNOSIS — R8281 Pyuria: Secondary | ICD-10-CM | POA: Diagnosis not present

## 2023-06-16 DIAGNOSIS — F1010 Alcohol abuse, uncomplicated: Secondary | ICD-10-CM | POA: Diagnosis not present

## 2023-06-16 DIAGNOSIS — R109 Unspecified abdominal pain: Secondary | ICD-10-CM | POA: Diagnosis not present

## 2023-06-16 DIAGNOSIS — D751 Secondary polycythemia: Secondary | ICD-10-CM | POA: Diagnosis not present

## 2023-06-16 DIAGNOSIS — K5790 Diverticulosis of intestine, part unspecified, without perforation or abscess without bleeding: Secondary | ICD-10-CM | POA: Diagnosis not present

## 2023-06-16 DIAGNOSIS — N179 Acute kidney failure, unspecified: Secondary | ICD-10-CM | POA: Diagnosis not present

## 2023-06-16 DIAGNOSIS — K852 Alcohol induced acute pancreatitis without necrosis or infection: Secondary | ICD-10-CM | POA: Diagnosis not present

## 2023-06-20 DIAGNOSIS — H401132 Primary open-angle glaucoma, bilateral, moderate stage: Secondary | ICD-10-CM | POA: Diagnosis not present

## 2023-06-21 ENCOUNTER — Other Ambulatory Visit: Payer: Self-pay | Admitting: Internal Medicine

## 2023-06-21 DIAGNOSIS — R109 Unspecified abdominal pain: Secondary | ICD-10-CM

## 2023-06-21 DIAGNOSIS — R7301 Impaired fasting glucose: Secondary | ICD-10-CM | POA: Diagnosis not present

## 2023-06-21 DIAGNOSIS — E785 Hyperlipidemia, unspecified: Secondary | ICD-10-CM | POA: Diagnosis not present

## 2023-06-21 DIAGNOSIS — Z125 Encounter for screening for malignant neoplasm of prostate: Secondary | ICD-10-CM | POA: Diagnosis not present

## 2023-06-21 DIAGNOSIS — Z1212 Encounter for screening for malignant neoplasm of rectum: Secondary | ICD-10-CM | POA: Diagnosis not present

## 2023-06-21 DIAGNOSIS — I129 Hypertensive chronic kidney disease with stage 1 through stage 4 chronic kidney disease, or unspecified chronic kidney disease: Secondary | ICD-10-CM | POA: Diagnosis not present

## 2023-06-21 DIAGNOSIS — N1832 Chronic kidney disease, stage 3b: Secondary | ICD-10-CM | POA: Diagnosis not present

## 2023-06-21 DIAGNOSIS — K852 Alcohol induced acute pancreatitis without necrosis or infection: Secondary | ICD-10-CM

## 2023-06-21 DIAGNOSIS — E291 Testicular hypofunction: Secondary | ICD-10-CM | POA: Diagnosis not present

## 2023-06-21 DIAGNOSIS — M109 Gout, unspecified: Secondary | ICD-10-CM | POA: Diagnosis not present

## 2023-06-22 ENCOUNTER — Ambulatory Visit
Admission: RE | Admit: 2023-06-22 | Discharge: 2023-06-22 | Disposition: A | Discharge status: Home / Self Care | Payer: Medicare HMO | Source: Ambulatory Visit | Attending: Internal Medicine | Admitting: Internal Medicine

## 2023-06-22 DIAGNOSIS — H353132 Nonexudative age-related macular degeneration, bilateral, intermediate dry stage: Secondary | ICD-10-CM | POA: Diagnosis not present

## 2023-06-22 DIAGNOSIS — R109 Unspecified abdominal pain: Secondary | ICD-10-CM

## 2023-06-22 DIAGNOSIS — E291 Testicular hypofunction: Secondary | ICD-10-CM | POA: Diagnosis not present

## 2023-06-22 DIAGNOSIS — H401131 Primary open-angle glaucoma, bilateral, mild stage: Secondary | ICD-10-CM | POA: Diagnosis not present

## 2023-06-22 DIAGNOSIS — H52202 Unspecified astigmatism, left eye: Secondary | ICD-10-CM | POA: Diagnosis not present

## 2023-06-22 DIAGNOSIS — M545 Low back pain, unspecified: Secondary | ICD-10-CM | POA: Diagnosis not present

## 2023-06-22 DIAGNOSIS — K852 Alcohol induced acute pancreatitis without necrosis or infection: Secondary | ICD-10-CM

## 2023-06-22 DIAGNOSIS — I700 Atherosclerosis of aorta: Secondary | ICD-10-CM | POA: Diagnosis not present

## 2023-06-22 DIAGNOSIS — Z9049 Acquired absence of other specified parts of digestive tract: Secondary | ICD-10-CM | POA: Diagnosis not present

## 2023-06-22 DIAGNOSIS — M47816 Spondylosis without myelopathy or radiculopathy, lumbar region: Secondary | ICD-10-CM | POA: Diagnosis not present

## 2023-06-22 DIAGNOSIS — Z961 Presence of intraocular lens: Secondary | ICD-10-CM | POA: Diagnosis not present

## 2023-06-22 MED ORDER — IOPAMIDOL (ISOVUE-300) INJECTION 61%
100.0000 mL | Freq: Once | INTRAVENOUS | Status: AC | PRN
  Administered 2023-06-22: 100 mL via INTRAVENOUS

## 2023-06-23 ENCOUNTER — Encounter: Payer: Self-pay | Admitting: Cardiology

## 2023-06-27 ENCOUNTER — Other Ambulatory Visit: Payer: Self-pay | Admitting: Cardiology

## 2023-06-27 DIAGNOSIS — M545 Low back pain, unspecified: Secondary | ICD-10-CM | POA: Diagnosis not present

## 2023-06-28 DIAGNOSIS — E291 Testicular hypofunction: Secondary | ICD-10-CM | POA: Diagnosis not present

## 2023-06-28 DIAGNOSIS — R82998 Other abnormal findings in urine: Secondary | ICD-10-CM | POA: Diagnosis not present

## 2023-06-28 DIAGNOSIS — Z1339 Encounter for screening examination for other mental health and behavioral disorders: Secondary | ICD-10-CM | POA: Diagnosis not present

## 2023-06-28 DIAGNOSIS — Z1212 Encounter for screening for malignant neoplasm of rectum: Secondary | ICD-10-CM | POA: Diagnosis not present

## 2023-06-28 DIAGNOSIS — Z1331 Encounter for screening for depression: Secondary | ICD-10-CM | POA: Diagnosis not present

## 2023-07-11 DIAGNOSIS — M545 Low back pain, unspecified: Secondary | ICD-10-CM | POA: Diagnosis not present

## 2023-07-12 ENCOUNTER — Encounter: Payer: Self-pay | Admitting: Neurology

## 2023-07-14 DIAGNOSIS — C672 Malignant neoplasm of lateral wall of bladder: Secondary | ICD-10-CM | POA: Diagnosis not present

## 2023-07-18 DIAGNOSIS — R300 Dysuria: Secondary | ICD-10-CM | POA: Diagnosis not present

## 2023-07-20 DIAGNOSIS — E291 Testicular hypofunction: Secondary | ICD-10-CM | POA: Diagnosis not present

## 2023-07-22 ENCOUNTER — Encounter: Payer: Self-pay | Admitting: Cardiology

## 2023-07-27 DIAGNOSIS — E291 Testicular hypofunction: Secondary | ICD-10-CM | POA: Diagnosis not present

## 2023-08-02 DIAGNOSIS — M47817 Spondylosis without myelopathy or radiculopathy, lumbosacral region: Secondary | ICD-10-CM | POA: Diagnosis not present

## 2023-08-02 DIAGNOSIS — M47816 Spondylosis without myelopathy or radiculopathy, lumbar region: Secondary | ICD-10-CM | POA: Diagnosis not present

## 2023-08-02 DIAGNOSIS — M5146 Schmorl's nodes, lumbar region: Secondary | ICD-10-CM | POA: Diagnosis not present

## 2023-08-03 DIAGNOSIS — E291 Testicular hypofunction: Secondary | ICD-10-CM | POA: Diagnosis not present

## 2023-08-08 ENCOUNTER — Encounter: Payer: Medicare HMO | Attending: Psychology | Admitting: Psychology

## 2023-08-08 DIAGNOSIS — R413 Other amnesia: Secondary | ICD-10-CM | POA: Diagnosis not present

## 2023-08-08 DIAGNOSIS — F09 Unspecified mental disorder due to known physiological condition: Secondary | ICD-10-CM | POA: Diagnosis not present

## 2023-08-09 ENCOUNTER — Encounter: Payer: Self-pay | Admitting: Psychology

## 2023-08-09 NOTE — Progress Notes (Signed)
08/08/2023 3 PM-4 PM:  Today was a follow-up visit with the patient.  I had initially seen him back in 2022 for the initial neuropsychological evaluation, which can be found in the patient's EMR.  The patient showed symptoms consistent with subjective reports of changes in memory function from premorbid status with the inability to rule out an Alzheimer's type process.  Patient's neuropsychological evaluation was not consistent with Lewy body dementia or other types of progressive degenerative conditions.  However, this was not definitive in nature during our first evaluation and repeat evaluation was planned.  The patient returned in 2023 and at that time reported little to no change over the prior year and the patient continued with steady MoCA testing during neurological evaluations.  We decided at that point to hold off.  The patient returns today reporting that he has remained fairly stable as far as cognitive functioning but has noted some mild changes over the past 2 years.  The patient reports that he has, however, continued to have significant pain difficulties with compression of his spine and had an acute onset of uncontrolled sweating that was challenging to figure out causative factors.  Eventually, they reduced the amount of testosterone he was being given and the sweating appears to have stopped.  Patient also had an episode of very painful pancreatitis that has gotten better.  The patient denies significant changes in visual spatial functioning it and he has continued to be active in his life.  He notes some mild changes in memory over the past 2 years with continued differences from baseline.  We have set the patient up for follow-up neuropsychological evaluation and we will repeat the test battery that was administered in 2022 for direct comparison purposes.  Tests To Be Administered: Edrick Oh Executive Functioning System (D-KEFS), Verbal Fluency Test (Standard) Trail Making Test (TMT;  Part A & B) Wechsler Adult Intelligence Scale, 4th Edition (WAIS-IV) Wechsler Memory Scale, 4th Edition (WMS-IV); Older Adult Battery

## 2023-08-10 DIAGNOSIS — E291 Testicular hypofunction: Secondary | ICD-10-CM | POA: Diagnosis not present

## 2023-08-15 DIAGNOSIS — Z6832 Body mass index (BMI) 32.0-32.9, adult: Secondary | ICD-10-CM | POA: Diagnosis not present

## 2023-08-15 DIAGNOSIS — M47816 Spondylosis without myelopathy or radiculopathy, lumbar region: Secondary | ICD-10-CM | POA: Diagnosis not present

## 2023-08-17 DIAGNOSIS — E291 Testicular hypofunction: Secondary | ICD-10-CM | POA: Diagnosis not present

## 2023-08-23 ENCOUNTER — Encounter: Payer: Medicare HMO | Attending: Psychology

## 2023-08-23 DIAGNOSIS — F09 Unspecified mental disorder due to known physiological condition: Secondary | ICD-10-CM | POA: Diagnosis not present

## 2023-08-23 DIAGNOSIS — R413 Other amnesia: Secondary | ICD-10-CM | POA: Diagnosis not present

## 2023-08-23 NOTE — Progress Notes (Unsigned)
Behavioral Observations:  The patient appeared well-groomed and appropriately dressed. His manners were polite and appropriate to the situation. The patient's attitude towards testing was positive and his effort was good. The patient became frustrated during the [facility] subtest of the WMS-IV. The patient was also frustrated by the [employer], scribbling on the page upon attempting part B.  Neuropsychology Note  Brian Cortez completed 110 minutes of neuropsychological testing with technician, Staci Acosta, BA, under the supervision of Arley Phenix, PsyD., Clinical Neuropsychologist. The patient did not appear overtly distressed by the testing session, per behavioral observation or via self-report to the technician. Rest breaks were offered.   Clinical Decision Making: In considering the patient's current level of functioning, level of presumed impairment, nature of symptoms, emotional and behavioral responses during clinical interview, level of literacy, and observed level of motivation/effort, a battery of tests was selected by Dr. Kieth Brightly during initial consultation on 08/08/2023. This was communicated to the technician. Communication between the neuropsychologist and technician was ongoing throughout the testing session and changes were made as deemed necessary based on patient performance on testing, technician observations and additional pertinent factors such as those listed above.  Tests Administered: Edrick Oh Executive Functioning System (D-KEFS), Verbal Fluency  Trail Making Test (TMT; Part A & B) Wechsler Adult Intelligence Scale, 4th Edition (WAIS-IV) Wechsler Memory Scale, 4th Edition (WMS-IV);Older Adult Battery    Results:  D-KEFS Verbal Fluency:  Verbal Fluency Test (Standard) Letter Fluency total correct scaled score  8          Verbal Fluency Test (Standard) Category Fluency total correct scaled score 9           Verbal Fluency Test  (Standard) Category Switching total correct scaled score 11             Verbal Fluency Test (Standard) Switching accuracy total correct scaled score 12  Trail Making Test:  Part A Time= 35.42 seconds 1 error  Part B  Time= 2:45:91 Did not complete, became frustrated and scribbled on page    WAIS-IV:  Composite Score Summary  Scale Sum of Scaled Scores Composite Score Percentile Rank 95% Conf. Interval Qualitative Description  Verbal Comprehension 30 VCI 100 50 94-106 Average  Perceptual Reasoning 27 PRI 94 34 88-101 Average  Working Memory 17 WMI 92 30 86-99 Average  Processing Speed 24 PSI 111 77 102-118 High Average  Full Scale 98 FSIQ 98 45 94-102 Average  General Ability 57 GAI 97 42 92-102 Average   Verbal Comprehension Subtests Summary  Subtest Raw Score Scaled Score Percentile Rank Reference Group Scaled Score SEM  Similarities 23 10 50 9 0.95  Vocabulary 35 10 50 10 0.67  Information 15 10 50 11 0.73  The scaled scores in the Reference Group Scaled Score column are based on the performance of examinees aged 20:0-34:11 (i.e., the reference group). See Chapter 6 of the WAIS-IV Technical and Interpretive Manual for more information.  Perceptual Reasoning Subtests Summary  Subtest Raw Score Scaled Score Percentile Rank Reference Group Scaled Score SEM  Block Design 34 11 63 8 0.99  Matrix Reasoning 7 7 16 3  0.90  Visual Puzzles 10 9 37 7 0.99   Working Librarian, academic Raw Score Scaled Score Percentile Rank Reference Group Scaled Score SEM  Digit Span 19 7 16 5  0.73  Arithmetic 13 10 50 9 1.20   Processing Speed Subtests Summary  Subtest Raw Score Scaled Score Percentile Rank Reference Group  Scaled Score SEM  Symbol Search 20 9 37 5 1.12  Coding 74 15 95 10 1.12    WMS-IV:  Index Score Summary  Index Sum of Scaled Scores Index Score Percentile Rank 95% Confidence Interval Qualitative Descriptor  Auditory Memory (AMI) 41 101 53 95-107  Average  Visual Memory (VMI) 18 95 37 90-100 Average  Immediate Memory (IMI) 30 100 50 94-106 Average  Delayed Memory (DMI) 29 98 45 91-106 Average     Primary Subtest Scaled Score Summary  Subtest Domain Raw Score Scaled Score Percentile Rank  Logical Memory I AM 33 11 63  Logical Memory II AM 24 12 75  [facility] I AM 15 8 25   [facility] II AM 6 10 50  Visual Reproduction I VM 33 11 63  Visual Reproduction II VM 10 7 16   Symbol Span VWM 17 10 50   Auditory Memory Process Score Summary  Process Score Raw Score Scaled Score Percentile Rank Cumulative Percentage (Base Rate)  LM II Recognition 20 - - >75%  VPA II Recognition 24 - - 10-16%   Visual Memory Process Score Summary  Process Score Raw Score Scaled Score Percentile Rank Cumulative Percentage (Base Rate)  VR II Recognition 6 - - >75%    ABILITY-MEMORY ANALYSIS  Ability Score:  VCI: 100 Date of Testing:  WAIS-IV; WMS-IV 2023/08/23  Predicted Difference Method   Index Predicted WMS-IV Index Score Actual WMS-IV Index Score Difference Critical Value  Significant Difference Y/N Base Rate  Auditory Memory 100 101 -1 10.41 N   Visual Memory 100 95 5 7.35 N   Immediate Memory 100 100 0 9.69 N   Delayed Memory 100 98 2 12.22 N   Statistical significance (critical value) at the .01 level.    Feedback to Patient: Brian Cortez will return on 05/17/2024 for an interactive feedback session with Dr. Kieth Brightly at which time his test performances, clinical impressions and treatment recommendations will be reviewed in detail. The patient understands he can contact our office should he require our assistance before this time.  110 minutes spent face-to-face with patient administering standardized tests, 30 minutes spent scoring Radiographer, therapeutic). [CPT [id number], [id number]]  Full report to follow.

## 2023-08-24 ENCOUNTER — Other Ambulatory Visit (HOSPITAL_BASED_OUTPATIENT_CLINIC_OR_DEPARTMENT_OTHER): Payer: Self-pay

## 2023-08-24 DIAGNOSIS — E291 Testicular hypofunction: Secondary | ICD-10-CM | POA: Diagnosis not present

## 2023-08-24 MED ORDER — COMIRNATY 30 MCG/0.3ML IM SUSY
0.3000 mL | PREFILLED_SYRINGE | Freq: Once | INTRAMUSCULAR | 0 refills | Status: AC
  Filled 2023-08-24: qty 0.3, 1d supply, fill #0

## 2023-08-27 DIAGNOSIS — Z23 Encounter for immunization: Secondary | ICD-10-CM | POA: Diagnosis not present

## 2023-08-29 DIAGNOSIS — M47816 Spondylosis without myelopathy or radiculopathy, lumbar region: Secondary | ICD-10-CM | POA: Diagnosis not present

## 2023-08-31 DIAGNOSIS — E291 Testicular hypofunction: Secondary | ICD-10-CM | POA: Diagnosis not present

## 2023-09-07 DIAGNOSIS — E291 Testicular hypofunction: Secondary | ICD-10-CM | POA: Diagnosis not present

## 2023-09-13 DIAGNOSIS — M47816 Spondylosis without myelopathy or radiculopathy, lumbar region: Secondary | ICD-10-CM | POA: Diagnosis not present

## 2023-09-13 DIAGNOSIS — M5416 Radiculopathy, lumbar region: Secondary | ICD-10-CM | POA: Diagnosis not present

## 2023-09-14 DIAGNOSIS — E291 Testicular hypofunction: Secondary | ICD-10-CM | POA: Diagnosis not present

## 2023-09-15 DIAGNOSIS — M545 Low back pain, unspecified: Secondary | ICD-10-CM | POA: Diagnosis not present

## 2023-09-16 ENCOUNTER — Other Ambulatory Visit: Payer: Self-pay | Admitting: Internal Medicine

## 2023-09-21 DIAGNOSIS — E291 Testicular hypofunction: Secondary | ICD-10-CM | POA: Diagnosis not present

## 2023-09-27 ENCOUNTER — Encounter: Payer: Self-pay | Admitting: Neurology

## 2023-09-28 DIAGNOSIS — E291 Testicular hypofunction: Secondary | ICD-10-CM | POA: Diagnosis not present

## 2023-10-04 DIAGNOSIS — M47816 Spondylosis without myelopathy or radiculopathy, lumbar region: Secondary | ICD-10-CM | POA: Diagnosis not present

## 2023-10-06 DIAGNOSIS — C672 Malignant neoplasm of lateral wall of bladder: Secondary | ICD-10-CM | POA: Diagnosis not present

## 2023-10-06 DIAGNOSIS — E291 Testicular hypofunction: Secondary | ICD-10-CM | POA: Diagnosis not present

## 2023-10-08 IMAGING — CT CT ANGIO CHEST
2 of 6 series · 16 of 46 positions shown · IV contrast (agent unspecified)
Comparison: 10/21/2021

CLINICAL DATA: Chest and abdominal pain



[Series 5: thins · axial · 0.76mm/px · z∈[+177,+459]mm · 13 of 310 slices shown]
[im 14/310  lung]
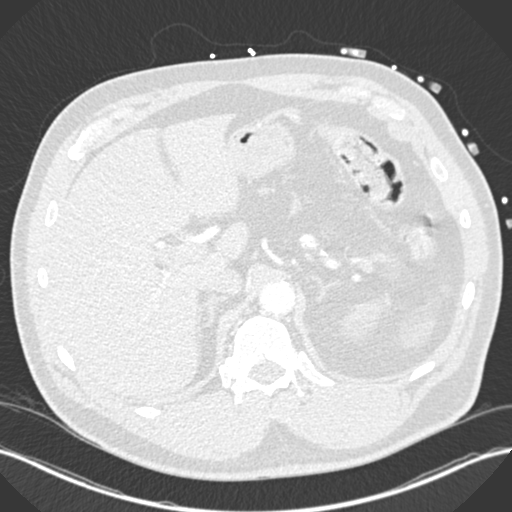
[im 41/310  soft-tissue]
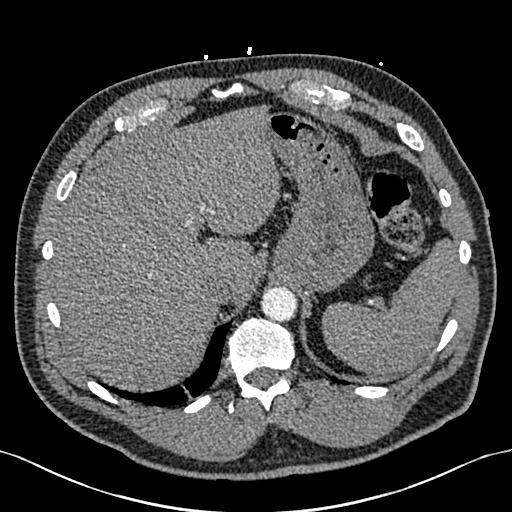
[im 68/310  lung]
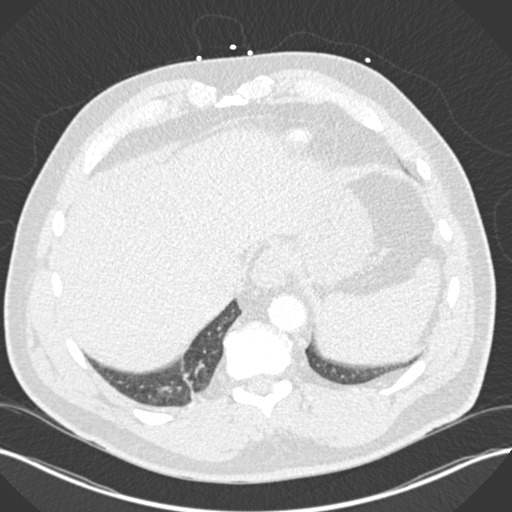
[im 81/310  soft-tissue]
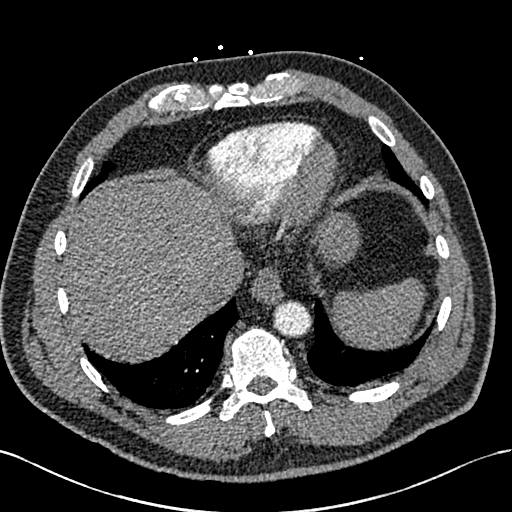
[im 108/310  lung]
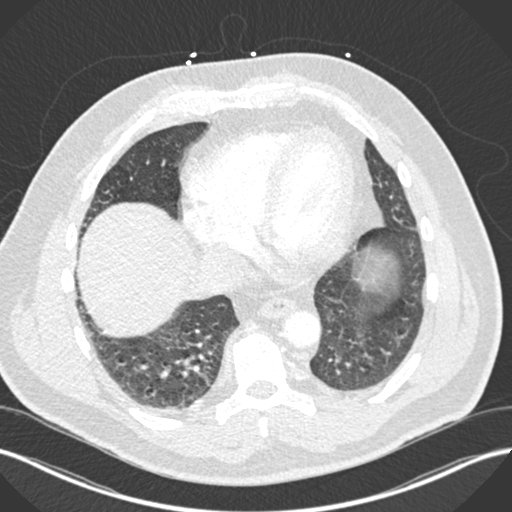
[im 135/310  soft-tissue]
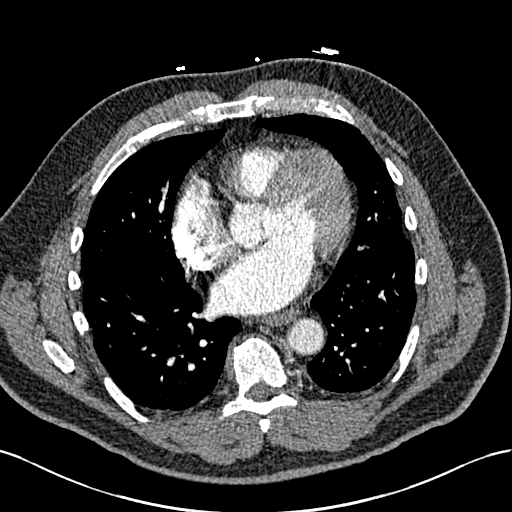
[im 162/310  lung]
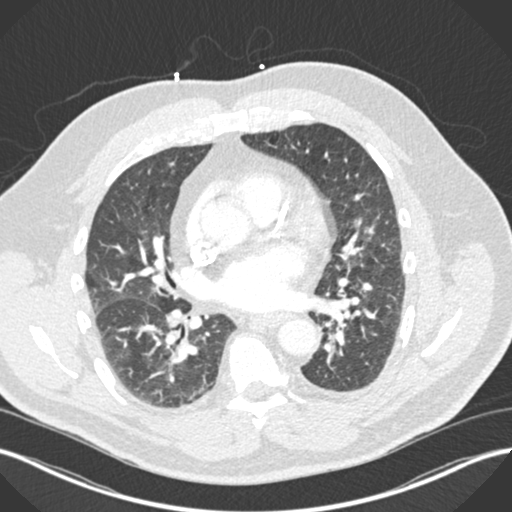
[im 175/310  soft-tissue]
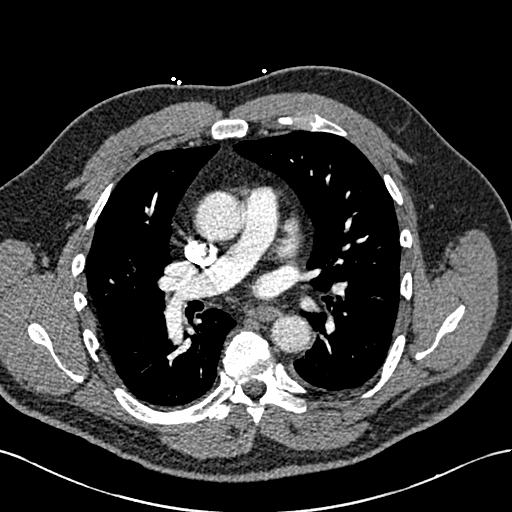
[im 202/310  lung]
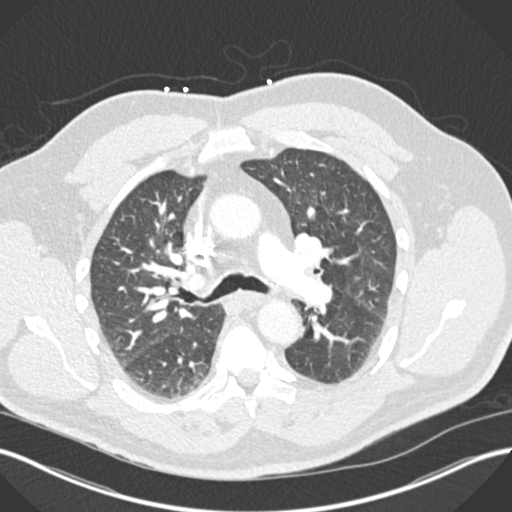
[im 229/310  soft-tissue]
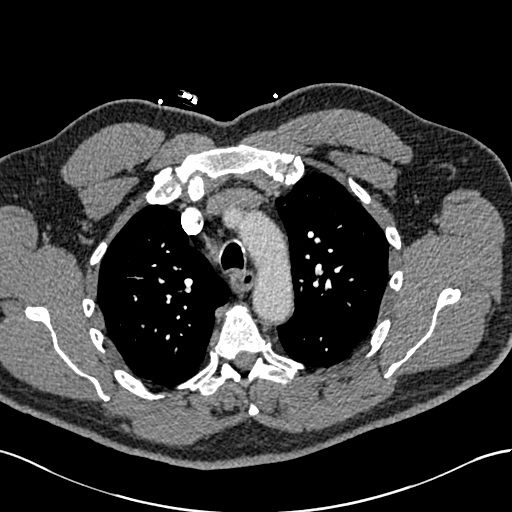
[im 242/310  lung]
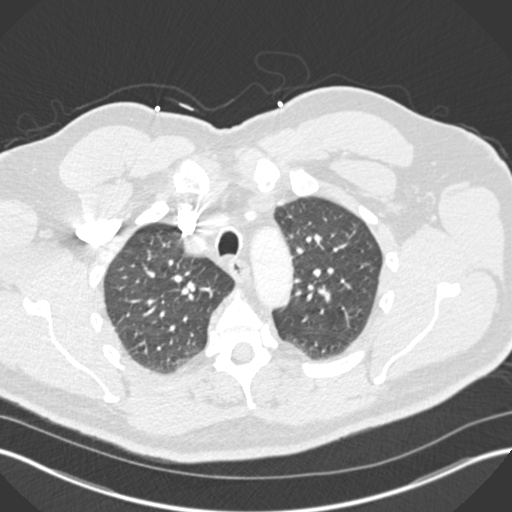
[im 269/310  soft-tissue]
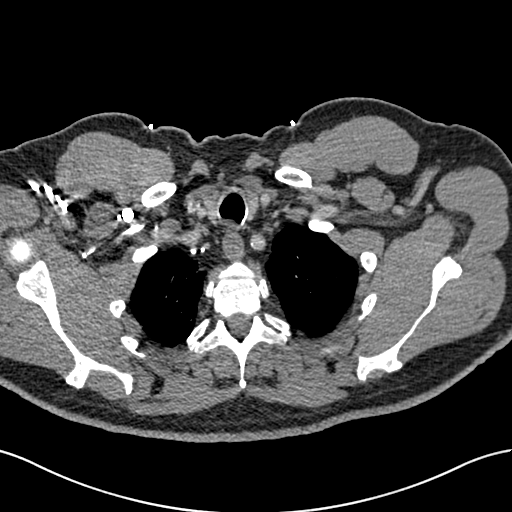
[im 296/310  lung]
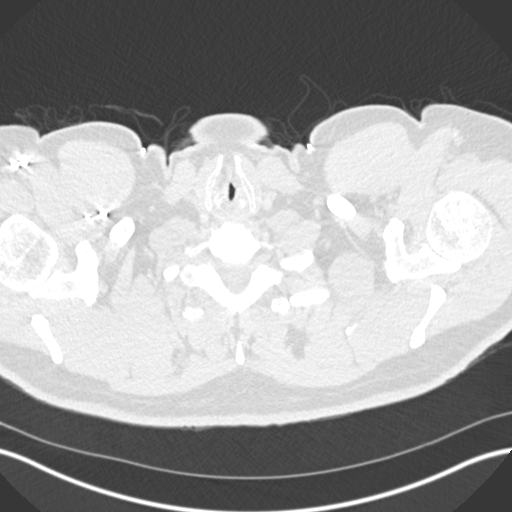

[Series 7: coronal mpr · coronal · 0.64mm/px · 3 of 166 slices shown]
[im 42/166  soft-tissue]
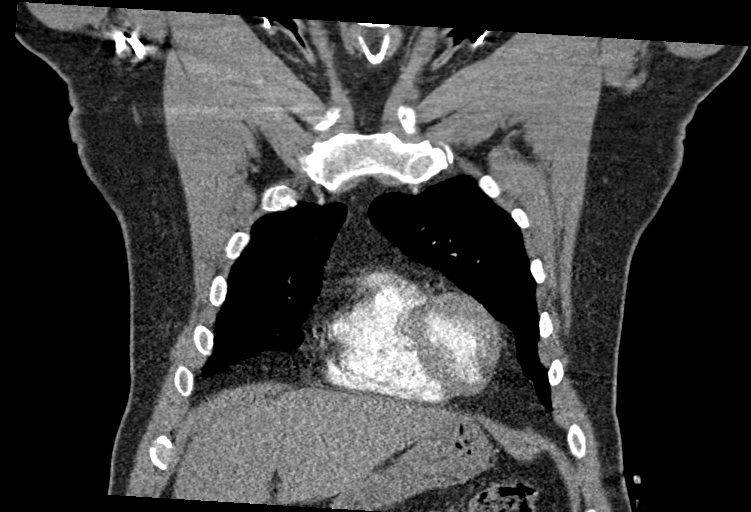
[im 83/166  soft-tissue]
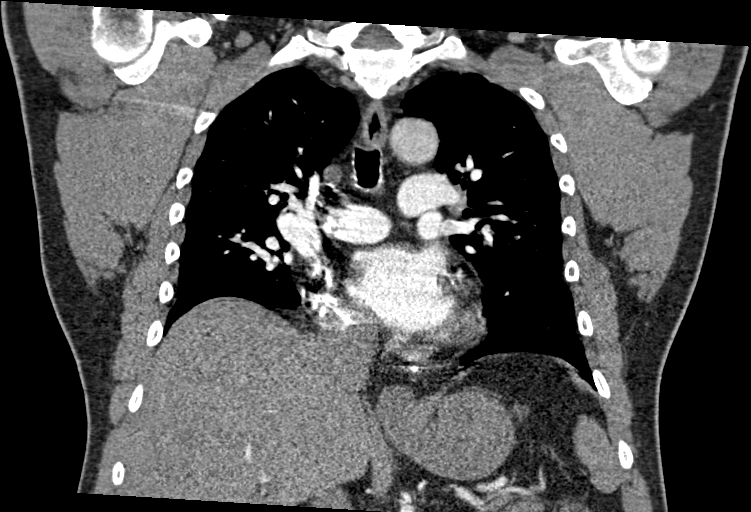
[im 124/166  soft-tissue]
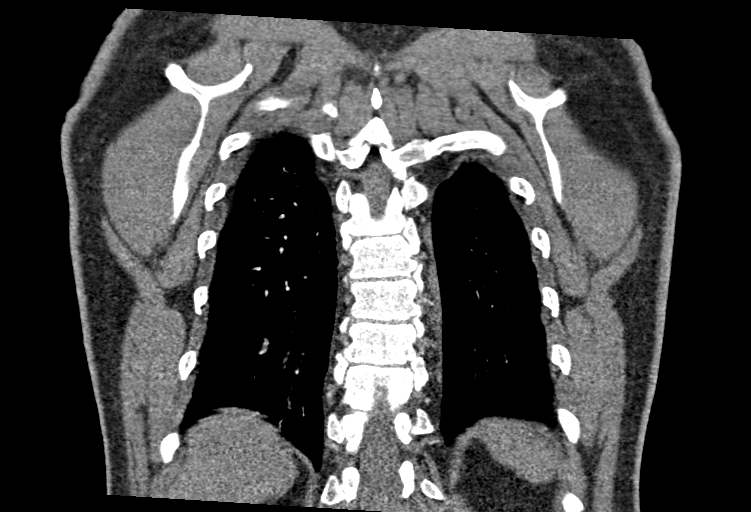

[16 of 46 positions shown; findings below may reference images not displayed]

RADIATION DOSE REDUCTION: This exam was performed according to the
departmental dose-optimization program which includes automated
exposure control, adjustment of the mA and/or kV according to
patient size and/or use of iterative reconstruction technique.

CONTRAST:  100mL OMNIPAQUE IOHEXOL 350 MG/ML SOLN
FINDINGS: CTA CHEST FINDINGS

Cardiovascular: Thoracic aorta shows no aneurysmal dilatation or
dissection. No cardiac enlargement is seen. Scattered coronary
calcifications are noted. The pulmonary artery shows a normal
branching pattern without evidence of pulmonary embolism.

Mediastinum/Nodes: Thoracic inlet is within normal limits. Few small
hilar lymph nodes are identified but not significant by size
criteria. The esophagus as visualized is within normal limits.

Lungs/Pleura: Lungs are well aerated bilaterally. Minimal
atelectatic changes are noted in the right lower lobe. No focal
infiltrate or sizable effusion is seen. No parenchymal nodules are
seen.

Musculoskeletal: Degenerative changes of the thoracic spine are
noted. No acute rib abnormality is seen.

Review of the MIP images confirms the above findings.

CT ABDOMEN and PELVIS FINDINGS

Hepatobiliary: No focal liver abnormality is seen. Status post
cholecystectomy. No biliary dilatation.

Pancreas: Unremarkable. No pancreatic ductal dilatation or
surrounding inflammatory changes.

Spleen: Normal in size without focal abnormality.

Adrenals/Urinary Tract: Adrenal glands are within normal limits.
Kidneys show a normal enhancement pattern without evidence of renal
calculi or obstructive changes. The bladder is well distended. Mild
perinephric stranding is seen with stable from the prior exam.

Stomach/Bowel: The appendix is not well visualized although no
inflammatory changes are seen. Colon shows no obstructive or
inflammatory changes. The small bowel and stomach are within normal
limits.

Vascular/Lymphatic: Aortic atherosclerosis. No enlarged abdominal or
pelvic lymph nodes.

Reproductive: Prostate is unremarkable.

Other: No abdominal wall hernia or abnormality. No abdominopelvic
ascites.

Musculoskeletal: Bony structures show degenerative change of the
lumbar spine. No compression deformity is seen. In the soft tissues
of the proximal right thigh, however there are foci of air
interposed between the musculature as well as some subcutaneous
edema. This is likely related to the patient's known recent right
knee replacement.

Review of the MIP images confirms the above findings.
IMPRESSION: CTA of the chest: No evidence of pulmonary emboli.

No acute abnormality is seen.

CT of the abdomen and pelvis: No acute abnormality is noted within
the abdomen and pelvis.

There are soft tissue changes in the proximal right thigh consistent
with the known recent right knee arthroplasty.

## 2023-10-08 IMAGING — CR DG ABDOMEN ACUTE W/ 1V CHEST
3 series · 3 of 3 positions shown · non-contrast
Comparison: 03/19/2022

CLINICAL DATA: Hiccups, recent surgery, hiatal hernia

EXAM:
DG ABDOMEN ACUTE WITH 1 VIEW CHEST

[w chest pa]
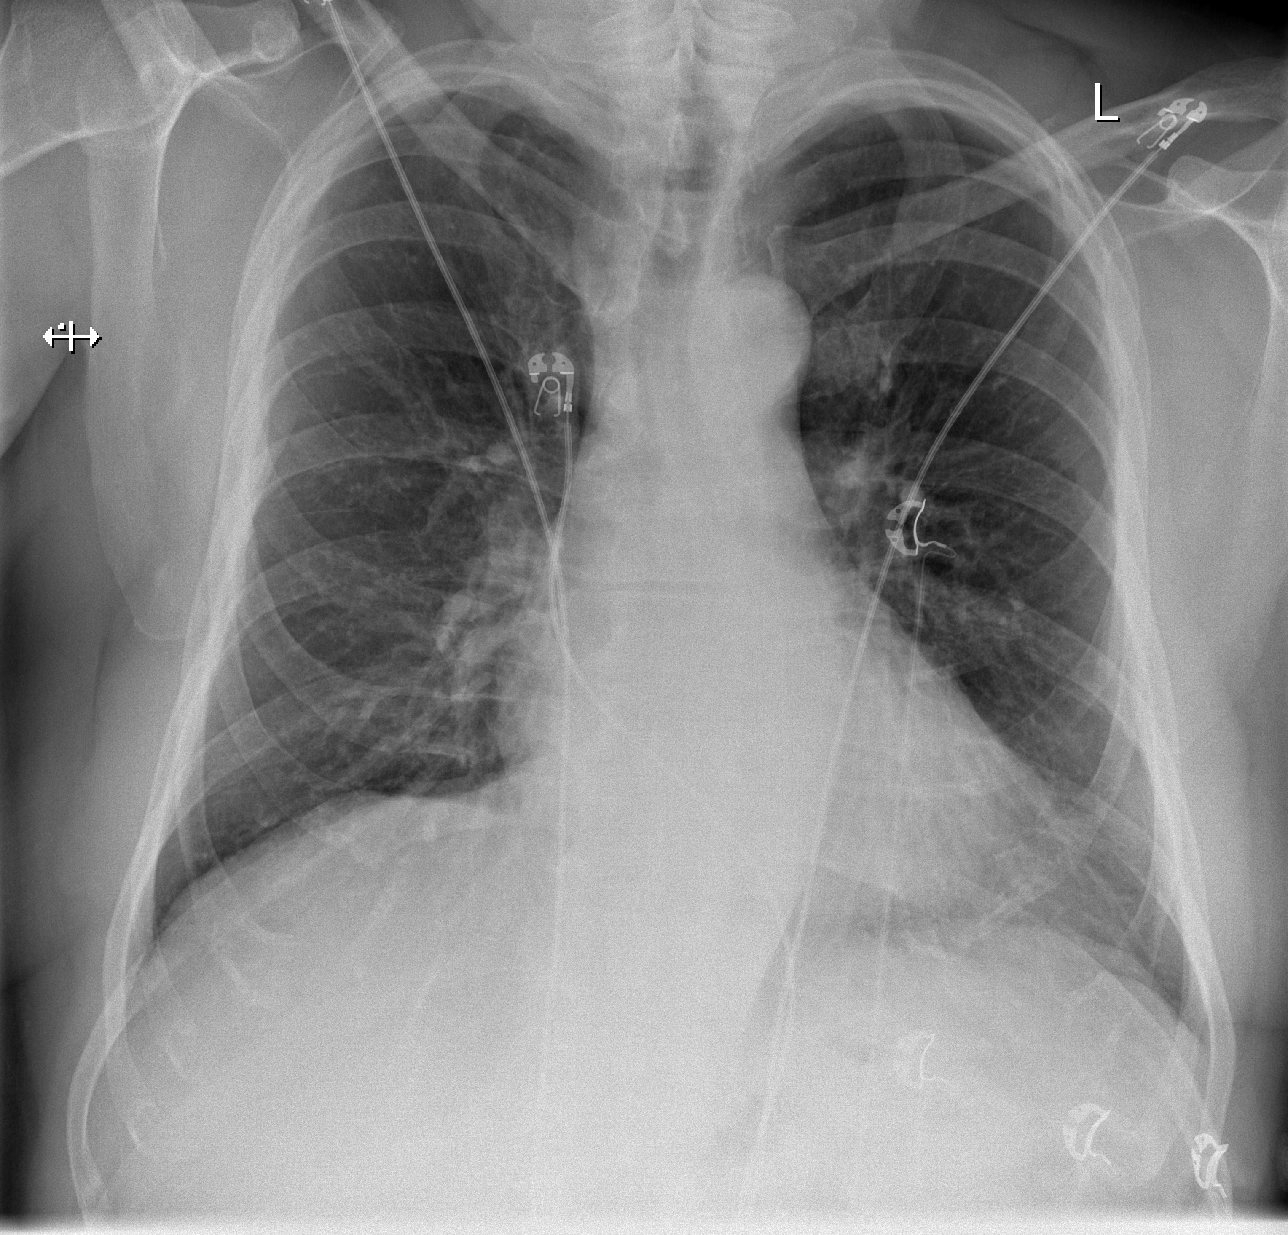

[w abdomen upright]
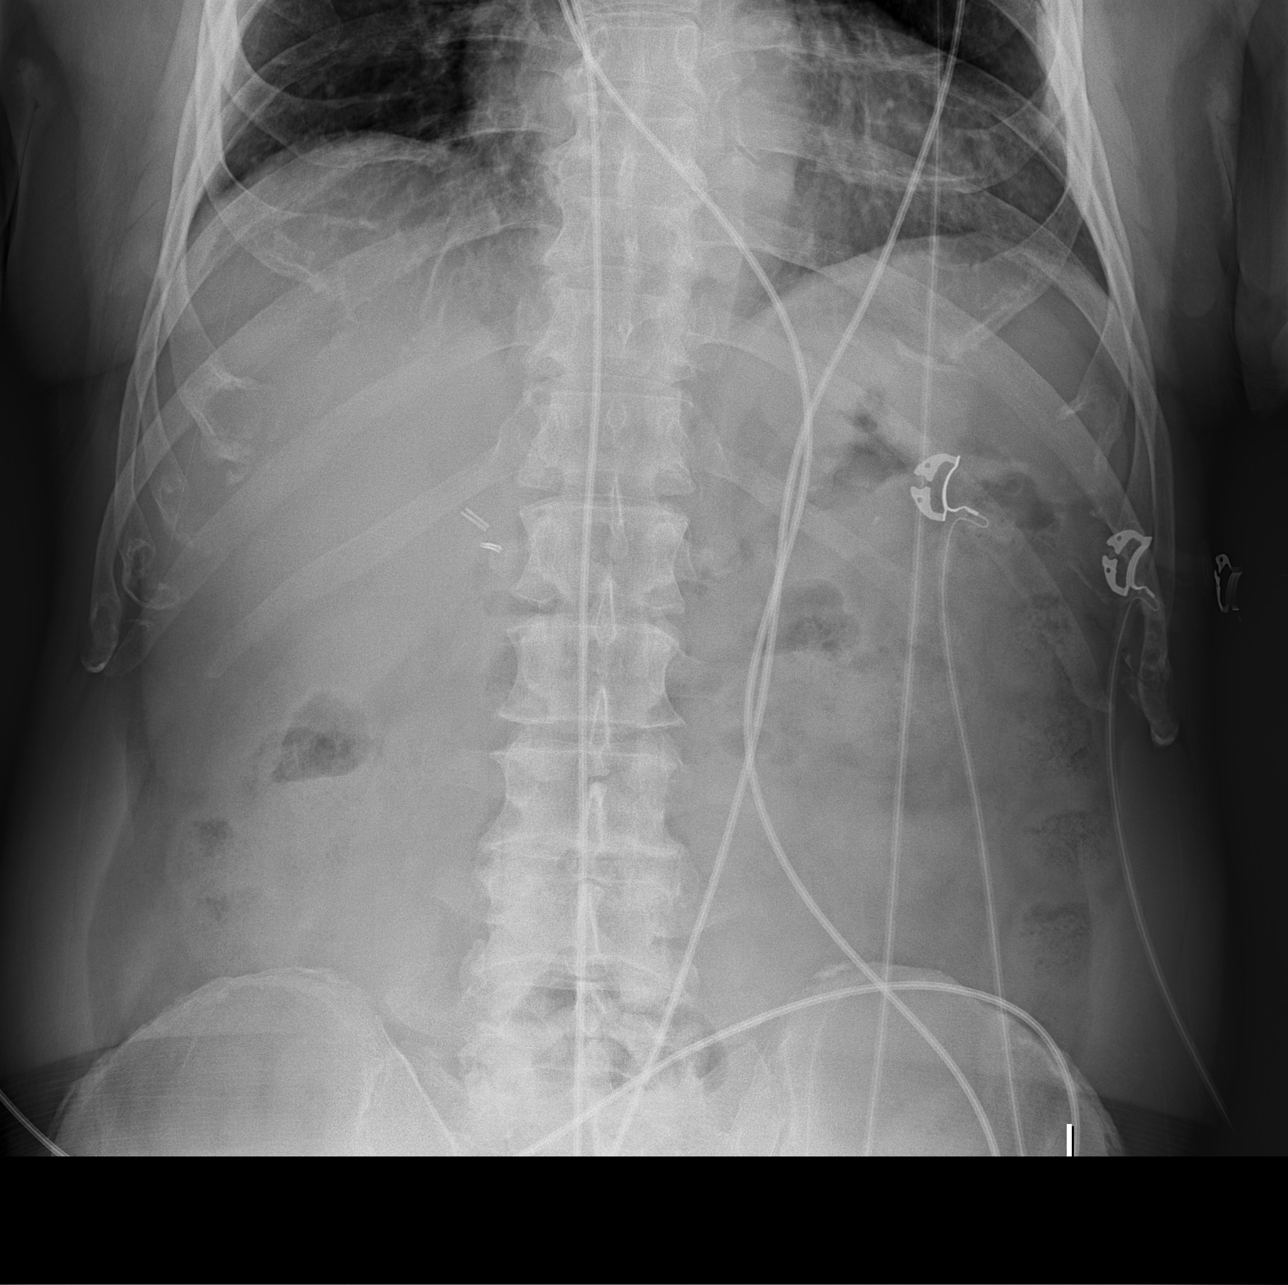

[t abdomen supine]
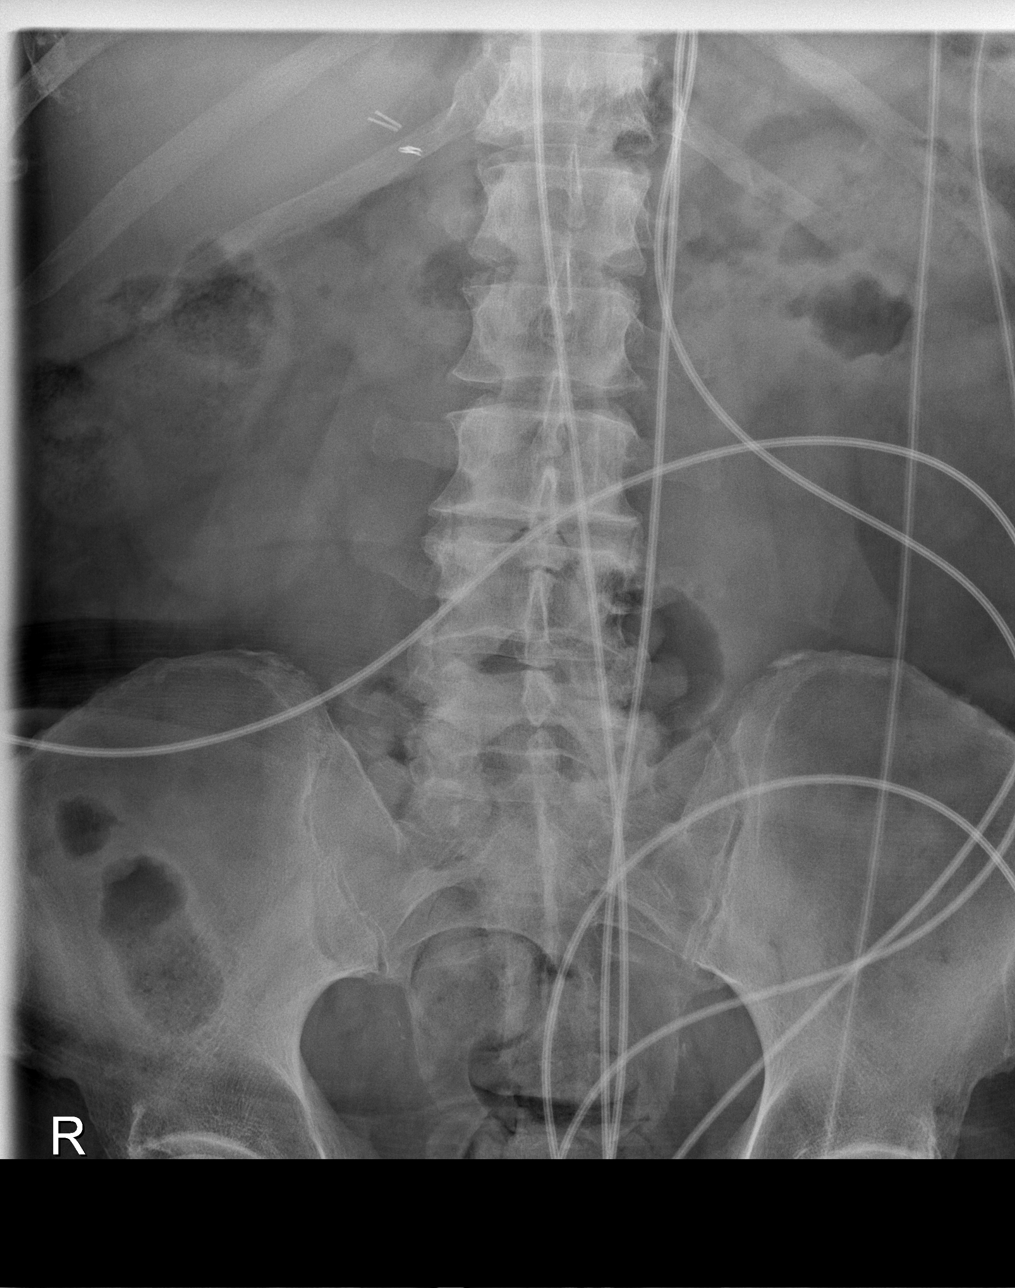

[3 of 3 positions shown; findings below may reference images not displayed]

FINDINGS: Lungs are clear.  No pleural effusion or pneumothorax.

The heart is normal in size.

Nonobstructive bowel gas pattern.

No evidence of free air under the diaphragm on the upright view.

Cholecystectomy clips.
IMPRESSION: Negative abdominal radiographs.  No acute cardiopulmonary disease.

## 2023-10-12 DIAGNOSIS — E291 Testicular hypofunction: Secondary | ICD-10-CM | POA: Diagnosis not present

## 2023-10-12 DIAGNOSIS — N1832 Chronic kidney disease, stage 3b: Secondary | ICD-10-CM | POA: Diagnosis not present

## 2023-10-12 DIAGNOSIS — E785 Hyperlipidemia, unspecified: Secondary | ICD-10-CM | POA: Diagnosis not present

## 2023-10-12 DIAGNOSIS — I129 Hypertensive chronic kidney disease with stage 1 through stage 4 chronic kidney disease, or unspecified chronic kidney disease: Secondary | ICD-10-CM | POA: Diagnosis not present

## 2023-10-25 DIAGNOSIS — F1010 Alcohol abuse, uncomplicated: Secondary | ICD-10-CM | POA: Diagnosis not present

## 2023-10-25 DIAGNOSIS — K5790 Diverticulosis of intestine, part unspecified, without perforation or abscess without bleeding: Secondary | ICD-10-CM | POA: Diagnosis not present

## 2023-10-25 DIAGNOSIS — I129 Hypertensive chronic kidney disease with stage 1 through stage 4 chronic kidney disease, or unspecified chronic kidney disease: Secondary | ICD-10-CM | POA: Diagnosis not present

## 2023-10-25 DIAGNOSIS — E291 Testicular hypofunction: Secondary | ICD-10-CM | POA: Diagnosis not present

## 2023-10-25 DIAGNOSIS — C679 Malignant neoplasm of bladder, unspecified: Secondary | ICD-10-CM | POA: Diagnosis not present

## 2023-10-25 DIAGNOSIS — E118 Type 2 diabetes mellitus with unspecified complications: Secondary | ICD-10-CM | POA: Diagnosis not present

## 2023-10-25 DIAGNOSIS — I8013 Phlebitis and thrombophlebitis of femoral vein, bilateral: Secondary | ICD-10-CM | POA: Diagnosis not present

## 2023-10-25 DIAGNOSIS — Z96651 Presence of right artificial knee joint: Secondary | ICD-10-CM | POA: Diagnosis not present

## 2023-10-25 DIAGNOSIS — K852 Alcohol induced acute pancreatitis without necrosis or infection: Secondary | ICD-10-CM | POA: Diagnosis not present

## 2023-10-25 DIAGNOSIS — E669 Obesity, unspecified: Secondary | ICD-10-CM | POA: Diagnosis not present

## 2023-10-25 DIAGNOSIS — N1832 Chronic kidney disease, stage 3b: Secondary | ICD-10-CM | POA: Diagnosis not present

## 2023-10-25 DIAGNOSIS — F419 Anxiety disorder, unspecified: Secondary | ICD-10-CM | POA: Diagnosis not present

## 2023-10-26 DIAGNOSIS — E291 Testicular hypofunction: Secondary | ICD-10-CM | POA: Diagnosis not present

## 2023-11-02 DIAGNOSIS — E291 Testicular hypofunction: Secondary | ICD-10-CM | POA: Diagnosis not present

## 2023-11-08 DIAGNOSIS — M4316 Spondylolisthesis, lumbar region: Secondary | ICD-10-CM | POA: Diagnosis not present

## 2023-11-09 DIAGNOSIS — E291 Testicular hypofunction: Secondary | ICD-10-CM | POA: Diagnosis not present

## 2023-11-25 ENCOUNTER — Ambulatory Visit: Payer: Medicare HMO | Admitting: Internal Medicine

## 2023-11-25 ENCOUNTER — Encounter: Payer: Self-pay | Admitting: Internal Medicine

## 2023-11-25 VITALS — BP 120/74 | HR 63 | Ht 75.0 in | Wt 254.0 lb

## 2023-11-25 DIAGNOSIS — K219 Gastro-esophageal reflux disease without esophagitis: Secondary | ICD-10-CM

## 2023-11-25 DIAGNOSIS — R197 Diarrhea, unspecified: Secondary | ICD-10-CM | POA: Diagnosis not present

## 2023-11-25 DIAGNOSIS — Z860101 Personal history of adenomatous and serrated colon polyps: Secondary | ICD-10-CM

## 2023-11-25 DIAGNOSIS — K859 Acute pancreatitis without necrosis or infection, unspecified: Secondary | ICD-10-CM

## 2023-11-25 DIAGNOSIS — R935 Abnormal findings on diagnostic imaging of other abdominal regions, including retroperitoneum: Secondary | ICD-10-CM | POA: Diagnosis not present

## 2023-11-25 MED ORDER — METRONIDAZOLE 250 MG PO TABS: 250.0000 mg | ORAL_TABLET | Freq: Three times a day (TID) | ORAL | 4 refills | Status: DC

## 2023-11-25 NOTE — Patient Instructions (Signed)
 We have sent the following medications to your pharmacy for you to pick up at your convenience:  Metronidazole .  You will be contacted by [facility] in the next 2 days to arrange a MRI/MRCP  The number on your caller ID will be [phone], please answer when they call.  If you have not heard from them in 2 days please call [phone] to schedule.    You can take Imodium as needed

## 2023-11-25 NOTE — Progress Notes (Signed)
 HISTORY OF PRESENT ILLNESS:  Brian Cortez is a 73 y.o. male with multiple medical problems as listed below.  He presents today regarding problems with diarrhea and prior episode of pancreatitis.  I last saw the patient September 08, 2021 regarding increased frequency and loose stools secondary to Aricept  and fecal leakage.  See that dictation.  He also has a history of adenomatous colon polyps and GERD.  At the time of his last visit fiber supplementation, Anusol  suppositories, metronidazole , and low-dose Imodium recommended.  He is due for surveillance colonoscopy.  Patient tells me that he has loose stools on a daily basis.  Most noticeable after breakfast and lunch, but not dinner.  Rare episodes of incontinence.  Some urgency.  No longer taking fiber as he felt this exacerbated problems.  He did feel that metronidazole  may have been helpful.  Imodium seems to help, though he uses this quite sparingly.  No issues with constipation.  He presented to the hospital emergency room May 30, 2023 with a several day history of abdominal pain.  CT scan revealed inflammation around the tail the pancreas suggesting pancreatitis.  At that time his lipase was normal.  He states that he continued with discomfort for several weeks, possibly a month.  He has been followed closely by his PCP.  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  Does use, or has used, moderate amounts of alcohol  on a frequent basis.  Since that time he has dramatically cut back.  He is lost 20 pounds.  He was recently diagnosed as type II diabetic for which he is being started on Farxiga.  Does have problems with spinal stenosis and bladder cancer for which she is followed closely by urology.  No longer having abdominal pain.  No family history of pancreatic cancer.  He was not on any medications that are known to cause pancreatitis, at the time of his presentation.  His last colonoscopy April 2017 with hyperplastic polyps  only  REVIEW OF SYSTEMS:  All non-GI ROS negative unless otherwise stated in the HPI except for back pain, arthritis, anxiety, sinus and allergy troubles  Past Medical History:  Diagnosis Date   Acute pancreatitis    Anxiety    Bladder tumor    Carpal tunnel syndrome, left    intermittant   Chronic seasonal allergic rhinitis    Coronary artery disease    Coronary calcium  score is very high at 1582   DDD (degenerative disc disease), cervical    Diabetes mellitus type 2 in nonobese (HCC)    Dilated cardiomyopathy (HCC)    Diverticulosis of colon    Fecal incontinence    GERD (gastroesophageal reflux disease)    Grade II diastolic dysfunction 06/17/2017   per ECHO    Hemorrhoids    Hiatal hernia    History of adenomatous polyp of colon    2001;  2007;  2012   History of bladder cancer urologist-  dr chales   11-06-2013  s/p TURBT and BCG tx's   History of kidney stones    about 45 years ago   History of panic attacks    History of urethral stricture    Hyperlipidemia    Hypertension    Hypogonadism in male    LVH (left ventricular hypertrophy) 06/17/2017   Mild, noted on ECHO   Nonischemic cardiomyopathy [facility])    cardiologist-  dr rolan-- per last echo 01/ 2016 ef 50-55%  (up from 09/ 2014 ef 45-50%)  OSA on CPAP    since 2014   Pneumonia 11/2010   Left lower lobe   Pulmonary hypertension (HCC) 06/17/2017   Mild, per ECHO    Past Surgical History:  Procedure Laterality Date   CARDIOVASCULAR STRESS TEST  08-21-2013  dr rolan   normal nuclear study w/ no ischemia/  normal LV function and wall motion , ef 63%   CATARACT EXTRACTION W/ INTRAOCULAR LENS  IMPLANT, BILATERAL  2013   COLONOSCOPY  last one 03-03-2016   CYSTO/  DIRECT VISION INTERNAL URETEROTOMY  11/25/2006   LAPAROSCOPIC CHOLECYSTECTOMY  10/15/2006   MENISCUS REPAIR Right 12/2020   ORBITAL FRACTURE SURGERY  1986   left eye:  Jan 1986--- fixation of fractures, repair of optic nerve decompression ,  and complex closure   TONSILLECTOMY  child   TRANSTHORACIC ECHOCARDIOGRAM  12-16-2014   dr rolan   grade 1 diastolic dysfunction, ef 50-55% (improved from last study in 2014 , previous ef 45-50%) /  trivial MR and TR/  mild LAE/  inferior vena cava dilated, respirophasic diameter changes were blunted (<50%), consistant w/ elevated central venous pressure   TRANSURETHRAL RESECTION OF BLADDER TUMOR Bilateral 05/16/2018   Procedure: TRANSURETHRAL RESECTION OF BLADDER TUMOR (TURBT)/ RETROGRADE;  Surgeon: Nieves Cough, MD;  Location: [facility];  Service: Urology;  Laterality: Bilateral;   TRANSURETHRAL RESECTION OF BLADDER TUMOR WITH GYRUS (TURBT-GYRUS) N/A 11/06/2013   Procedure: TRANSURETHRAL RESECTION OF BLADDER TUMOR WITH GYRUS (TURBT-GYRUS);  Surgeon: Arlena LILLETTE Gal, MD;  Location: [facility];  Service: Urology;  Laterality: N/A;   TRANSURETHRAL RESECTION OF BLADDER TUMOR WITH MITOMYCIN -C N/A 12/09/2016   Procedure: CYSTOSCOPY TRANSURETHRAL RESECTION OF BLADDER TUMOR WITH MITOMYCIN -C;  Surgeon: Arlena Gal, MD;  Location: [facility] [place];  Service: Urology;  Laterality: N/A;   VARICOSE VEIN SURGERY  2002   VASECTOMY      Social History Brian Cortez  reports that he has never smoked. He has never used smokeless tobacco. He reports current alcohol  use. He reports that he does not use drugs.  family history includes Heart attack in his paternal grandfather and paternal uncle; Heart attack (age of onset: 43) in his father; Heart attack (age of onset: 69) in his maternal uncle; Prostate cancer in his paternal uncle; Stroke (age of onset: 54) in his father.  Allergies  Allergen Reactions   Bee Pollen     Other reaction(s): Cough sinus issues   Pollen Extract Cough    sinus issues   Short Ragweed Pollen Ext Itching and Shortness Of Breath   Oxycodone  Itching   Percocet [Oxycodone -Acetaminophen ] Hives   Pine Tar Itching and Other (See Comments)     Sinus congestion   Sulfamethoxazole Nausea And Vomiting   Sulfamethoxazole-Trimethoprim  Rash       PHYSICAL EXAMINATION: Vital signs: BP 120/74 (BP Location: Left Arm, Patient Position: Sitting, Cuff Size: Large)   Pulse 63   Ht 6' 3 (1.905 m)   Wt 254 lb (115.2 kg)   SpO2 96%   BMI 31.75 kg/m   Constitutional: generally well-appearing, no acute distress Psychiatric: alert and oriented x3, cooperative Eyes: extraocular movements intact, anicteric, conjunctiva pink Mouth: oral pharynx moist, no lesions Neck: supple no lymphadenopathy Cardiovascular: heart regular rate and rhythm, no murmur Lungs: clear to auscultation bilaterally Abdomen: soft, nontender, nondistended, no obvious ascites, no peritoneal signs, normal bowel sounds, no organomegaly Rectal: Omitted Extremities: no clubbing, cyanosis, or lower extremity edema bilaterally Skin: no lesions on visible extremities Neuro: No focal  deficits.  Cranial nerves intact  ASSESSMENT:  1.  Intermittent problems with loose stools.  Ongoing.  Possibly Aricept . 2.  Isolated bout of abdominal pain with inflammation at the tail of the pancreas on CT suggesting pancreatitis.  Normal lipase.  Follow-up CT June 22, 2023 about the pancreas appearing normal with incidental duodenal diverticulum. 3.  Multiple medical problems 4.  GERD 5.  History of adenomatous polyps.  Surveillance up-to-date with   PLAN:  1.  Prescribe metronidazole  250 mg p.o. 3 times daily x 10 days for possible bacterial overgrowth.  He has found this helpful in the past.  Several refills provided for on-demand use 2.  Encouraged to use Imodium more regularly as this helps 3.  Schedule MRI of the pancreas/MRCP to further evaluate an isolated episode of pancreatitis in an elderly gentleman with weight loss and new onset diabetes.  Rule out occult malignancy. 4.  GI office follow-up 3 months 5.  Ongoing general medical care with Dr. Janey A total time of 40  minutes was spent preparing to see the patient, reviewing emerita outside data, obtaining comprehensive history, performing medically appropriate physical examination, counseling and educating the patient regarding the above listed issues, ordering medication, ordering advanced radiology study, arranging follow-up, and documenting clinical information in the health record

## 2023-11-28 ENCOUNTER — Encounter: Payer: Self-pay | Admitting: Internal Medicine

## 2023-11-28 ENCOUNTER — Telehealth: Payer: Self-pay | Admitting: *Deleted

## 2023-11-28 NOTE — Telephone Encounter (Signed)
 Called patient in reference to making an appt with imaging for already ordered MRI. Phone number given to patient to make the appt. Patient also called about his 73 year old daughter wanting to be seen via [facility]. Informed the patient to have his daughter call to make an appt as a new patient and she will be scheduled. Patient understood and agreed.

## 2023-11-30 DIAGNOSIS — E291 Testicular hypofunction: Secondary | ICD-10-CM | POA: Diagnosis not present

## 2023-12-05 ENCOUNTER — Other Ambulatory Visit: Payer: Self-pay | Admitting: Internal Medicine

## 2023-12-05 ENCOUNTER — Ambulatory Visit (HOSPITAL_COMMUNITY)
Admission: RE | Admit: 2023-12-05 | Discharge: 2023-12-05 | Disposition: A | Discharge status: Home / Self Care | Payer: Medicare HMO | Source: Ambulatory Visit | Attending: Internal Medicine | Admitting: Internal Medicine

## 2023-12-05 DIAGNOSIS — I700 Atherosclerosis of aorta: Secondary | ICD-10-CM | POA: Diagnosis not present

## 2023-12-05 DIAGNOSIS — K5710 Diverticulosis of small intestine without perforation or abscess without bleeding: Secondary | ICD-10-CM | POA: Diagnosis not present

## 2023-12-05 DIAGNOSIS — Z9049 Acquired absence of other specified parts of digestive tract: Secondary | ICD-10-CM | POA: Diagnosis not present

## 2023-12-05 DIAGNOSIS — K859 Acute pancreatitis without necrosis or infection, unspecified: Secondary | ICD-10-CM | POA: Diagnosis not present

## 2023-12-05 DIAGNOSIS — R197 Diarrhea, unspecified: Secondary | ICD-10-CM | POA: Insufficient documentation

## 2023-12-05 DIAGNOSIS — H401132 Primary open-angle glaucoma, bilateral, moderate stage: Secondary | ICD-10-CM | POA: Diagnosis not present

## 2023-12-05 DIAGNOSIS — R109 Unspecified abdominal pain: Secondary | ICD-10-CM | POA: Diagnosis not present

## 2023-12-05 MED ORDER — GADOBUTROL 1 MMOL/ML IV SOLN
10.0000 mL | Freq: Once | INTRAVENOUS | Status: AC | PRN
  Administered 2023-12-05: 10 mL via INTRAVENOUS

## 2023-12-06 DIAGNOSIS — M47816 Spondylosis without myelopathy or radiculopathy, lumbar region: Secondary | ICD-10-CM | POA: Diagnosis not present

## 2023-12-07 ENCOUNTER — Encounter: Payer: Medicare HMO | Attending: Psychology | Admitting: Psychology

## 2023-12-07 DIAGNOSIS — G4730 Sleep apnea, unspecified: Secondary | ICD-10-CM | POA: Diagnosis not present

## 2023-12-07 DIAGNOSIS — F09 Unspecified mental disorder due to known physiological condition: Secondary | ICD-10-CM | POA: Diagnosis not present

## 2023-12-07 DIAGNOSIS — R413 Other amnesia: Secondary | ICD-10-CM | POA: Diagnosis not present

## 2023-12-07 NOTE — Progress Notes (Signed)
 Neuropsychological Evaluation   Patient:  Brian Cortez   DOB: 11-30-1950  MR Number: [id number]  Location: [facility] [address] [place], [place] [address] [place] [address] Dept: [phone]  Start: 8 AM End: 9 AM  Provider/Observer:     Marrion Sjogren PsyD  Chief Complaint:      Chief Complaint  Patient presents with   Memory Loss    Reason For Service:     Brian Cortez is a 73 year old male that was initially referred for neuropsychological evaluation back in 2022 by his treating neurologist Brian Banner, MD due to reports of increasing difficulties with his memory and mild decrease worsening of screening/MoCA assessments over time.  All of my previous notes can be found in the patient's EMR and I have included primary information below from each of the visits dating back to the initial clinical interview.  The patient had initial formal neuropsychological testing in report produced 11/03/2021 and we repeated that clinical test battery on 08/23/2023.  In the test results section below both of these testing data sets are listed with appropriate date above for direct comparisons.  During the initial neuropsychological evaluation in 2022 there were indications of decline in overall/global intellectual and cognitive functioning there were consistent with the patient's subjective reports.  Expressive and receptive language appeared to remain intact as well as information processing speed.  There were numerous issues of significant frustration and difficulty for the patient with the initial testing that are documented in the same types of frustrations were seen during the most recent evaluation.  There were greater memory deficits for visual versus auditory memory functions but the patient displayed significant improvements for both visual and auditory memory under  cueing/recognition formats.  Diagnostic considerations at the time did suggest decrease in global functioning showing mild to moderate changes compared to historic/premorbid functioning level for addiction.  After the initial evaluation the data set did not allow to completely rule out the possibility of a progressive cortical dementia/major neurocognitive disorder but patterns were not consistent with Lewy body or cerebral vascular/microvascular ischemic disease type changes.  Repeat testing was scheduled and the patient was diagnosed with a mild cognitive disorder with memory loss.  Below each of these clinical interview visits are listed and reversed Chronicle order.  08/08/2023:  This was a follow-up visit with the patient. I had initially seen him back in 2022 for the initial neuropsychological evaluation, which can be found in the patient's EMR. The patient showed symptoms consistent with subjective reports of changes in memory function from premorbid status with the inability to rule out an Alzheimer's type process. Patient's neuropsychological evaluation was not consistent with Lewy body dementia or other types of progressive degenerative conditions. However, this was not definitive in nature during our first evaluation and repeat evaluation was planned. The patient returned in 2023 and at that time reported little to no change over the prior year and the patient continued with steady MoCA testing during neurological evaluations. We decided at that point to hold off. The patient returns today reporting that he has remained fairly stable as far as cognitive functioning but has noted some mild changes over the past 2 years. The patient reports that he has, however, continued to have significant pain difficulties with compression of his spine and had an acute onset of uncontrolled sweating that was challenging to figure out  causative factors. Eventually, they reduced the amount of testosterone  he was being  given and the sweating appears to have stopped. Patient also had an episode of very painful pancreatitis that has gotten better. The patient denies significant changes in visual spatial functioning and he has continued to be active in his life. He notes some mild changes in memory over the past 2 years with continued differences from baseline. We have set the patient up for follow-up neuropsychological evaluation and we will repeat the test battery that was administered in 2022 for direct comparison purposes.   08/04/2022:  During this clinical visit, which was face-to-face clinical interview, the patient reports that he feels like he is doing better than on the previous neuropsychological evaluation.  He did offer information regarding the previous testing that he did not relate on the day of the testing.  Patient reports that 2 days before he was in the emergency department for 15 hours waiting to be seen for diverticulitis and was still very tired with lack of sleep due to this time.  He reports that he was concerned about delaying his visit in our office because of that and went ahead and went through with the testing.  This may have affected him to some degree.   The patient reports that his health has been much better with better blood pressure numbers and is now often below 120/70 with blood pressure readings and reports that his overall blood work has also improved.  The patient reports that he has reduced his alcohol  intake considerably but does continue to have some alcohol  consumption.  He continues to be compliant with his CPAP machine.  Patient feels like his overall health has been better and he reports that he is doing better as far as memory.  Patient had improvement in his MoCA testing with Dr. Albertina Cortez recently.  Patient reports that he had knee replacement surgery of his right knee at [facility] and reports improvement but continued to have some degree of pain.  The surgery was done in April.  The  patient clearly feels like his memory is better than it was when we first initiated this process which would argue against any type of progressive dementia type process.  The patient continues to take testosterone  which had been discontinued for some period of time but now has resumed.  The patient feels like the Aricept  that he has been taking with prescription from Dr. Albertina Cortez does help with his memory.  This may be related to some degrees of normal age-related microvascular ischemic changes.  He denies any side effects from this.   At this point, we are not going to do repeat testing and instead schedule a follow-up appointment in 1 year to assess for the need for repeat testing at that point.  The patient continues to remain stable and doing well with no indications of progression we may not do repeat testing even 1 year from now.  That determination will be made with a future appointment.  10/08/2021 Initial Clinical Interview and background (in EMR):  Brian Cortez is a 73 year old male referred by Brian Banner, MD for neuropsychological evaluation as part of a larger neurological work-up.  The patient has been followed by Dr. Albertina Cortez for many years for obstructive sleep apnea and has been extremely compliant with his CPAP device over the years.  However, he has begun reporting increasing difficulties with his memory with first report to Dr. Albertina Cortez occurring in March of this year.  Recent MoCA  cognitive testing has shown a decline from previous assessments with particular concerns about difficulties on serial sevens and following multistep commands.  He did well on other aspects of the MoCA testing with the exception of new trouble with completing the Trail Making Test.  He has been started on he was started on Aricept  daily that is well up to 2 mg a day.  Recent MRI was unremarkable.  During the clinical interview today, the patient reports that he continues to be very compliant with his  CPAP use and feels like the addition of Aricept  to his medication regimen has improved his memory functions as much is 50% improvement.  The patient reports that he began noticing some memory changes approximately 3 years ago and describes it as a gradual change over this time rather than stepwise changes with some recent improvement with Aricept .  The patient reports on rare occasions that he will have some difficulties with word finding but this is certainly not every day or once per week and frequency.  He denies any tremors or any visual hallucinations.  He also denies any geographic disorientation's or other changes in visual-spatial or visual processing changes.  The patient reports that he has had some stressors around a reoccurrence of his bladder cancer.  The patient was treated for bladder cancer and has been followed up and they recently found a reoccurrence which has been addressed.  He has had various interventions for his bladder cancer including specific targeted chemotherapy, BCG wash etc.  The patient reports that his mood is very positive and denies any symptoms of anxiety or depression.  The patient reports that he started taking Lexapro  many years ago to address infrequent but significant panic events.  The patient reports that his perception and worries during these panic events had to do with health concerns as his father died of a heart attack i 70 and the patient's older brother had become anxious and obsessive with fears that his older brother would die at the same age.  His older brother did not pass away at that time.  The patient reports that he would have fears that would be exacerbated by his brother's fears leading to some panic events.  The patient reports that he has had not had any side effects from the Lexapro  and feels like it is quite helpful and keeping the panic events at bay.  The patient describes his only significant hospitalizations as having to do with his bladder cancer  with 2 previous hospitalizations, a meniscus tear and upcoming right knee replacement planned in May 2023.  He reports that he is still having some small tumors develop in his bladder which are being addressed through oncology.  The patient acknowledges some worry with a reoccurrence of these tumors but does not feel like he is being overwhelmed with anxiety regarding them.  The patient reports that he has good sleep patterns with good CPAP compliance, that his appetite is good and there is no specific changes.  There is no significant history of progressive dementia is that the patient is aware of.  His father passed away from a heart attack at 85 years of age and his mother passed away at 21 years of age.  The patient denies any tremors, visual hallucinations or significant changes in geographic orientation.  No other specific symptoms besides his subjective memory changes are noted.  The patient has a 73 year old daughter who has been diagnosed with ADD/OCD and anxiety and has a 24 year old daughter who  is in good health with no psychiatric or neurological history.   Tests Administered: Martie Slaughter Executive Functioning System (D-KEFS), Verbal Fluency  Trail Making Test (TMT; Part A & B) Wechsler Adult Intelligence Scale, 4th Edition (WAIS-IV) Wechsler Memory Scale, 4th Edition (WMS-IV);Older Adult Battery  Participation Level:   Active  Participation Quality:  Appropriate      Behavioral Observation:  The patient appeared well-groomed and appropriately dressed. His manners were polite and appropriate to the situation. The patient's attitude towards testing was positive and his effort was good. The patient became frustrated during the Verbal Paired Associates subtest of the WMS-IV. The patient was also frustrated by the [employer], scribbling on the page upon attempting part B.   Well Groomed, Alert, and Appropriate.  The patient did again have significant frustration completing the Trail  Making Test part B as he did during the first assessment and both of these individual subtest were terminated before completion.  On the most recent evaluation the patient also had frustration during the verbal paired associate subtest of the Wechsler Memory Scale but it was continued through completion.  Test Results:   Initially in 2022, an estimation was made as to the patient's premorbid intellectual and cognitive functioning to be used as comparison point for assessing objective testing results. 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 For comparison point, we will use a conservative estimation of a premorbid intellectual and cognitive functioning around standard score of 120 with the patient estimated to be at least 1 standard deviation above normative populations at least with regard to measures that are sensitive to overall intellectual and cognitive type functions.  As far as validity of these assessments, overall, this does appear to be a valid assessment although caution needs to be made in over interpreting the first visit as the patient describes some issues with lack of sleep 2 days before the initial evaluation and on the most recent evaluation there were times of frustration similar to the first admission.  Embedded validity checks do suggest that the patient tried his hardest throughout with no attempts to Frain or mimic some type of deficit.  This does appear to be a valid assessment with verbal paired Associates and Trail Making Test part B interpretations being made with caution.  11/03/2021:  D-KEFS Verbal Fluency Tests:   Verbal Fluency Test (Standard) Letter Fluency Total Correct           10                                                                                                                                  Verbal Fluency Test (Standard) Category  Fluency Total Correct     13                                                                                                                                                                     Verbal Fluency Test (Standard) Category Switching Total Correct   11                                                                                                                                             Verbal Fluency Test (Standard) Switching Accuracy11   08/23/2023:   D-KEFS Verbal Fluency:  Verbal Fluency Test (Standard) Letter Fluency total correct scaled score             8                                                                                                           Verbal Fluency Test (Standard) Category Fluency total correct scaled score          9  Verbal Fluency Test (Standard) Category Switching total correct scaled score       11                                                                                                                                             Verbal Fluency Test (Standard) Switching accuracy total correct scaled score12   Overall, the patient does show some mild decrease in verbal fluency types of measures including lexical and semantic fluency.  These are only mild in nature and the patient continues to perform in the average range currently with subtle changes from previous assessment.  However, these are not overly significant and the biggest change had to do with mild reductions in lexical fluency but other expressive language measures were essentially the same as the initial evaluation.  11/03/2021:   Trail Making Test  Part A Time = 33.80 seconds 0 Errors Part B *did not  complete* Time = 2:05.95 6 Errors  Patient got very frustrated after making errors and discontinued the test.  08/23/2023: Testing  Trail Making Test:  Part A Time= 35.42 seconds 1 error   Part B  Time= 2:45:91 Did not complete, became frustrated and scribbled on page  On the Trail Making Test, the patient continued to perform very similarly on part a subtest which measures primary focus execute/information processing speed.  On the part B subtest the patient became frustrated on both administrations in 2022 and 2024 with them being discontinued before completion.  There does not appear to be any significant change between 2022 and 2024. WAIS-IV  11/03/2021                       Composite Score Summary              Scale Sum of Scaled Scores Composite Score Percentile Rank 95% Conf. Interval Qualitative Description  Verbal Comprehension 29 VCI 98 45 92-104 Average  Perceptual Reasoning 22 PRI 84 14 79-91 Low Average  Working Memory 15 WMI 86 18 80-94 Low Average  Processing Speed 21 PSI 102 55 93-110 Average  Full Scale 87 FSIQ 91 27 87-95 Average  General Ability 51 GAI 91 27 86-96 Average    08/23/2023   WAIS-IV:            Composite Score Summary          Scale Sum of Scaled Scores Composite Score Percentile Rank 95% Conf. Interval Qualitative Description  Verbal Comprehension 30 VCI 100 50 94-106 Average  Perceptual Reasoning 27 PRI 94 34 88-101 Average  Working Memory 17 WMI 92 30 86-99 Average  Processing Speed 24 PSI 111 77 102-118 High Average  Full Scale 98 FSIQ 98 45 94-102 Average  General Ability 57 GAI 97 42 92-102 Average    The patient was administered the Wechsler Adult Intelligence Scale-IV to provide  an objective highly standardized well normed assessment of a wide range of intellectual and cognitive functioning domains.  As the patient has been describing changes in memory and other cognitive changes the current score should not be used as a  description of his lifelong intellectual and cognitive functioning but rather a estimation of current functioning levels.  Comparing the 2022 versus 2024 administration the patient actually shows better performance on the most recent evaluation and clearly no decline in overall function.  The patient has volunteered previously that he was rather fatigued during the first assessment after spending the night in the emergency department 2 nights prior and not sleeping at all.  While the most recent evaluation continues to be below predicted levels of premorbid functioning there were significant improvements with regard to information processing speed measures and visual-spatial measures from 20 22-20 24.  Global cognitive functioning continues to be below premorbid estimates which would have placed the patient in the high average to superior range premorbidly with current functioning in the average range but there does not appear to have been any decline over the past 2 years and this is consistent with the patient's reports over the past 2 years of steady or mild improvements from our initial visit in 2022.   2022                  Verbal Comprehension Subtests Summary          Subtest Raw Score Scaled Score Percentile Rank Reference Group Scaled Score SEM  Similarities 21 9 37 8 0.95  Vocabulary 39 11 63 11 0.67  Information 13 9 37 10 0.73  (Comprehension) 26 12 75 11 1.27    2024    Verbal Comprehension Subtests Summary        Subtest Raw Score Scaled Score Percentile Rank Reference Group Scaled Score SEM  Similarities 23 10 50 9 0.95  Vocabulary 35 10 50 10 0.67  Information 15 10 50 11 0.73   The patient continues to show very similar performance between the 2022 and 2024 assessments continuing to perform in the average range on all subtest administered including verbal reasoning, vocabulary knowledge and general fund of information measures.  There is clearly no decline in verbal based  information processing.   2022                  Perceptual Reasoning Subtests Summary          Subtest Raw Score Scaled Score Percentile Rank Reference Group Scaled Score SEM  Block Design 24 8 25 6  0.99  Matrix Reasoning 6 6 9 3  0.90  Visual Puzzles 8 8 25 6  0.99  (Picture Completion) 4 5 5 3  0.99     2024  Perceptual Reasoning Subtests Summary        Subtest Raw Score Scaled Score Percentile Rank Reference Group Scaled Score SEM  Block Design 34 11 63 8 0.99  Matrix Reasoning 7 7 16 3  0.90  Visual Puzzles 10 9 37 7 0.99     While the patient continues to show weakness with regard to measures of hold part recognition and nonverbal reasoning capacity his performance on the current assessment is the same or better than the initial assessment.  There clearly is no worsening in visual-spatial/visual processing type capacities.   2022                  Working English as a second language teacher  Subtest Raw Score Scaled Score Percentile Rank Reference Group Scaled Score SEM  Digit Span 21 8 25 6  0.73  Arithmetic 9 7 16 6  1.20    2024   Working Memory Subtests Summary        Subtest Raw Score Scaled Score Percentile Rank Reference Group Scaled Score SEM  Digit Span 19 7 16 5  0.73  Arithmetic 13 10 50 9 1.20   Overall, the patient shows consistent scores and improvement in his capacity to actively process information in his auditory Register.  There does not appear to be any appreciable change overall in auditory encoding capacity and processing.    2022                 Processing Speed Subtests Summary        Subtest Raw Score Scaled Score Percentile Rank Reference Group Scaled Score SEM  Symbol Search 16 7 16 4  1.12  Coding 68 14 91 9 1.12       2024  Processing Speed Subtests Summary        Subtest Raw Score Scaled Score Percentile Rank Reference Group Scaled Score SEM  Symbol Search 20 9 37 5 1.12  Coding 74 15 95 10 1.12    The patient actually shows improvement  between 2022 and 2024 with excellent focus execute abilities on at least 1 subtest and consistencies between the 2 assessments.   11/03/2021: WMS                    Index Score Summary            Index Sum of Scaled Scores Index Score Percentile Rank 95% Confidence Interval Qualitative Descriptor  Auditory Memory (AMI) 35 93 32 87-100 Average  Visual Memory (VMI) 11 76 5 72-82 Borderline  Immediate Memory (IMI) 27 93 32 87-100 Average  Delayed Memory (DMI) 19 77 6 71-86 Borderline    08/23/2023:      WMS-IV:          Index Score Summary        Index Sum of Scaled Scores Index Score Percentile Rank 95% Confidence Interval Qualitative Descriptor  Auditory Memory (AMI) 41 101 53 95-107 Average  Visual Memory (VMI) 18 95 37 90-100 Average  Immediate Memory (IMI) 30 100 50 94-106 Average  Delayed Memory (DMI) 29 98 45 91-106 Average   The patient shows improved memory performance comparing the 2022 and 2024 assessments.  The patient actually showed improved performance for the verbal paired associate subtest between these 2 assessments even though he clearly demonstrated some frustration during this task on the most recent assessment.  There is no significant difference and only mild relative differences between auditory versus visual memory with auditory memory continuing to be stronger.  However, the patient has a significantly improved score for visual memory functions as he produced a visual memory index score of 76 during 2022 evaluation and a visual memory index score of 95 on the most recent evaluation.  Also of note is significant improvements on the delayed memory index between these 2 assessment dates.  This is primarily due to significant improvements in both verbal paired associates subtest as well as the visual reproduction delayed memory subtest.  The patient consistently showed good performance under recognition/cued recall for both assessments.  2022:                 Primary Subtest Scaled Score Summary        Subtest  Domain Raw Score Scaled Score Percentile Rank  Logical Memory I AM 35 11 63  Logical Memory II AM 23 12 75  Verbal Paired Associates I AM 10 6 9   Verbal Paired Associates II AM 3 6 9   Visual Reproduction I VM 29 10 50  Visual Reproduction II VM 0 1 0.1  Symbol Span VWM 15 9 37                 Auditory Memory Process Score Summary          Process Score Raw Score Scaled Score Percentile Rank Cumulative Percentage (Base Rate)  LM II Recognition 20 - - >75%  VPA II Recognition 25 - - 17-25%                   Visual Memory Process Score Summary          Process Score Raw Score Scaled Score Percentile Rank Cumulative Percentage (Base Rate)  VR II Recognition 6 - - >75%    08/23/2023:            Primary Subtest Scaled Score Summary       Subtest Domain Raw Score Scaled Score Percentile Rank  Logical Memory I AM 33 11 63  Logical Memory II AM 24 12 75  [facility] I AM 15 8 25   [facility] II AM 6 10 50  Visual Rep[facility] I VM 33 11 63  Visual Reproduction II VM 10 7 16   Symbol Span VWM 17 10 50          Auditory Memory Process Score Summary      Process Score Raw Score Scaled Score Percentile Rank Cumulative Percentage (Base Rate)  LM II Recognition 20 - - >75%  VPA II Recognition 24 - - 10-16%         Visual Memory Process Score Summary      Process Score Raw Score Scaled Score Percentile Rank Cumulative Percentage (Base Rate)  VR II Recognition 6 - - >75%    Summary of Results:   Overall, the patient continues to show global cognitive functioning that is roughly 1 standard deviation below predicted levels of premorbid functioning but overall the patient is continue to perform the same or even better in many cognitive domains comparing the 2022 assessment in 2020 for assessment.  There is clearly been no progressive decline between these 2 epics of time.  The patient showed significant  improvements with regard to visual memory between these 2 assessments and continues to show excellent recognition performance for both visual and auditory information.  Most significant improvements between the 2 assessments had to do with improved visual spatial/visual constructional capacity, information processing speed capacity, improvements in overall memory functions, and some improvements in auditory and visual encoding.  While none of these improvements were particularly dramatic in nature they did clearly indicate either a stability or improvement between these 2 assessments.  Impression/Diagnosis:   The results of the current neuropsychological evaluation with direct comparisons to previous objective neuropsychological assessment and combined with available clinical information are very encouraging.  While the patient continues to perform below premorbid estimates globally and some areas individually are specifically below premorbid estimates overall, the patient is stayed the same or improved over the past 2 years as far as objective assessment and subjective reports also identify improvements overall.  Expressive and receptive language, memory functions, information processing speed, visual-spatial and visual constructional capacity although stayed the same or improved  over this time.  As far as diagnostic considerations, the patient would continue to meet a diagnostic criteria for mild cognitive decline/impairment with current and previous assessments being below predicted levels of premorbid functioning.  The patient is also continuing to report performances in various cognitive domains to be below his perceived premorbid functioning as well.  However, the likelihood of this representing some type of progressive degenerative major neurocognitive disorder is increasingly unlikely and the patient does not show patterns of cognitive strengths and weaknesses consistent with degenerative condition such as  Alzheimer's, Lewy body, Parkinson's, etc.  As far as recommendations, the patient should continue with his diligent use of his CPAP device and continuing to work on good sleep hygiene.  Maintaining good diet will also be important as the patient has shown some age consistent microvascular changes.  The patient continues to respond well to his SSRI/Lexapro  regimen without reported side effects.  It may be worth a within subject trial/ABAB trial around his prescription of Aricept .  The patient has been taking it for some time now and while he has reported he feels like it is helpful it may be worth a trial of discontinuing it for a month or so and noting any changes and then restarting it again and then a trial with again.  This will help the patient to determine whether Aricept  is helping him with his memory overall more clearly.  In any event, the patient is not describing any side effects per se from his Aricept  and does feel like it has helped his memory some.  I will sit down with the patient and go over the results and the comparisons made above and address specific recommendations for the patient individually outside of medical recommendations.  Diagnosis:    Mild cognitive disorder  Memory deficits  Sleep apnea with use of continuous positive airway pressure (CPAP)   _____________________ Chapman Commodore, Psy.D. Clinical Neuropsychologist

## 2023-12-13 ENCOUNTER — Encounter: Payer: Self-pay | Admitting: Internal Medicine

## 2023-12-13 DIAGNOSIS — E118 Type 2 diabetes mellitus with unspecified complications: Secondary | ICD-10-CM | POA: Diagnosis not present

## 2023-12-13 DIAGNOSIS — E785 Hyperlipidemia, unspecified: Secondary | ICD-10-CM | POA: Diagnosis not present

## 2023-12-13 DIAGNOSIS — I129 Hypertensive chronic kidney disease with stage 1 through stage 4 chronic kidney disease, or unspecified chronic kidney disease: Secondary | ICD-10-CM | POA: Diagnosis not present

## 2023-12-13 DIAGNOSIS — N1832 Chronic kidney disease, stage 3b: Secondary | ICD-10-CM | POA: Diagnosis not present

## 2023-12-13 DIAGNOSIS — I2584 Coronary atherosclerosis due to calcified coronary lesion: Secondary | ICD-10-CM | POA: Diagnosis not present

## 2023-12-13 DIAGNOSIS — E669 Obesity, unspecified: Secondary | ICD-10-CM | POA: Diagnosis not present

## 2023-12-14 DIAGNOSIS — E291 Testicular hypofunction: Secondary | ICD-10-CM | POA: Diagnosis not present

## 2023-12-15 ENCOUNTER — Encounter (HOSPITAL_BASED_OUTPATIENT_CLINIC_OR_DEPARTMENT_OTHER): Payer: Medicare HMO | Admitting: Psychology

## 2023-12-15 DIAGNOSIS — F09 Unspecified mental disorder due to known physiological condition: Secondary | ICD-10-CM

## 2023-12-15 DIAGNOSIS — G4730 Sleep apnea, unspecified: Secondary | ICD-10-CM | POA: Diagnosis not present

## 2023-12-15 DIAGNOSIS — R413 Other amnesia: Secondary | ICD-10-CM

## 2023-12-20 ENCOUNTER — Ambulatory Visit: Payer: Medicare HMO | Attending: Cardiology | Admitting: Cardiology

## 2023-12-20 ENCOUNTER — Encounter: Payer: Self-pay | Admitting: Cardiology

## 2023-12-20 VITALS — BP 110/72 | HR 61 | Ht 75.0 in | Wt 254.4 lb

## 2023-12-20 DIAGNOSIS — I420 Dilated cardiomyopathy: Secondary | ICD-10-CM | POA: Diagnosis not present

## 2023-12-20 DIAGNOSIS — I2510 Atherosclerotic heart disease of native coronary artery without angina pectoris: Secondary | ICD-10-CM

## 2023-12-20 DIAGNOSIS — E782 Mixed hyperlipidemia: Secondary | ICD-10-CM | POA: Diagnosis not present

## 2023-12-20 DIAGNOSIS — I10 Essential (primary) hypertension: Secondary | ICD-10-CM

## 2023-12-20 NOTE — Addendum Note (Signed)
Addended by: Luellen Pucker on: 12/20/2023 03:04 PM   Modules accepted: Orders

## 2023-12-20 NOTE — Progress Notes (Signed)
12/20/2023 Brian Cortez   28-Jan-1951  [id number]  Primary Physician Chilton Greathouse, MD Primary Cardiologist: Dr. Mayford Knife Electrophysiologist: None   Reason for Visit/CC: f/u for CAD  HPI:  Brian Cortez is a 73 y.o. male with a hx of hyperlipidemia, HTN, DCM with prior EF 45-50% with mild global hypokinesis, coronary artery calcification on a chest CT with normal nuclear stress test and EF was 63% on the Cardiolite (contrasting with the echo).  He also has a history of bladder cancer and had resection and BCG treatment.  He had a coronary Ca score done with a score of 1582 (LAD 590, LCx 470 and RCA 521) in 2024.  Stress PET CT showed no ischemia and normal coronary blood flow.  Since I saw him he had pancreatitis and then was left with DM2 and started on Jardiance. He has been coping with spinal stenosis recently and has had multiple injections.   He is here today for followup and is doing well.  He denies any chest pain or pressure, SOB, DOE, PND, orthopnea, LE edema, dizziness (except standing up too fast), palpitations or syncope. He is compliant with his meds and is tolerating meds with no SE.    Allergies  Allergen Reactions   Bee Pollen     Other reaction(s): Cough "sinus issues"   Pollen Extract Cough    "sinus issues"   Short Ragweed Pollen Ext Itching and Shortness Of Breath   Oxycodone Itching   Percocet [Oxycodone-Acetaminophen] Hives   Pine Tar Itching and Other (See Comments)    Sinus congestion   Sulfamethoxazole Nausea And Vomiting   Sulfamethoxazole-Trimethoprim Rash   Past Medical History:  Diagnosis Date   Acute pancreatitis    Anxiety    Bladder tumor    Carpal tunnel syndrome, left    intermittant   Chronic seasonal allergic rhinitis    Coronary artery disease    Coronary calcium score is very high at 1582   DDD (degenerative disc disease), cervical    Diabetes mellitus type 2 in nonobese (HCC)    Dilated cardiomyopathy (HCC)     Diverticulosis of colon    Fecal incontinence    GERD (gastroesophageal reflux disease)    Grade II diastolic dysfunction 06/17/2017   per ECHO    Hemorrhoids    Hiatal hernia    History of adenomatous polyp of colon    2001;  2007;  2012   History of bladder cancer urologist-  dr Patsi Sears   11-06-2013  s/p TURBT and BCG tx's   History of kidney stones    about 45 years ago   History of panic attacks    History of urethral stricture    Hyperlipidemia    Hypertension    Hypogonadism in male    LVH (left ventricular hypertrophy) 06/17/2017   Mild, noted on ECHO   Nonischemic cardiomyopathy [facility])    cardiologist-  dr Shirlee Latch-- per last echo 01/ 2016 ef 50-55%  (up from 09/ 2014 ef 45-50%)   OSA on CPAP    since 2014   Pneumonia 11/2010   Left lower lobe   Pulmonary hypertension (HCC) 06/17/2017   Mild, per ECHO   Family History  Problem Relation Age of Onset   Heart attack Father 35   Stroke Father 74   Heart attack Paternal Grandfather        in 24s   Prostate cancer Paternal Uncle    Heart attack Paternal Uncle         >  55   Heart attack Maternal Uncle 80       > 55   Colon cancer Neg Hx    Diabetes Neg Hx    Past Surgical History:  Procedure Laterality Date   CARDIOVASCULAR STRESS TEST  08-21-2013  dr Shirlee Latch   normal nuclear study w/ no ischemia/  normal LV function and wall motion , ef 63%   CATARACT EXTRACTION W/ INTRAOCULAR LENS  IMPLANT, BILATERAL  2013   COLONOSCOPY  last one 03-03-2016   CYSTO/  DIRECT VISION INTERNAL URETEROTOMY  11/25/2006   LAPAROSCOPIC CHOLECYSTECTOMY  10/15/2006   MENISCUS REPAIR Right 12/2020   ORBITAL FRACTURE SURGERY  1986   left eye:  Jan 1986--- fixation of fractures, repair of optic nerve decompression , and complex closure   TONSILLECTOMY  child   TRANSTHORACIC ECHOCARDIOGRAM  12-16-2014   dr Shirlee Latch   grade 1 diastolic dysfunction, ef 50-55% (improved from last study in 2014 , previous ef 45-50%) /  trivial MR and TR/  mild  LAE/  inferior vena cava dilated, respirophasic diameter changes were blunted (<50%), consistant w/ elevated central venous pressure   TRANSURETHRAL RESECTION OF BLADDER TUMOR Bilateral 05/16/2018   Procedure: TRANSURETHRAL RESECTION OF BLADDER TUMOR (TURBT)/ RETROGRADE;  Surgeon: Jerilee Field, MD;  Location: [facility];  Service: Urology;  Laterality: Bilateral;   TRANSURETHRAL RESECTION OF BLADDER TUMOR WITH GYRUS (TURBT-GYRUS) N/A 11/06/2013   Procedure: TRANSURETHRAL RESECTION OF BLADDER TUMOR WITH GYRUS (TURBT-GYRUS);  Surgeon: Kathi Ludwig, MD;  Location: [facility];  Service: Urology;  Laterality: N/A;   TRANSURETHRAL RESECTION OF BLADDER TUMOR WITH MITOMYCIN-C N/A 12/09/2016   Procedure: CYSTOSCOPY TRANSURETHRAL RESECTION OF BLADDER TUMOR WITH MITOMYCIN-C;  Surgeon: Jethro Bolus, MD;  Location: [facility];  Service: Urology;  Laterality: N/A;   VARICOSE VEIN SURGERY  2002   VASECTOMY     Social History   Socioeconomic History   Marital status: Married    Spouse name: [name]   Number of children: 2   Years of education: Pharmacist, community   Highest education level: Not on file  Occupational History   Occupation: BANK -PRESIDENT    Employer: [employer]   Occupation: PRESIDENT    Employer: [employer]  Tobacco Use   Smoking status: Never   Smokeless tobacco: Never  Vaping Use   Vaping status: Never Used  Substance and Sexual Activity   Alcohol use: Yes    Comment: rarely   Drug use: No   Sexual activity: Not on file    Comment: VASECTOMY  Other Topics Concern   Not on file  Social History Narrative   Patient lives at home with spouse.    Caffeine Use: 3-4 cups   Social Drivers of Health   Financial Resource Strain: Not on file  Food Insecurity: Low Risk  (07/14/2023)   Received from [facility]   Hunger Vital Sign    Worried About Running Out of Food in the Last Year: Never true    Ran Out of Food in the Last Year: Never  true  Transportation Needs: Not on file (07/14/2023)  Physical Activity: Not on file  Stress: Not on file  Social Connections: Not on file  Intimate Partner Violence: Not on file     Lipid Panel     Component Value Date/Time   CHOL 138 02/25/2023 0806   TRIG 235 (H) 02/25/2023 0806   HDL 40 02/25/2023 0806   CHOLHDL 3.5 02/25/2023 0806   CHOLHDL 4 09/11/2014  4742   VLDL 57.0 (H) 09/11/2014 0738   LDLCALC 60 02/25/2023 0806   LDLDIRECT 67.7 09/11/2014 0738    Review of Systems: General: negative for chills, fever, night sweats or weight changes.  Cardiovascular: negative for chest pain, dyspnea on exertion, edema, orthopnea, palpitations, paroxysmal nocturnal dyspnea or shortness of breath Dermatological: negative for rash Respiratory: negative for cough or wheezing Urologic: negative for hematuria Abdominal: negative for nausea, vomiting, diarrhea, bright red blood per rectum, melena, or hematemesis Neurologic: negative for visual changes, syncope, or dizziness All other systems reviewed and are otherwise negative except as noted above.   Physical Exam:  GEN: Well nourished, well developed in no acute distress HEENT: Normal NECK: No JVD; No carotid bruits LYMPHATICS: No lymphadenopathy CARDIAC:RRR, no murmurs, rubs, gallops RESPIRATORY:  Clear to auscultation without rales, wheezing or rhonchi  ABDOMEN: Soft, non-tender, non-distended MUSCULOSKELETAL:  No edema; No deformity  SKIN: Warm and dry NEUROLOGIC:  Alert and oriented x 3 PSYCHIATRIC:  Normal affect   EKG Interpretation Date/Time:  Tuesday December 20 2023 14:27:14 EST Ventricular Rate:  61 PR Interval:  170 QRS Duration:  96 QT Interval:  410 QTC Calculation: 412 R Axis:   -53  Text Interpretation: Normal sinus rhythm Left axis deviation Incomplete right bundle branch block Minimal voltage criteria for LVH, may be normal variant ( R in aVL ) Inferior infarct , age undetermined When compared with ECG of  20-Mar-2022 19:06, PREVIOUS ECG IS PRESENT Confirmed by Armanda Magic [phone]) on 12/20/2023 2:56:27 PM    ASSESSMENT AND PLAN:   1. Coronary Artery Calcifications on Chest CT:  -Coronary calcium score 12/2022 was 1582 (LAD 590, LCx 470 and RCA 521) in 2024. -had NST that was negative for ischemia 2009 and 2014.  -Stress PET CT 02/09/2023 showed no ischemia with mildly reduced LV function at baseline EF 46% increased to 50% with stress with normal myocardial blood flow reserve -He has not any recent anginal chest pain -Continue prescription drug management with aspirin 81 mg daily, Crestor 40 mg daily with as needed refills  2. Dilated Cardiomyopathy:  -EF improved on most recent studies.  -EF previously 45-50%, but normal at 60-65% on last study in 2019.  -EF 46% on stress PET CT a year ago and augmented 50% with stress -no BB due to bradycardia -Repeat 2D echo  3. HTN:  -Controlled on exam today -has not required any antihypertensive therapy  4. HLD/Hypertriglyceridemia - LDL goal < 70 given coronary calcifications.  -I have personally reviewed and interpreted outside Labs performed by patient's PCP which showed LDL 40 and HDL 37 and triglycerides 151 on 06/21/2023 and ALT 32 on 10/12/2023 -Continue prescription drug management with fenofibrate 160 mg daily, Vascepa 2 g twice daily and Crestor 40 mg daily with as needed refills  Followup with me in 1 year  Emilia Beck, MHS [facility] 12/20/2023 2:51 PM

## 2023-12-20 NOTE — Patient Instructions (Signed)
Medication Instructions:  Your physician recommends that you continue on your current medications as directed. Please refer to the Current Medication list given to you today.  *If you need a refill on your cardiac medications before your next appointment, please call your pharmacy*   Lab Work: None.  If you have labs (blood work) drawn today and your tests are completely normal, you will receive your results only by: MyChart Message (if you have MyChart) OR A paper copy in the mail If you have any lab test that is abnormal or we need to change your treatment, we will call you to review the results.   Testing/Procedures: Your physician has requested that you have an echocardiogram. Echocardiography is a painless test that uses sound waves to create images of your heart. It provides your doctor with information about the size and shape of your heart and how well your heart's chambers and valves are working. This procedure takes approximately one hour. There are no restrictions for this procedure. Please do NOT wear cologne, perfume, aftershave, or lotions (deodorant is allowed). Please arrive 15 minutes prior to your appointment time.  Please note: We ask at that you not bring children with you during ultrasound (echo/ vascular) testing. Due to room size and safety concerns, children are not allowed in the ultrasound rooms during exams. Our front office staff cannot provide observation of children in our lobby area while testing is being conducted. An adult accompanying a patient to their appointment will only be allowed in the ultrasound room at the discretion of the ultrasound technician under special circumstances. We apologize for any inconvenience.    Follow-Up:  Your next appointment:   1 year(s)  Provider:   Armanda Magic, MD

## 2023-12-21 DIAGNOSIS — E291 Testicular hypofunction: Secondary | ICD-10-CM | POA: Diagnosis not present

## 2023-12-26 ENCOUNTER — Other Ambulatory Visit: Payer: Self-pay | Admitting: Cardiology

## 2023-12-27 ENCOUNTER — Encounter: Payer: Self-pay | Admitting: Cardiology

## 2023-12-28 ENCOUNTER — Other Ambulatory Visit: Payer: Self-pay

## 2023-12-28 DIAGNOSIS — E291 Testicular hypofunction: Secondary | ICD-10-CM | POA: Diagnosis not present

## 2023-12-28 MED ORDER — ICOSAPENT ETHYL 1 G PO CAPS: 2.0000 g | ORAL_CAPSULE | Freq: Two times a day (BID) | ORAL | 3 refills | Status: DC

## 2023-12-28 MED ORDER — FENOFIBRATE 160 MG PO TABS: 160.0000 mg | ORAL_TABLET | Freq: Every day | ORAL | 3 refills | Status: DC

## 2024-01-03 ENCOUNTER — Other Ambulatory Visit (HOSPITAL_COMMUNITY): Payer: Self-pay

## 2024-01-03 MED ORDER — DEXCOM G7 SENSOR MISC
1.0000 | Freq: Every day | 0 refills | Status: AC
  Filled 2024-01-03: qty 3, 30d supply, fill #0

## 2024-01-04 DIAGNOSIS — E291 Testicular hypofunction: Secondary | ICD-10-CM | POA: Diagnosis not present

## 2024-01-09 ENCOUNTER — Ambulatory Visit (HOSPITAL_COMMUNITY): Payer: Medicare HMO | Attending: Cardiology

## 2024-01-09 ENCOUNTER — Encounter: Payer: Self-pay | Admitting: Cardiology

## 2024-01-09 DIAGNOSIS — I420 Dilated cardiomyopathy: Secondary | ICD-10-CM | POA: Diagnosis not present

## 2024-01-09 LAB — ECHOCARDIOGRAM COMPLETE
Area-P 1/2: 2.64 cm2
S' Lateral: 2.8 cm

## 2024-01-11 DIAGNOSIS — E291 Testicular hypofunction: Secondary | ICD-10-CM | POA: Diagnosis not present

## 2024-01-13 ENCOUNTER — Other Ambulatory Visit: Payer: Self-pay | Admitting: Cardiology

## 2024-01-18 DIAGNOSIS — E291 Testicular hypofunction: Secondary | ICD-10-CM | POA: Diagnosis not present

## 2024-01-26 ENCOUNTER — Ambulatory Visit: Payer: Medicare HMO | Admitting: Psychology

## 2024-01-26 ENCOUNTER — Other Ambulatory Visit (HOSPITAL_COMMUNITY): Payer: Self-pay

## 2024-02-01 DIAGNOSIS — E291 Testicular hypofunction: Secondary | ICD-10-CM | POA: Diagnosis not present

## 2024-02-08 DIAGNOSIS — E291 Testicular hypofunction: Secondary | ICD-10-CM | POA: Diagnosis not present

## 2024-02-15 ENCOUNTER — Encounter: Payer: Self-pay | Admitting: Internal Medicine

## 2024-02-15 ENCOUNTER — Ambulatory Visit: Payer: Medicare HMO | Admitting: Internal Medicine

## 2024-02-15 VITALS — BP 110/80 | HR 68 | Ht 74.5 in | Wt 250.0 lb

## 2024-02-15 DIAGNOSIS — K219 Gastro-esophageal reflux disease without esophagitis: Secondary | ICD-10-CM | POA: Diagnosis not present

## 2024-02-15 DIAGNOSIS — R935 Abnormal findings on diagnostic imaging of other abdominal regions, including retroperitoneum: Secondary | ICD-10-CM

## 2024-02-15 DIAGNOSIS — R194 Change in bowel habit: Secondary | ICD-10-CM

## 2024-02-15 DIAGNOSIS — E291 Testicular hypofunction: Secondary | ICD-10-CM | POA: Diagnosis not present

## 2024-02-15 NOTE — Patient Instructions (Signed)
 Please follow up as needed.  _______________________________________________________  If your blood pressure at your visit was 140/90 or greater, please contact your primary care physician to follow up on this.  _______________________________________________________  If you are age 73 or older, your body mass index should be between 23-30. Your Body mass index is 31.67 kg/m. If this is out of the aforementioned range listed, please consider follow up with your Primary Care Provider.  If you are age 30 or younger, your body mass index should be between 19-25. Your Body mass index is 31.67 kg/m. If this is out of the aformentioned range listed, please consider follow up with your Primary Care Provider.   ________________________________________________________  The [place] [facility] providers would like to encourage you to use [facility] to communicate with providers for non-urgent requests or questions.  Due to long hold times on the telephone, sending your provider a message by [facility] may be a faster and more efficient way to get a response.  Please allow 48 business hours for a response.  Please remember that this is for non-urgent requests.  _______________________________________________________

## 2024-02-15 NOTE — Progress Notes (Signed)
 HISTORY OF PRESENT ILLNESS:  Brian Cortez is a 73 y.o. male with multiple medical problems as listed below who presents today for follow-up.  He was evaluated in the office November 25, 2023 regarding intermittent problems with loose stools and a prior history of pancreatitis.  See that dictation for details.  He was prescribed metronidazole 250 mg p.o. 3 times daily x 10 days.  Also, encouraged to use Imodium more frequently.  We did schedule an MRI/MRCP of the pancreas to rule out underlying lesion as a cause for his chronic pancreatitis.  Fortunately, the examination was unremarkable with a normal-appearing pancreas.  Presents today for follow-up.  Patient tells me that after a course of metronidazole as well as using Imodium, about 1/day, he is loose bowel habits are 80% improved.  No new issues.  His last complete colonoscopy was 2017.  Follow-up around 2027 to be considered.  He also has GERD for which she takes pantoprazole.  REVIEW OF SYSTEMS:  All non-GI ROS negative except for back pain  Past Medical History:  Diagnosis Date   Acute pancreatitis    Anxiety    Bladder tumor    Carpal tunnel syndrome, left    intermittant   Chronic seasonal allergic rhinitis    Coronary artery disease    Coronary calcium score is very high at 1582   DDD (degenerative disc disease), cervical    Diabetes mellitus type 2 in nonobese (HCC)    Dilated cardiomyopathy (HCC)    Diverticulosis of colon    Fecal incontinence    GERD (gastroesophageal reflux disease)    Grade II diastolic dysfunction 06/17/2017   per ECHO    Hemorrhoids    Hiatal hernia    History of adenomatous polyp of colon    2001;  2007;  2012   History of bladder cancer urologist-  dr Patsi Sears   11-06-2013  s/p TURBT and BCG tx's   History of kidney stones    about 45 years ago   History of panic attacks    History of urethral stricture    Hyperlipidemia    Hypertension    Hypogonadism in male    LVH (left ventricular  hypertrophy) 06/17/2017   Mild, noted on ECHO   Nonischemic cardiomyopathy [facility])    cardiologist-  dr Shirlee Latch-- per last echo 01/ 2016 ef 50-55%  (up from 09/ 2014 ef 45-50%)   OSA on CPAP    since 2014   Pneumonia 11/2010   Left lower lobe   Pulmonary hypertension (HCC) 06/17/2017   Mild, per ECHO    Past Surgical History:  Procedure Laterality Date   CARDIOVASCULAR STRESS TEST  08-21-2013  dr Shirlee Latch   normal nuclear study w/ no ischemia/  normal LV function and wall motion , ef 63%   CATARACT EXTRACTION W/ INTRAOCULAR LENS  IMPLANT, BILATERAL  2013   COLONOSCOPY  last one 03-03-2016   CYSTO/  DIRECT VISION INTERNAL URETEROTOMY  11/25/2006   LAPAROSCOPIC CHOLECYSTECTOMY  10/15/2006   MENISCUS REPAIR Right 12/2020   ORBITAL FRACTURE SURGERY  1986   left eye:  Jan 1986--- fixation of fractures, repair of optic nerve decompression , and complex closure   TONSILLECTOMY  child   TRANSTHORACIC ECHOCARDIOGRAM  12-16-2014   dr Shirlee Latch   grade 1 diastolic dysfunction, ef 50-55% (improved from last study in 2014 , previous ef 45-50%) /  trivial MR and TR/  mild LAE/  inferior vena cava dilated, respirophasic diameter changes were blunted (<50%), consistant w/  elevated central venous pressure   TRANSURETHRAL RESECTION OF BLADDER TUMOR Bilateral 05/16/2018   Procedure: TRANSURETHRAL RESECTION OF BLADDER TUMOR (TURBT)/ RETROGRADE;  Surgeon: Jerilee Field, MD;  Location: [facility];  Service: Urology;  Laterality: Bilateral;   TRANSURETHRAL RESECTION OF BLADDER TUMOR WITH GYRUS (TURBT-GYRUS) N/A 11/06/2013   Procedure: TRANSURETHRAL RESECTION OF BLADDER TUMOR WITH GYRUS (TURBT-GYRUS);  Surgeon: Kathi Ludwig, MD;  Location: [facility];  Service: Urology;  Laterality: N/A;   TRANSURETHRAL RESECTION OF BLADDER TUMOR WITH MITOMYCIN-C N/A 12/09/2016   Procedure: CYSTOSCOPY TRANSURETHRAL RESECTION OF BLADDER TUMOR WITH MITOMYCIN-C;  Surgeon: Jethro Bolus, MD;  Location: [facility]  [place];  Service: Urology;  Laterality: N/A;   VARICOSE VEIN SURGERY  2002   VASECTOMY      Social History KIEFER OPHEIM  reports that he has never smoked. He has never used smokeless tobacco. He reports current alcohol use. He reports that he does not use drugs.  family history includes Heart attack in his paternal grandfather and paternal uncle; Heart attack (age of onset: 78) in his father; Heart attack (age of onset: 58) in his maternal uncle; Prostate cancer in his paternal uncle; Stroke (age of onset: 77) in his father.  Allergies  Allergen Reactions   Bee Pollen     Other reaction(s): Cough "sinus issues"   Pollen Extract Cough    "sinus issues"   Short Ragweed Pollen Ext Itching and Shortness Of Breath   Oxycodone Itching   Percocet [Oxycodone-Acetaminophen] Hives   Pine Tar Itching and Other (See Comments)    Sinus congestion   Sulfamethoxazole Nausea And Vomiting   Sulfamethoxazole-Trimethoprim Rash       PHYSICAL EXAMINATION: Vital signs: BP 110/80 (BP Location: Left Arm, Patient Position: Sitting, Cuff Size: Large)   Pulse 68   Ht 6' 2.5" (1.892 m)   Wt 250 lb (113.4 kg)   BMI 31.67 kg/m   Constitutional: generally well-appearing, no acute distress Psychiatric: alert and oriented x3, cooperative Eyes: Anicteric Mouth: oral pharynx moist, no lesions Abdomen: Not reexamined Extremities: no abnormalities on the visible extremities Skin: no relevant lesions on visible extremities Neuro: No focal deficits.   ASSESSMENT:  1.  Intermittent problems with loose stools.  Seemingly responding to metronidazole and Imodium. 2.  History of pancreatitis.  Resolved.  Normal MRI of the pancreas 3.  GERD.  Symptoms controlled on PPI 4.  History of adenomatous polyps.  Surveillance up-to-date 5.  Multiple medical problems   PLAN:  1.  He has metronidazole to be used on demand. 2.  Continue Imodium as needed 3.  Reflux precautions 4.  Continue  pantoprazole 5.  Consider colonoscopy 2027 6.  Interval follow-up as needed A total time of 30 minutes was spent preparing to see the patient, reviewing outside data, obtaining comprehensive medical history, performing medically appropriate physical exam, counseling and educating the patient regarding the above listed issues, and documenting clinical information in the health record.

## 2024-02-21 DIAGNOSIS — M48062 Spinal stenosis, lumbar region with neurogenic claudication: Secondary | ICD-10-CM | POA: Diagnosis not present

## 2024-02-21 DIAGNOSIS — M545 Low back pain, unspecified: Secondary | ICD-10-CM | POA: Diagnosis not present

## 2024-02-21 DIAGNOSIS — G8929 Other chronic pain: Secondary | ICD-10-CM | POA: Diagnosis not present

## 2024-02-21 DIAGNOSIS — M47816 Spondylosis without myelopathy or radiculopathy, lumbar region: Secondary | ICD-10-CM | POA: Diagnosis not present

## 2024-02-22 DIAGNOSIS — E291 Testicular hypofunction: Secondary | ICD-10-CM | POA: Diagnosis not present

## 2024-02-28 DIAGNOSIS — M4316 Spondylolisthesis, lumbar region: Secondary | ICD-10-CM | POA: Diagnosis not present

## 2024-02-28 DIAGNOSIS — M5116 Intervertebral disc disorders with radiculopathy, lumbar region: Secondary | ICD-10-CM | POA: Diagnosis not present

## 2024-02-28 DIAGNOSIS — M48062 Spinal stenosis, lumbar region with neurogenic claudication: Secondary | ICD-10-CM | POA: Diagnosis not present

## 2024-02-28 DIAGNOSIS — M4726 Other spondylosis with radiculopathy, lumbar region: Secondary | ICD-10-CM | POA: Diagnosis not present

## 2024-02-29 DIAGNOSIS — E291 Testicular hypofunction: Secondary | ICD-10-CM | POA: Diagnosis not present

## 2024-03-02 DIAGNOSIS — M47816 Spondylosis without myelopathy or radiculopathy, lumbar region: Secondary | ICD-10-CM | POA: Diagnosis not present

## 2024-03-02 DIAGNOSIS — M545 Low back pain, unspecified: Secondary | ICD-10-CM | POA: Diagnosis not present

## 2024-03-02 DIAGNOSIS — M48062 Spinal stenosis, lumbar region with neurogenic claudication: Secondary | ICD-10-CM | POA: Diagnosis not present

## 2024-03-02 DIAGNOSIS — G8929 Other chronic pain: Secondary | ICD-10-CM | POA: Diagnosis not present

## 2024-03-07 ENCOUNTER — Encounter: Payer: Self-pay | Admitting: Psychology

## 2024-03-07 DIAGNOSIS — E291 Testicular hypofunction: Secondary | ICD-10-CM | POA: Diagnosis not present

## 2024-03-07 NOTE — Progress Notes (Signed)
 Neuropsychological Evaluation   Patient:  Brian Cortez   DOB: June 22, 1951  MR Number: [id number]  Location: [facility] [address] [place], [address] [place] [address] Dept: [phone]  Start: 2 PM End: 3 PM  Provider/Observer:     Hershal Coria PsyD  Chief Complaint:      Chief Complaint  Patient presents with   Memory Loss   Sleeping Problem   12/15/2023 2 PM-3 PM: Today I provided feedback regarding the repeat neuropsychological assessment and the working diagnosis and diagnostic considerations with treatment recommendations made today.  I have included a copy of the reason for service and summary of the evaluation below for convenience.  Patient's EMR have all of the visits documented in the patient's most recent complete neuropsychological evaluative report can be found in his EMR dated 12/07/2023.   Reason For Service:     Brian Cortez is a 73 year old male that was initially referred for neuropsychological evaluation back in 2022 by his treating neurologist Melvyn Novas, MD due to reports of increasing difficulties with his memory and mild decrease worsening of screening/MoCA assessments over time.  All of my previous notes can be found in the patient's EMR and I have included primary information below from each of the visits dating back to the initial clinical interview.  The patient had initial formal neuropsychological testing in report produced 11/03/2021 and we repeated that clinical test battery on 08/23/2023.  In the test results section below both of these testing data sets are listed with appropriate date above for direct comparisons.  During the initial neuropsychological evaluation in 2022 there were indications of decline in overall/global intellectual and cognitive functioning there were consistent with the patient's subjective reports.  Expressive and  receptive language appeared to remain intact as well as information processing speed.  There were numerous issues of significant frustration and difficulty for the patient with the initial testing that are documented in the same types of frustrations were seen during the most recent evaluation.  There were greater memory deficits for visual versus auditory memory functions but the patient displayed significant improvements for both visual and auditory memory under cueing/recognition formats.  Diagnostic considerations at the time did suggest decrease in global functioning showing mild to moderate changes compared to historic/premorbid functioning level for addiction.  After the initial evaluation the data set did not allow to completely rule out the possibility of a progressive cortical dementia/major neurocognitive disorder but patterns were not consistent with Lewy body or cerebral vascular/microvascular ischemic disease type changes.  Repeat testing was scheduled and the patient was diagnosed with a mild cognitive disorder with memory loss.  Below each of these clinical interview visits are listed and reversed Chronicle order.  08/08/2023:  This was a follow-up visit with the patient. I had initially seen him back in 2022 for the initial neuropsychological evaluation, which can be found in the patient's EMR. The patient showed symptoms consistent with subjective reports of changes in memory function from premorbid status with the inability to rule out an Alzheimer's type process. Patient's neuropsychological evaluation was not consistent with Lewy body dementia or other types of progressive degenerative conditions. However, this was not definitive in nature during our first evaluation and repeat evaluation was planned. The patient returned in 2023 and at that time reported little to no change over the prior year and the patient continued with steady MoCA testing during neurological evaluations. We  decided at  that point to hold off. The patient returns today reporting that he has remained fairly stable as far as cognitive functioning but has noted some mild changes over the past 2 years. The patient reports that he has, however, continued to have significant pain difficulties with compression of his spine and had an acute onset of uncontrolled sweating that was challenging to figure out causative factors. Eventually, they reduced the amount of testosterone he was being given and the sweating appears to have stopped. Patient also had an episode of very painful pancreatitis that has gotten better. The patient denies significant changes in visual spatial functioning and he has continued to be active in his life. He notes some mild changes in memory over the past 2 years with continued differences from baseline. We have set the patient up for follow-up neuropsychological evaluation and we will repeat the test battery that was administered in 2022 for direct comparison purposes.   08/04/2022:  During this clinical visit, which was face-to-face clinical interview, the patient reports that he feels like he is doing better than on the previous neuropsychological evaluation.  He did offer information regarding the previous testing that he did not relate on the day of the testing.  Patient reports that 2 days before he was in the emergency department for 15 hours waiting to be seen for diverticulitis and was still very tired with lack of sleep due to this time.  He reports that he was concerned about delaying his visit in our office because of that and went ahead and went through with the testing.  This may have affected him to some degree.   The patient reports that his health has been much better with better blood pressure numbers and is now often below 120/70 with blood pressure readings and reports that his overall blood work has also improved.  The patient reports that he has reduced his alcohol intake considerably but  does continue to have some alcohol consumption.  He continues to be compliant with his CPAP machine.  Patient feels like his overall health has been better and he reports that he is doing better as far as memory.  Patient had improvement in his MoCA testing with Dr. Vickey Huger recently.  Patient reports that he had knee replacement surgery of his right knee at [facility] and reports improvement but continued to have some degree of pain.  The surgery was done in April.  The patient clearly feels like his memory is better than it was when we first initiated this process which would argue against any type of progressive dementia type process.  The patient continues to take testosterone which had been discontinued for some period of time but now has resumed.  The patient feels like the Aricept that he has been taking with prescription from Dr. Vickey Huger does help with his memory.  This may be related to some degrees of normal age-related microvascular ischemic changes.  He denies any side effects from this.   At this point, we are not going to do repeat testing and instead schedule a follow-up appointment in 1 year to assess for the need for repeat testing at that point.  The patient continues to remain stable and doing well with no indications of progression we may not do repeat testing even 1 year from now.  That determination will be made with a future appointment.  10/08/2021 Initial Clinical Interview and background (in EMR):  Brian Cortez is a 73 year old male referred by [facility]  Dohmeier, MD for neuropsychological evaluation as part of a larger neurological work-up.  The patient has been followed by Dr. Albertina Hugger for many years for obstructive sleep apnea and has been extremely compliant with his CPAP device over the years.  However, he has begun reporting increasing difficulties with his memory with first report to Dr. Albertina Hugger occurring in March of this year.  Recent MoCA cognitive testing has shown a decline  from previous assessments with particular concerns about difficulties on serial sevens and following multistep commands.  He did well on other aspects of the MoCA testing with the exception of new trouble with completing the Trail Making Test.  He has been started on he was started on Aricept daily that is well up to 2 mg a day.  Recent MRI was unremarkable.  During the clinical interview today, the patient reports that he continues to be very compliant with his CPAP use and feels like the addition of Aricept to his medication regimen has improved his memory functions as much is 50% improvement.  The patient reports that he began noticing some memory changes approximately 3 years ago and describes it as a gradual change over this time rather than stepwise changes with some recent improvement with Aricept.  The patient reports on rare occasions that he will have some difficulties with word finding but this is certainly not every day or once per week and frequency.  He denies any tremors or any visual hallucinations.  He also denies any geographic disorientation's or other changes in visual-spatial or visual processing changes.  The patient reports that he has had some stressors around a reoccurrence of his bladder cancer.  The patient was treated for bladder cancer and has been followed up and they recently found a reoccurrence which has been addressed.  He has had various interventions for his bladder cancer including specific targeted chemotherapy, BCG wash etc.  The patient reports that his mood is very positive and denies any symptoms of anxiety or depression.  The patient reports that he started taking Lexapro many years ago to address infrequent but significant panic events.  The patient reports that his perception and worries during these panic events had to do with health concerns as his father died of a heart attack i 67 and the patient's older brother had become anxious and obsessive with fears that his  older brother would die at the same age.  His older brother did not pass away at that time.  The patient reports that he would have fears that would be exacerbated by his brother's fears leading to some panic events.  The patient reports that he has had not had any side effects from the Lexapro and feels like it is quite helpful and keeping the panic events at bay.  The patient describes his only significant hospitalizations as having to do with his bladder cancer with 2 previous hospitalizations, a meniscus tear and upcoming right knee replacement planned in May 2023.  He reports that he is still having some small tumors develop in his bladder which are being addressed through oncology.  The patient acknowledges some worry with a reoccurrence of these tumors but does not feel like he is being overwhelmed with anxiety regarding them.  The patient reports that he has good sleep patterns with good CPAP compliance, that his appetite is good and there is no specific changes.  There is no significant history of progressive dementia is that the patient is aware of.  His father passed away  from a heart attack at 75 years of age and his mother passed away at 75 years of age.  The patient denies any tremors, visual hallucinations or significant changes in geographic orientation.  No other specific symptoms besides his subjective memory changes are noted.  The patient has a 66 year old daughter who has been diagnosed with ADD/OCD and anxiety and has a 33 year old daughter who is in good health with no psychiatric or neurological history.    Impression/Diagnosis:   The results of the current neuropsychological evaluation with direct comparisons to previous objective neuropsychological assessment and combined with available clinical information are very encouraging.  While the patient continues to perform below premorbid estimates globally and some areas individually are specifically below premorbid estimates overall,  the patient is stayed the same or improved over the past 2 years as far as objective assessment and subjective reports also identify improvements overall.  Expressive and receptive language, memory functions, information processing speed, visual-spatial and visual constructional capacity although stayed the same or improved over this time.  As far as diagnostic considerations, the patient would continue to meet a diagnostic criteria for mild cognitive decline/impairment with current and previous assessments being below predicted levels of premorbid functioning.  The patient is also continuing to report performances in various cognitive domains to be below his perceived premorbid functioning as well.  However, the likelihood of this representing some type of progressive degenerative major neurocognitive disorder is increasingly unlikely and the patient does not show patterns of cognitive strengths and weaknesses consistent with degenerative condition such as Alzheimer's, Lewy body, Parkinson's, etc.  As far as recommendations, the patient should continue with his diligent use of his CPAP device and continuing to work on good sleep hygiene.  Maintaining good diet will also be important as the patient has shown some age consistent microvascular changes.  The patient continues to respond well to his SSRI/Lexapro regimen without reported side effects.  It may be worth a within subject trial/ABAB trial around his prescription of Aricept.  The patient has been taking it for some time now and while he has reported he feels like it is helpful it may be worth a trial of discontinuing it for a month or so and noting any changes and then restarting it again and then a trial with again.  This will help the patient to determine whether Aricept is helping him with his memory overall more clearly.  In any event, the patient is not describing any side effects per se from his Aricept and does feel like it has helped his memory  some.  I will sit down with the patient and go over the results and the comparisons made above and address specific recommendations for the patient individually outside of medical recommendations.  Diagnosis:    Mild cognitive disorder  Memory deficits  Sleep apnea with use of continuous positive airway pressure (CPAP)   _____________________ Chapman Commodore, Psy.D. Clinical Neuropsychologist

## 2024-03-14 DIAGNOSIS — M5146 Schmorl's nodes, lumbar region: Secondary | ICD-10-CM | POA: Diagnosis not present

## 2024-03-14 DIAGNOSIS — M4726 Other spondylosis with radiculopathy, lumbar region: Secondary | ICD-10-CM | POA: Diagnosis not present

## 2024-03-14 DIAGNOSIS — E291 Testicular hypofunction: Secondary | ICD-10-CM | POA: Diagnosis not present

## 2024-03-14 DIAGNOSIS — M5459 Other low back pain: Secondary | ICD-10-CM | POA: Diagnosis not present

## 2024-03-21 DIAGNOSIS — E291 Testicular hypofunction: Secondary | ICD-10-CM | POA: Diagnosis not present

## 2024-03-26 ENCOUNTER — Other Ambulatory Visit: Payer: Self-pay | Admitting: Internal Medicine

## 2024-03-28 DIAGNOSIS — E291 Testicular hypofunction: Secondary | ICD-10-CM | POA: Diagnosis not present

## 2024-04-04 ENCOUNTER — Telehealth: Payer: Self-pay | Admitting: Neurology

## 2024-04-04 NOTE — Telephone Encounter (Signed)
 LVM and sent mychart msg informing pt of need to reschedule 05/16/24 appt - MD out   The beginning of June is held for reschedules for Dr. Albertina Hugger if patient calls back

## 2024-04-05 ENCOUNTER — Encounter: Payer: Self-pay | Admitting: Neurology

## 2024-04-17 ENCOUNTER — Telehealth: Payer: Self-pay | Admitting: Neurology

## 2024-04-17 NOTE — Telephone Encounter (Signed)
 Pt called to R/s appointment

## 2024-04-18 DIAGNOSIS — E291 Testicular hypofunction: Secondary | ICD-10-CM | POA: Diagnosis not present

## 2024-04-19 DIAGNOSIS — H3530 Unspecified macular degeneration: Secondary | ICD-10-CM | POA: Diagnosis not present

## 2024-04-19 DIAGNOSIS — H43811 Vitreous degeneration, right eye: Secondary | ICD-10-CM | POA: Diagnosis not present

## 2024-04-19 DIAGNOSIS — H353132 Nonexudative age-related macular degeneration, bilateral, intermediate dry stage: Secondary | ICD-10-CM | POA: Diagnosis not present

## 2024-04-19 DIAGNOSIS — H401130 Primary open-angle glaucoma, bilateral, stage unspecified: Secondary | ICD-10-CM | POA: Diagnosis not present

## 2024-04-19 DIAGNOSIS — Z9889 Other specified postprocedural states: Secondary | ICD-10-CM | POA: Diagnosis not present

## 2024-04-25 DIAGNOSIS — E291 Testicular hypofunction: Secondary | ICD-10-CM | POA: Diagnosis not present

## 2024-04-27 DIAGNOSIS — Z01818 Encounter for other preprocedural examination: Secondary | ICD-10-CM | POA: Diagnosis not present

## 2024-04-27 DIAGNOSIS — E7849 Other hyperlipidemia: Secondary | ICD-10-CM | POA: Diagnosis not present

## 2024-04-27 DIAGNOSIS — N189 Chronic kidney disease, unspecified: Secondary | ICD-10-CM | POA: Diagnosis not present

## 2024-04-27 DIAGNOSIS — E119 Type 2 diabetes mellitus without complications: Secondary | ICD-10-CM | POA: Diagnosis not present

## 2024-04-27 DIAGNOSIS — I129 Hypertensive chronic kidney disease with stage 1 through stage 4 chronic kidney disease, or unspecified chronic kidney disease: Secondary | ICD-10-CM | POA: Diagnosis not present

## 2024-04-27 DIAGNOSIS — G5733 Lesion of lateral popliteal nerve, bilateral lower limbs: Secondary | ICD-10-CM | POA: Diagnosis not present

## 2024-04-27 DIAGNOSIS — I429 Cardiomyopathy, unspecified: Secondary | ICD-10-CM | POA: Diagnosis not present

## 2024-04-28 DIAGNOSIS — I444 Left anterior fascicular block: Secondary | ICD-10-CM | POA: Diagnosis not present

## 2024-05-02 DIAGNOSIS — E291 Testicular hypofunction: Secondary | ICD-10-CM | POA: Diagnosis not present

## 2024-05-07 ENCOUNTER — Encounter: Payer: Self-pay | Admitting: *Deleted

## 2024-05-08 ENCOUNTER — Ambulatory Visit (HOSPITAL_BASED_OUTPATIENT_CLINIC_OR_DEPARTMENT_OTHER): Attending: Neurosurgery | Admitting: Physical Therapy

## 2024-05-08 ENCOUNTER — Other Ambulatory Visit: Payer: Self-pay

## 2024-05-08 ENCOUNTER — Encounter (HOSPITAL_BASED_OUTPATIENT_CLINIC_OR_DEPARTMENT_OTHER): Payer: Self-pay | Admitting: Physical Therapy

## 2024-05-08 DIAGNOSIS — M5459 Other low back pain: Secondary | ICD-10-CM | POA: Insufficient documentation

## 2024-05-08 NOTE — Therapy (Signed)
 OUTPATIENT PHYSICAL THERAPY EVALUATION   Patient Name: Brian Cortez MRN: [id number] DOB:03-27-1951, 73 y.o., male Today's Date: 05/08/2024  END OF SESSION:  PT End of Session - 05/08/24 1758     Visit Number 1    Number of Visits 7    Date for PT Re-Evaluation 06/23/24    Authorization Type [employer] MCR    Progress Note Due on Visit 10    PT Start Time 1408    PT Stop Time 1450    PT Time Calculation (min) 42 min    Activity Tolerance Patient tolerated treatment well    Behavior During Therapy WFL for tasks assessed/performed          Past Medical History:  Diagnosis Date   Acute pancreatitis    Anxiety    Bladder tumor    Carpal tunnel syndrome, left    intermittant   Chronic seasonal allergic rhinitis    Coronary artery disease    Coronary calcium  score is very high at 1582   DDD (degenerative disc disease), cervical    Diabetes mellitus type 2 in nonobese (HCC)    Dilated cardiomyopathy (HCC)    Diverticulosis of colon    Fecal incontinence    GERD (gastroesophageal reflux disease)    Grade II diastolic dysfunction 06/17/2017   per ECHO    Hemorrhoids    Hiatal hernia    History of adenomatous polyp of colon    2001;  2007;  2012   History of bladder cancer urologist-  dr Isla Mari   11-06-2013  s/p TURBT and BCG tx's   History of kidney stones    about 45 years ago   History of panic attacks    History of urethral stricture    Hyperlipidemia    Hypertension    Hypogonadism in male    LVH (left ventricular hypertrophy) 06/17/2017   Mild, noted on ECHO   Nonischemic cardiomyopathy [facility])    cardiologist-  dr Mitzie Anda-- per last echo 01/ 2016 ef 50-55%  (up from 09/ 2014 ef 45-50%)   OSA on CPAP    since 2014   Pneumonia 11/2010   Left lower lobe   Pulmonary hypertension (HCC) 06/17/2017   Mild, per ECHO   Past Surgical History:  Procedure Laterality Date   CARDIOVASCULAR STRESS TEST  08-21-2013  dr Mitzie Anda   normal nuclear study w/ no ischemia/   normal LV function and wall motion , ef 63%   CATARACT EXTRACTION W/ INTRAOCULAR LENS  IMPLANT, BILATERAL  2013   COLONOSCOPY  last one 03-03-2016   CYSTO/  DIRECT VISION INTERNAL URETEROTOMY  11/25/2006   LAPAROSCOPIC CHOLECYSTECTOMY  10/15/2006   MENISCUS REPAIR Right 12/2020   ORBITAL FRACTURE SURGERY  1986   left eye:  Jan 1986--- fixation of fractures, repair of optic nerve decompression , and complex closure   TONSILLECTOMY  child   TRANSTHORACIC ECHOCARDIOGRAM  12-16-2014   dr Mitzie Anda   grade 1 diastolic dysfunction, ef 50-55% (improved from last study in 2014 , previous ef 45-50%) /  trivial MR and TR/  mild LAE/  inferior vena cava dilated, respirophasic diameter changes were blunted (<50%), consistant w/ elevated central venous pressure   TRANSURETHRAL RESECTION OF BLADDER TUMOR Bilateral 05/16/2018   Procedure: TRANSURETHRAL RESECTION OF BLADDER TUMOR (TURBT)/ RETROGRADE;  Surgeon: Christina Coyer, MD;  Location: [facility];  Service: Urology;  Laterality: Bilateral;   TRANSURETHRAL RESECTION OF BLADDER TUMOR WITH GYRUS (TURBT-GYRUS) N/A 11/06/2013   Procedure: TRANSURETHRAL RESECTION OF  BLADDER TUMOR WITH GYRUS (TURBT-GYRUS);  Surgeon: Edmund Gouge, MD;  Location: [facility];  Service: Urology;  Laterality: N/A;   TRANSURETHRAL RESECTION OF BLADDER TUMOR WITH MITOMYCIN -C N/A 12/09/2016   Procedure: CYSTOSCOPY TRANSURETHRAL RESECTION OF BLADDER TUMOR WITH MITOMYCIN -C;  Surgeon: Annamarie Kid, MD;  Location: [facility] [place];  Service: Urology;  Laterality: N/A;   VARICOSE VEIN SURGERY  2002   VASECTOMY     Patient Active Problem List   Diagnosis Date Noted   MCI (mild cognitive impairment) 05/16/2023   Primary open-angle glaucoma (POAG), moderate stage 05/13/2022   History of vitrectomy 04/08/2022   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   Chronic kidney disease, stage 3b (HCC) 05/15/2021   COVID-19 05/15/2021    Mild cognitive disorder 05/15/2021   Bilateral impacted cerumen 02/13/2021   Deviated septum 02/13/2021   Epistaxis 02/13/2021   Myopic macular degeneration 04/02/2020   Posterior vitreous detachment of right eye 04/02/2020   Early stage nonexudative age-related macular degeneration of both eyes 04/02/2020   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 04/02/2020   Low back pain 03/04/2020   Other specified local infections of the skin and subcutaneous tissue 08/20/2019   Secondary polycythemia 08/06/2019   Atherosclerosis of coronary artery 02/07/2019   Tinea cruris 10/16/2018   Neoplastic malignant related fatigue 09/20/2018   Convalescence after chemotherapy 09/20/2018   Anterior urethral stricture 08/04/2018   Bladder trabeculation 08/04/2018   Diverticula, bladder acquired 08/04/2018   Erectile dysfunction due to diseases classified elsewhere 08/04/2018   Low testosterone  in male 08/04/2018   Primary open angle glaucoma (POAG) of both eyes, moderate stage 02/10/2018   Obesity with body mass index greater than 30 09/14/2017   Incontinence of feces 04/22/2017   Pain in right knee 01/25/2017   Nasal congestion with rhinorrhea 09/08/2016   Chronic seasonal allergic rhinitis due to pollen 09/08/2016   Impaired fasting glucose 12/10/2015   Postherpetic neuralgia 11/12/2015   Herpes zoster without complication 11/04/2015   Acute recurrent ethmoidal sinusitis 09/09/2015   Bronchitis 09/09/2015   OSA on CPAP 09/09/2015   Rhinitis due to pollen 09/09/2015   Plantar fascial fibromatosis 11/25/2014   CAP (community acquired pneumonia) 10/15/2014   Allergic rhinitis 10/15/2014   Atelectasis 09/30/2014   Abnormal weight loss 09/06/2014   Cough 09/06/2014   CPAP rhinitis 08/27/2014   Anxiety disorder 03/15/2014   Colorectal polyps 03/15/2014   Glaucoma 03/15/2014   Screening for depression 03/15/2014   Male hypogonadism 03/15/2014   Other ill-defined heart diseases 03/15/2014    Bladder cancer (HCC) 11/06/2013   Sleep apnea with use of continuous positive airway pressure (CPAP) 08/23/2013   Coronary artery calcification seen on CAT scan 08/17/2013   Cardiomyopathy (HCC) 08/17/2013   Diastolic dysfunction 08/07/2013   Testosterone  deficiency 10/18/2012   CARPAL TUNNEL SYNDROME, LEFT 03/12/2010   Brachial neuritis or radiculitis 03/12/2010   NONSPECIFIC ABNORMAL ELECTROCARDIOGRAM 10/08/2009   ADENOMATOUS COLONIC POLYP 04/06/2008   Diverticulosis of colon 04/06/2008   Diaphragmatic hernia 12/18/2007   HYPERLIPIDEMIA 10/20/2007   Essential hypertension 05/30/2007   G E R D 05/30/2007   Other specified abnormal findings of blood chemistry 12/19/2006     REFERRING PROVIDER:  Sundra Engel, PA-C     REFERRING DIAG:  M54.59 (ICD-10-CM) - Other low back pain  M54.16 (ICD-10-CM) - Radiculopathy, lumbar region  M48.062 (ICD-10-CM) - Spinal stenosis, lumbar region with neurogenic claudication  M47.816 (ICD-10-CM) - Spondylosis without myelopathy or radiculopathy, lumbar region  Rationale for Evaluation and Treatment: Rehabilitation  THERAPY DIAG:  Other low back pain  ONSET DATE: July 2024   SUBJECTIVE:                                                                                                                                                                                           SUBJECTIVE STATEMENT: July 2024 had pancreatitis and weakness in L2-3, 4-5. Went to [employer] [facility] where I have done shots in the past & suggested MRI. Referred to neurosurgeon, saw dr Larrie Po. Referred to pain MD- 4 shots twice, first time worked for 2.5 hr and didn't work the second time. Did ablation but with no change. Provided with anti-inflammatories. Tried acupuncture, 4 treatments, started feeling some relief after 3rd & 4th. Chipped 15-18 golf balls, tried full swings with little twinge, that evening was cooking for church and back started to hurt. Next morning  I was in terrible pain, could hardly walk to the bathroom and back. Saw Dr Merlyn Starring at [facility] who wanted an updated MRI- showed L2-3 deterioration moreso than 4-5. Went to [place], took prednisone which helped until the end of the Rx so started hydrocodone - seemed to do ok. Ran out, woke up Sunday AM and was in terrible pain. Moved to oxycodone , has been on it for 10 days, very little cut of pain. A week ago, tomorrow, Raina Bunting denied sx bc of lack of 6 wk of preop PT.  Was playing golf, reformer pilates 3d/wk, 1/2 hr Trx with a trainer and 45 min on Fridays. Has stopped due to back pain.  H/o bladder CAx2. Currently taking 600 mg gabapentin TID and oxy 1 every 4 hr.  Get up bw 530-630, pain 8-8.5/10. Rt side pain wraps around lateral hip and up to iliac crest. Laying on either side hurts. Denies pain into legs. Lifting weight can create pain on lateral right hip to iliac crest.   PERTINENT HISTORY:  CKD, h/o bladder CA  PAIN:  Are you having pain? Yes: NPRS scale: mild right now Pain location: lower back to Rt lateral hip, proximal to Gr trochanter to iliac crest Pain description: severe  Aggravating factors: laying on either side, AM pain, lifting items, flexion to floor Relieving factors: pain meds a little  PRECAUTIONS:  None  RED FLAGS: None   WEIGHT BEARING RESTRICTIONS:  No  FALLS:  Has patient fallen in last 6 months? No  OCCUPATION:  retired  PLOF:  Independent  PATIENT GOALS:  Back to golf, travel, decrease pain   OBJECTIVE:  Note: Objective measures were completed at Evaluation unless otherwise noted.  DIAGNOSTIC FINDINGS:  CT Lumbar 05/30/23:  FINDINGS: Segmentation: 5 lumbar type vertebrae.   Alignment: Normal.   Vertebrae: Inferior endplate Schmorl's nodes at L2 and L3 are unchanged from prior. New superior endplate Schmorl's node at L3. No acute fracture. No suspicious bone lesion.   Paraspinal and other soft tissues: No paraspinal inflammatory changes.   Disc  levels: Mild disc height loss at L2-3 and L3-4. Mild multilevel facet arthropathy.   IMPRESSION: 1. No acute fracture or traumatic malalignment of the lumbar spine. 2. Mild degenerative changes of the lumbar spine.    PATIENT SURVEYS:  Modified Oswestry 27/50= 54%   COGNITIVE STATUS: Per MD note 12/15/23: diagnosed with a mild cognitive disorder with memory loss    SENSATION: WFL  POSTURE:  rounded shoulders, decreased lumbar lordosis, and increased thoracic kyphosis, posterior pelvic tilt, lacking full knee extension bilat in standing <10 deg   Body Part #1 Lumbar  PALPATION: Eval: no recreation of concordant pain with central and lateral PA or SIJ PA mobs  Tightness in Rt lumbar paraspinals, QL and glut med   EVAL ROM: no distal symptoms with movement, some midline discomfort with extension, Full ROM available   Notable lack of hip extension in Rt hip during golf follow through- concordant pain in hip recreated  EVAL MMT- Rt hip flexion & abd 5/5                                                                                                                             TREATMENT DATE:   EVAL 05/08/24: Discussed return to pilates   PATIENT EDUCATION:  Education details: Anatomy of condition, POC, HEP, exercise form/rationale Person educated: Patient Education method: Explanation, Demonstration, Tactile cues, and Verbal cues Education comprehension: verbalized understanding, returned demonstration, verbal cues required, tactile cues required, and needs further education  HOME EXERCISE PROGRAM: Return to Land class, gentle AM stretches & glut set to stand out of bed   ASSESSMENT:  CLINICAL IMPRESSION: Patient is a 73 y.o. M who was seen today for physical therapy evaluation and treatment for chronic, worsening low back pain that wraps to Rt lateral hip. Pt does present with tightness in glutes and TFL. Was unable to recreate concordant pain with SLR test or  joint mobilizations. Pt plans to return to neurosurgeon for surgery on lumbar spine at L2-3 but insurance is requiring PT. Pt will return to pilates reformer classes in a gentle manner as he has been doing it for a long time. Will also try gentle mobility exercises to reduce the severe AM pain.     OBJECTIVE IMPAIRMENTS: decreased activity tolerance, increased muscle spasms, impaired flexibility, postural dysfunction, and pain.   ACTIVITY LIMITATIONS: lifting, bending, sitting, sleeping, and recreation/fitness  PARTICIPATION LIMITATIONS: meal prep, cleaning, laundry, and community activity  PERSONAL FACTORS: see PMH are also affecting patient's functional outcome.   REHAB POTENTIAL: Fair planning surgery  CLINICAL DECISION MAKING: Evolving/moderate complexity  EVALUATION COMPLEXITY: Moderate   GOALS: Goals reviewed with patient? Yes  SHORT TERM GOALS: Target date: 6/28  Verbalize compliance with AM stretches and mobility Baseline: discussed at eval Goal status: INITIAL    LONG TERM GOALS: Target date:  POC date  Performing pilates exercises without increased pain Baseline: plans to try returning tomorrow at eval Goal status: INITIAL  2.  Improve flexibility to allow full knee extension bilaterally in standing Baseline: lacking at eval Goal status: INITIAL  3.  Verbalize reduction in AM spasm Baseline: 9.5/10 at eval in the mornings Goal status: INITIAL     PLAN:  PT FREQUENCY: 1x/week  PT DURATION: 6 weeks  PLANNED INTERVENTIONS: [id number]- PT Re-evaluation, 97750- Physical Performance Testing, 97110-Therapeutic exercises, 97530- Therapeutic activity, 97112- Neuromuscular re-education, 97535- Self Care, 62952- Manual therapy, [id number]- Gait training, [phone]- Aquatic Therapy, [phone] (1-2 muscles), [phone] (3+ muscles)- Dry Needling, Patient/Family education, Balance training, Stair training, Taping, Joint mobilization, Spinal mobilization, Cryotherapy, and Moist heat.  PLAN  FOR NEXT SESSION: continue STM, possible TPDN- to discuss with pt, how did pilates go? AM stretches?   Keven Pel, PT, DPT 05/08/2024, 6:21 PM

## 2024-05-09 DIAGNOSIS — E291 Testicular hypofunction: Secondary | ICD-10-CM | POA: Diagnosis not present

## 2024-05-14 ENCOUNTER — Other Ambulatory Visit: Payer: Self-pay | Admitting: Neurology

## 2024-05-15 DIAGNOSIS — E291 Testicular hypofunction: Secondary | ICD-10-CM | POA: Diagnosis not present

## 2024-05-16 ENCOUNTER — Ambulatory Visit: Payer: PPO | Admitting: Neurology

## 2024-05-17 ENCOUNTER — Ambulatory Visit: Payer: Medicare HMO | Admitting: Psychology

## 2024-05-23 ENCOUNTER — Encounter (HOSPITAL_BASED_OUTPATIENT_CLINIC_OR_DEPARTMENT_OTHER): Payer: Self-pay | Admitting: Physical Therapy

## 2024-05-23 DIAGNOSIS — E291 Testicular hypofunction: Secondary | ICD-10-CM | POA: Diagnosis not present

## 2024-05-30 ENCOUNTER — Ambulatory Visit (HOSPITAL_BASED_OUTPATIENT_CLINIC_OR_DEPARTMENT_OTHER): Admitting: Physical Therapy

## 2024-05-30 DIAGNOSIS — E291 Testicular hypofunction: Secondary | ICD-10-CM | POA: Diagnosis not present

## 2024-05-31 DIAGNOSIS — G4733 Obstructive sleep apnea (adult) (pediatric): Secondary | ICD-10-CM | POA: Diagnosis not present

## 2024-05-31 DIAGNOSIS — M5442 Lumbago with sciatica, left side: Secondary | ICD-10-CM | POA: Diagnosis not present

## 2024-05-31 DIAGNOSIS — I428 Other cardiomyopathies: Secondary | ICD-10-CM | POA: Diagnosis not present

## 2024-05-31 DIAGNOSIS — Z01818 Encounter for other preprocedural examination: Secondary | ICD-10-CM | POA: Diagnosis not present

## 2024-05-31 DIAGNOSIS — N189 Chronic kidney disease, unspecified: Secondary | ICD-10-CM | POA: Diagnosis not present

## 2024-05-31 DIAGNOSIS — M5441 Lumbago with sciatica, right side: Secondary | ICD-10-CM | POA: Diagnosis not present

## 2024-05-31 DIAGNOSIS — E119 Type 2 diabetes mellitus without complications: Secondary | ICD-10-CM | POA: Diagnosis not present

## 2024-06-04 DIAGNOSIS — H401132 Primary open-angle glaucoma, bilateral, moderate stage: Secondary | ICD-10-CM | POA: Diagnosis not present

## 2024-06-06 DIAGNOSIS — E291 Testicular hypofunction: Secondary | ICD-10-CM | POA: Diagnosis not present

## 2024-06-07 ENCOUNTER — Encounter (HOSPITAL_BASED_OUTPATIENT_CLINIC_OR_DEPARTMENT_OTHER): Admitting: Physical Therapy

## 2024-06-07 DIAGNOSIS — M5441 Lumbago with sciatica, right side: Secondary | ICD-10-CM | POA: Diagnosis not present

## 2024-06-07 DIAGNOSIS — M5442 Lumbago with sciatica, left side: Secondary | ICD-10-CM | POA: Diagnosis not present

## 2024-06-07 DIAGNOSIS — M48062 Spinal stenosis, lumbar region with neurogenic claudication: Secondary | ICD-10-CM | POA: Diagnosis not present

## 2024-06-07 DIAGNOSIS — M549 Dorsalgia, unspecified: Secondary | ICD-10-CM | POA: Diagnosis not present

## 2024-06-08 DIAGNOSIS — M5442 Lumbago with sciatica, left side: Secondary | ICD-10-CM | POA: Diagnosis not present

## 2024-06-08 DIAGNOSIS — Z9889 Other specified postprocedural states: Secondary | ICD-10-CM | POA: Diagnosis not present

## 2024-06-08 DIAGNOSIS — M5441 Lumbago with sciatica, right side: Secondary | ICD-10-CM | POA: Diagnosis not present

## 2024-06-08 DIAGNOSIS — Z981 Arthrodesis status: Secondary | ICD-10-CM | POA: Diagnosis not present

## 2024-06-11 DIAGNOSIS — M4326 Fusion of spine, lumbar region: Secondary | ICD-10-CM | POA: Diagnosis not present

## 2024-06-13 ENCOUNTER — Ambulatory Visit (HOSPITAL_BASED_OUTPATIENT_CLINIC_OR_DEPARTMENT_OTHER): Admitting: Physical Therapy

## 2024-06-19 ENCOUNTER — Encounter (HOSPITAL_BASED_OUTPATIENT_CLINIC_OR_DEPARTMENT_OTHER): Admitting: Physical Therapy

## 2024-06-20 DIAGNOSIS — L821 Other seborrheic keratosis: Secondary | ICD-10-CM | POA: Diagnosis not present

## 2024-06-20 DIAGNOSIS — L812 Freckles: Secondary | ICD-10-CM | POA: Diagnosis not present

## 2024-06-20 DIAGNOSIS — L570 Actinic keratosis: Secondary | ICD-10-CM | POA: Diagnosis not present

## 2024-06-20 DIAGNOSIS — D1801 Hemangioma of skin and subcutaneous tissue: Secondary | ICD-10-CM | POA: Diagnosis not present

## 2024-06-22 DIAGNOSIS — H52203 Unspecified astigmatism, bilateral: Secondary | ICD-10-CM | POA: Diagnosis not present

## 2024-06-22 DIAGNOSIS — H353132 Nonexudative age-related macular degeneration, bilateral, intermediate dry stage: Secondary | ICD-10-CM | POA: Diagnosis not present

## 2024-06-22 DIAGNOSIS — H524 Presbyopia: Secondary | ICD-10-CM | POA: Diagnosis not present

## 2024-06-22 DIAGNOSIS — Z961 Presence of intraocular lens: Secondary | ICD-10-CM | POA: Diagnosis not present

## 2024-06-22 DIAGNOSIS — H401131 Primary open-angle glaucoma, bilateral, mild stage: Secondary | ICD-10-CM | POA: Diagnosis not present

## 2024-06-25 ENCOUNTER — Encounter (HOSPITAL_BASED_OUTPATIENT_CLINIC_OR_DEPARTMENT_OTHER): Admitting: Physical Therapy

## 2024-06-27 DIAGNOSIS — E291 Testicular hypofunction: Secondary | ICD-10-CM | POA: Diagnosis not present

## 2024-06-28 ENCOUNTER — Encounter: Payer: Self-pay | Admitting: Neurology

## 2024-06-28 ENCOUNTER — Ambulatory Visit: Admitting: Neurology

## 2024-06-28 VITALS — BP 126/82 | HR 64 | Resp 15 | Ht 75.0 in | Wt 244.8 lb

## 2024-06-28 DIAGNOSIS — I422 Other hypertrophic cardiomyopathy: Secondary | ICD-10-CM

## 2024-06-28 DIAGNOSIS — G4730 Sleep apnea, unspecified: Secondary | ICD-10-CM

## 2024-06-28 DIAGNOSIS — I5189 Other ill-defined heart diseases: Secondary | ICD-10-CM

## 2024-06-28 NOTE — Patient Instructions (Signed)
 HISTORY OF PRESENT ILLNESS:  Brian Cortez is a 73 y.o. male patient who is  a sleep patient here for revisit 06/28/2024. SABRA  Chief concern according to patient :  I lost weight, had a surgery 21 days ago, (L2-3 fusion surgery by Dr Tanda, MATTHEW) . Pain has improved , right flank to hip. I am limited  with driving longer distances, I am restrcied to carry 10 pounds limit. I can walk stairs  Sleeping well , needs a new CPAP.    Dr Corina has given him a clean bill of cognitive function.   Bladder cancer survivor.    OSA on CPAP .     CPAP was not newly  set up after the pandemic, this machine is over 81 years old.    He is happy to comply with CPAP need retesting by HST.

## 2024-06-28 NOTE — Progress Notes (Signed)
 Provider:  Dedra Gores, MD  Primary Care Physician:  Janey Santos, MD [address] [place] [address]     Referring Provider: Janey Santos, Md [address] [place],  [place] [address]          Chief Complaint according to patient   Patient presents with:                HISTORY OF PRESENT ILLNESS:  Brian Cortez is a 73 y.o. male patient who is  a sleep patient here for revisit 06/28/2024. SABRA  Chief concern according to patient :  I lost weight, had a surgery 21 days ago, (L2-3 fusion surgery by Dr Tanda, MATTHEW) . Pain has improved , right flank to hip. I am limited  with driving longer distances, I am restrcied to carry 10 pounds limit. I can walk stairs  Sleeping well , needs a new CPAP.   DR Corina has given him a clean bill of cognitive function.  Bladder cancer survivor.   OSA on CPAP .   CPAP was not newly  set up after the pandemic, this machine is over 32 years old.   He is happy to comply with CPAP retesting.      Review of Systems: Out of a complete 14 system review, the patient complains of only the following symptoms, and all other reviewed systems are negative.:   SLEEPINESS ?  How likely are you to doze in the following situations: 0 = not likely, 1 = slight chance, 2 = moderate chance, 3 = high chance  Sitting and Reading? Watching Television? Sitting inactive in a public place (theater or meeting)? Lying down in the afternoon when circumstances permit? Sitting and talking to someone? Sitting quietly after lunch without alcohol ? In a car, while stopped for a few minutes in traffic? As a passenger in a car for an hour without a break?  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    Caffeine Use: 3-4 cups   Social Drivers of Corporate investment banker Strain: Not on file  Food Insecurity: Low Risk  (06/21/2024)   Received from [facility]   Hunger Vital Sign    Within the past 12 months, you worried that your food would run out before you got money to buy more: Never true    Within the past 12 months, the food you bought just didn't last and you didn't have money to get more. : Never true  Transportation Needs: No Transportation Needs (06/21/2024)   Received from [facility]  In the past 12 months, has lack of reliable transportation kept you from medical appointments, meetings, work or from getting things needed for daily living? : No  Physical Activity: Not on file  Stress: Not on file  Social Connections: Not on file    Family History  Problem Relation Age of Onset   Heart attack Father 10   Stroke Father 11   Heart attack Paternal Grandfather        in 23s   Prostate cancer Paternal Uncle    Heart attack Paternal Uncle         > 55   Heart attack Maternal Uncle 80       > 55   Colon cancer Neg Hx    Diabetes Neg Hx     Past Medical History:  Diagnosis Date   Acute pancreatitis    Anxiety    Bladder tumor    Carpal tunnel syndrome, left    intermittant   Chronic seasonal allergic rhinitis    Coronary artery disease    Coronary calcium  score is very high at 1582   DDD (degenerative disc disease), cervical    Diabetes mellitus type 2 in nonobese (HCC)    Dilated  cardiomyopathy (HCC)    Diverticulosis of colon    Fecal incontinence    GERD (gastroesophageal reflux disease)    Grade II diastolic dysfunction 06/17/2017   per ECHO    Hemorrhoids    Hiatal hernia    History of adenomatous polyp of colon    2001;  2007;  2012   History of bladder cancer urologist-  dr chales   11-06-2013  s/p TURBT and BCG tx's   History of kidney stones    about 45 years ago   History of panic attacks    History of urethral stricture    Hyperlipidemia    Hypertension    Hypogonadism in male    LVH (left ventricular hypertrophy) 06/17/2017   Mild, noted on ECHO   Nonischemic cardiomyopathy [facility])    cardiologist-  dr rolan-- per last echo 01/ 2016 ef 50-55%  (up from 09/ 2014 ef 45-50%)   OSA on CPAP    since 2014   Pneumonia 11/2010   Left lower lobe   Pulmonary hypertension (HCC) 06/17/2017   Mild, per ECHO    Past Surgical History:  Procedure Laterality Date   CARDIOVASCULAR STRESS TEST  08-21-2013  dr rolan   normal nuclear study w/ no ischemia/  normal LV function and wall motion , ef 63%   CATARACT EXTRACTION W/ INTRAOCULAR LENS  IMPLANT, BILATERAL  2013   COLONOSCOPY  last one 03-03-2016   CYSTO/  DIRECT VISION INTERNAL URETEROTOMY  11/25/2006   LAPAROSCOPIC CHOLECYSTECTOMY  10/15/2006   MENISCUS REPAIR Right 12/2020   ORBITAL FRACTURE SURGERY  1986   left eye:  Jan 1986--- fixation of fractures, repair of optic nerve decompression , and complex closure   TONSILLECTOMY  child   TRANSTHORACIC ECHOCARDIOGRAM  12-16-2014   dr rolan   grade 1 diastolic dysfunction, ef 50-55% (improved from last study in 2014 , previous ef 45-50%) /  trivial MR and TR/  mild LAE/  inferior vena cava dilated, respirophasic diameter changes were blunted (<50%), consistant w/ elevated central venous pressure   TRANSURETHRAL RESECTION OF BLADDER TUMOR Bilateral 05/16/2018   Procedure: TRANSURETHRAL RESECTION OF BLADDER TUMOR (TURBT)/ RETROGRADE;  Surgeon:  Nieves Cough, MD;  Location: [facility] [place];  Service: Urology;  Laterality: Bilateral;  TRANSURETHRAL RESECTION OF BLADDER TUMOR WITH GYRUS (TURBT-GYRUS) N/A 11/06/2013   Procedure: TRANSURETHRAL RESECTION OF BLADDER TUMOR WITH GYRUS (TURBT-GYRUS);  Surgeon: Arlena LILLETTE Gal, MD;  Location: [facility];  Service: Urology;  Laterality: N/A;   TRANSURETHRAL RESECTION OF BLADDER TUMOR WITH MITOMYCIN -C N/A 12/09/2016   Procedure: CYSTOSCOPY TRANSURETHRAL RESECTION OF BLADDER TUMOR WITH MITOMYCIN -C;  Surgeon: Arlena Gal, MD;  Location: [facility] [place];  Service: Urology;  Laterality: N/A;   VARICOSE VEIN SURGERY  2002   VASECTOMY       Current Outpatient Medications on File Prior to Visit  Medication Sig Dispense Refill   allopurinol (ZYLOPRIM) 300 MG tablet Take 300 mg by mouth daily.     aspirin  81 MG EC tablet Take 1 tablet by mouth daily.     bimatoprost (LUMIGAN) 0.01 % SOLN INSTILL ONE DROP IN EACH EYE AT BEDTIME     Continuous Glucose Sensor (DEXCOM G7 SENSOR) MISC Place 1 each onto the skin every 10 days. 3 each 0   donepezil  (ARICEPT  ODT) 10 MG disintegrating tablet TAKE ONE TABLET AT BEDTIME 90 tablet 3   escitalopram  (LEXAPRO ) 20 MG tablet Take 20 mg by mouth every morning.      FARXIGA 10 MG TABS tablet Take 10 mg by mouth daily.     fenofibrate  160 MG tablet Take 1 tablet (160 mg total) by mouth daily. 90 tablet 3   fexofenadine (ALLEGRA) 180 MG tablet Take 1 tablet by mouth as needed.     icosapent  Ethyl (VASCEPA ) 1 g capsule Take 2 capsules (2 g total) by mouth 2 (two) times daily. 360 capsule 3   Multiple Vitamins-Minerals (PRESERVISION AREDS 2 PO) Take 1 capsule by mouth 2 (two) times daily.      pantoprazole  (PROTONIX ) 40 MG tablet TAKE ONE TABLET DAILY 90 tablet 1   rosuvastatin  (CRESTOR ) 40 MG tablet TAKE ONE TABLET BY MOUTH ONCE DAILY 90 tablet 3   testosterone  cypionate (DEPOTESTOSTERONE CYPIONATE) 200 MG/ML injection Inject 0.5 mg into  the muscle.     timolol (TIMOPTIC) 0.5 % ophthalmic solution Place 1 drop into both eyes daily.     No current facility-administered medications on file prior to visit.    Allergies  Allergen Reactions   Bee Pollen     Other reaction(s): Cough sinus issues   Pollen Extract Cough    sinus issues   Short Ragweed Pollen Ext Itching and Shortness Of Breath   Oxycodone  Itching   Percocet [Oxycodone -Acetaminophen ] Hives   Pine Tar Itching and Other (See Comments)    Sinus congestion   Sulfamethoxazole Nausea And Vomiting, Hives and Nausea Only   Sulfamethoxazole-Trimethoprim  Rash     DIAGNOSTIC DATA (LABS, IMAGING, TESTING) - I reviewed patient records, labs, notes, testing and imaging myself where available.  Lab Results  Component Value Date   WBC 9.5 05/30/2023   HGB 17.4 (H) 05/30/2023   HCT 51.3 05/30/2023   MCV 93.3 05/30/2023   PLT 197 05/30/2023      Component Value Date/Time   NA 134 (L) 05/30/2023 1114   NA 138 01/26/2021 1428   K 4.6 05/30/2023 1114   CL 98 05/30/2023 1114   CO2 28 05/30/2023 1114   GLUCOSE 133 (H) 05/30/2023 1114   BUN 29 (H) 05/30/2023 1114   BUN 20 01/26/2021 1428   CREATININE 1.31 (H) 05/30/2023 1114   CALCIUM  9.8 05/30/2023 1114   PROT 7.1 05/30/2023 1114   PROT 7.1 01/26/2021 1428   ALBUMIN 4.6 05/30/2023 1114  ALBUMIN 4.7 01/26/2021 1428   AST 34 05/30/2023 1114   ALT 34 05/30/2023 1114   ALKPHOS 42 05/30/2023 1114   BILITOT 0.8 05/30/2023 1114   BILITOT 0.8 01/26/2021 1428   GFRNONAA 58 (L) 05/30/2023 1114   GFRAA 75 (L) 11/06/2013 1515   Lab Results  Component Value Date   CHOL 138 02/25/2023   HDL 40 02/25/2023   LDLCALC 60 02/25/2023   LDLDIRECT 67.7 09/11/2014   TRIG 235 (H) 02/25/2023   CHOLHDL 3.5 02/25/2023   Lab Results  Component Value Date   HGBA1C 5.7 01/30/2010   No results found for: VITAMINB12 Lab Results  Component Value Date   TSH 2.918 08/07/2013    PHYSICAL EXAM:  Vitals:   06/28/24  1052  BP: 126/82  Pulse: 64  Resp: 15  SpO2: 96%   No data found. Body mass index is 30.6 kg/m.   Wt Readings from Last 3 Encounters:  06/28/24 244 lb 12.8 oz (111 kg)  02/15/24 250 lb (113.4 kg)  12/20/23 254 lb 6.4 oz (115.4 kg)     Ht Readings from Last 3 Encounters:  06/28/24 6' 3 (1.905 m)  02/15/24 6' 2.5 (1.892 m)  12/20/23 6' 3 (1.905 m)      General: TThe patient is awake, alert and appears  in acute distress from hay fever and sinusitis. Nasal congestion is noted.  The patient is well groomed. He has visible pressure marks.  Head: Normocephalic, atraumatic. Neck is supple. Circumference at 19- inches   Circumference, from 21 .   Mallampati 3 plus - large neck, small opening   Sinus congestion. Retrognathia is noted .  Cardiovascular: Regular rate and rhythm, without murmurs or carotid bruit, and without distended neck veins. Respiratory: Lungs are clear to auscultation. He has faster RR 23- min.  Skin:   Ankle edema, affecting the ankle sensation for vibration.  Varicose veins visible in both lower extremities.  Trunk: BMI reduced to 33.87,  he has an erect posture.   Neurologic exam : The patient is awake and alert, oriented to place and time.   Memory subjective  described as improved.   Neurological Examination: Mental Status: Intact. Language and speech are normal. No cognitive deficits. Cranial nerves: no loss of smell or taste- Pupils are equally round and reactive to light.  Status post cataract.  Fully vaccinated.  Nystagmus with gaze to the left , unrestricted visual field.  Facial motor strength is symmetric and his tongue is in  midline.  Hearing intact.  DTR intact, symmetrical. Babinski downgoing. Sensory: Grossly intact throughout to all modalities. Reflexes: Normal and symmetric throughout. No ankle clonus. Babinski's sign is absent bilaterally. Hoffman's sign is absent bilaterally. Gait and Station: Normal. Romberg's sign is absent.    ASSESSMENT AND PLAN :   73 y.o. year old male  here with:    1) need for a new CPAP,  has been on this CPAP before the pandemic, has had a second CPAP form before - older than this one.   I will send for HST, AHI last time was 10/h but strongly REM dependent he has lost 35 pounds since.   2)  MCI is no longer of concern to him.    RV in November 2025 with Np or me. If virtual visit please place at the first or last appointment of the day.     I would like to thank Avva, Ravisankar, MD  for allowing me to meet and follow up with this  pleasant patient.   Sleep Clinic Patients are generally offered input on sleep hygiene, life style changes and how to improve compliance with medical treatment where applicable. Review and reiteration of good sleep hygiene measures is offered to any sleep clinic patient, be it in the first consultation or with any follow up visits. Any CPAP patient should be reminded to be fully compliant with PAP therapy , (defined as using PAP therapy for more than 4 hours each night ) with the goal to improve sleep related symptoms and decrease long term cardiovascular risks. Any PAP therapy patient should be reminded, that it may take up to 3 months to get fully used to using PAP and it may take 1-2 weeks for an established CPAP user to acclimatize to changes in pressure or mask. The earlier full compliance is achieved, the better long term compliance tends to be.   Please note that untreated obstructive sleep apnea may carry additional perioperative morbidity. Patients with significant obstructive sleep apnea should receive perioperative PAP therapy and the surgical team should be informed of the diagnosis and degree of sleep disordered breathing.  Sleep fragmentation in the presence of normal proportional sleep stages is a nonspecific findings and per se does not signify an intrinsic sleep disorder or a cause for the patient's sleep-related symptoms.  Causes include  (but are not limited to) the unfamiliarity of sleeping while recorded by HST device or sleeping in a sleep lab for a full Polysomnography sleep study, but also circadian rhythm disturbances, medication side effects or an underlying mood disorder or medical problem.    Any patient with sleepiness should be cautioned not to drive, work at heights, or operate dangerous or heavy equipment when feeling tired or sleepy.    The patient will be seen in follow-up in the sleep clinic at [facility] for discussion of test results, sleep related symptoms and treatment compliance review, further management strategies, etc.   The referring provider will be notified of the test results.   The patient's condition requires frequent monitoring and adjustments in the treatment plan, reflecting the ongoing complexity of care.  This provider is the continuing focal point for all needed services for this condition.  After spending a total time of  25  minutes face to face and time for  history taking, physical and neurologic examination, review of laboratory studies,  personal review of imaging studies, reports and results of other testing and review of referral information / records as far as provided in visit,   Electronically signed by: Dedra Gores, MD 06/28/2024 11:14 AM  [facility] Board certified by The [employer] of Sleep Medicine and Diplomate of the [employer] of Sleep Medicine. Board certified In Neurology through the ABPN, Fellow of the [employer] of Neurology.

## 2024-06-30 ENCOUNTER — Encounter: Payer: Self-pay | Admitting: Neurology

## 2024-07-03 ENCOUNTER — Encounter (HOSPITAL_BASED_OUTPATIENT_CLINIC_OR_DEPARTMENT_OTHER): Admitting: Physical Therapy

## 2024-07-03 DIAGNOSIS — M4326 Fusion of spine, lumbar region: Secondary | ICD-10-CM | POA: Diagnosis not present

## 2024-07-04 DIAGNOSIS — E291 Testicular hypofunction: Secondary | ICD-10-CM | POA: Diagnosis not present

## 2024-07-06 DIAGNOSIS — E291 Testicular hypofunction: Secondary | ICD-10-CM | POA: Diagnosis not present

## 2024-07-06 DIAGNOSIS — Z1212 Encounter for screening for malignant neoplasm of rectum: Secondary | ICD-10-CM | POA: Diagnosis not present

## 2024-07-06 DIAGNOSIS — E7849 Other hyperlipidemia: Secondary | ICD-10-CM | POA: Diagnosis not present

## 2024-07-10 ENCOUNTER — Encounter (HOSPITAL_BASED_OUTPATIENT_CLINIC_OR_DEPARTMENT_OTHER): Payer: Self-pay | Admitting: Physical Therapy

## 2024-07-10 ENCOUNTER — Ambulatory Visit (HOSPITAL_BASED_OUTPATIENT_CLINIC_OR_DEPARTMENT_OTHER): Attending: Neurosurgery | Admitting: Physical Therapy

## 2024-07-10 DIAGNOSIS — M5459 Other low back pain: Secondary | ICD-10-CM | POA: Insufficient documentation

## 2024-07-10 NOTE — Therapy (Signed)
 OUTPATIENT PHYSICAL THERAPY Treatment   Patient Name: Brian Cortez MRN: [id number] DOB:1951-01-08, 73 y.o., male Today's Date: 07/10/2024  END OF SESSION:  PT End of Session - 07/10/24 1012     Visit Number 2    Number of Visits 7    Date for PT Re-Evaluation 08/25/24    Authorization Type [employer] MCR    Progress Note Due on Visit 10    PT Start Time 1015    PT Stop Time 1046    PT Time Calculation (min) 31 min    Activity Tolerance Patient tolerated treatment well    Behavior During Therapy WFL for tasks assessed/performed          Past Medical History:  Diagnosis Date   Acute pancreatitis    Anxiety    Bladder tumor    Carpal tunnel syndrome, left    intermittant   Chronic seasonal allergic rhinitis    Coronary artery disease    Coronary calcium  score is very high at 1582   DDD (degenerative disc disease), cervical    Diabetes mellitus type 2 in nonobese (HCC)    Dilated cardiomyopathy (HCC)    Diverticulosis of colon    Fecal incontinence    GERD (gastroesophageal reflux disease)    Grade II diastolic dysfunction 06/17/2017   per ECHO    Hemorrhoids    Hiatal hernia    History of adenomatous polyp of colon    2001;  2007;  2012   History of bladder cancer urologist-  dr chales   11-06-2013  s/p TURBT and BCG tx's   History of kidney stones    about 45 years ago   History of panic attacks    History of urethral stricture    Hyperlipidemia    Hypertension    Hypogonadism in male    LVH (left ventricular hypertrophy) 06/17/2017   Mild, noted on ECHO   Nonischemic cardiomyopathy [facility])    cardiologist-  dr rolan-- per last echo 01/ 2016 ef 50-55%  (up from 09/ 2014 ef 45-50%)   OSA on CPAP    since 2014   Pneumonia 11/2010   Left lower lobe   Pulmonary hypertension (HCC) 06/17/2017   Mild, per ECHO   Past Surgical History:  Procedure Laterality Date   CARDIOVASCULAR STRESS TEST  08-21-2013  dr rolan   normal nuclear study w/ no ischemia/   normal LV function and wall motion , ef 63%   CATARACT EXTRACTION W/ INTRAOCULAR LENS  IMPLANT, BILATERAL  2013   COLONOSCOPY  last one 03-03-2016   CYSTO/  DIRECT VISION INTERNAL URETEROTOMY  11/25/2006   LAPAROSCOPIC CHOLECYSTECTOMY  10/15/2006   MENISCUS REPAIR Right 12/2020   ORBITAL FRACTURE SURGERY  1986   left eye:  Jan 1986--- fixation of fractures, repair of optic nerve decompression , and complex closure   TONSILLECTOMY  child   TRANSTHORACIC ECHOCARDIOGRAM  12-16-2014   dr rolan   grade 1 diastolic dysfunction, ef 50-55% (improved from last study in 2014 , previous ef 45-50%) /  trivial MR and TR/  mild LAE/  inferior vena cava dilated, respirophasic diameter changes were blunted (<50%), consistant w/ elevated central venous pressure   TRANSURETHRAL RESECTION OF BLADDER TUMOR Bilateral 05/16/2018   Procedure: TRANSURETHRAL RESECTION OF BLADDER TUMOR (TURBT)/ RETROGRADE;  Surgeon: Nieves Cough, MD;  Location: [facility];  Service: Urology;  Laterality: Bilateral;   TRANSURETHRAL RESECTION OF BLADDER TUMOR WITH GYRUS (TURBT-GYRUS) N/A 11/06/2013   Procedure: TRANSURETHRAL RESECTION OF  BLADDER TUMOR WITH GYRUS (TURBT-GYRUS);  Surgeon: Arlena LILLETTE Gal, MD;  Location: [facility];  Service: Urology;  Laterality: N/A;   TRANSURETHRAL RESECTION OF BLADDER TUMOR WITH MITOMYCIN -C N/A 12/09/2016   Procedure: CYSTOSCOPY TRANSURETHRAL RESECTION OF BLADDER TUMOR WITH MITOMYCIN -C;  Surgeon: Arlena Gal, MD;  Location: [facility] [place];  Service: Urology;  Laterality: N/A;   VARICOSE VEIN SURGERY  2002   VASECTOMY     Patient Active Problem List   Diagnosis Date Noted   MCI (mild cognitive impairment) 05/16/2023   Primary open-angle glaucoma (POAG), moderate stage 05/13/2022   History of vitrectomy 04/08/2022   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   Chronic kidney disease, stage 3b (HCC) 05/15/2021   COVID-19 05/15/2021    Mild cognitive disorder 05/15/2021   Bilateral impacted cerumen 02/13/2021   Deviated septum 02/13/2021   Epistaxis 02/13/2021   Myopic macular degeneration 04/02/2020   Posterior vitreous detachment of right eye 04/02/2020   Early stage nonexudative age-related macular degeneration of both eyes 04/02/2020   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 04/02/2020   Low back pain 03/04/2020   Other specified local infections of the skin and subcutaneous tissue 08/20/2019   Secondary polycythemia 08/06/2019   Atherosclerosis of coronary artery 02/07/2019   Tinea cruris 10/16/2018   Neoplastic malignant related fatigue 09/20/2018   Convalescence after chemotherapy 09/20/2018   Anterior urethral stricture 08/04/2018   Bladder trabeculation 08/04/2018   Diverticula, bladder acquired 08/04/2018   Erectile dysfunction due to diseases classified elsewhere 08/04/2018   Low testosterone  in male 08/04/2018   Primary open angle glaucoma (POAG) of both eyes, moderate stage 02/10/2018   Obesity with body mass index greater than 30 09/14/2017   Incontinence of feces 04/22/2017   Pain in right knee 01/25/2017   Nasal congestion with rhinorrhea 09/08/2016   Chronic seasonal allergic rhinitis due to pollen 09/08/2016   Impaired fasting glucose 12/10/2015   Postherpetic neuralgia 11/12/2015   Herpes zoster without complication 11/04/2015   Acute recurrent ethmoidal sinusitis 09/09/2015   Bronchitis 09/09/2015   OSA on CPAP 09/09/2015   Rhinitis due to pollen 09/09/2015   Plantar fascial fibromatosis 11/25/2014   CAP (community acquired pneumonia) 10/15/2014   Allergic rhinitis 10/15/2014   Atelectasis 09/30/2014   Abnormal weight loss 09/06/2014   Cough 09/06/2014   CPAP rhinitis 08/27/2014   Anxiety disorder 03/15/2014   Colorectal polyps 03/15/2014   Glaucoma 03/15/2014   Screening for depression 03/15/2014   Male hypogonadism 03/15/2014   Other ill-defined heart diseases 03/15/2014    Bladder cancer (HCC) 11/06/2013   Sleep apnea with use of continuous positive airway pressure (CPAP) 08/23/2013   Coronary artery calcification seen on CAT scan 08/17/2013   Cardiomyopathy (HCC) 08/17/2013   Diastolic dysfunction 08/07/2013   Testosterone  deficiency 10/18/2012   CARPAL TUNNEL SYNDROME, LEFT 03/12/2010   Brachial neuritis or radiculitis 03/12/2010   NONSPECIFIC ABNORMAL ELECTROCARDIOGRAM 10/08/2009   ADENOMATOUS COLONIC POLYP 04/06/2008   Diverticulosis of colon 04/06/2008   Diaphragmatic hernia 12/18/2007   HYPERLIPIDEMIA 10/20/2007   Essential hypertension 05/30/2007   G E R D 05/30/2007   Other specified abnormal findings of blood chemistry 12/19/2006     REFERRING PROVIDER:  Cozette Delon Helling, PA-C     REFERRING DIAG:  M54.59 (ICD-10-CM) - Other low back pain  M54.16 (ICD-10-CM) - Radiculopathy, lumbar region  M48.062 (ICD-10-CM) - Spinal stenosis, lumbar region with neurogenic claudication  M47.816 (ICD-10-CM) - Spondylosis without myelopathy or radiculopathy, lumbar region  Rationale for Evaluation and Treatment: Rehabilitation  THERAPY DIAG:  Other low back pain  ONSET DATE: July 2024   SUBJECTIVE:                                                                                                                                                                                           SUBJECTIVE STATEMENT: Reeval: 06/07/24 L2-3 TLIF by Dr Dorn Jama Blush, MD. I saw the PA last week and she authorized me to progress lifting to 35lb and resume pilates. I did a 30 min class yesterday and later I started having Lt-sided LBP. Finally started sleeping a little better in the last 2 nights.  Would like to get back to [facility] when surgeon clears.  Morning pain is almost gone.   EVAL: July 2024 had pancreatitis and weakness in L2-3, 4-5. Went to [facility] where I have done shots in the past & suggested MRI. Referred to neurosurgeon, saw dr mavis.  Referred to pain MD- 4 shots twice, first time worked for 2.5 hr and didn't work the second time. Did ablation but with no change. Provided with anti-inflammatories. Tried acupuncture, 4 treatments, started feeling some relief after 3rd & 4th. Chipped 15-18 golf balls, tried full swings with little twinge, that evening was cooking for church and back started to hurt. Next morning I was in terrible pain, could hardly walk to the bathroom and back. Saw Dr Jama at [facility] who wanted an updated MRI- showed L2-3 deterioration moreso than 4-5. Went to [place], took prednisone which helped until the end of the Rx so started hydrocodone - seemed to do ok. Ran out, woke up Sunday AM and was in terrible pain. Moved to oxycodone , has been on it for 10 days, very little cut of pain. A week ago, tomorrow, [facility] denied sx bc of lack of 6 wk of preop PT.  Was playing golf, reformer pilates 3d/wk, 1/2 hr Trx with a trainer and 45 min on Fridays. Has stopped due to back pain.  H/o bladder CAx2. Currently taking 600 mg gabapentin TID and oxy 1 every 4 hr.  Get up bw 530-630, pain 8-8.5/10. Rt side pain wraps around lateral hip and up to iliac crest. Laying on either side hurts. Denies pain into legs. Lifting weight can create pain on lateral right hip to iliac crest.   PERTINENT HISTORY:  CKD, h/o bladder CA  PAIN:  Are you having pain? Yes: NPRS scale: mild right now Pain location: lower back to Rt lateral hip, proximal to Gr trochanter to iliac crest Pain description: severe  Aggravating factors: laying on either side, AM pain, lifting items, flexion  to floor Relieving factors: pain meds a little  PRECAUTIONS:  None  RED FLAGS: None   WEIGHT BEARING RESTRICTIONS:  No  FALLS:  Has patient fallen in last 6 months? No  OCCUPATION:  retired  PLOF:  Independent  PATIENT GOALS:  Back to golf, travel, decrease pain   OBJECTIVE:  Note: Objective measures were completed at Evaluation unless otherwise  noted.  DIAGNOSTIC FINDINGS:  CT Lumbar 05/30/23: FINDINGS: Segmentation: 5 lumbar type vertebrae.   Alignment: Normal.   Vertebrae: Inferior endplate Schmorl's nodes at L2 and L3 are unchanged from prior. New superior endplate Schmorl's node at L3. No acute fracture. No suspicious bone lesion.   Paraspinal and other soft tissues: No paraspinal inflammatory changes.   Disc levels: Mild disc height loss at L2-3 and L3-4. Mild multilevel facet arthropathy.   IMPRESSION: 1. No acute fracture or traumatic malalignment of the lumbar spine. 2. Mild degenerative changes of the lumbar spine.    PATIENT SURVEYS:  Modified Oswestry 27/50= 54%  8/19 Mod oswestry 7/50 =14%  COGNITIVE STATUS: Per MD note 12/15/23: diagnosed with a mild cognitive disorder with memory loss    SENSATION: WFL  POSTURE:  rounded shoulders, decreased lumbar lordosis, and increased thoracic kyphosis, posterior pelvic tilt, lacking full knee extension bilat in standing <10 deg   Body Part #1 Lumbar  PALPATION: Eval: no recreation of concordant pain with central and lateral PA or SIJ PA mobs  Tightness in Rt lumbar paraspinals, QL and glut med   EVAL ROM: no distal symptoms with movement, some midline discomfort with extension, Full ROM available   Notable lack of hip extension in Rt hip during golf follow through- concordant pain in hip recreated  EVAL MMT- Rt hip flexion & abd 5/5                                                                                                                             TREATMENT DATE:   EVAL 05/08/24: Discussed return to pilates & things to avoid for now- provided with number to connect with instructors Discussed progressive return to golf Hip hinge with dowel for form   PATIENT EDUCATION:  Education details: Anatomy of condition, POC, HEP, exercise form/rationale Person educated: Patient Education method: Explanation, Demonstration, Tactile cues, and Verbal  cues Education comprehension: verbalized understanding, returned demonstration, verbal cues required, tactile cues required, and needs further education  HOME EXERCISE PROGRAM: Return to Land class, gentle AM stretches & glut set to stand out of bed   ASSESSMENT:  CLINICAL IMPRESSION: Pt has returned to PT following TLIF to L2-3 that has resulted in a resolution of multiple symptoms. Pt is able to demonstrate a fully stacked posture but does c/o Lt-sided QL tightness/discomfort. Discussed change in length/tension relationships following biomechanical changes. Requested that he avoid feet in straps on reformer for now and to drop back to early pilates intro exercises to ensure he is able to properly recruit all muscle groups,  particularly the core. Encouraged him to return to putting with proper hip hinge but hold on any larger golf swings. Will benefit from skilled PT to progress functional strength and activity tolerance with reduced risk of injury.    EVAL: Patient is a 73 y.o. M who was seen today for physical therapy evaluation and treatment for chronic, worsening low back pain that wraps to Rt lateral hip. Pt does present with tightness in glutes and TFL. Was unable to recreate concordant pain with SLR test or joint mobilizations. Pt plans to return to neurosurgeon for surgery on lumbar spine at L2-3 but insurance is requiring PT. Pt will return to pilates reformer classes in a gentle manner as he has been doing it for a long time. Will also try gentle mobility exercises to reduce the severe AM pain.     OBJECTIVE IMPAIRMENTS: decreased activity tolerance, increased muscle spasms, impaired flexibility, postural dysfunction, and pain.   ACTIVITY LIMITATIONS: lifting, bending, sitting, sleeping, and recreation/fitness  PARTICIPATION LIMITATIONS: meal prep, cleaning, laundry, and community activity  PERSONAL FACTORS: see PMH are also affecting patient's functional outcome.    REHAB POTENTIAL: Fair planning surgery  CLINICAL DECISION MAKING: Evolving/moderate complexity  EVALUATION COMPLEXITY: Moderate   GOALS: Goals reviewed with patient? Yes  SHORT TERM GOALS: Target date: 6/28  Verbalize compliance with AM stretches and mobility Baseline: discussed at eval Goal status: met    LONG TERM GOALS: Target date:  POC date  Performing pilates exercises without increased pain Baseline: just started back yesterday Goal status: ongoing 8/19  2.  Improve flexibility to allow full knee extension bilaterally in standing Baseline:  Goal status: Met 8/19  3.  Verbalize reduction in AM spasm Baseline: 95% improved Goal status: ongoing 8/19  4. Get back to playing golf  Baseline  Goal status: new     PLAN:  PT FREQUENCY: 1x/week  PT DURATION: 6 weeks  PLANNED INTERVENTIONS: 97164- PT Re-evaluation, 97750- Physical Performance Testing, 97110-Therapeutic exercises, 97530- Therapeutic activity, 97112- Neuromuscular re-education, 97535- Self Care, 02859- Manual therapy, [phone]- Gait training, [phone]- Aquatic Therapy, [phone] (1-2 muscles), [phone] (3+ muscles)- Dry Needling, Patient/Family education, Balance training, Stair training, Taping, Joint mobilization, Spinal mobilization, Cryotherapy, and Moist heat.  PLAN FOR NEXT SESSION: Manual PRN, core stability, connect with pilates instructors?  Harlene Cordon, PT, DPT 07/10/2024, 2:28 PM

## 2024-07-11 DIAGNOSIS — E291 Testicular hypofunction: Secondary | ICD-10-CM | POA: Diagnosis not present

## 2024-07-12 ENCOUNTER — Encounter (HOSPITAL_BASED_OUTPATIENT_CLINIC_OR_DEPARTMENT_OTHER): Admitting: Physical Therapy

## 2024-07-13 DIAGNOSIS — R82998 Other abnormal findings in urine: Secondary | ICD-10-CM | POA: Diagnosis not present

## 2024-07-13 DIAGNOSIS — I10 Essential (primary) hypertension: Secondary | ICD-10-CM | POA: Diagnosis not present

## 2024-07-16 ENCOUNTER — Encounter: Payer: Self-pay | Admitting: Podiatry

## 2024-07-16 ENCOUNTER — Ambulatory Visit: Admitting: Podiatry

## 2024-07-16 DIAGNOSIS — B351 Tinea unguium: Secondary | ICD-10-CM

## 2024-07-16 MED ORDER — TERBINAFINE HCL 250 MG PO TABS: 250.0000 mg | ORAL_TABLET | Freq: Every day | ORAL | 1 refills | Status: AC

## 2024-07-16 NOTE — Progress Notes (Signed)
 Chief Complaint  Patient presents with   Nail Problem    Hallux right - toenail thick and discolored again, was on terbinafine  in end of 2023, tender at times, would like treatment again   New Patient (Initial Visit)    Est pt 2023    Subjective: 73 y.o. male presenting today for evaluation of fungal nail infection to the right great toe.  He has had some moderate success with Lamisil .  He would like to pursue treatment again  Past Medical History:  Diagnosis Date   Acute pancreatitis    Anxiety    Bladder tumor    Carpal tunnel syndrome, left    intermittant   Chronic seasonal allergic rhinitis    Coronary artery disease    Coronary calcium  score is very high at 1582   DDD (degenerative disc disease), cervical    Diabetes mellitus type 2 in nonobese (HCC)    Dilated cardiomyopathy (HCC)    Diverticulosis of colon    Fecal incontinence    GERD (gastroesophageal reflux disease)    Grade II diastolic dysfunction 06/17/2017   per ECHO    Hemorrhoids    Hiatal hernia    History of adenomatous polyp of colon    2001;  2007;  2012   History of bladder cancer urologist-  dr chales   11-06-2013  s/p TURBT and BCG tx's   History of kidney stones    about 45 years ago   History of panic attacks    History of urethral stricture    Hyperlipidemia    Hypertension    Hypogonadism in male    LVH (left ventricular hypertrophy) 06/17/2017   Mild, noted on ECHO   Nonischemic cardiomyopathy [facility])    cardiologist-  dr rolan-- per last echo 01/ 2016 ef 50-55%  (up from 09/ 2014 ef 45-50%)   OSA on CPAP    since 2014   Pneumonia 11/2010   Left lower lobe   Pulmonary hypertension (HCC) 06/17/2017   Mild, per ECHO    Past Surgical History:  Procedure Laterality Date   CARDIOVASCULAR STRESS TEST  08-21-2013  dr rolan   normal nuclear study w/ no ischemia/  normal LV function and wall motion , ef 63%   CATARACT EXTRACTION W/ INTRAOCULAR LENS  IMPLANT, BILATERAL  2013    COLONOSCOPY  last one 03-03-2016   CYSTO/  DIRECT VISION INTERNAL URETEROTOMY  11/25/2006   LAPAROSCOPIC CHOLECYSTECTOMY  10/15/2006   MENISCUS REPAIR Right 12/2020   ORBITAL FRACTURE SURGERY  1986   left eye:  Jan 1986--- fixation of fractures, repair of optic nerve decompression , and complex closure   TONSILLECTOMY  child   TRANSTHORACIC ECHOCARDIOGRAM  12-16-2014   dr rolan   grade 1 diastolic dysfunction, ef 50-55% (improved from last study in 2014 , previous ef 45-50%) /  trivial MR and TR/  mild LAE/  inferior vena cava dilated, respirophasic diameter changes were blunted (<50%), consistant w/ elevated central venous pressure   TRANSURETHRAL RESECTION OF BLADDER TUMOR Bilateral 05/16/2018   Procedure: TRANSURETHRAL RESECTION OF BLADDER TUMOR (TURBT)/ RETROGRADE;  Surgeon: Nieves Cough, MD;  Location: [facility];  Service: Urology;  Laterality: Bilateral;   TRANSURETHRAL RESECTION OF BLADDER TUMOR WITH GYRUS (TURBT-GYRUS) N/A 11/06/2013   Procedure: TRANSURETHRAL RESECTION OF BLADDER TUMOR WITH GYRUS (TURBT-GYRUS);  Surgeon: Arlena LILLETTE chales, MD;  Location: [facility];  Service: Urology;  Laterality: N/A;   TRANSURETHRAL RESECTION OF BLADDER TUMOR WITH MITOMYCIN -C N/A 12/09/2016  Procedure: CYSTOSCOPY TRANSURETHRAL RESECTION OF BLADDER TUMOR WITH MITOMYCIN -C;  Surgeon: Arlena Gal, MD;  Location: [facility];  Service: Urology;  Laterality: N/A;   VARICOSE VEIN SURGERY  2002   VASECTOMY      Allergies  Allergen Reactions   Bee Pollen     Other reaction(s): Cough sinus issues   Pollen Extract Cough    sinus issues   Short Ragweed Pollen Ext Itching and Shortness Of Breath   Oxycodone  Itching   Percocet [Oxycodone -Acetaminophen ] Hives   Pine Tar Itching and Other (See Comments)    Sinus congestion   Sulfamethoxazole Nausea And Vomiting, Hives and Nausea Only   Sulfamethoxazole-Trimethoprim  Rash    Objective: Physical  Exam General: The patient is alert and oriented x3 in no acute distress.  Dermatology: Hyperkeratotic, discolored, thickened, onychodystrophy noted. Skin is warm, dry and supple bilateral lower extremities. Negative for open lesions or macerations.  Vascular: Palpable pedal pulses bilaterally. No edema or erythema noted. Capillary refill within normal limits.  Neurological: Grossly intact via light touch  Musculoskeletal Exam: No pedal deformity noted  Assessment: #1 Onychomycosis of toenail right hallux nail plate  Plan of Care:  #1 Patient was evaluated. #2  Today we discussed different treatment options including oral, topical, and laser antifungal treatment modalities.  We discussed their efficacies and side effects.  Patient opts for oral antifungal treatment modality #3 prescription for Lamisil  250 mg #90 daily. Pt denies a history of liver pathology or symptoms.  Patient is otherwise healthy #4 return to clinic 6 months   Thresa EMERSON Sar, DPM [facility]  Dr. Thresa EMERSON Sar, DPM    [address] [place], [place] [address]                Office [phone]  Fax [phone]

## 2024-07-17 ENCOUNTER — Ambulatory Visit (HOSPITAL_BASED_OUTPATIENT_CLINIC_OR_DEPARTMENT_OTHER): Admitting: Physical Therapy

## 2024-07-17 ENCOUNTER — Telehealth: Payer: Self-pay | Admitting: Neurology

## 2024-07-17 ENCOUNTER — Encounter (HOSPITAL_BASED_OUTPATIENT_CLINIC_OR_DEPARTMENT_OTHER): Payer: Self-pay | Admitting: Physical Therapy

## 2024-07-17 DIAGNOSIS — M5459 Other low back pain: Secondary | ICD-10-CM | POA: Diagnosis not present

## 2024-07-17 NOTE — Telephone Encounter (Signed)
 Called the wife back. She states that she has noticed on going problems with him moving a lot in sleep. She is unsure if there is anything concerning but she states he kicks out in sleep. This was going on before he had his back surgery 5 wks ago. He is scheduled to have a HST and she asked would this be picked up. I informed her that it would not likely pick up restlessness type movement. Advised to monitor this further and see if this doesn't get better since he has had back surgery. We can always reassess at upcoming visit for the initial cpap compliance.

## 2024-07-17 NOTE — Telephone Encounter (Signed)
 Pt wife called to follow up with MD about Pt sleeping Condition the patient  doesn't know she is calling  . PT wife will like to speak to Nurse    Wife number   [phone]

## 2024-07-17 NOTE — Therapy (Signed)
 OUTPATIENT PHYSICAL THERAPY Treatment   Patient Name: Brian Cortez MRN: [id number] DOB:1951-02-01, 73 y.o., male Today's Date: 07/17/2024  END OF SESSION:  PT End of Session - 07/17/24 1015     Visit Number 3    Number of Visits 7    Date for PT Re-Evaluation 08/25/24    Authorization Type [employer] MCR    Progress Note Due on Visit 10    PT Start Time 1012    PT Stop Time 1050    PT Time Calculation (min) 38 min    Activity Tolerance Patient tolerated treatment well    Behavior During Therapy WFL for tasks assessed/performed          Past Medical History:  Diagnosis Date   Acute pancreatitis    Anxiety    Bladder tumor    Carpal tunnel syndrome, left    intermittant   Chronic seasonal allergic rhinitis    Coronary artery disease    Coronary calcium  score is very high at 1582   DDD (degenerative disc disease), cervical    Diabetes mellitus type 2 in nonobese (HCC)    Dilated cardiomyopathy (HCC)    Diverticulosis of colon    Fecal incontinence    GERD (gastroesophageal reflux disease)    Grade II diastolic dysfunction 06/17/2017   per ECHO    Hemorrhoids    Hiatal hernia    History of adenomatous polyp of colon    2001;  2007;  2012   History of bladder cancer urologist-  dr chales   11-06-2013  s/p TURBT and BCG tx's   History of kidney stones    about 45 years ago   History of panic attacks    History of urethral stricture    Hyperlipidemia    Hypertension    Hypogonadism in male    LVH (left ventricular hypertrophy) 06/17/2017   Mild, noted on ECHO   Nonischemic cardiomyopathy [facility])    cardiologist-  dr rolan-- per last echo 01/ 2016 ef 50-55%  (up from 09/ 2014 ef 45-50%)   OSA on CPAP    since 2014   Pneumonia 11/2010   Left lower lobe   Pulmonary hypertension (HCC) 06/17/2017   Mild, per ECHO   Past Surgical History:  Procedure Laterality Date   CARDIOVASCULAR STRESS TEST  08-21-2013  dr rolan   normal nuclear study w/ no ischemia/   normal LV function and wall motion , ef 63%   CATARACT EXTRACTION W/ INTRAOCULAR LENS  IMPLANT, BILATERAL  2013   COLONOSCOPY  last one 03-03-2016   CYSTO/  DIRECT VISION INTERNAL URETEROTOMY  11/25/2006   LAPAROSCOPIC CHOLECYSTECTOMY  10/15/2006   MENISCUS REPAIR Right 12/2020   ORBITAL FRACTURE SURGERY  1986   left eye:  Jan 1986--- fixation of fractures, repair of optic nerve decompression , and complex closure   TONSILLECTOMY  child   TRANSTHORACIC ECHOCARDIOGRAM  12-16-2014   dr rolan   grade 1 diastolic dysfunction, ef 50-55% (improved from last study in 2014 , previous ef 45-50%) /  trivial MR and TR/  mild LAE/  inferior vena cava dilated, respirophasic diameter changes were blunted (<50%), consistant w/ elevated central venous pressure   TRANSURETHRAL RESECTION OF BLADDER TUMOR Bilateral 05/16/2018   Procedure: TRANSURETHRAL RESECTION OF BLADDER TUMOR (TURBT)/ RETROGRADE;  Surgeon: Nieves Cough, MD;  Location: [facility];  Service: Urology;  Laterality: Bilateral;   TRANSURETHRAL RESECTION OF BLADDER TUMOR WITH GYRUS (TURBT-GYRUS) N/A 11/06/2013   Procedure: TRANSURETHRAL RESECTION OF  BLADDER TUMOR WITH GYRUS (TURBT-GYRUS);  Surgeon: Arlena LILLETTE Gal, MD;  Location: [facility];  Service: Urology;  Laterality: N/A;   TRANSURETHRAL RESECTION OF BLADDER TUMOR WITH MITOMYCIN -C N/A 12/09/2016   Procedure: CYSTOSCOPY TRANSURETHRAL RESECTION OF BLADDER TUMOR WITH MITOMYCIN -C;  Surgeon: Arlena Gal, MD;  Location: [facility] [place];  Service: Urology;  Laterality: N/A;   VARICOSE VEIN SURGERY  2002   VASECTOMY     Patient Active Problem List   Diagnosis Date Noted   MCI (mild cognitive impairment) 05/16/2023   Primary open-angle glaucoma (POAG), moderate stage 05/13/2022   History of vitrectomy 04/08/2022   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   Chronic kidney disease, stage 3b (HCC) 05/15/2021   COVID-19 05/15/2021    Mild cognitive disorder 05/15/2021   Bilateral impacted cerumen 02/13/2021   Deviated septum 02/13/2021   Epistaxis 02/13/2021   Myopic macular degeneration 04/02/2020   Posterior vitreous detachment of right eye 04/02/2020   Early stage nonexudative age-related macular degeneration of both eyes 04/02/2020   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 04/02/2020   Low back pain 03/04/2020   Other specified local infections of the skin and subcutaneous tissue 08/20/2019   Secondary polycythemia 08/06/2019   Atherosclerosis of coronary artery 02/07/2019   Tinea cruris 10/16/2018   Neoplastic malignant related fatigue 09/20/2018   Convalescence after chemotherapy 09/20/2018   Anterior urethral stricture 08/04/2018   Bladder trabeculation 08/04/2018   Diverticula, bladder acquired 08/04/2018   Erectile dysfunction due to diseases classified elsewhere 08/04/2018   Low testosterone  in male 08/04/2018   Primary open angle glaucoma (POAG) of both eyes, moderate stage 02/10/2018   Obesity with body mass index greater than 30 09/14/2017   Incontinence of feces 04/22/2017   Pain in right knee 01/25/2017   Nasal congestion with rhinorrhea 09/08/2016   Chronic seasonal allergic rhinitis due to pollen 09/08/2016   Impaired fasting glucose 12/10/2015   Postherpetic neuralgia 11/12/2015   Herpes zoster without complication 11/04/2015   Acute recurrent ethmoidal sinusitis 09/09/2015   Bronchitis 09/09/2015   OSA on CPAP 09/09/2015   Rhinitis due to pollen 09/09/2015   Plantar fascial fibromatosis 11/25/2014   CAP (community acquired pneumonia) 10/15/2014   Allergic rhinitis 10/15/2014   Atelectasis 09/30/2014   Abnormal weight loss 09/06/2014   Cough 09/06/2014   CPAP rhinitis 08/27/2014   Anxiety disorder 03/15/2014   Colorectal polyps 03/15/2014   Glaucoma 03/15/2014   Screening for depression 03/15/2014   Male hypogonadism 03/15/2014   Other ill-defined heart diseases 03/15/2014    Bladder cancer (HCC) 11/06/2013   Sleep apnea with use of continuous positive airway pressure (CPAP) 08/23/2013   Coronary artery calcification seen on CAT scan 08/17/2013   Cardiomyopathy (HCC) 08/17/2013   Diastolic dysfunction 08/07/2013   Testosterone  deficiency 10/18/2012   CARPAL TUNNEL SYNDROME, LEFT 03/12/2010   Brachial neuritis or radiculitis 03/12/2010   NONSPECIFIC ABNORMAL ELECTROCARDIOGRAM 10/08/2009   ADENOMATOUS COLONIC POLYP 04/06/2008   Diverticulosis of colon 04/06/2008   Diaphragmatic hernia 12/18/2007   HYPERLIPIDEMIA 10/20/2007   Essential hypertension 05/30/2007   G E R D 05/30/2007   Other specified abnormal findings of blood chemistry 12/19/2006     REFERRING PROVIDER:  Cozette Delon Helling, PA-C     REFERRING DIAG:  M54.59 (ICD-10-CM) - Other low back pain  M54.16 (ICD-10-CM) - Radiculopathy, lumbar region  M48.062 (ICD-10-CM) - Spinal stenosis, lumbar region with neurogenic claudication  M47.816 (ICD-10-CM) - Spondylosis without myelopathy or radiculopathy, lumbar region  Rationale for Evaluation and Treatment: Rehabilitation  THERAPY DIAG:  Other low back pain  ONSET DATE: July 2024   SUBJECTIVE:                                                                                                                                                                                           SUBJECTIVE STATEMENT: Going well in pilates without pain. PT sent email to instructor on things to avoid. My balance feels better.  EVAL: July 2024 had pancreatitis and weakness in L2-3, 4-5. Went to [facility] where I have done shots in the past & suggested MRI. Referred to neurosurgeon, saw dr mavis. Referred to pain MD- 4 shots twice, first time worked for 2.5 hr and didn't work the second time. Did ablation but with no change. Provided with anti-inflammatories. Tried acupuncture, 4 treatments, started feeling some relief after 3rd & 4th. Chipped 15-18 golf  balls, tried full swings with little twinge, that evening was cooking for church and back started to hurt. Next morning I was in terrible pain, could hardly walk to the bathroom and back. Saw Dr Jama at [facility] who wanted an updated MRI- showed L2-3 deterioration moreso than 4-5. Went to [place], took prednisone which helped until the end of the Rx so started hydrocodone - seemed to do ok. Ran out, woke up Sunday AM and was in terrible pain. Moved to oxycodone , has been on it for 10 days, very little cut of pain. A week ago, tomorrow, [facility] denied sx bc of lack of 6 wk of preop PT.  Was playing golf, reformer pilates 3d/wk, 1/2 hr Trx with a trainer and 45 min on Fridays. Has stopped due to back pain.  H/o bladder CAx2. Currently taking 600 mg gabapentin TID and oxy 1 every 4 hr.  Get up bw 530-630, pain 8-8.5/10. Rt side pain wraps around lateral hip and up to iliac crest. Laying on either side hurts. Denies pain into legs. Lifting weight can create pain on lateral right hip to iliac crest.   PERTINENT HISTORY:  CKD, h/o bladder CA  PAIN:  Are you having pain? Yes: NPRS scale: mild right now Pain location: lower back to Rt lateral hip, proximal to Gr trochanter to iliac crest Pain description: severe  Aggravating factors: laying on either side, AM pain, lifting items, flexion to floor Relieving factors: pain meds a little  PRECAUTIONS:  None  RED FLAGS: None   WEIGHT BEARING RESTRICTIONS:  No  FALLS:  Has patient fallen in last 6 months? No  OCCUPATION:  retired  PLOF:  Independent  PATIENT GOALS:  Back to golf, travel, decrease pain  OBJECTIVE:  Note: Objective measures were completed at Evaluation unless otherwise noted.  DIAGNOSTIC FINDINGS:  CT Lumbar 05/30/23: FINDINGS: Segmentation: 5 lumbar type vertebrae.   Alignment: Normal.   Vertebrae: Inferior endplate Schmorl's nodes at L2 and L3 are unchanged from prior. New superior endplate Schmorl's node at L3. No acute  fracture. No suspicious bone lesion.   Paraspinal and other soft tissues: No paraspinal inflammatory changes.   Disc levels: Mild disc height loss at L2-3 and L3-4. Mild multilevel facet arthropathy.   IMPRESSION: 1. No acute fracture or traumatic malalignment of the lumbar spine. 2. Mild degenerative changes of the lumbar spine.    PATIENT SURVEYS:  Modified Oswestry 27/50= 54%  8/19 Mod oswestry 7/50 =14%  COGNITIVE STATUS: Per MD note 12/15/23: diagnosed with a mild cognitive disorder with memory loss    SENSATION: WFL  POSTURE:  rounded shoulders, decreased lumbar lordosis, and increased thoracic kyphosis, posterior pelvic tilt, lacking full knee extension bilat in standing <10 deg   Body Part #1 Lumbar  PALPATION: Eval: no recreation of concordant pain with central and lateral PA or SIJ PA mobs  Tightness in Rt lumbar paraspinals, QL and glut med   EVAL ROM: no distal symptoms with movement, some midline discomfort with extension, Full ROM available   Notable lack of hip extension in Rt hip during golf follow through- concordant pain in hip recreated  EVAL MMT- Rt hip flexion & abd 5/5                                                                                                                             TREATMENT DATE:   8/26 Seated anchored, row, horiz abd flexion green tband Seated GHJ ER, alternating punch green tband Primal push up- elbow plank on toes- on knees Tall kneel hip hinge+ glut set Prone alt LE ext, UE/LE opp ext  EVAL 05/08/24: Discussed return to pilates & things to avoid for now- provided with number to connect with instructors Discussed progressive return to golf Hip hinge with dowel for form   PATIENT EDUCATION:  Education details: Anatomy of condition, POC, HEP, exercise form/rationale Person educated: Patient Education method: Explanation, Demonstration, Tactile cues, and Verbal cues Education comprehension: verbalized  understanding, returned demonstration, verbal cues required, tactile cues required, and needs further education  HOME EXERCISE PROGRAM: Return to Land class, gentle AM stretches & glut set to stand out of bed   ASSESSMENT:  CLINICAL IMPRESSION: Exercises for focus on core engagement with extremity movement to decrease tendency for lumbar extensor overuse. Prone exercises for lumbar stability and cues to avoid adding large muscle groups to increase range of movement. Pt denied increase in pain during activities but encouraged him to continue a gradual progression to respect healing time necessary.    EVAL: Patient is a 73 y.o. M who was seen today for physical therapy evaluation and treatment for chronic, worsening low back pain that wraps to Rt lateral hip. Pt  does present with tightness in glutes and TFL. Was unable to recreate concordant pain with SLR test or joint mobilizations. Pt plans to return to neurosurgeon for surgery on lumbar spine at L2-3 but insurance is requiring PT. Pt will return to pilates reformer classes in a gentle manner as he has been doing it for a long time. Will also try gentle mobility exercises to reduce the severe AM pain.     OBJECTIVE IMPAIRMENTS: decreased activity tolerance, increased muscle spasms, impaired flexibility, postural dysfunction, and pain.   ACTIVITY LIMITATIONS: lifting, bending, sitting, sleeping, and recreation/fitness  PARTICIPATION LIMITATIONS: meal prep, cleaning, laundry, and community activity  PERSONAL FACTORS: see PMH are also affecting patient's functional outcome.   REHAB POTENTIAL: Fair planning surgery  CLINICAL DECISION MAKING: Evolving/moderate complexity  EVALUATION COMPLEXITY: Moderate   GOALS: Goals reviewed with patient? Yes  SHORT TERM GOALS: Target date: 6/28  Verbalize compliance with AM stretches and mobility Baseline: discussed at eval Goal status: met    LONG TERM GOALS: Target date:  POC  date  Performing pilates exercises without increased pain Baseline: just started back yesterday Goal status: ongoing 8/19  2.  Improve flexibility to allow full knee extension bilaterally in standing Baseline:  Goal status: Met 8/19  3.  Verbalize reduction in AM spasm Baseline: 95% improved Goal status: ongoing 8/19  4. Get back to playing golf  Baseline  Goal status: new     PLAN:  PT FREQUENCY: 1x/week  PT DURATION: 6 weeks  PLANNED INTERVENTIONS: [id number]- PT Re-evaluation, 97750- Physical Performance Testing, 97110-Therapeutic exercises, 97530- Therapeutic activity, 97112- Neuromuscular re-education, 97535- Self Care, [id number]- Manual therapy, [phone]- Gait training, [phone]- Aquatic Therapy, [phone] (1-2 muscles), [phone] (3+ muscles)- Dry Needling, Patient/Family education, Balance training, Stair training, Taping, Joint mobilization, Spinal mobilization, Cryotherapy, and Moist heat.  PLAN FOR NEXT SESSION: Manual PRN, core stability Harlene Cordon, PT, DPT 07/17/2024, 10:53 AM

## 2024-07-18 DIAGNOSIS — E291 Testicular hypofunction: Secondary | ICD-10-CM | POA: Diagnosis not present

## 2024-07-19 ENCOUNTER — Encounter (HOSPITAL_BASED_OUTPATIENT_CLINIC_OR_DEPARTMENT_OTHER): Admitting: Physical Therapy

## 2024-07-24 ENCOUNTER — Ambulatory Visit (INDEPENDENT_AMBULATORY_CARE_PROVIDER_SITE_OTHER): Admitting: Neurology

## 2024-07-24 ENCOUNTER — Ambulatory Visit (HOSPITAL_BASED_OUTPATIENT_CLINIC_OR_DEPARTMENT_OTHER): Attending: Neurosurgery | Admitting: Physical Therapy

## 2024-07-24 ENCOUNTER — Encounter (HOSPITAL_BASED_OUTPATIENT_CLINIC_OR_DEPARTMENT_OTHER): Payer: Self-pay | Admitting: Physical Therapy

## 2024-07-24 DIAGNOSIS — G4733 Obstructive sleep apnea (adult) (pediatric): Secondary | ICD-10-CM | POA: Diagnosis not present

## 2024-07-24 DIAGNOSIS — G4730 Sleep apnea, unspecified: Secondary | ICD-10-CM

## 2024-07-24 DIAGNOSIS — I422 Other hypertrophic cardiomyopathy: Secondary | ICD-10-CM

## 2024-07-24 DIAGNOSIS — I5189 Other ill-defined heart diseases: Secondary | ICD-10-CM

## 2024-07-24 DIAGNOSIS — M5459 Other low back pain: Secondary | ICD-10-CM | POA: Diagnosis not present

## 2024-07-24 NOTE — Therapy (Signed)
 OUTPATIENT PHYSICAL THERAPY Treatment   Patient Name: Brian Cortez MRN: [id number] DOB:03/15/1951, 73 y.o., male Today's Date: 07/24/2024  END OF SESSION:  PT End of Session - 07/24/24 1016     Visit Number 4    Number of Visits 7    Date for PT Re-Evaluation 08/25/24    Authorization Type [employer] MCR    Progress Note Due on Visit 10    PT Start Time 1015    PT Stop Time 1057    PT Time Calculation (min) 42 min    Activity Tolerance Patient tolerated treatment well    Behavior During Therapy WFL for tasks assessed/performed          Past Medical History:  Diagnosis Date   Acute pancreatitis    Anxiety    Bladder tumor    Carpal tunnel syndrome, left    intermittant   Chronic seasonal allergic rhinitis    Coronary artery disease    Coronary calcium  score is very high at 1582   DDD (degenerative disc disease), cervical    Diabetes mellitus type 2 in nonobese (HCC)    Dilated cardiomyopathy (HCC)    Diverticulosis of colon    Fecal incontinence    GERD (gastroesophageal reflux disease)    Grade II diastolic dysfunction 06/17/2017   per ECHO    Hemorrhoids    Hiatal hernia    History of adenomatous polyp of colon    2001;  2007;  2012   History of bladder cancer urologist-  dr chales   11-06-2013  s/p TURBT and BCG tx's   History of kidney stones    about 45 years ago   History of panic attacks    History of urethral stricture    Hyperlipidemia    Hypertension    Hypogonadism in male    LVH (left ventricular hypertrophy) 06/17/2017   Mild, noted on ECHO   Nonischemic cardiomyopathy [facility])    cardiologist-  dr rolan-- per last echo 01/ 2016 ef 50-55%  (up from 09/ 2014 ef 45-50%)   OSA on CPAP    since 2014   Pneumonia 11/2010   Left lower lobe   Pulmonary hypertension (HCC) 06/17/2017   Mild, per ECHO   Past Surgical History:  Procedure Laterality Date   CARDIOVASCULAR STRESS TEST  08-21-2013  dr rolan   normal nuclear study w/ no ischemia/   normal LV function and wall motion , ef 63%   CATARACT EXTRACTION W/ INTRAOCULAR LENS  IMPLANT, BILATERAL  2013   COLONOSCOPY  last one 03-03-2016   CYSTO/  DIRECT VISION INTERNAL URETEROTOMY  11/25/2006   LAPAROSCOPIC CHOLECYSTECTOMY  10/15/2006   MENISCUS REPAIR Right 12/2020   ORBITAL FRACTURE SURGERY  1986   left eye:  Jan 1986--- fixation of fractures, repair of optic nerve decompression , and complex closure   TONSILLECTOMY  child   TRANSTHORACIC ECHOCARDIOGRAM  12-16-2014   dr rolan   grade 1 diastolic dysfunction, ef 50-55% (improved from last study in 2014 , previous ef 45-50%) /  trivial MR and TR/  mild LAE/  inferior vena cava dilated, respirophasic diameter changes were blunted (<50%), consistant w/ elevated central venous pressure   TRANSURETHRAL RESECTION OF BLADDER TUMOR Bilateral 05/16/2018   Procedure: TRANSURETHRAL RESECTION OF BLADDER TUMOR (TURBT)/ RETROGRADE;  Surgeon: Nieves Cough, MD;  Location: [facility];  Service: Urology;  Laterality: Bilateral;   TRANSURETHRAL RESECTION OF BLADDER TUMOR WITH GYRUS (TURBT-GYRUS) N/A 11/06/2013   Procedure: TRANSURETHRAL RESECTION OF  BLADDER TUMOR WITH GYRUS (TURBT-GYRUS);  Surgeon: Arlena LILLETTE Gal, MD;  Location: [facility];  Service: Urology;  Laterality: N/A;   TRANSURETHRAL RESECTION OF BLADDER TUMOR WITH MITOMYCIN -C N/A 12/09/2016   Procedure: CYSTOSCOPY TRANSURETHRAL RESECTION OF BLADDER TUMOR WITH MITOMYCIN -C;  Surgeon: Arlena Gal, MD;  Location: [facility];  Service: Urology;  Laterality: N/A;   VARICOSE VEIN SURGERY  2002   VASECTOMY     Patient Active Problem List   Diagnosis Date Noted   MCI (mild cognitive impairment) 05/16/2023   Primary open-angle glaucoma (POAG), moderate stage 05/13/2022   History of vitrectomy 04/08/2022   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   Chronic kidney disease, stage 3b (HCC) 05/15/2021   COVID-19 05/15/2021    Mild cognitive disorder 05/15/2021   Bilateral impacted cerumen 02/13/2021   Deviated septum 02/13/2021   Epistaxis 02/13/2021   Myopic macular degeneration 04/02/2020   Posterior vitreous detachment of right eye 04/02/2020   Early stage nonexudative age-related macular degeneration of both eyes 04/02/2020   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 04/02/2020   Low back pain 03/04/2020   Other specified local infections of the skin and subcutaneous tissue 08/20/2019   Secondary polycythemia 08/06/2019   Atherosclerosis of coronary artery 02/07/2019   Tinea cruris 10/16/2018   Neoplastic malignant related fatigue 09/20/2018   Convalescence after chemotherapy 09/20/2018   Anterior urethral stricture 08/04/2018   Bladder trabeculation 08/04/2018   Diverticula, bladder acquired 08/04/2018   Erectile dysfunction due to diseases classified elsewhere 08/04/2018   Low testosterone  in male 08/04/2018   Primary open angle glaucoma (POAG) of both eyes, moderate stage 02/10/2018   Obesity with body mass index greater than 30 09/14/2017   Incontinence of feces 04/22/2017   Pain in right knee 01/25/2017   Nasal congestion with rhinorrhea 09/08/2016   Chronic seasonal allergic rhinitis due to pollen 09/08/2016   Impaired fasting glucose 12/10/2015   Postherpetic neuralgia 11/12/2015   Herpes zoster without complication 11/04/2015   Acute recurrent ethmoidal sinusitis 09/09/2015   Bronchitis 09/09/2015   OSA on CPAP 09/09/2015   Rhinitis due to pollen 09/09/2015   Plantar fascial fibromatosis 11/25/2014   CAP (community acquired pneumonia) 10/15/2014   Allergic rhinitis 10/15/2014   Atelectasis 09/30/2014   Abnormal weight loss 09/06/2014   Cough 09/06/2014   CPAP rhinitis 08/27/2014   Anxiety disorder 03/15/2014   Colorectal polyps 03/15/2014   Glaucoma 03/15/2014   Screening for depression 03/15/2014   Male hypogonadism 03/15/2014   Other ill-defined heart diseases 03/15/2014    Bladder cancer (HCC) 11/06/2013   Sleep apnea with use of continuous positive airway pressure (CPAP) 08/23/2013   Coronary artery calcification seen on CAT scan 08/17/2013   Cardiomyopathy (HCC) 08/17/2013   Diastolic dysfunction 08/07/2013   Testosterone  deficiency 10/18/2012   CARPAL TUNNEL SYNDROME, LEFT 03/12/2010   Brachial neuritis or radiculitis 03/12/2010   NONSPECIFIC ABNORMAL ELECTROCARDIOGRAM 10/08/2009   ADENOMATOUS COLONIC POLYP 04/06/2008   Diverticulosis of colon 04/06/2008   Diaphragmatic hernia 12/18/2007   HYPERLIPIDEMIA 10/20/2007   Essential hypertension 05/30/2007   G E R D 05/30/2007   Other specified abnormal findings of blood chemistry 12/19/2006     REFERRING PROVIDER:  Cozette Delon Helling, PA-C     REFERRING DIAG:  M54.59 (ICD-10-CM) - Other low back pain  M54.16 (ICD-10-CM) - Radiculopathy, lumbar region  M48.062 (ICD-10-CM) - Spinal stenosis, lumbar region with neurogenic claudication  M47.816 (ICD-10-CM) - Spondylosis without myelopathy or radiculopathy, lumbar region  Rationale for Evaluation and Treatment: Rehabilitation  THERAPY DIAG:  Other low back pain  ONSET DATE: July 2024   SUBJECTIVE:                                                                                                                                                                                           SUBJECTIVE STATEMENT: A little spot on the left side my incision.   EVAL: July 2024 had pancreatitis and weakness in L2-3, 4-5. Went to [facility] where I have done shots in the past & suggested MRI. Referred to neurosurgeon, saw dr mavis. Referred to pain MD- 4 shots twice, first time worked for 2.5 hr and didn't work the second time. Did ablation but with no change. Provided with anti-inflammatories. Tried acupuncture, 4 treatments, started feeling some relief after 3rd & 4th. Chipped 15-18 golf balls, tried full swings with little twinge, that evening was  cooking for church and back started to hurt. Next morning I was in terrible pain, could hardly walk to the bathroom and back. Saw Dr Jama at [facility] who wanted an updated MRI- showed L2-3 deterioration moreso than 4-5. Went to [place], took prednisone which helped until the end of the Rx so started hydrocodone - seemed to do ok. Ran out, woke up Sunday AM and was in terrible pain. Moved to oxycodone , has been on it for 10 days, very little cut of pain. A week ago, tomorrow, [facility] denied sx bc of lack of 6 wk of preop PT.  Was playing golf, reformer pilates 3d/wk, 1/2 hr Trx with a trainer and 45 min on Fridays. Has stopped due to back pain.  H/o bladder CAx2. Currently taking 600 mg gabapentin TID and oxy 1 every 4 hr.  Get up bw 530-630, pain 8-8.5/10. Rt side pain wraps around lateral hip and up to iliac crest. Laying on either side hurts. Denies pain into legs. Lifting weight can create pain on lateral right hip to iliac crest.   PERTINENT HISTORY:  CKD, h/o bladder CA  PAIN:  Are you having pain? Yes: NPRS scale: mild right now Pain location: lower back to Rt lateral hip, proximal to Gr trochanter to iliac crest Pain description: severe  Aggravating factors: laying on either side, AM pain, lifting items, flexion to floor Relieving factors: pain meds a little  PRECAUTIONS:  None  RED FLAGS: None   WEIGHT BEARING RESTRICTIONS:  No  FALLS:  Has patient fallen in last 6 months? No  OCCUPATION:  retired  PLOF:  Independent  PATIENT GOALS:  Back to golf, travel, decrease pain   OBJECTIVE:  Note: Objective measures were completed  at Evaluation unless otherwise noted.  DIAGNOSTIC FINDINGS:  CT Lumbar 05/30/23: FINDINGS: Segmentation: 5 lumbar type vertebrae.   Alignment: Normal.   Vertebrae: Inferior endplate Schmorl's nodes at L2 and L3 are unchanged from prior. New superior endplate Schmorl's node at L3. No acute fracture. No suspicious bone lesion.   Paraspinal and other soft  tissues: No paraspinal inflammatory changes.   Disc levels: Mild disc height loss at L2-3 and L3-4. Mild multilevel facet arthropathy.   IMPRESSION: 1. No acute fracture or traumatic malalignment of the lumbar spine. 2. Mild degenerative changes of the lumbar spine.    PATIENT SURVEYS:  Modified Oswestry 27/50= 54%  8/19 Mod oswestry 7/50 =14%  COGNITIVE STATUS: Per MD note 12/15/23: diagnosed with a mild cognitive disorder with memory loss    SENSATION: WFL  POSTURE:  rounded shoulders, decreased lumbar lordosis, and increased thoracic kyphosis, posterior pelvic tilt, lacking full knee extension bilat in standing <10 deg   Body Part #1 Lumbar  PALPATION: Eval: no recreation of concordant pain with central and lateral PA or SIJ PA mobs  Tightness in Rt lumbar paraspinals, QL and glut med   EVAL ROM: no distal symptoms with movement, some midline discomfort with extension, Full ROM available   Notable lack of hip extension in Rt hip during golf follow through- concordant pain in hip recreated  EVAL MMT- Rt hip flexion & abd 5/5                                                                                                                             TREATMENT DATE:   9/2 IASTM & STM lumbar paraspinals & QL on Lt Scar mobilization- IASTM, skin rolling Prone alt hip ext Prone alt UE ext Prone swimmer Cat/cow/child pose Shuttle supine 3*25 & sidelying press 2*25 Green tband straight arm pull down- cues for core/glut activation   8/26 Seated anchored, row, horiz abd flexion green tband Seated GHJ ER, alternating punch green tband Primal push up- elbow plank on toes- on knees Tall kneel hip hinge+ glut set Prone alt LE ext, UE/LE opp ext  EVAL 05/08/24: Discussed return to pilates & things to avoid for now- provided with number to connect with instructors Discussed progressive return to golf Hip hinge with dowel for form   PATIENT EDUCATION:  Education details:  Anatomy of condition, POC, HEP, exercise form/rationale Person educated: Patient Education method: Explanation, Demonstration, Tactile cues, and Verbal cues Education comprehension: verbalized understanding, returned demonstration, verbal cues required, tactile cues required, and needs further education  HOME EXERCISE PROGRAM: Return to Land class, gentle AM stretches & glut set to stand out of bed   ASSESSMENT:  CLINICAL IMPRESSION: Tendency toward forward pelvis when upright and performing glut set- will benefit from coordination training of core/glut balance as pt was forward flexed prior to surgery. Lt floating rib concordant pain source and reduced with manual therapy today.    EVAL: Patient is a 73 y.o. M  who was seen today for physical therapy evaluation and treatment for chronic, worsening low back pain that wraps to Rt lateral hip. Pt does present with tightness in glutes and TFL. Was unable to recreate concordant pain with SLR test or joint mobilizations. Pt plans to return to neurosurgeon for surgery on lumbar spine at L2-3 but insurance is requiring PT. Pt will return to pilates reformer classes in a gentle manner as he has been doing it for a long time. Will also try gentle mobility exercises to reduce the severe AM pain.     OBJECTIVE IMPAIRMENTS: decreased activity tolerance, increased muscle spasms, impaired flexibility, postural dysfunction, and pain.   ACTIVITY LIMITATIONS: lifting, bending, sitting, sleeping, and recreation/fitness  PARTICIPATION LIMITATIONS: meal prep, cleaning, laundry, and community activity  PERSONAL FACTORS: see PMH are also affecting patient's functional outcome.   REHAB POTENTIAL: Fair planning surgery  CLINICAL DECISION MAKING: Evolving/moderate complexity  EVALUATION COMPLEXITY: Moderate   GOALS: Goals reviewed with patient? Yes  SHORT TERM GOALS: Target date: 6/28  Verbalize compliance with AM stretches and  mobility Baseline: discussed at eval Goal status: met    LONG TERM GOALS: Target date:  POC date  Performing pilates exercises without increased pain Baseline: just started back yesterday Goal status: ongoing 8/19  2.  Improve flexibility to allow full knee extension bilaterally in standing Baseline:  Goal status: Met 8/19  3.  Verbalize reduction in AM spasm Baseline: 95% improved Goal status: ongoing 8/19  4. Get back to playing golf  Baseline  Goal status: new     PLAN:  PT FREQUENCY: 1x/week  PT DURATION: 6 weeks  PLANNED INTERVENTIONS: 97164- PT Re-evaluation, 97750- Physical Performance Testing, 97110-Therapeutic exercises, 97530- Therapeutic activity, 97112- Neuromuscular re-education, 97535- Self Care, 02859- Manual therapy, [phone]- Gait training, [phone]- Aquatic Therapy, [phone] (1-2 muscles), 20561 (3+ muscles)- Dry Needling, Patient/Family education, Balance training, Stair training, Taping, Joint mobilization, Spinal mobilization, Cryotherapy, and Moist heat.  PLAN FOR NEXT SESSION: Manual PRN, core stability Harlene Cordon, PT, DPT 07/24/2024, 10:58 AM

## 2024-07-25 DIAGNOSIS — E291 Testicular hypofunction: Secondary | ICD-10-CM | POA: Diagnosis not present

## 2024-07-25 NOTE — Progress Notes (Signed)
 [facility]  Brian Cortez 73 year old male [birth year]   HOME SLEEP TEST REPORT ( by Watch PAT)   STUDY DATE:  07-24-2024    ORDERING CLINICIAN:  Dedra Gores, MD  REFERRING CLINICIAN:  Dr Janey   CLINICAL INFORMATION/HISTORY: Brian Cortez is a 73 y.o. male patient , a retired Psychologist, occupational who is followed here as a  sleep patient , seen for revisit 06/28/2024.  Chief concern according to patient :  I lost weight, had a surgery 21 days ago, (L2-3 fusion surgery by Dr Tanda, [facility]) . Pain has improved , right flank to hip. I am limited with driving longer distances, I am restrcied to carry 10 pounds limit. I can walk stairs  , Sleeping well , needs a new CPAP.    DR Corina has given him a clean bill of cognitive function.  Bladder cancer survivor.  OSA on CPAP . CPAP was not newly  set up after the pandemic, this machine is over 68 years old.   He is happy to comply with CPAP retesting.     Epworth sleepiness score: 2 FSS 23 / 63  GDS 0/ 15    BMI:  30.6 kg/m   Neck Circumference: 19    FINDINGS:   Sleep Summary:   Total Recording Time (hours, min): 8 hours 2 minutes      Total Sleep Time (hours, min):    7 hours 6 minutes             Percent REM (%):    7%                                    Respiratory Indices:   Calculated pAHI (per CMS guideline): 7.4/h                         REM pAHI: 0                                               NREM pAHI:   8/h                           Positional AHI: During supine sleep the AHI for this patient was 7.2/h and in non-REM supine sleep 5.7/h.  Snoring showed a mean volume of 41 dB and was present for 60% of the total recorded time. Snoring:                                                Oxygen Saturation Statistics:   Oxygen Saturation (%) Mean: 93%              O2 Saturation Range (%):   Between 88 and 100%                                    O2 Saturation (minutes) <89%:    0.3 minutes        Pulse Rate  Statistics:   Pulse Mean (bpm):   60 bpm              Pulse Range:    Between 51 and 81 bpm, limited range             IMPRESSION:  This HST confirms the presence of mild all obstructive sleep apnea.  There was not enough REM sleep seen to have a valid AHI for REM sleep only but it is important to note that the AHI was similar in supine sleep and nonsupine sleep overall.  This sleep apnea is mild enough to declare CPAP optional if the patient decides not to continue with it.  If he likes to continue with CPAP I will order an auto titration ResMed CPAP device with reduced settings as his need for pressure seems to have been decreasing.   RECOMMENDATION: This sleep apnea is mild enough to declare CPAP optional if the patient decides not to continue with it.  If he likes to continue with CPAP I will order an auto titration ResMed CPAP device with reduced settings as his need for pressure seems to have been decreasing.   General : Any patient should be cautioned not to drive, work at heights, or operate dangerous or heavy equipment when tired or sleepy.   Review of good sleep hygiene measures is accessible to any sleep clinic patient and can be reiterated through online material- I we recommend the Guide to better Sleep   by the NIH.   Weight loss and Core Strength improvement is highly recommended for individuals with low muscle tone and/ or a BMI over 30.  Any CPAP patient should be reminded to be fully compliant with PAP therapy , (defined as using PAP therapy for more than 4 hours each night ) with the goal to improve sleep related symptoms and decrease long term cardiovascular risks.    The referring physician will be notified of the test results.       INTERPRETING PHYSICIAN:  Dedra Gores, MD  [facility] and [facility] Sleep Board certified by The [employer] of Sleep Medicine and Diplomate of the [employer] of Sleep  Medicine. Board certified In Neurology through the ABPN, Fellow of the [employer] of Neurology.

## 2024-07-30 ENCOUNTER — Ambulatory Visit: Payer: Self-pay | Admitting: Neurology

## 2024-07-30 NOTE — Procedures (Signed)
 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  Chief concern according to patient :  I lost weight, had a surgery 21 days ago, (L2-3 fusion surgery by Dr Tanda, [facility]) . Pain has improved , right flank to hip. I am limited with driving longer distances, I am restrcied to carry 10 pounds limit. I can walk stairs  , Sleeping well , needs a new CPAP.    DR Corina has given him a clean bill of cognitive function.  Bladder cancer survivor.  OSA on CPAP . CPAP was not newly  set up after the pandemic, this machine is over 39 years old.   He is happy to comply with CPAP retesting.     Epworth sleepiness score: 2 FSS 23 / 63  GDS 0/ 15    BMI:  30.6 kg/m   Neck Circumference: 19    FINDINGS:   Sleep Summary:   Total Recording Time (hours, min): 8 hours 2 minutes      Total Sleep Time (hours, min):    7 hours 6 minutes             Percent REM (%):    7%                                    Respiratory Indices:   Calculated pAHI (per CMS guideline): 7.4/h                         REM pAHI: 0                                               NREM pAHI:   8/h                           Positional AHI: During supine sleep the AHI for this patient was 7.2/h and in non-REM supine sleep 5.7/h.  Snoring showed a mean volume of 41 dB and was present for 60% of the total recorded time. Snoring:                                                Oxygen Saturation Statistics:   Oxygen Saturation (%) Mean: 93%              O2 Saturation Range (%):   Between 88 and 100%                                    O2 Saturation (minutes) <89%:   0.3  minutes        Pulse Rate Statistics:   Pulse  Mean (bpm):   60 bpm              Pulse Range:    Between 51 and 81 bpm, limited range             IMPRESSION:  This HST confirms the presence of mild all obstructive sleep apnea.  There was not enough REM sleep seen to have a valid AHI for REM sleep only but it is important to note that the AHI was similar in supine sleep and nonsupine sleep overall.  This sleep apnea is mild enough to declare CPAP optional if the patient decides not to continue with it.  If he likes to continue with CPAP I will order an auto titration ResMed CPAP device with reduced settings as his need for pressure seems to have been decreasing.   RECOMMENDATION: This sleep apnea is mild enough to declare CPAP optional if the patient decides not to continue with it.  If he likes to continue with CPAP I will order an auto titration ResMed CPAP device with reduced settings as his need for pressure seems to have been decreasing.   General : Any patient should be cautioned not to drive, work at heights, or operate dangerous or heavy equipment when tired or sleepy.   Review of good sleep hygiene measures is accessible to any sleep clinic patient and can be reiterated through online material- I we recommend the Guide to better Sleep   by the NIH.   Weight loss and Core Strength improvement is highly recommended for individuals with low muscle tone and/ or a BMI over 30.  Any CPAP patient should be reminded to be fully compliant with PAP therapy , (defined as using PAP therapy for more than 4 hours each night ) with the goal to improve sleep related symptoms and decrease long term cardiovascular risks.    The referring physician will be notified of the test results.       INTERPRETING PHYSICIAN:  Dedra Gores, MD  [facility] and [facility] Sleep Board certified by The [employer] of Sleep Medicine and Diplomate of the [employer] of Sleep  Medicine. Board certified In Neurology through the ABPN, Fellow of the [employer] of Neurology.

## 2024-07-31 ENCOUNTER — Encounter (HOSPITAL_BASED_OUTPATIENT_CLINIC_OR_DEPARTMENT_OTHER): Payer: Self-pay | Admitting: Physical Therapy

## 2024-07-31 ENCOUNTER — Encounter: Payer: Self-pay | Admitting: Neurology

## 2024-07-31 ENCOUNTER — Ambulatory Visit (HOSPITAL_BASED_OUTPATIENT_CLINIC_OR_DEPARTMENT_OTHER): Admitting: Physical Therapy

## 2024-07-31 DIAGNOSIS — M5459 Other low back pain: Secondary | ICD-10-CM

## 2024-07-31 NOTE — Therapy (Signed)
 OUTPATIENT PHYSICAL THERAPY Treatment   Patient Name: Brian Cortez MRN: [id number] DOB:[birth year], 73 y.o., male Today's Date: 07/31/2024  END OF SESSION:  PT End of Session - 07/31/24 1027     Visit Number 5    Number of Visits 7    Date for PT Re-Evaluation 08/25/24    Authorization Type [employer] MCR    Progress Note Due on Visit 10    PT Start Time 1018    PT Stop Time 1058    PT Time Calculation (min) 40 min    Activity Tolerance Patient tolerated treatment well    Behavior During Therapy WFL for tasks assessed/performed          Past Medical History:  Diagnosis Date   Acute pancreatitis    Anxiety    Bladder tumor    Carpal tunnel syndrome, left    intermittant   Chronic seasonal allergic rhinitis    Coronary artery disease    Coronary calcium  score is very high at 1582   DDD (degenerative disc disease), cervical    Diabetes mellitus type 2 in nonobese (HCC)    Dilated cardiomyopathy (HCC)    Diverticulosis of colon    Fecal incontinence    GERD (gastroesophageal reflux disease)    Grade II diastolic dysfunction 06/17/2017   per ECHO    Hemorrhoids    Hiatal hernia    History of adenomatous polyp of colon    2001;  2007;  2012   History of bladder cancer urologist-  dr chales   11-06-2013  s/p TURBT and BCG tx's   History of kidney stones    about 45 years ago   History of panic attacks    History of urethral stricture    Hyperlipidemia    Hypertension    Hypogonadism in male    LVH (left ventricular hypertrophy) 06/17/2017   Mild, noted on ECHO   Nonischemic cardiomyopathy [facility])    cardiologist-  dr rolan-- per last echo 01/ 2016 ef 50-55%  (up from 09/ 2014 ef 45-50%)   OSA on CPAP    since 2014   Pneumonia 11/2010   Left lower lobe   Pulmonary hypertension (HCC) 06/17/2017   Mild, per ECHO   Past Surgical History:  Procedure Laterality Date   CARDIOVASCULAR STRESS TEST  08-21-2013  dr rolan   normal nuclear study w/ no ischemia/   normal LV function and wall motion , ef 63%   CATARACT EXTRACTION W/ INTRAOCULAR LENS  IMPLANT, BILATERAL  2013   COLONOSCOPY  last one 03-03-2016   CYSTO/  DIRECT VISION INTERNAL URETEROTOMY  11/25/2006   LAPAROSCOPIC CHOLECYSTECTOMY  10/15/2006   MENISCUS REPAIR Right 12/2020   ORBITAL FRACTURE SURGERY  1986   left eye:  Jan 1986--- fixation of fractures, repair of optic nerve decompression , and complex closure   TONSILLECTOMY  child   TRANSTHORACIC ECHOCARDIOGRAM  12-16-2014   dr rolan   grade 1 diastolic dysfunction, ef 50-55% (improved from last study in 2014 , previous ef 45-50%) /  trivial MR and TR/  mild LAE/  inferior vena cava dilated, respirophasic diameter changes were blunted (<50%), consistant w/ elevated central venous pressure   TRANSURETHRAL RESECTION OF BLADDER TUMOR Bilateral 05/16/2018   Procedure: TRANSURETHRAL RESECTION OF BLADDER TUMOR (TURBT)/ RETROGRADE;  Surgeon: Nieves Cough, MD;  [facility] ;  Service: Urology;  Laterality: Bilateral;   TRANSURETHRAL RESECTION OF BLADDER TUMOR WITH GYRUS (TURBT-GYRUS) N/A 11/06/2013   Procedure: TRANSURETHRAL RESECTION OF  BLADDER TUMOR WITH GYRUS (TURBT-GYRUS);  Surgeon: Arlena LILLETTE Gal, MD;  Location: [facility];  Service: Urology;  Laterality: N/A;   TRANSURETHRAL RESECTION OF BLADDER TUMOR WITH MITOMYCIN -C N/A 12/09/2016   Procedure: CYSTOSCOPY TRANSURETHRAL RESECTION OF BLADDER TUMOR WITH MITOMYCIN -C;  Surgeon: Arlena Gal, MD;  Location: [facility];  Service: Urology;  Laterality: N/A;   VARICOSE VEIN SURGERY  2002   VASECTOMY     Patient Active Problem List   Diagnosis Date Noted   MCI (mild cognitive impairment) 05/16/2023   Primary open-angle glaucoma (POAG), moderate stage 05/13/2022   History of vitrectomy 04/08/2022   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   Chronic kidney disease, stage 3b (HCC) 05/15/2021   COVID-19 05/15/2021    Mild cognitive disorder 05/15/2021   Bilateral impacted cerumen 02/13/2021   Deviated septum 02/13/2021   Epistaxis 02/13/2021   Myopic macular degeneration 04/02/2020   Posterior vitreous detachment of right eye 04/02/2020   Early stage nonexudative age-related macular degeneration of both eyes 04/02/2020   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 04/02/2020   Low back pain 03/04/2020   Other specified local infections of the skin and subcutaneous tissue 08/20/2019   Secondary polycythemia 08/06/2019   Atherosclerosis of coronary artery 02/07/2019   Tinea cruris 10/16/2018   Neoplastic malignant related fatigue 09/20/2018   Convalescence after chemotherapy 09/20/2018   Anterior urethral stricture 08/04/2018   Bladder trabeculation 08/04/2018   Diverticula, bladder acquired 08/04/2018   Erectile dysfunction due to diseases classified elsewhere 08/04/2018   Low testosterone  in male 08/04/2018   Primary open angle glaucoma (POAG) of both eyes, moderate stage 02/10/2018   Obesity with body mass index greater than 30 09/14/2017   Incontinence of feces 04/22/2017   Pain in right knee 01/25/2017   Nasal congestion with rhinorrhea 09/08/2016   Chronic seasonal allergic rhinitis due to pollen 09/08/2016   Impaired fasting glucose 12/10/2015   Postherpetic neuralgia 11/12/2015   Herpes zoster without complication 11/04/2015   Acute recurrent ethmoidal sinusitis 09/09/2015   Bronchitis 09/09/2015   OSA on CPAP 09/09/2015   Rhinitis due to pollen 09/09/2015   Plantar fascial fibromatosis 11/25/2014   CAP (community acquired pneumonia) 10/15/2014   Allergic rhinitis 10/15/2014   Atelectasis 09/30/2014   Abnormal weight loss 09/06/2014   Cough 09/06/2014   CPAP rhinitis 08/27/2014   Anxiety disorder 03/15/2014   Colorectal polyps 03/15/2014   Glaucoma 03/15/2014   Screening for depression 03/15/2014   Male hypogonadism 03/15/2014   Other ill-defined heart diseases 03/15/2014    Bladder cancer (HCC) 11/06/2013   Sleep apnea with use of continuous positive airway pressure (CPAP) 08/23/2013   Coronary artery calcification seen on CAT scan 08/17/2013   Cardiomyopathy (HCC) 08/17/2013   Diastolic dysfunction 08/07/2013   Testosterone  deficiency 10/18/2012   CARPAL TUNNEL SYNDROME, LEFT 03/12/2010   Brachial neuritis or radiculitis 03/12/2010   NONSPECIFIC ABNORMAL ELECTROCARDIOGRAM 10/08/2009   ADENOMATOUS COLONIC POLYP 04/06/2008   Diverticulosis of colon 04/06/2008   Diaphragmatic hernia 12/18/2007   HYPERLIPIDEMIA 10/20/2007   Essential hypertension 05/30/2007   G E R D 05/30/2007   Other specified abnormal findings of blood chemistry 12/19/2006     REFERRING PROVIDER:  Cozette Delon Helling, PA-C     REFERRING DIAG:  M54.59 (ICD-10-CM) - Other low back pain  M54.16 (ICD-10-CM) - Radiculopathy, lumbar region  M48.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  I just want that spot in my back to go away and play golf as my primary goals. Seeing surgeon tomorrow.   EVAL: July 2024 had pancreatitis and weakness in L2-3, 4-5. Went to [facility] where I have done shots in the past & suggested MRI. Referred to neurosurgeon, saw dr mavis. Referred to pain MD- 4 shots twice, first time worked for 2.5 hr and didn't work the second time. Did ablation but with no change. Provided with anti-inflammatories. Tried acupuncture, 4 treatments, started feeling some relief after 3rd & 4th. Chipped  15-18 golf balls, tried full swings with little twinge, that evening was cooking for church and back started to hurt. Next morning I was in terrible pain, could hardly walk to the bathroom and back. Saw Dr Jama at [facility] who wanted an updated MRI- showed L2-3 deterioration moreso than 4-5. Went to [place], took prednisone which helped until the end of the Rx so started hydrocodone - seemed to do ok. Ran out, woke up Sunday AM and was in terrible pain. Moved to oxycodone , has been on it for 10 days, very little cut of pain. A week ago, tomorrow, [facility] denied sx bc of lack of 6 wk of preop PT.  Was playing golf, reformer pilates 3d/wk, 1/2 hr Trx with a trainer and 45 min on Fridays. Has stopped due to back pain.  H/o bladder CAx2. Currently taking 600 mg gabapentin TID and oxy 1 every 4 hr.  Get up bw 530-630, pain 8-8.5/10. Rt side pain wraps around lateral hip and up to iliac crest. Laying on either side hurts. Denies pain into legs. Lifting weight can create pain on lateral right hip to iliac crest.   PERTINENT HISTORY:  CKD, h/o bladder CA  PAIN:  Are you having pain? Yes: NPRS scale: mild right now Pain location: lower back to Rt lateral hip, proximal to Gr trochanter to iliac crest Pain description: severe  Aggravating factors: laying on either side, AM pain, lifting items, flexion to floor Relieving factors: pain meds a little  PRECAUTIONS:  None  RED FLAGS: None   WEIGHT BEARING RESTRICTIONS:  No  FALLS:  Has patient fallen in last 6 months? No  OCCUPATION:  retired  PLOF:  Independent  PATIENT GOALS:  Back to golf, travel, decrease pain   OBJECTIVE:  Note: Objective measures were completed at Evaluation unless otherwise noted.  DIAGNOSTIC FINDINGS:  CT Lumbar 05/30/23: FINDINGS: Segmentation: 5 lumbar type vertebrae.   Alignment: Normal.   Vertebrae: Inferior endplate Schmorl's nodes at L2 and L3 are unchanged from prior. New superior endplate Schmorl's node at L3.  No acute fracture. No suspicious bone lesion.   Paraspinal and other soft tissues: No paraspinal inflammatory changes.   Disc levels: Mild disc height loss at L2-3 and L3-4. Mild multilevel facet arthropathy.   IMPRESSION: 1. No acute fracture or traumatic malalignment of the lumbar spine. 2. Mild degenerative changes of the lumbar spine.    PATIENT SURVEYS:  Modified Oswestry 27/50= 54%  8/19 Mod oswestry 7/50 =14%  COGNITIVE STATUS: Per MD note 12/15/23: diagnosed with a mild cognitive disorder with memory loss    SENSATION: WFL  POSTURE:  rounded shoulders, decreased lumbar lordosis, and increased thoracic kyphosis, posterior pelvic tilt, lacking full knee extension bilat in standing <10 deg   Body Part #1 Lumbar  PALPATION: Eval: no recreation of concordant pain with central and lateral PA or SIJ PA mobs  Tightness in Rt lumbar paraspinals, QL and glut med   EVAL ROM: no distal symptoms with movement, some midline discomfort with extension, Full ROM available   Notable lack of hip extension in Rt hip during golf follow through- concordant pain in hip recreated  EVAL MMT- Rt hip flexion & abd 5/5                                                                                                                             TREATMENT DATE:   9/9 Reformer foot work [id number] Iso hamstring press down with add using ball Iso hamstring press into bar- hands in straps press down; also done with single leg TT Plank hands on bar- hip & shoulder dissociation  Tall kneel hands in straps  9/2 IASTM & STM lumbar paraspinals & QL on Lt Scar mobilization- IASTM, skin rolling Prone alt hip ext Prone alt UE ext Prone swimmer Cat/cow/child pose Shuttle supine 3*25 & sidelying press 2*25 Green tband straight arm pull down- cues for core/glut activation   8/26 Seated anchored, row, horiz abd flexion green tband Seated GHJ ER, alternating punch green tband Primal push up- elbow  plank on toes- on knees Tall kneel hip hinge+ glut set Prone alt LE ext, UE/LE opp ext  EVAL 05/08/24: Discussed return to pilates & things to avoid for now- provided with number to connect with instructors Discussed progressive return to golf Hip hinge with dowel for form   PATIENT EDUCATION:  Education details: Anatomy of condition, POC, HEP, exercise form/rationale Person educated: Patient Education method: Explanation, Demonstration, Tactile cues, and Verbal cues Education comprehension: verbalized understanding, returned demonstration, verbal cues required, tactile cues required, and needs further education  HOME EXERCISE PROGRAM: Return to Land class, gentle AM stretches & glut set  to stand out of bed   ASSESSMENT:  CLINICAL IMPRESSION: Excellent tolerance to reformer challenges, utilized light resistance with slow movement for coordinated control. Did have difficulty maintaining upright posture in tall kneeling, facing straps when perturbations were present. Asked that he continue progressing with pilates instructor and start putting/small range swing chipping. Will see MD tomorrow for clearance to progress into larger ranges.    EVAL: Patient is a 73 y.o. M who was seen today for physical therapy evaluation and treatment for chronic, worsening low back pain that wraps to Rt lateral hip. Pt does present with tightness in glutes and TFL. Was unable to recreate concordant pain with SLR test or joint mobilizations. Pt plans to return to neurosurgeon for surgery on lumbar spine at L2-3 but insurance is requiring PT. Pt will return to pilates reformer classes in a gentle manner as he has been doing it for a long time. Will also try gentle mobility exercises to reduce the severe AM pain.     OBJECTIVE IMPAIRMENTS: decreased activity tolerance, increased muscle spasms, impaired flexibility, postural dysfunction, and pain.   ACTIVITY LIMITATIONS: lifting, bending, sitting,  sleeping, and recreation/fitness  PARTICIPATION LIMITATIONS: meal prep, cleaning, laundry, and community activity  PERSONAL FACTORS: see PMH are also affecting patient's functional outcome.   REHAB POTENTIAL: Fair planning surgery  CLINICAL DECISION MAKING: Evolving/moderate complexity  EVALUATION COMPLEXITY: Moderate   GOALS: Goals reviewed with patient? Yes  SHORT TERM GOALS: Target date: 6/28  Verbalize compliance with AM stretches and mobility Baseline: discussed at eval Goal status: met    LONG TERM GOALS: Target date:  POC date  Performing pilates exercises without increased pain Baseline: just started back yesterday Goal status: ongoing 8/19  2.  Improve flexibility to allow full knee extension bilaterally in standing Baseline:  Goal status: Met 8/19  3.  Verbalize reduction in AM spasm Baseline: 95% improved Goal status: ongoing 8/19  4. Get back to playing golf  Baseline  Goal status: new     PLAN:  PT FREQUENCY: 1x/week  PT DURATION: 6 weeks  PLANNED INTERVENTIONS: [id number]- PT Re-evaluation, 97750- Physical Performance Testing, 97110-Therapeutic exercises, 97530- Therapeutic activity, 97112- Neuromuscular re-education, 97535- Self Care, [id number]- Manual therapy, [phone]- Gait training, [phone]- Aquatic Therapy, [phone] (1-2 muscles), [phone] (3+ muscles)- Dry Needling, Patient/Family education, Balance training, Stair training, Taping, Joint mobilization, Spinal mobilization, Cryotherapy, and Moist heat.  PLAN FOR NEXT SESSION: Manual PRN, core stability Harlene Cordon, PT, DPT 07/31/2024, 4:14 PM

## 2024-08-07 ENCOUNTER — Ambulatory Visit (HOSPITAL_BASED_OUTPATIENT_CLINIC_OR_DEPARTMENT_OTHER): Admitting: Physical Therapy

## 2024-08-07 ENCOUNTER — Encounter (HOSPITAL_BASED_OUTPATIENT_CLINIC_OR_DEPARTMENT_OTHER): Payer: Self-pay | Admitting: Physical Therapy

## 2024-08-07 DIAGNOSIS — M5459 Other low back pain: Secondary | ICD-10-CM | POA: Diagnosis not present

## 2024-08-07 NOTE — Therapy (Signed)
 OUTPATIENT PHYSICAL THERAPY Treatment   Patient Name: Brian Cortez MRN: [id number] DOB:1951-05-12, 73 y.o., male Today's Date: 08/07/2024  END OF SESSION:  PT End of Session - 08/07/24 1018     Visit Number 6    Number of Visits 7    Date for PT Re-Evaluation 08/25/24    Authorization Type [employer] MCR    Progress Note Due on Visit 10    PT Start Time 1017    PT Stop Time 1048    PT Time Calculation (min) 31 min    Activity Tolerance Patient tolerated treatment well    Behavior During Therapy WFL for tasks assessed/performed          Past Medical History:  Diagnosis Date   Acute pancreatitis    Anxiety    Bladder tumor    Carpal tunnel syndrome, left    intermittant   Chronic seasonal allergic rhinitis    Coronary artery disease    Coronary calcium  score is very high at 1582   DDD (degenerative disc disease), cervical    Diabetes mellitus type 2 in nonobese (HCC)    Dilated cardiomyopathy (HCC)    Diverticulosis of colon    Fecal incontinence    GERD (gastroesophageal reflux disease)    Grade II diastolic dysfunction 06/17/2017   per ECHO    Hemorrhoids    Hiatal hernia    History of adenomatous polyp of colon    2001;  2007;  2012   History of bladder cancer urologist-  dr chales   11-06-2013  s/p TURBT and BCG tx's   History of kidney stones    about 45 years ago   History of panic attacks    History of urethral stricture    Hyperlipidemia    Hypertension    Hypogonadism in male    LVH (left ventricular hypertrophy) 06/17/2017   Mild, noted on ECHO   Nonischemic cardiomyopathy [facility])    cardiologist-  dr rolan-- per last echo 01/ 2016 ef 50-55%  (up from 09/ 2014 ef 45-50%)   OSA on CPAP    since 2014   Pneumonia 11/2010   Left lower lobe   Pulmonary hypertension (HCC) 06/17/2017   Mild, per ECHO   Past Surgical History:  Procedure Laterality Date   CARDIOVASCULAR STRESS TEST  08-21-2013  dr rolan   normal nuclear study w/ no ischemia/   normal LV function and wall motion , ef 63%   CATARACT EXTRACTION W/ INTRAOCULAR LENS  IMPLANT, BILATERAL  2013   COLONOSCOPY  last one 03-03-2016   CYSTO/  DIRECT VISION INTERNAL URETEROTOMY  11/25/2006   LAPAROSCOPIC CHOLECYSTECTOMY  10/15/2006   MENISCUS REPAIR Right 12/2020   ORBITAL FRACTURE SURGERY  1986   left eye:  Jan 1986--- fixation of fractures, repair of optic nerve decompression , and complex closure   TONSILLECTOMY  child   TRANSTHORACIC ECHOCARDIOGRAM  12-16-2014   dr rolan   grade 1 diastolic dysfunction, ef 50-55% (improved from last study in 2014 , previous ef 45-50%) /  trivial MR and TR/  mild LAE/  inferior vena cava dilated, respirophasic diameter changes were blunted (<50%), consistant w/ elevated central venous pressure   TRANSURETHRAL RESECTION OF BLADDER TUMOR Bilateral 05/16/2018   Procedure: TRANSURETHRAL RESECTION OF BLADDER TUMOR (TURBT)/ RETROGRADE;  Surgeon: Nieves Cough, MD;  Location: [facility];  Service: Urology;  Laterality: Bilateral;   TRANSURETHRAL RESECTION OF BLADDER TUMOR WITH GYRUS (TURBT-GYRUS) N/A 11/06/2013   Procedure: TRANSURETHRAL RESECTION OF  BLADDER TUMOR WITH GYRUS (TURBT-GYRUS);  Surgeon: Arlena LILLETTE Gal, MD;  Location: [facility];  Service: Urology;  Laterality: N/A;   TRANSURETHRAL RESECTION OF BLADDER TUMOR WITH MITOMYCIN -C N/A 12/09/2016   Procedure: CYSTOSCOPY TRANSURETHRAL RESECTION OF BLADDER TUMOR WITH MITOMYCIN -C;  Surgeon: Arlena Gal, MD;  Location: [facility];  Service: Urology;  Laterality: N/A;   VARICOSE VEIN SURGERY  2002   VASECTOMY     Patient Active Problem List   Diagnosis Date Noted   MCI (mild cognitive impairment) 05/16/2023   Primary open-angle glaucoma (POAG), moderate stage 05/13/2022   History of vitrectomy 04/08/2022   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   Chronic kidney disease, stage 3b (HCC) 05/15/2021   COVID-19 05/15/2021    Mild cognitive disorder 05/15/2021   Bilateral impacted cerumen 02/13/2021   Deviated septum 02/13/2021   Epistaxis 02/13/2021   Myopic macular degeneration 04/02/2020   Posterior vitreous detachment of right eye 04/02/2020   Early stage nonexudative age-related macular degeneration of both eyes 04/02/2020   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 04/02/2020   Low back pain 03/04/2020   Other specified local infections of the skin and subcutaneous tissue 08/20/2019   Secondary polycythemia 08/06/2019   Atherosclerosis of coronary artery 02/07/2019   Tinea cruris 10/16/2018   Neoplastic malignant related fatigue 09/20/2018   Convalescence after chemotherapy 09/20/2018   Anterior urethral stricture 08/04/2018   Bladder trabeculation 08/04/2018   Diverticula, bladder acquired 08/04/2018   Erectile dysfunction due to diseases classified elsewhere 08/04/2018   Low testosterone  in male 08/04/2018   Primary open angle glaucoma (POAG) of both eyes, moderate stage 02/10/2018   Obesity with body mass index greater than 30 09/14/2017   Incontinence of feces 04/22/2017   Pain in right knee 01/25/2017   Nasal congestion with rhinorrhea 09/08/2016   Chronic seasonal allergic rhinitis due to pollen 09/08/2016   Impaired fasting glucose 12/10/2015   Postherpetic neuralgia 11/12/2015   Herpes zoster without complication 11/04/2015   Acute recurrent ethmoidal sinusitis 09/09/2015   Bronchitis 09/09/2015   OSA on CPAP 09/09/2015   Rhinitis due to pollen 09/09/2015   Plantar fascial fibromatosis 11/25/2014   CAP (community acquired pneumonia) 10/15/2014   Allergic rhinitis 10/15/2014   Atelectasis 09/30/2014   Abnormal weight loss 09/06/2014   Cough 09/06/2014   CPAP rhinitis 08/27/2014   Anxiety disorder 03/15/2014   Colorectal polyps 03/15/2014   Glaucoma 03/15/2014   Screening for depression 03/15/2014   Male hypogonadism 03/15/2014   Other ill-defined heart diseases 03/15/2014    Bladder cancer (HCC) 11/06/2013   Sleep apnea with use of continuous positive airway pressure (CPAP) 08/23/2013   Coronary artery calcification seen on CAT scan 08/17/2013   Cardiomyopathy (HCC) 08/17/2013   Diastolic dysfunction 08/07/2013   Testosterone  deficiency 10/18/2012   CARPAL TUNNEL SYNDROME, LEFT 03/12/2010   Brachial neuritis or radiculitis 03/12/2010   NONSPECIFIC ABNORMAL ELECTROCARDIOGRAM 10/08/2009   ADENOMATOUS COLONIC POLYP 04/06/2008   Diverticulosis of colon 04/06/2008   Diaphragmatic hernia 12/18/2007   HYPERLIPIDEMIA 10/20/2007   Essential hypertension 05/30/2007   G E R D 05/30/2007   Other specified abnormal findings of blood chemistry 12/19/2006     REFERRING PROVIDER:  Cozette Delon Helling, PA-C     REFERRING DIAG:  M54.59 (ICD-10-CM) - Other low back pain  M54.16 (ICD-10-CM) - Radiculopathy, lumbar region  M48.062 (ICD-10-CM) - Spinal stenosis, lumbar region with neurogenic claudication  M47.816 (ICD-10-CM) - Spondylosis without myelopathy or radiculopathy, lumbar region  Rationale for Evaluation and Treatment: Rehabilitation  THERAPY DIAG:  Other low back pain  ONSET DATE: July 2024   SUBJECTIVE:                                                                                                                                                                                           SUBJECTIVE STATEMENT: Moved up to 45 min of pilates and did well with light springs feet in straps. Putting and chipping went well. Still have the spot on my back that hurts.  EVAL: July 2024 had pancreatitis and weakness in L2-3, 4-5. Went to [facility] where I have done shots in the past & suggested MRI. Referred to neurosurgeon, saw dr mavis. Referred to pain MD- 4 shots twice, first time worked for 2.5 hr and didn't work the second time. Did ablation but with no change. Provided with anti-inflammatories. Tried acupuncture, 4 treatments, started feeling some  relief after 3rd & 4th. Chipped 15-18 golf balls, tried full swings with little twinge, that evening was cooking for church and back started to hurt. Next morning I was in terrible pain, could hardly walk to the bathroom and back. Saw Dr Jama at [facility] who wanted an updated MRI- showed L2-3 deterioration moreso than 4-5. Went to [place], took prednisone which helped until the end of the Rx so started hydrocodone - seemed to do ok. Ran out, woke up Sunday AM and was in terrible pain. Moved to oxycodone , has been on it for 10 days, very little cut of pain. A week ago, tomorrow, [facility] denied sx bc of lack of 6 wk of preop PT.  Was playing golf, reformer pilates 3d/wk, 1/2 hr Trx with a trainer and 45 min on Fridays. Has stopped due to back pain.  H/o bladder CAx2. Currently taking 600 mg gabapentin TID and oxy 1 every 4 hr.  Get up bw 530-630, pain 8-8.5/10. Rt side pain wraps around lateral hip and up to iliac crest. Laying on either side hurts. Denies pain into legs. Lifting weight can create pain on lateral right hip to iliac crest.   PERTINENT HISTORY:  CKD, h/o bladder CA  PAIN:  Are you having pain? Yes: NPRS scale: mild right now Pain location: lower back to Rt lateral hip, proximal to Gr trochanter to iliac crest Pain description: severe  Aggravating factors: laying on either side, AM pain, lifting items, flexion to floor Relieving factors: pain meds a little  PRECAUTIONS:  None  RED FLAGS: None   WEIGHT BEARING RESTRICTIONS:  No  FALLS:  Has patient fallen in last 6 months? No  OCCUPATION:  retired  PLOF:  Independent  PATIENT GOALS:  Back to golf, travel, decrease pain   OBJECTIVE:  Note: Objective measures were completed at Evaluation unless otherwise noted.  DIAGNOSTIC FINDINGS:  CT Lumbar 05/30/23: FINDINGS: Segmentation: 5 lumbar type vertebrae.   Alignment: Normal.   Vertebrae: Inferior endplate Schmorl's nodes at L2 and L3 are unchanged from prior. New superior  endplate Schmorl's node at L3. No acute fracture. No suspicious bone lesion.   Paraspinal and other soft tissues: No paraspinal inflammatory changes.   Disc levels: Mild disc height loss at L2-3 and L3-4. Mild multilevel facet arthropathy.   IMPRESSION: 1. No acute fracture or traumatic malalignment of the lumbar spine. 2. Mild degenerative changes of the lumbar spine.    PATIENT SURVEYS:  Modified Oswestry 27/50= 54%  8/19 Mod oswestry 7/50 =14%  COGNITIVE STATUS: Per MD note 12/15/23: diagnosed with a mild cognitive disorder with memory loss    SENSATION: WFL  POSTURE:  rounded shoulders, decreased lumbar lordosis, and increased thoracic kyphosis, posterior pelvic tilt, lacking full knee extension bilat in standing <10 deg   Body Part #1 Lumbar  PALPATION: Eval: no recreation of concordant pain with central and lateral PA or SIJ PA mobs  Tightness in Rt lumbar paraspinals, QL and glut med   EVAL ROM: no distal symptoms with movement, some midline discomfort with extension, Full ROM available   Notable lack of hip extension in Rt hip during golf follow through- concordant pain in hip recreated  EVAL MMT- Rt hip flexion & abd 5/5                                                                                                                             TREATMENT DATE:   9/16 STM Lt QL, IASTM over incision site Trx progression: pull off, squat, hinge back, push ups, lat press Seated QL stretch Discussion of rotation progression in golf.   9/9 Reformer foot work [id number] Iso hamstring press down with add using ball Iso hamstring press into bar- hands in straps press down; also done with single leg TT Plank hands on bar- hip & shoulder dissociation  Tall kneel hands in straps  9/2 IASTM & STM lumbar paraspinals & QL on Lt Scar mobilization- IASTM, skin rolling Prone alt hip ext Prone alt UE ext Prone swimmer Cat/cow/child pose Shuttle supine 3*25 & sidelying  press 2*25 Green tband straight arm pull down- cues for core/glut activation   8/26 Seated anchored, row, horiz abd flexion green tband Seated GHJ ER, alternating punch green tband Primal push up- elbow plank on toes- on knees Tall kneel hip hinge+ glut set Prone alt LE ext, UE/LE opp ext  EVAL 05/08/24: Discussed return to pilates & things to avoid for now- provided with number to connect with instructors Discussed progressive return to golf Hip hinge with dowel for form   PATIENT EDUCATION:  Education details: Anatomy of condition, POC, HEP, exercise form/rationale Person educated: Patient Education method: Explanation, Demonstration, Tactile  cues, and Verbal cues Education comprehension: verbalized understanding, returned demonstration, verbal cues required, tactile cues required, and needs further education  HOME EXERCISE PROGRAM: Return to pilates reformer class, gentle AM stretches & glut set to stand out of bed   ASSESSMENT:  CLINICAL IMPRESSION: Has not seen MD for f/u at this time but continues to feel good. Notable improvement in scar mobility with concordant TTP in QL and decreased with manual therapy. Will f/u in a wekk to determine need for further PT and he will continue working with Health visitor.    EVAL: Patient is a 73 y.o. M who was seen today for physical therapy evaluation and treatment for chronic, worsening low back pain that wraps to Rt lateral hip. Pt does present with tightness in glutes and TFL. Was unable to recreate concordant pain with SLR test or joint mobilizations. Pt plans to return to neurosurgeon for surgery on lumbar spine at L2-3 but insurance is requiring PT. Pt will return to pilates reformer classes in a gentle manner as he has been doing it for a long time. Will also try gentle mobility exercises to reduce the severe AM pain.     OBJECTIVE IMPAIRMENTS: decreased activity tolerance, increased muscle spasms, impaired  flexibility, postural dysfunction, and pain.   ACTIVITY LIMITATIONS: lifting, bending, sitting, sleeping, and recreation/fitness  PARTICIPATION LIMITATIONS: meal prep, cleaning, laundry, and community activity  PERSONAL FACTORS: see PMH are also affecting patient's functional outcome.   REHAB POTENTIAL: Fair planning surgery  CLINICAL DECISION MAKING: Evolving/moderate complexity  EVALUATION COMPLEXITY: Moderate   GOALS: Goals reviewed with patient? Yes  SHORT TERM GOALS: Target date: 6/28  Verbalize compliance with AM stretches and mobility Baseline: discussed at eval Goal status: met    LONG TERM GOALS: Target date:  POC date  Performing pilates exercises without increased pain Baseline: just started back yesterday Goal status: ongoing 8/19  2.  Improve flexibility to allow full knee extension bilaterally in standing Baseline:  Goal status: Met 8/19  3.  Verbalize reduction in AM spasm Baseline: 95% improved Goal status: ongoing 8/19  4. Get back to playing golf  Baseline  Goal status: new     PLAN:  PT FREQUENCY: 1x/week  PT DURATION: 6 weeks  PLANNED INTERVENTIONS: [id number]- PT Re-evaluation, 97750- Physical Performance Testing, 97110-Therapeutic exercises, 97530- Therapeutic activity, 97112- Neuromuscular re-education, 97535- Self Care, [id number]- Manual therapy, [phone]- Gait training, [phone]- Aquatic Therapy, [phone] (1-2 muscles), [phone] (3+ muscles)- Dry Needling, Patient/Family education, Balance training, Stair training, Taping, Joint mobilization, Spinal mobilization, Cryotherapy, and Moist heat.  PLAN FOR NEXT SESSION: Manual PRN, core stability Harlene Cordon, PT, DPT 08/07/2024, 10:53 AM

## 2024-08-08 DIAGNOSIS — E291 Testicular hypofunction: Secondary | ICD-10-CM | POA: Diagnosis not present

## 2024-08-09 ENCOUNTER — Encounter (HOSPITAL_BASED_OUTPATIENT_CLINIC_OR_DEPARTMENT_OTHER): Admitting: Physical Therapy

## 2024-08-14 ENCOUNTER — Encounter (HOSPITAL_BASED_OUTPATIENT_CLINIC_OR_DEPARTMENT_OTHER): Admitting: Physical Therapy

## 2024-08-15 DIAGNOSIS — E291 Testicular hypofunction: Secondary | ICD-10-CM | POA: Diagnosis not present

## 2024-08-16 ENCOUNTER — Ambulatory Visit (HOSPITAL_BASED_OUTPATIENT_CLINIC_OR_DEPARTMENT_OTHER): Admitting: Physical Therapy

## 2024-08-16 ENCOUNTER — Encounter (HOSPITAL_BASED_OUTPATIENT_CLINIC_OR_DEPARTMENT_OTHER): Payer: Self-pay | Admitting: Physical Therapy

## 2024-08-16 DIAGNOSIS — M5459 Other low back pain: Secondary | ICD-10-CM

## 2024-08-16 NOTE — Therapy (Signed)
 OUTPATIENT PHYSICAL THERAPY Treatment   Patient Name: Brian Cortez MRN: [id number] DOB:06/25/1951, 73 y.o., male Today's Date: 08/16/2024  END OF SESSION:  PT End of Session - 08/16/24 1138     Visit Number 7    Number of Visits 7    Date for Recertification  08/25/24    Authorization Type [employer] MCR    Progress Note Due on Visit 10    PT Start Time 1138    PT Stop Time 1203    PT Time Calculation (min) 25 min    Activity Tolerance Patient tolerated treatment well    Behavior During Therapy WFL for tasks assessed/performed           Past Medical History:  Diagnosis Date   Acute pancreatitis    Anxiety    Bladder tumor    Carpal tunnel syndrome, left    intermittant   Chronic seasonal allergic rhinitis    Coronary artery disease    Coronary calcium  score is very high at 1582   DDD (degenerative disc disease), cervical    Diabetes mellitus type 2 in nonobese (HCC)    Dilated cardiomyopathy (HCC)    Diverticulosis of colon    Fecal incontinence    GERD (gastroesophageal reflux disease)    Grade II diastolic dysfunction 06/17/2017   per ECHO    Hemorrhoids    Hiatal hernia    History of adenomatous polyp of colon    2001;  2007;  2012   History of bladder cancer urologist-  dr chales   11-06-2013  s/p TURBT and BCG tx's   History of kidney stones    about 45 years ago   History of panic attacks    History of urethral stricture    Hyperlipidemia    Hypertension    Hypogonadism in male    LVH (left ventricular hypertrophy) 06/17/2017   Mild, noted on ECHO   Nonischemic cardiomyopathy [facility])    cardiologist-  dr rolan-- per last echo 01/ 2016 ef 50-55%  (up from 09/ 2014 ef 45-50%)   OSA on CPAP    since 2014   Pneumonia 11/2010   Left lower lobe   Pulmonary hypertension (HCC) 06/17/2017   Mild, per ECHO   Past Surgical History:  Procedure Laterality Date   CARDIOVASCULAR STRESS TEST  08-21-2013  dr rolan   normal nuclear study w/ no ischemia/   normal LV function and wall motion , ef 63%   CATARACT EXTRACTION W/ INTRAOCULAR LENS  IMPLANT, BILATERAL  2013   COLONOSCOPY  last one 03-03-2016   CYSTO/  DIRECT VISION INTERNAL URETEROTOMY  11/25/2006   LAPAROSCOPIC CHOLECYSTECTOMY  10/15/2006   MENISCUS REPAIR Right 12/2020   ORBITAL FRACTURE SURGERY  1986   left eye:  Jan 1986--- fixation of fractures, repair of optic nerve decompression , and complex closure   TONSILLECTOMY  child   TRANSTHORACIC ECHOCARDIOGRAM  12-16-2014   dr rolan   grade 1 diastolic dysfunction, ef 50-55% (improved from last study in 2014 , previous ef 45-50%) /  trivial MR and TR/  mild LAE/  inferior vena cava dilated, respirophasic diameter changes were blunted (<50%), consistant w/ elevated central venous pressure   TRANSURETHRAL RESECTION OF BLADDER TUMOR Bilateral 05/16/2018   Procedure: TRANSURETHRAL RESECTION OF BLADDER TUMOR (TURBT)/ RETROGRADE;  Surgeon: Nieves Cough, MD;  Location: [facility] ;  Service: Urology;  Laterality: Bilateral;   TRANSURETHRAL RESECTION OF BLADDER TUMOR WITH GYRUS (TURBT-GYRUS) N/A 11/06/2013   Procedure: TRANSURETHRAL RESECTION  OF BLADDER TUMOR WITH GYRUS (TURBT-GYRUS);  Surgeon: Arlena LILLETTE Gal, MD;  Location: [facility];  Service: Urology;  Laterality: N/A;   TRANSURETHRAL RESECTION OF BLADDER TUMOR WITH MITOMYCIN -C N/A 12/09/2016   Procedure: CYSTOSCOPY TRANSURETHRAL RESECTION OF BLADDER TUMOR WITH MITOMYCIN -C;  Surgeon: Arlena Gal, MD;  Location: [facility] [place];  Service: Urology;  Laterality: N/A;   VARICOSE VEIN SURGERY  2002   VASECTOMY     Patient Active Problem List   Diagnosis Date Noted   MCI (mild cognitive impairment) 05/16/2023   Primary open-angle glaucoma (POAG), moderate stage 05/13/2022   History of vitrectomy 04/08/2022   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   Chronic kidney disease, stage 3b (HCC) 05/15/2021   COVID-19 05/15/2021    Mild cognitive disorder 05/15/2021   Bilateral impacted cerumen 02/13/2021   Deviated septum 02/13/2021   Epistaxis 02/13/2021   Myopic macular degeneration 04/02/2020   Posterior vitreous detachment of right eye 04/02/2020   Early stage nonexudative age-related macular degeneration of both eyes 04/02/2020   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 04/02/2020   Low back pain 03/04/2020   Other specified local infections of the skin and subcutaneous tissue 08/20/2019   Secondary polycythemia 08/06/2019   Atherosclerosis of coronary artery 02/07/2019   Tinea cruris 10/16/2018   Neoplastic malignant related fatigue 09/20/2018   Convalescence after chemotherapy 09/20/2018   Anterior urethral stricture 08/04/2018   Bladder trabeculation 08/04/2018   Diverticula, bladder acquired 08/04/2018   Erectile dysfunction due to diseases classified elsewhere 08/04/2018   Low testosterone  in male 08/04/2018   Primary open angle glaucoma (POAG) of both eyes, moderate stage 02/10/2018   Obesity with body mass index greater than 30 09/14/2017   Incontinence of feces 04/22/2017   Pain in right knee 01/25/2017   Nasal congestion with rhinorrhea 09/08/2016   Chronic seasonal allergic rhinitis due to pollen 09/08/2016   Impaired fasting glucose 12/10/2015   Postherpetic neuralgia 11/12/2015   Herpes zoster without complication 11/04/2015   Acute recurrent ethmoidal sinusitis 09/09/2015   Bronchitis 09/09/2015   OSA on CPAP 09/09/2015   Rhinitis due to pollen 09/09/2015   Plantar fascial fibromatosis 11/25/2014   CAP (community acquired pneumonia) 10/15/2014   Allergic rhinitis 10/15/2014   Atelectasis 09/30/2014   Abnormal weight loss 09/06/2014   Cough 09/06/2014   CPAP rhinitis 08/27/2014   Anxiety disorder 03/15/2014   Colorectal polyps 03/15/2014   Glaucoma 03/15/2014   Screening for depression 03/15/2014   Male hypogonadism 03/15/2014   Other ill-defined heart diseases 03/15/2014    Bladder cancer (HCC) 11/06/2013   Sleep apnea with use of continuous positive airway pressure (CPAP) 08/23/2013   Coronary artery calcification seen on CAT scan 08/17/2013   Cardiomyopathy (HCC) 08/17/2013   Diastolic dysfunction 08/07/2013   Testosterone  deficiency 10/18/2012   CARPAL TUNNEL SYNDROME, LEFT 03/12/2010   Brachial neuritis or radiculitis 03/12/2010   NONSPECIFIC ABNORMAL ELECTROCARDIOGRAM 10/08/2009   ADENOMATOUS COLONIC POLYP 04/06/2008   Diverticulosis of colon 04/06/2008   Diaphragmatic hernia 12/18/2007   HYPERLIPIDEMIA 10/20/2007   Essential hypertension 05/30/2007   G E R D 05/30/2007   Other specified abnormal findings of blood chemistry 12/19/2006     REFERRING PROVIDER:  Cozette Delon Helling, PA-C     REFERRING DIAG:  M54.59 (ICD-10-CM) - Other low back pain  M54.16 (ICD-10-CM) - Radiculopathy, lumbar region  M48.062 (ICD-10-CM) - Spinal stenosis, lumbar region with neurogenic claudication  M47.816 (ICD-10-CM) - Spondylosis without myelopathy or radiculopathy, lumbar region  Rationale for Evaluation and Treatment: Rehabilitation  THERAPY DIAG:  Other low back pain  ONSET DATE: July 2024   SUBJECTIVE:                                                                                                                                                                                           SUBJECTIVE STATEMENT: It comes and goes. 65%+ okay.   EVAL: July 2024 had pancreatitis and weakness in L2-3, 4-5. Went to [employer] [facility] where I have done shots in the past & suggested MRI. Referred to neurosurgeon, saw dr mavis. Referred to pain MD- 4 shots twice, first time worked for 2.5 hr and didn't work the second time. Did ablation but with no change. Provided with anti-inflammatories. Tried acupuncture, 4 treatments, started feeling some relief after 3rd & 4th. Chipped 15-18 golf balls, tried full swings with little twinge, that evening was cooking for church  and back started to hurt. Next morning I was in terrible pain, could hardly walk to the bathroom and back. Saw Dr Jama at [facility] who wanted an updated MRI- showed L2-3 deterioration moreso than 4-5. Went to [place], took prednisone which helped until the end of the Rx so started hydrocodone - seemed to do ok. Ran out, woke up Sunday AM and was in terrible pain. Moved to oxycodone , has been on it for 10 days, very little cut of pain. A week ago, tomorrow, [facility] denied sx bc of lack of 6 wk of preop PT.  Was playing golf, reformer pilates 3d/wk, 1/2 hr Trx with a trainer and 45 min on Fridays. Has stopped due to back pain.  H/o bladder CAx2. Currently taking 600 mg gabapentin TID and oxy 1 every 4 hr.  Get up bw 530-630, pain 8-8.5/10. Rt side pain wraps around lateral hip and up to iliac crest. Laying on either side hurts. Denies pain into legs. Lifting weight can create pain on lateral right hip to iliac crest.   PERTINENT HISTORY:  CKD, h/o bladder CA  PAIN:  Are you having pain? Yes: NPRS scale: mild right now Pain location: lower back to Rt lateral hip, proximal to Gr trochanter to iliac crest Pain description: severe  Aggravating factors: laying on either side, AM pain, lifting items, flexion to floor Relieving factors: pain meds a little  PRECAUTIONS:  None  RED FLAGS: None   WEIGHT BEARING RESTRICTIONS:  No  FALLS:  Has patient fallen in last 6 months? No  OCCUPATION:  retired  PLOF:  Independent  PATIENT GOALS:  Back to golf, travel, decrease pain   OBJECTIVE:  Note: Objective measures were completed at Evaluation unless  otherwise noted.  DIAGNOSTIC FINDINGS:  CT Lumbar 05/30/23: FINDINGS: Segmentation: 5 lumbar type vertebrae.   Alignment: Normal.   Vertebrae: Inferior endplate Schmorl's nodes at L2 and L3 are unchanged from prior. New superior endplate Schmorl's node at L3. No acute fracture. No suspicious bone lesion.   Paraspinal and other soft tissues: No  paraspinal inflammatory changes.   Disc levels: Mild disc height loss at L2-3 and L3-4. Mild multilevel facet arthropathy.   IMPRESSION: 1. No acute fracture or traumatic malalignment of the lumbar spine. 2. Mild degenerative changes of the lumbar spine.    PATIENT SURVEYS:  Modified Oswestry 27/50= 54%  8/19 Mod oswestry 7/50 =14%  COGNITIVE STATUS: Per MD note 12/15/23: diagnosed with a mild cognitive disorder with memory loss    SENSATION: WFL  POSTURE:  rounded shoulders, decreased lumbar lordosis, and increased thoracic kyphosis, posterior pelvic tilt, lacking full knee extension bilat in standing <10 deg   Body Part #1 Lumbar  PALPATION: Eval: no recreation of concordant pain with central and lateral PA or SIJ PA mobs  Tightness in Rt lumbar paraspinals, QL and glut med   EVAL ROM: no distal symptoms with movement, some midline discomfort with extension, Full ROM available   Notable lack of hip extension in Rt hip during golf follow through- concordant pain in hip recreated  EVAL MMT- Rt hip flexion & abd 5/5                                                                                                                             TREATMENT DATE:   9/25 Edu on use of theracane for periscapular knots STM Lt lumbar paraspinals, QL, Lt lower rib mobilization Discussed return to golf and progressive swings/controlling momentum   9/16 STM Lt QL, IASTM over incision site Trx progression: pull off, squat, hinge back, push ups, lat press Seated QL stretch Discussion of rotation progression in golf.   9/9 Reformer foot work [id number] Iso hamstring press down with add using ball Iso hamstring press into bar- hands in straps press down; also done with single leg TT Plank hands on bar- hip & shoulder dissociation  Tall kneel hands in straps  9/2 IASTM & STM lumbar paraspinals & QL on Lt Scar mobilization- IASTM, skin rolling Prone alt hip ext Prone alt UE  ext Prone swimmer Cat/cow/child pose Shuttle supine 3*25 & sidelying press 2*25 Green tband straight arm pull down- cues for core/glut activation   8/26 Seated anchored, row, horiz abd flexion green tband Seated GHJ ER, alternating punch green tband Primal push up- elbow plank on toes- on knees Tall kneel hip hinge+ glut set Prone alt LE ext, UE/LE opp ext  EVAL 05/08/24: Discussed return to pilates & things to avoid for now- provided with number to connect with instructors Discussed progressive return to golf Hip hinge with dowel for form   PATIENT EDUCATION:  Education details: Anatomy of condition, POC, HEP, exercise form/rationale Person educated: Patient  Education method: Explanation, Demonstration, Tactile cues, and Verbal cues Education comprehension: verbalized understanding, returned demonstration, verbal cues required, tactile cues required, and needs further education  HOME EXERCISE PROGRAM: Return to pilates reformer class, gentle AM stretches & glut set to stand out of bed   ASSESSMENT:  CLINICAL IMPRESSION: Discomfort in Lt, lateral lumbar region has decreased to intermittent pain rather than constant. He has returned to pilates workouts 3x/week and is also doing exercises with TrX straps. Periscapular tightness consistent with return to challenging strength and posture- pt educated on use of theracane as option for trigger point relief. Pt will return to surgeon on 10/22 and I asked that he avoid golf swings with momentum until cleared but ok to work on slow motion ranges with awareness of posture and core engagement. Will follow up after 1 month and visit with surgeon so he is able to continue progressing his golf swing, pilates intensity and TrX workouts. He is working with trainers that are watching form so I do not feel it is necessary to f/u regularly until that time. Encouraged pt to reach out to me with any further questions.    EVAL: Patient is a 73 y.o. M  who was seen today for physical therapy evaluation and treatment for chronic, worsening low back pain that wraps to Rt lateral hip. Pt does present with tightness in glutes and TFL. Was unable to recreate concordant pain with SLR test or joint mobilizations. Pt plans to return to neurosurgeon for surgery on lumbar spine at L2-3 but insurance is requiring PT. Pt will return to pilates reformer classes in a gentle manner as he has been doing it for a long time. Will also try gentle mobility exercises to reduce the severe AM pain.     OBJECTIVE IMPAIRMENTS: decreased activity tolerance, increased muscle spasms, impaired flexibility, postural dysfunction, and pain.   ACTIVITY LIMITATIONS: lifting, bending, sitting, sleeping, and recreation/fitness  PARTICIPATION LIMITATIONS: meal prep, cleaning, laundry, and community activity  PERSONAL FACTORS: see PMH are also affecting patient's functional outcome.   REHAB POTENTIAL: Fair planning surgery  CLINICAL DECISION MAKING: Evolving/moderate complexity  EVALUATION COMPLEXITY: Moderate   GOALS: Goals reviewed with patient? Yes  SHORT TERM GOALS: Target date: 6/28  Verbalize compliance with AM stretches and mobility Baseline: discussed at eval Goal status: met    LONG TERM GOALS: Target date:  POC date  Performing pilates exercises without increased pain Baseline: back to 3 days/week Goal status: Met  2.  Improve flexibility to allow full knee extension bilaterally in standing Baseline:  Goal status: Met 8/19  3.  Verbalize reduction in AM spasm Baseline:  Goal status: MET  4. Get back to playing golf  Baseline  Goal status: new     PLAN:  PT FREQUENCY: 1x/week  PT DURATION: 6 weeks  PLANNED INTERVENTIONS: [id number]- PT Re-evaluation, 97750- Physical Performance Testing, 97110-Therapeutic exercises, 97530- Therapeutic activity, 97112- Neuromuscular re-education, 97535- Self Care, [id number]- Manual therapy, [id number]- Gait training,  [phone]- Aquatic Therapy, [phone] (1-2 muscles), [phone] (3+ muscles)- Dry Needling, Patient/Family education, Balance training, Stair training, Taping, Joint mobilization, Spinal mobilization, Cryotherapy, and Moist heat.  PLAN FOR NEXT SESSION: Manual PRN, core stability Harlene Cordon, PT, DPT 08/16/2024, 12:23 PM

## 2024-08-22 DIAGNOSIS — E291 Testicular hypofunction: Secondary | ICD-10-CM | POA: Diagnosis not present

## 2024-08-25 DIAGNOSIS — Z23 Encounter for immunization: Secondary | ICD-10-CM | POA: Diagnosis not present

## 2024-08-29 DIAGNOSIS — E291 Testicular hypofunction: Secondary | ICD-10-CM | POA: Diagnosis not present

## 2024-08-30 ENCOUNTER — Other Ambulatory Visit (HOSPITAL_BASED_OUTPATIENT_CLINIC_OR_DEPARTMENT_OTHER): Payer: Self-pay

## 2024-08-30 MED ORDER — COMIRNATY 30 MCG/0.3ML IM SUSY
0.3000 mL | PREFILLED_SYRINGE | Freq: Once | INTRAMUSCULAR | 0 refills | Status: AC
  Filled 2024-08-30: qty 0.3, 1d supply, fill #0

## 2024-09-05 DIAGNOSIS — E291 Testicular hypofunction: Secondary | ICD-10-CM | POA: Diagnosis not present

## 2024-09-12 DIAGNOSIS — M25562 Pain in left knee: Secondary | ICD-10-CM | POA: Diagnosis not present

## 2024-09-12 DIAGNOSIS — G8929 Other chronic pain: Secondary | ICD-10-CM | POA: Diagnosis not present

## 2024-09-12 DIAGNOSIS — M25561 Pain in right knee: Secondary | ICD-10-CM | POA: Diagnosis not present

## 2024-09-12 DIAGNOSIS — M4326 Fusion of spine, lumbar region: Secondary | ICD-10-CM | POA: Diagnosis not present

## 2024-09-12 DIAGNOSIS — E291 Testicular hypofunction: Secondary | ICD-10-CM | POA: Diagnosis not present

## 2024-09-19 DIAGNOSIS — E291 Testicular hypofunction: Secondary | ICD-10-CM | POA: Diagnosis not present

## 2024-09-20 ENCOUNTER — Encounter (HOSPITAL_BASED_OUTPATIENT_CLINIC_OR_DEPARTMENT_OTHER): Admitting: Physical Therapy

## 2024-09-24 ENCOUNTER — Encounter: Payer: Self-pay | Admitting: Internal Medicine

## 2024-09-26 DIAGNOSIS — E291 Testicular hypofunction: Secondary | ICD-10-CM | POA: Diagnosis not present

## 2024-10-01 ENCOUNTER — Other Ambulatory Visit: Payer: Self-pay | Admitting: Internal Medicine

## 2024-10-03 DIAGNOSIS — E291 Testicular hypofunction: Secondary | ICD-10-CM | POA: Diagnosis not present

## 2024-10-10 DIAGNOSIS — E291 Testicular hypofunction: Secondary | ICD-10-CM | POA: Diagnosis not present

## 2024-10-15 DIAGNOSIS — G8929 Other chronic pain: Secondary | ICD-10-CM | POA: Diagnosis not present

## 2024-10-15 DIAGNOSIS — M25561 Pain in right knee: Secondary | ICD-10-CM | POA: Diagnosis not present

## 2024-10-15 DIAGNOSIS — M25562 Pain in left knee: Secondary | ICD-10-CM | POA: Diagnosis not present

## 2024-10-15 DIAGNOSIS — Z96651 Presence of right artificial knee joint: Secondary | ICD-10-CM | POA: Diagnosis not present

## 2024-10-17 DIAGNOSIS — E291 Testicular hypofunction: Secondary | ICD-10-CM | POA: Diagnosis not present

## 2024-10-24 DIAGNOSIS — E291 Testicular hypofunction: Secondary | ICD-10-CM | POA: Diagnosis not present

## 2024-10-26 DIAGNOSIS — E785 Hyperlipidemia, unspecified: Secondary | ICD-10-CM | POA: Diagnosis not present

## 2024-10-26 DIAGNOSIS — E669 Obesity, unspecified: Secondary | ICD-10-CM | POA: Diagnosis not present

## 2024-10-26 DIAGNOSIS — M4800 Spinal stenosis, site unspecified: Secondary | ICD-10-CM | POA: Diagnosis not present

## 2024-10-26 DIAGNOSIS — J069 Acute upper respiratory infection, unspecified: Secondary | ICD-10-CM | POA: Diagnosis not present

## 2024-10-26 DIAGNOSIS — N1832 Chronic kidney disease, stage 3b: Secondary | ICD-10-CM | POA: Diagnosis not present

## 2024-10-26 DIAGNOSIS — I129 Hypertensive chronic kidney disease with stage 1 through stage 4 chronic kidney disease, or unspecified chronic kidney disease: Secondary | ICD-10-CM | POA: Diagnosis not present

## 2024-10-26 DIAGNOSIS — K852 Alcohol induced acute pancreatitis without necrosis or infection: Secondary | ICD-10-CM | POA: Diagnosis not present

## 2024-10-26 DIAGNOSIS — F1010 Alcohol abuse, uncomplicated: Secondary | ICD-10-CM | POA: Diagnosis not present

## 2024-10-26 DIAGNOSIS — I2584 Coronary atherosclerosis due to calcified coronary lesion: Secondary | ICD-10-CM | POA: Diagnosis not present

## 2024-10-26 DIAGNOSIS — E118 Type 2 diabetes mellitus with unspecified complications: Secondary | ICD-10-CM | POA: Diagnosis not present

## 2024-10-26 DIAGNOSIS — C679 Malignant neoplasm of bladder, unspecified: Secondary | ICD-10-CM | POA: Diagnosis not present

## 2024-10-26 DIAGNOSIS — G3184 Mild cognitive impairment, so stated: Secondary | ICD-10-CM | POA: Diagnosis not present

## 2024-10-31 DIAGNOSIS — N521 Erectile dysfunction due to diseases classified elsewhere: Secondary | ICD-10-CM | POA: Diagnosis not present

## 2024-10-31 DIAGNOSIS — E291 Testicular hypofunction: Secondary | ICD-10-CM | POA: Diagnosis not present

## 2024-11-02 ENCOUNTER — Encounter: Payer: Self-pay | Admitting: Cardiology

## 2024-11-03 DIAGNOSIS — R300 Dysuria: Secondary | ICD-10-CM | POA: Diagnosis not present

## 2024-11-03 DIAGNOSIS — N3001 Acute cystitis with hematuria: Secondary | ICD-10-CM | POA: Diagnosis not present

## 2024-11-05 DIAGNOSIS — E291 Testicular hypofunction: Secondary | ICD-10-CM | POA: Diagnosis not present

## 2024-11-05 DIAGNOSIS — H401132 Primary open-angle glaucoma, bilateral, moderate stage: Secondary | ICD-10-CM | POA: Diagnosis not present

## 2024-11-09 ENCOUNTER — Telehealth: Payer: Self-pay

## 2024-11-09 NOTE — Telephone Encounter (Signed)
 PA request received for Terbinafine  HCl 250mg  tablet. PA submitted and waiting on response.  LAMAR KIL  (Key: [id number]) PA Case ID #: [id number] Rx #: [id number]

## 2024-11-12 DIAGNOSIS — E291 Testicular hypofunction: Secondary | ICD-10-CM | POA: Diagnosis not present

## 2024-12-04 ENCOUNTER — Encounter (HOSPITAL_BASED_OUTPATIENT_CLINIC_OR_DEPARTMENT_OTHER): Payer: Self-pay | Admitting: Physical Therapy

## 2024-12-04 NOTE — Telephone Encounter (Signed)
 PA was denied

## 2024-12-10 ENCOUNTER — Other Ambulatory Visit: Payer: Self-pay | Admitting: Cardiology

## 2024-12-11 ENCOUNTER — Ambulatory Visit (HOSPITAL_BASED_OUTPATIENT_CLINIC_OR_DEPARTMENT_OTHER): Attending: Physician Assistant | Admitting: Physical Therapy

## 2024-12-11 DIAGNOSIS — Z96651 Presence of right artificial knee joint: Secondary | ICD-10-CM | POA: Diagnosis not present

## 2024-12-11 DIAGNOSIS — M25561 Pain in right knee: Secondary | ICD-10-CM | POA: Insufficient documentation

## 2024-12-11 DIAGNOSIS — G8929 Other chronic pain: Secondary | ICD-10-CM | POA: Diagnosis present

## 2024-12-11 NOTE — Therapy (Signed)
 " OUTPATIENT PHYSICAL THERAPY LOWER EXTREMITY EVALUATION   Patient Name: Brian Cortez MRN: [id number] DOB:[birth year], 74 y.o., male Today's Date: 12/12/2024  END OF SESSION:  PT End of Session - 12/11/24 1201     Visit Number 1    Number of Visits 6    Date for Recertification  01/22/25    Authorization Type [employer] MCR    PT Start Time 1159    PT Stop Time 1249    PT Time Calculation (min) 50 min    Activity Tolerance Patient tolerated treatment well    Behavior During Therapy WFL for tasks assessed/performed          Past Medical History:  Diagnosis Date   Acute pancreatitis    Anxiety    Bladder tumor    Carpal tunnel syndrome, left    intermittant   Chronic seasonal allergic rhinitis    Coronary artery disease    Coronary calcium  score is very high at 1582   DDD (degenerative disc disease), cervical    Diabetes mellitus type 2 in nonobese (HCC)    Dilated cardiomyopathy (HCC)    Diverticulosis of colon    Fecal incontinence    GERD (gastroesophageal reflux disease)    Grade II diastolic dysfunction 06/17/2017   per ECHO    Hemorrhoids    Hiatal hernia    History of adenomatous polyp of colon    2001;  2007;  2012   History of bladder cancer urologist-  dr chales   11-06-2013  s/p TURBT and BCG tx's   History of kidney stones    about 45 years ago   History of panic attacks    History of urethral stricture    Hyperlipidemia    Hypertension    Hypogonadism in male    LVH (left ventricular hypertrophy) 06/17/2017   Mild, noted on ECHO   Nonischemic cardiomyopathy [facility])    cardiologist-  dr rolan-- per last echo 01/ 2016 ef 50-55%  (up from 09/ 2014 ef 45-50%)   OSA on CPAP    since 2014   Pneumonia 11/2010   Left lower lobe   Pulmonary hypertension (HCC) 06/17/2017   Mild, per ECHO   Past Surgical History:  Procedure Laterality Date   CARDIOVASCULAR STRESS TEST  08-21-2013  dr rolan   normal nuclear study w/ no ischemia/  normal LV  function and wall motion , ef 63%   CATARACT EXTRACTION W/ INTRAOCULAR LENS  IMPLANT, BILATERAL  2013   COLONOSCOPY  last one 03-03-2016   CYSTO/  DIRECT VISION INTERNAL URETEROTOMY  11/25/2006   LAPAROSCOPIC CHOLECYSTECTOMY  10/15/2006   MENISCUS REPAIR Right 12/2020   ORBITAL FRACTURE SURGERY  1986   left eye:  Jan 1986--- fixation of fractures, repair of optic nerve decompression , and complex closure   TONSILLECTOMY  child   TRANSTHORACIC ECHOCARDIOGRAM  12-16-2014   dr rolan   grade 1 diastolic dysfunction, ef 50-55% (improved from last study in 2014 , previous ef 45-50%) /  trivial MR and TR/  mild LAE/  inferior vena cava dilated, respirophasic diameter changes were blunted (<50%), consistant w/ elevated central venous pressure   TRANSURETHRAL RESECTION OF BLADDER TUMOR Bilateral 05/16/2018   Procedure: TRANSURETHRAL RESECTION OF BLADDER TUMOR (TURBT)/ RETROGRADE;  Surgeon: Nieves Cough, MD;  Location: [facility] [place];  Service: Urology;  Laterality: Bilateral;   TRANSURETHRAL RESECTION OF BLADDER TUMOR WITH GYRUS (TURBT-GYRUS) N/A 11/06/2013   Procedure: TRANSURETHRAL RESECTION OF BLADDER TUMOR WITH GYRUS (TURBT-GYRUS);  Surgeon: Arlena LILLETTE Gal, MD;  Location: [facility];  Service: Urology;  Laterality: N/A;   TRANSURETHRAL RESECTION OF BLADDER TUMOR WITH MITOMYCIN -C N/A 12/09/2016   Procedure: CYSTOSCOPY TRANSURETHRAL RESECTION OF BLADDER TUMOR WITH MITOMYCIN -C;  Surgeon: Arlena Gal, MD;  Location: [facility] [place];  Service: Urology;  Laterality: N/A;   VARICOSE VEIN SURGERY  2002   VASECTOMY     Patient Active Problem List   Diagnosis Date Noted   MCI (mild cognitive impairment) 05/16/2023   Primary open-angle glaucoma (POAG), moderate stage 05/13/2022   History of vitrectomy 04/08/2022   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   Chronic kidney disease, stage 3b (HCC) 05/15/2021   COVID-19 05/15/2021   Mild  cognitive disorder 05/15/2021   Bilateral impacted cerumen 02/13/2021   Deviated septum 02/13/2021   Epistaxis 02/13/2021   Myopic macular degeneration 04/02/2020   Posterior vitreous detachment of right eye 04/02/2020   Early stage nonexudative age-related macular degeneration of both eyes 04/02/2020   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 04/02/2020   Low back pain 03/04/2020   Other specified local infections of the skin and subcutaneous tissue 08/20/2019   Secondary polycythemia 08/06/2019   Atherosclerosis of coronary artery 02/07/2019   Tinea cruris 10/16/2018   Neoplastic malignant related fatigue 09/20/2018   Convalescence after chemotherapy 09/20/2018   Anterior urethral stricture 08/04/2018   Bladder trabeculation 08/04/2018   Diverticula, bladder acquired 08/04/2018   Erectile dysfunction due to diseases classified elsewhere 08/04/2018   Low testosterone  in male 08/04/2018   Primary open angle glaucoma (POAG) of both eyes, moderate stage 02/10/2018   Obesity with body mass index greater than 30 09/14/2017   Incontinence of feces 04/22/2017   Pain in right knee 01/25/2017   Nasal congestion with rhinorrhea 09/08/2016   Chronic seasonal allergic rhinitis due to pollen 09/08/2016   Impaired fasting glucose 12/10/2015   Postherpetic neuralgia 11/12/2015   Herpes zoster without complication 11/04/2015   Acute recurrent ethmoidal sinusitis 09/09/2015   Bronchitis 09/09/2015   OSA on CPAP 09/09/2015   Rhinitis due to pollen 09/09/2015   Plantar fascial fibromatosis 11/25/2014   CAP (community acquired pneumonia) 10/15/2014   Allergic rhinitis 10/15/2014   Atelectasis 09/30/2014   Abnormal weight loss 09/06/2014   Cough 09/06/2014   CPAP rhinitis 08/27/2014   Anxiety disorder 03/15/2014   Colorectal polyps 03/15/2014   Glaucoma 03/15/2014   Screening for depression 03/15/2014   Male hypogonadism 03/15/2014   Other ill-defined heart diseases 03/15/2014    Bladder cancer (HCC) 11/06/2013   Sleep apnea with use of continuous positive airway pressure (CPAP) 08/23/2013   Coronary artery calcification seen on CAT scan 08/17/2013   Cardiomyopathy (HCC) 08/17/2013   Diastolic dysfunction 08/07/2013   Testosterone  deficiency 10/18/2012   CARPAL TUNNEL SYNDROME, LEFT 03/12/2010   Brachial neuritis or radiculitis 03/12/2010   NONSPECIFIC ABNORMAL ELECTROCARDIOGRAM 10/08/2009   ADENOMATOUS COLONIC POLYP 04/06/2008   Diverticulosis of colon 04/06/2008   Diaphragmatic hernia 12/18/2007   HYPERLIPIDEMIA 10/20/2007   Essential hypertension 05/30/2007   G E R D 05/30/2007   Other specified abnormal findings of blood chemistry 12/19/2006    PCP: ***  REFERRING PROVIDER: Fairy Lamar Lee  PA-C  REFERRING DIAG: [phone]  Presence of R artificial knee joint    M25.561, G89.29  Chronic pain of R knee  THERAPY DIAG:  Chronic pain of right knee  Rationale for Evaluation and Treatment: Rehabilitation  ONSET DATE: ***  SUBJECTIVE:   SUBJECTIVE STATEMENT: Pt  underwent R TKA in April 2023.  His knee was doing well until he noticed a metallic sound in his knee when performing stairs on vacation.  Several months later, he was descending stairs at his home and had a misstep which caused him to jam his knee on the concrete.    Pt saw PA on 11/24 and had x rays.  Pt states he had a slight separation, hairline space from the bone and hardware.    Pt uses the golf cart when playing golf and occasionally has R knee pain.  Pt states it depends on the terrain and distance.  His pain is inconsistent.  Pt has to careful with squats and is limited can have increased pain with squats.   PERTINENT HISTORY: Type II DM TLIF L2-3 06/07/24 Chronic kidney disease  Gout TKR 02/2022  Pt has bladder CA Cardiomyopathy/ CAD--Pt denies Arthritis    PAIN:  NPRS:  0/10 current, 6/10 worst, Average daily pain 3.5-4/10 Location:  R knee  PRECAUTIONS: {Therapy  precautions:24002}  RED FLAGS: {PT Red Flags:29287}   WEIGHT BEARING RESTRICTIONS: No  FALLS:  Has patient fallen in last 6 months? No  LIVING ENVIRONMENT: Lives with: lives with their spouse Lives in: 1 story home Stairs: has stairs to go into a bonus room.  Has 2 steps and 5 steps depending on the entrance.    OCCUPATION: retired  PLOF: Independent  PATIENT GOALS: reduce pain with activity   OBJECTIVE:  Note: Objective measures were completed at Evaluation unless otherwise noted.  DIAGNOSTIC FINDINGS:  X ray: L knee (Epic) IMPRESSION: 1. No acute fracture. 2. No malalignment. 3. Small joint effusion. 4. Mild tricompartmental degenerative changes. 5. Vascular calcifications.   PATIENT SURVEYS:  LEFS:  53/80  COGNITION: Overall cognitive status: Within functional limits for tasks assessed     SENSATION: {sensation:27233}  EDEMA:  {edema:24020}  MUSCLE LENGTH: Hamstrings: Right *** deg; Left *** deg Debby test: Right *** deg; Left *** deg  POSTURE: {posture:25561}  PALPATION: ***  LOWER EXTREMITY STRENGTH:  MMT Right eval Left eval  Hip flexion 5/5 5/5  Hip extension    Hip abduction 5/5 5/5  Hip adduction    Hip internal rotation    Hip external rotation    Knee flexion 5/5 seated 5/5 seated  Knee extension 5/5 with .5-1/10 5/5  Ankle dorsiflexion    Ankle plantarflexion    Ankle inversion    Ankle eversion     (Blank rows = not tested)  LOWER EXTREMITY ROM:  ROM Right eval Left eval  Hip flexion    Hip extension    Hip abduction    Hip adduction    Hip internal rotation    Hip external rotation    Knee flexion 120 128  Knee extension 0 0  Ankle dorsiflexion    Ankle plantarflexion    Ankle inversion    Ankle eversion     (Blank rows = not tested)  LOWER EXTREMITY SPECIAL TESTS:  {LEspecialtests:26242}  FUNCTIONAL TESTS:  No pain on 4 inch step, tinge of pain on 6 inch step, 2-2.5/10 on 8 inch step without UE  support  GAIT: Assistive device utilized: None Level of assistance: Complete Independence Comments: Heel to toe gait.  No limping.  limited lumbar rotation.  TREATMENT DATE: ***  Pt performs pilates 2x/wk (including the land) and does TrX straps 1x/wk.  PATIENT EDUCATION:  Education details: *** Person educated: {Person educated:25204} Education method: {Education Method:25205} Education comprehension: {Education Comprehension:25206}  HOME EXERCISE PROGRAM: ***  ASSESSMENT:  CLINICAL IMPRESSION: Patient is a 74 y.o. male who was seen today for physical therapy evaluation and treatment for ***.  Pt had a fusion and his back still bothers him.   OBJECTIVE IMPAIRMENTS: {opptimpairments:25111}.   ACTIVITY LIMITATIONS: {activitylimitations:27494}  PARTICIPATION LIMITATIONS: {participationrestrictions:25113}  PERSONAL FACTORS: {Personal factors:25162} are also affecting patient's functional outcome.   REHAB POTENTIAL: Good  CLINICAL DECISION MAKING: Stable/uncomplicated  EVALUATION COMPLEXITY: Low   GOALS:   SHORT TERM GOALS: Target date: *** *** Baseline: Goal status: INITIAL  2.  *** Baseline:  Goal status: INITIAL  3.  *** Baseline:  Goal status: INITIAL  4.  *** Baseline:  Goal status: INITIAL  5.  *** Baseline:  Goal status: INITIAL  6.  *** Baseline:  Goal status: INITIAL  LONG TERM GOALS: Target date: ***  *** Baseline:  Goal status: INITIAL  2.  *** Baseline:  Goal status: INITIAL  3.  *** Baseline:  Goal status: INITIAL  4.  *** Baseline:  Goal status: INITIAL  5.  *** Baseline:  Goal status: INITIAL  6.  *** Baseline:  Goal status: INITIAL   PLAN:  PT FREQUENCY: 1x/week  PT DURATION: 6 weeks  PLANNED INTERVENTIONS: {rehab planned interventions:25118::97110-Therapeutic  exercises,97530- Therapeutic [phone]- Neuromuscular re-education,97535- Self Rjmz,02859- Manual therapy,Patient/Family education}  PLAN FOR NEXT SESSION: ***  Referring diagnosis? *** Treatment diagnosis? (if different than referring diagnosis) *** What was this (referring dx) caused by? []  Surgery []  Fall []  Ongoing issue []  Arthritis []  Other: ____________  Laterality: []  Rt []  Lt []  Both  Check all possible CPT codes:  *CHOOSE 10 OR LESS*    See Planned Interventions listed in the Plan section of the Evaluation.     Mose Minerva, PT 12/12/2024, 5:49 PM  "

## 2024-12-12 ENCOUNTER — Encounter: Payer: Self-pay | Admitting: Cardiology

## 2024-12-12 ENCOUNTER — Encounter (HOSPITAL_BASED_OUTPATIENT_CLINIC_OR_DEPARTMENT_OTHER): Payer: Self-pay | Admitting: Physical Therapy

## 2024-12-14 ENCOUNTER — Telehealth: Payer: Self-pay | Admitting: Pharmacy Technician

## 2024-12-14 ENCOUNTER — Other Ambulatory Visit (HOSPITAL_COMMUNITY): Payer: Self-pay

## 2024-12-14 MED ORDER — VASCEPA 1 G PO CAPS: 2.0000 g | ORAL_CAPSULE | Freq: Two times a day (BID) | ORAL | 3 refills | Status: AC

## 2024-12-14 NOTE — Telephone Encounter (Signed)
 Reject for generic Claim for brand  Insurance will pay for brand

## 2024-12-17 NOTE — Telephone Encounter (Signed)
 Labs outside of Normal  In accordance with refill protocols, please review and address the following requirements before this medication refill can be authorized:  Labs

## 2024-12-20 ENCOUNTER — Ambulatory Visit (HOSPITAL_BASED_OUTPATIENT_CLINIC_OR_DEPARTMENT_OTHER): Payer: Self-pay | Admitting: Physical Therapy

## 2024-12-20 ENCOUNTER — Encounter (HOSPITAL_BASED_OUTPATIENT_CLINIC_OR_DEPARTMENT_OTHER): Payer: Self-pay | Admitting: Physical Therapy

## 2024-12-20 DIAGNOSIS — G8929 Other chronic pain: Secondary | ICD-10-CM | POA: Diagnosis not present

## 2024-12-20 NOTE — Therapy (Signed)
 " OUTPATIENT PHYSICAL THERAPY LOWER EXTREMITY TREATMENT   Patient Name: Brian Cortez MRN: [id number] DOB:06/09/1951, 74 y.o., male Today's Date: 12/21/2024  END OF SESSION:  PT End of Session - 12/20/24 0936     Visit Number 2    Number of Visits 6    Date for Recertification  01/22/25    Authorization Type [employer] MCR    Authorization Time Period 12/11/24 - 02/02/25    Authorization - Number of Visits 6    PT Start Time 0934    PT Stop Time 1015    PT Time Calculation (min) 41 min    Activity Tolerance Patient tolerated treatment well    Behavior During Therapy WFL for tasks assessed/performed          Past Medical History:  Diagnosis Date   Acute pancreatitis    Anxiety    Bladder tumor    Carpal tunnel syndrome, left    intermittant   Chronic seasonal allergic rhinitis    Coronary artery disease    Coronary calcium  score is very high at 1582   DDD (degenerative disc disease), cervical    Diabetes mellitus type 2 in nonobese (HCC)    Dilated cardiomyopathy (HCC)    Diverticulosis of colon    Fecal incontinence    GERD (gastroesophageal reflux disease)    Grade II diastolic dysfunction 06/17/2017   per ECHO    Hemorrhoids    Hiatal hernia    History of adenomatous polyp of colon    2001;  2007;  2012   History of bladder cancer urologist-  dr chales   11-06-2013  s/p TURBT and BCG tx's   History of kidney stones    about 45 years ago   History of panic attacks    History of urethral stricture    Hyperlipidemia    Hypertension    Hypogonadism in male    LVH (left ventricular hypertrophy) 06/17/2017   Mild, noted on ECHO   Nonischemic cardiomyopathy [facility])    cardiologist-  dr rolan-- per last echo 01/ 2016 ef 50-55%  (up from 09/ 2014 ef 45-50%)   OSA on CPAP    since 2014   Pneumonia 11/2010   Left lower lobe   Pulmonary hypertension (HCC) 06/17/2017   Mild, per ECHO   Past Surgical History:  Procedure Laterality Date   CARDIOVASCULAR STRESS  TEST  08-21-2013  dr rolan   normal nuclear study w/ no ischemia/  normal LV function and wall motion , ef 63%   CATARACT EXTRACTION W/ INTRAOCULAR LENS  IMPLANT, BILATERAL  2013   COLONOSCOPY  last one 03-03-2016   CYSTO/  DIRECT VISION INTERNAL URETEROTOMY  11/25/2006   LAPAROSCOPIC CHOLECYSTECTOMY  10/15/2006   MENISCUS REPAIR Right 12/2020   ORBITAL FRACTURE SURGERY  1986   left eye:  Jan 1986--- fixation of fractures, repair of optic nerve decompression , and complex closure   TONSILLECTOMY  child   TRANSTHORACIC ECHOCARDIOGRAM  12-16-2014   dr rolan   grade 1 diastolic dysfunction, ef 50-55% (improved from last study in 2014 , previous ef 45-50%) /  trivial MR and TR/  mild LAE/  inferior vena cava dilated, respirophasic diameter changes were blunted (<50%), consistant w/ elevated central venous pressure   TRANSURETHRAL RESECTION OF BLADDER TUMOR Bilateral 05/16/2018   Procedure: TRANSURETHRAL RESECTION OF BLADDER TUMOR (TURBT)/ RETROGRADE;  Surgeon: Nieves Cough, MD;  Location: [facility];  Service: Urology;  Laterality: Bilateral;   TRANSURETHRAL RESECTION OF BLADDER  TUMOR WITH GYRUS (TURBT-GYRUS) N/A 11/06/2013   Procedure: TRANSURETHRAL RESECTION OF BLADDER TUMOR WITH GYRUS (TURBT-GYRUS);  Surgeon: Arlena LILLETTE Gal, MD;  Location: [facility];  Service: Urology;  Laterality: N/A;   TRANSURETHRAL RESECTION OF BLADDER TUMOR WITH MITOMYCIN -C N/A 12/09/2016   Procedure: CYSTOSCOPY TRANSURETHRAL RESECTION OF BLADDER TUMOR WITH MITOMYCIN -C;  Surgeon: Arlena Gal, MD;  Location: [facility];  Service: Urology;  Laterality: N/A;   VARICOSE VEIN SURGERY  2002   VASECTOMY     Patient Active Problem List   Diagnosis Date Noted   MCI (mild cognitive impairment) 05/16/2023   Primary open-angle glaucoma (POAG), moderate stage 05/13/2022   History of vitrectomy 04/08/2022   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   Chronic  kidney disease, stage 3b (HCC) 05/15/2021   COVID-19 05/15/2021   Mild cognitive disorder 05/15/2021   Bilateral impacted cerumen 02/13/2021   Deviated septum 02/13/2021   Epistaxis 02/13/2021   Myopic macular degeneration 04/02/2020   Posterior vitreous detachment of right eye 04/02/2020   Early stage nonexudative age-related macular degeneration of both eyes 04/02/2020   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 04/02/2020   Low back pain 03/04/2020   Other specified local infections of the skin and subcutaneous tissue 08/20/2019   Secondary polycythemia 08/06/2019   Atherosclerosis of coronary artery 02/07/2019   Tinea cruris 10/16/2018   Neoplastic malignant related fatigue 09/20/2018   Convalescence after chemotherapy 09/20/2018   Anterior urethral stricture 08/04/2018   Bladder trabeculation 08/04/2018   Diverticula, bladder acquired 08/04/2018   Erectile dysfunction due to diseases classified elsewhere 08/04/2018   Low testosterone  in male 08/04/2018   Primary open angle glaucoma (POAG) of both eyes, moderate stage 02/10/2018   Obesity with body mass index greater than 30 09/14/2017   Incontinence of feces 04/22/2017   Pain in right knee 01/25/2017   Nasal congestion with rhinorrhea 09/08/2016   Chronic seasonal allergic rhinitis due to pollen 09/08/2016   Impaired fasting glucose 12/10/2015   Postherpetic neuralgia 11/12/2015   Herpes zoster without complication 11/04/2015   Acute recurrent ethmoidal sinusitis 09/09/2015   Bronchitis 09/09/2015   OSA on CPAP 09/09/2015   Rhinitis due to pollen 09/09/2015   Plantar fascial fibromatosis 11/25/2014   CAP (community acquired pneumonia) 10/15/2014   Allergic rhinitis 10/15/2014   Atelectasis 09/30/2014   Abnormal weight loss 09/06/2014   Cough 09/06/2014   CPAP rhinitis 08/27/2014   Anxiety disorder 03/15/2014   Colorectal polyps 03/15/2014   Glaucoma 03/15/2014   Screening for depression 03/15/2014   Male  hypogonadism 03/15/2014   Other ill-defined heart diseases 03/15/2014   Bladder cancer (HCC) 11/06/2013   Sleep apnea with use of continuous positive airway pressure (CPAP) 08/23/2013   Coronary artery calcification seen on CAT scan 08/17/2013   Cardiomyopathy (HCC) 08/17/2013   Diastolic dysfunction 08/07/2013   Testosterone  deficiency 10/18/2012   CARPAL TUNNEL SYNDROME, LEFT 03/12/2010   Brachial neuritis or radiculitis 03/12/2010   NONSPECIFIC ABNORMAL ELECTROCARDIOGRAM 10/08/2009   ADENOMATOUS COLONIC POLYP 04/06/2008   Diverticulosis of colon 04/06/2008   Diaphragmatic hernia 12/18/2007   HYPERLIPIDEMIA 10/20/2007   Essential hypertension 05/30/2007   G E R D 05/30/2007   Other specified abnormal findings of blood chemistry 12/19/2006     REFERRING PROVIDER: Fairy Lamar Lee  PA-C  REFERRING DIAG: S03.348  Presence of R artificial knee joint    M25.561, G89.29  Chronic pain of R knee  THERAPY DIAG:  Chronic pain of right knee  Rationale  for Evaluation and Treatment: Rehabilitation  ONSET DATE:  PT order 10/15/24  SUBJECTIVE:   SUBJECTIVE STATEMENT: Pt denies any adverse effects after prior treatment.  Pt sent a message to PA about his back pain.  PA instructed pt to add some exercises to his routine including prone planks, supine dead bug, and cat/cow.  Pt hasn't been playing golf and his back and knee are feeling better.  PA for knee surgeon didn't want pt to perform squats.   Eval: Pt saw PA on 11/24 and had x rays.  Pt states he had a slight separation, hairline space from the bone and hardware.  PA note indicated low suspicion for right knee TKA PJI, or hardware instability. Questionable osteolysis of the right femoral component but no prior radiographs to compare to. Discussed conservative and surgical treatment options. He elected for conservative management at this time with continued rest, ice, elevation, compressive wraps, Tylenol , and Voltaren gel. PA  ordered  PT to help with quadricep strength and/or quadricep tendinitis. If symptoms persist, could consider bone scan to rule out hardware loosening.     PERTINENT HISTORY: PA for knee surgeon didn't want pt to perform squats.  Type II DM TLIF L2-3 06/07/24 Chronic kidney disease  Gout TKR 02/2022  Pt has bladder CA Cardiomyopathy/ CAD--Pt denies Arthritis    PAIN:  NPRS:  1/10 current, 6/10 worst, Average daily pain 3.5-4/10 Location:  R knee  3-4/10 in lumbar  PRECAUTIONS: Other: Bladder CA, Hx of lumbar fusion    WEIGHT BEARING RESTRICTIONS: No  FALLS:  Has patient fallen in last 6 months? No  LIVING ENVIRONMENT: Lives with: lives with their spouse Lives in: 1 story home Stairs: has stairs to go into a bonus room.  Has 2 steps and 5 steps depending on the entrance.    OCCUPATION: retired  PLOF: Independent  PATIENT GOALS: reduce pain with activity   OBJECTIVE:  Note: Objective measures were completed at Evaluation unless otherwise noted.  DIAGNOSTIC FINDINGS:   Imaging findings consistent with what appears to be a cemented CR right total knee arthroplasty with mild osteolysis/general lucency of the anterior portion of the femoral component. Otherwise, right TKA appears to be well fixated without severe complication or periprosthetic fracture noted. Left knee radiographs show well-preserved joint space. (Per MD note)   X ray: L knee (Epic) IMPRESSION: 1. No acute fracture. 2. No malalignment. 3. Small joint effusion. 4. Mild tricompartmental degenerative changes. 5. Vascular calcifications.   PATIENT SURVEYS:  LEFS:  53/80  COGNITION: Overall cognitive status: Within functional limits for tasks assessed       LOWER EXTREMITY STRENGTH:  MMT Right eval Left eval  Hip flexion 5/5 5/5  Hip extension    Hip abduction 5/5 5/5  Hip adduction    Hip internal rotation    Hip external rotation    Knee flexion 5/5 seated 5/5 seated  Knee  extension 5/5 with .5-1/10 5/5  Ankle dorsiflexion    Ankle plantarflexion    Ankle inversion    Ankle eversion     (Blank rows = not tested)  LOWER EXTREMITY ROM:  ROM Right eval Left eval  Hip flexion    Hip extension    Hip abduction    Hip adduction    Hip internal rotation    Hip external rotation    Knee flexion 120 128  Knee extension 0 0  Ankle dorsiflexion    Ankle plantarflexion    Ankle inversion    Ankle eversion     (  Blank rows = not tested)   FUNCTIONAL TESTS:  No pain on 4 inch step, tinge of pain on 6 inch step, 2-2.5/10 pain on 8 inch step without UE support  GAIT: Assistive device utilized: None Level of assistance: Complete Independence Comments: Heel to toe gait.  No limping.  limited lumbar rotation.                                                                                                                                 TREATMENT:   Reviewed pt presentation, response to prior treatment, and pain level.   Recumbent bike lvl 1-2 x 5 mins Supine SLR 3x10 LAQ with RTB , GTB 2x15 Supine bridge 3x10  Step ups 6 inch, 8 inch Lateral step ups 6 inch 2x10  Pt received a HEP handout and was educated in correct form and appropriate frequency.  PT instructed pt he should not have back or knee pain with exercises.     PATIENT EDUCATION:  Education details: dx, rationale of interventions, objective findings, relevant anatomy, POC, and what to expect next treatment.  PT answered pt's questions.   Person educated: Patient Education method: Explanation Education comprehension: verbalized understanding  HOME EXERCISE PROGRAM: Access Code: [email or web address] URL: [email or web address] Date: 12/20/2024 Prepared by: Mose Minerva  Exercises - Supine Active Straight Leg Raise  - 1 x daily - 7 x weekly - 2 sets - 10 reps - Supine Bridge  - 1 x daily - 5 x weekly - 2 sets - 10 reps - Seated Knee Extension with Resistance  - 1 x daily - 3 x  weekly - 3 sets - 10 reps  ASSESSMENT:  CLINICAL IMPRESSION: Patient hasn't been playing golf and states his knee and back are feeling better.  PT instructed pt in correct form of exercises and established HEP today.  Pt performed exercises well and required cuing to decrease speed and improve control of movement.  PT had pt perform step activities including fwd and lateral step ups and he performed step activities well.  He performed 8 inch step ups without c/o's.  PT gave pt a HEP handout and educated pt in HEP.  PT instructed pt that he shouldn't have increased knee or back pain with home exercises and to stop exercises if he does have pain.  He responded well to treatment reporting no increased pain and having no c/o's after treatment.        OBJECTIVE IMPAIRMENTS: decreased activity tolerance and pain.   ACTIVITY LIMITATIONS: squatting, stairs, and locomotion level  PARTICIPATION LIMITATIONS: golf  PERSONAL FACTORS: 3+ comorbidities: TLIF L2-3, DM, and bladder CA are also affecting patient's functional outcome.   REHAB POTENTIAL: Good  CLINICAL DECISION MAKING: Stable/uncomplicated  EVALUATION COMPLEXITY: Low   GOALS:   SHORT TERM GOALS: Target date: 01/01/2025  Pt will be independent and compliant with HEP for improved pain, strength, and function.   Baseline: Goal status: INITIAL  2.  Pt will report at least a 25% improvement in pain and sx's overall.  Baseline:  Goal status: INITIAL    LONG TERM GOALS: Target date: 01/22/2025  Pt will perform stairs with a reciprocal gait with good control without increased pain.  Baseline:  Goal status: INITIAL  2.  Pt will report at least a 70% improvement in pain and sx's overall.   Baseline:  Goal status: INITIAL  3.  Pt will report he is able to perform steps at home without difficulty and without significant pain.  Baseline:  Goal status: INITIAL    PLAN:  PT FREQUENCY: 1x/week  PT DURATION: 6 weeks  PLANNED  INTERVENTIONS: [id number]- PT Re-evaluation, 97750- Physical Performance Testing, 97110-Therapeutic exercises, 97530- Therapeutic activity, [id number]- Neuromuscular re-education, 97535- Self Care, [id number]- Manual therapy, [phone]- Gait training, [phone]- Aquatic Therapy, Patient/Family education, Balance training, Stair training, Taping, and Cryotherapy  PLAN FOR NEXT SESSION: Quad strengthening.  Step activities.  Leigh Minerva III PT, DPT 12/21/24 9:35 AM     "

## 2024-12-28 ENCOUNTER — Ambulatory Visit (HOSPITAL_BASED_OUTPATIENT_CLINIC_OR_DEPARTMENT_OTHER): Payer: Self-pay | Attending: Physician Assistant

## 2024-12-28 ENCOUNTER — Encounter (HOSPITAL_BASED_OUTPATIENT_CLINIC_OR_DEPARTMENT_OTHER): Payer: Self-pay

## 2024-12-28 DIAGNOSIS — M5459 Other low back pain: Secondary | ICD-10-CM

## 2024-12-28 DIAGNOSIS — G8929 Other chronic pain: Secondary | ICD-10-CM

## 2024-12-28 NOTE — Therapy (Signed)
 " OUTPATIENT PHYSICAL THERAPY LOWER EXTREMITY TREATMENT   Patient Name: Brian Cortez MRN: [id number] DOB:05-01-1951, 74 y.o., male Today's Date: 12/28/2024  END OF SESSION:  PT End of Session - 12/28/24 1018     Visit Number 3    Number of Visits 6    Date for Recertification  01/22/25    Authorization Type [employer] MCR    Authorization Time Period 12/11/24 - 02/02/25    Authorization - Number of Visits 6    Progress Note Due on Visit 10    PT Start Time 1016    PT Stop Time 1100    PT Time Calculation (min) 44 min    Activity Tolerance Patient tolerated treatment well    Behavior During Therapy WFL for tasks assessed/performed           Past Medical History:  Diagnosis Date   Acute pancreatitis    Anxiety    Bladder tumor    Carpal tunnel syndrome, left    intermittant   Chronic seasonal allergic rhinitis    Coronary artery disease    Coronary calcium  score is very high at 1582   DDD (degenerative disc disease), cervical    Diabetes mellitus type 2 in nonobese (HCC)    Dilated cardiomyopathy (HCC)    Diverticulosis of colon    Fecal incontinence    GERD (gastroesophageal reflux disease)    Grade II diastolic dysfunction 06/17/2017   per ECHO    Hemorrhoids    Hiatal hernia    History of adenomatous polyp of colon    2001;  2007;  2012   History of bladder cancer urologist-  dr chales   11-06-2013  s/p TURBT and BCG tx's   History of kidney stones    about 45 years ago   History of panic attacks    History of urethral stricture    Hyperlipidemia    Hypertension    Hypogonadism in male    LVH (left ventricular hypertrophy) 06/17/2017   Mild, noted on ECHO   Nonischemic cardiomyopathy [facility])    cardiologist-  dr rolan-- per last echo 01/ 2016 ef 50-55%  (up from 09/ 2014 ef 45-50%)   OSA on CPAP    since 2014   Pneumonia 11/2010   Left lower lobe   Pulmonary hypertension (HCC) 06/17/2017   Mild, per ECHO   Past Surgical History:  Procedure  Laterality Date   CARDIOVASCULAR STRESS TEST  08-21-2013  dr rolan   normal nuclear study w/ no ischemia/  normal LV function and wall motion , ef 63%   CATARACT EXTRACTION W/ INTRAOCULAR LENS  IMPLANT, BILATERAL  2013   COLONOSCOPY  last one 03-03-2016   CYSTO/  DIRECT VISION INTERNAL URETEROTOMY  11/25/2006   LAPAROSCOPIC CHOLECYSTECTOMY  10/15/2006   MENISCUS REPAIR Right 12/2020   ORBITAL FRACTURE SURGERY  1986   left eye:  Jan 1986--- fixation of fractures, repair of optic nerve decompression , and complex closure   TONSILLECTOMY  child   TRANSTHORACIC ECHOCARDIOGRAM  12-16-2014   dr rolan   grade 1 diastolic dysfunction, ef 50-55% (improved from last study in 2014 , previous ef 45-50%) /  trivial MR and TR/  mild LAE/  inferior vena cava dilated, respirophasic diameter changes were blunted (<50%), consistant w/ elevated central venous pressure   TRANSURETHRAL RESECTION OF BLADDER TUMOR Bilateral 05/16/2018   Procedure: TRANSURETHRAL RESECTION OF BLADDER TUMOR (TURBT)/ RETROGRADE;  Surgeon: Nieves Cough, MD;  Location: [facility];  Service:  Urology;  Laterality: Bilateral;   TRANSURETHRAL RESECTION OF BLADDER TUMOR WITH GYRUS (TURBT-GYRUS) N/A 11/06/2013   Procedure: TRANSURETHRAL RESECTION OF BLADDER TUMOR WITH GYRUS (TURBT-GYRUS);  Surgeon: Arlena LILLETTE Gal, MD;  Location: [facility];  Service: Urology;  Laterality: N/A;   TRANSURETHRAL RESECTION OF BLADDER TUMOR WITH MITOMYCIN -C N/A 12/09/2016   Procedure: CYSTOSCOPY TRANSURETHRAL RESECTION OF BLADDER TUMOR WITH MITOMYCIN -C;  Surgeon: Arlena Gal, MD;  Location: [facility];  Service: Urology;  Laterality: N/A;   VARICOSE VEIN SURGERY  2002   VASECTOMY     Patient Active Problem List   Diagnosis Date Noted   MCI (mild cognitive impairment) 05/16/2023   Primary open-angle glaucoma (POAG), moderate stage 05/13/2022   History of vitrectomy 04/08/2022   Amnestic MCI (mild cognitive  impairment with memory loss) 08/31/2021   Chronic kidney disease, stage 3b (HCC) 05/15/2021   COVID-19 05/15/2021   Mild cognitive disorder 05/15/2021   Bilateral impacted cerumen 02/13/2021   Deviated septum 02/13/2021   Epistaxis 02/13/2021   Myopic macular degeneration 04/02/2020   Posterior vitreous detachment of right eye 04/02/2020   Early stage nonexudative age-related macular degeneration of both eyes 04/02/2020   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 04/02/2020   Low back pain 03/04/2020   Other specified local infections of the skin and subcutaneous tissue 08/20/2019   Secondary polycythemia 08/06/2019   Atherosclerosis of coronary artery 02/07/2019   Tinea cruris 10/16/2018   Neoplastic malignant related fatigue 09/20/2018   Convalescence after chemotherapy 09/20/2018   Anterior urethral stricture 08/04/2018   Bladder trabeculation 08/04/2018   Diverticula, bladder acquired 08/04/2018   Erectile dysfunction due to diseases classified elsewhere 08/04/2018   Low testosterone  in male 08/04/2018   Primary open angle glaucoma (POAG) of both eyes, moderate stage 02/10/2018   Obesity with body mass index greater than 30 09/14/2017   Incontinence of feces 04/22/2017   Pain in right knee 01/25/2017   Nasal congestion with rhinorrhea 09/08/2016   Chronic seasonal allergic rhinitis due to pollen 09/08/2016   Impaired fasting glucose 12/10/2015   Postherpetic neuralgia 11/12/2015   Herpes zoster without complication 11/04/2015   Acute recurrent ethmoidal sinusitis 09/09/2015   Bronchitis 09/09/2015   OSA on CPAP 09/09/2015   Rhinitis due to pollen 09/09/2015   Plantar fascial fibromatosis 11/25/2014   CAP (community acquired pneumonia) 10/15/2014   Allergic rhinitis 10/15/2014   Atelectasis 09/30/2014   Abnormal weight loss 09/06/2014   Cough 09/06/2014   CPAP rhinitis 08/27/2014   Anxiety disorder 03/15/2014   Colorectal polyps 03/15/2014   Glaucoma 03/15/2014    Screening for depression 03/15/2014   Male hypogonadism 03/15/2014   Other ill-defined heart diseases 03/15/2014   Bladder cancer (HCC) 11/06/2013   Sleep apnea with use of continuous positive airway pressure (CPAP) 08/23/2013   Coronary artery calcification seen on CAT scan 08/17/2013   Cardiomyopathy (HCC) 08/17/2013   Diastolic dysfunction 08/07/2013   Testosterone  deficiency 10/18/2012   CARPAL TUNNEL SYNDROME, LEFT 03/12/2010   Brachial neuritis or radiculitis 03/12/2010   NONSPECIFIC ABNORMAL ELECTROCARDIOGRAM 10/08/2009   ADENOMATOUS COLONIC POLYP 04/06/2008   Diverticulosis of colon 04/06/2008   Diaphragmatic hernia 12/18/2007   HYPERLIPIDEMIA 10/20/2007   Essential hypertension 05/30/2007   G E R D 05/30/2007   Other specified abnormal findings of blood chemistry 12/19/2006     REFERRING PROVIDER: Fairy Lamar Lee  PA-C  REFERRING DIAG: S03.348  Presence of R artificial knee joint    M25.561, G89.29  Chronic pain of R knee  THERAPY DIAG:  Chronic pain of right knee  Other low back pain  Rationale for Evaluation and Treatment: Rehabilitation  ONSET DATE:  PT order 10/15/24  SUBJECTIVE:   SUBJECTIVE STATEMENT: Pt reports he has been feeling better with the exercises from PT and the PA. Does personal training 2x/week and the trainer works on his PT/PA exercises with him too.    Eval: Pt saw PA on 11/24 and had x rays.  Pt states he had a slight separation, hairline space from the bone and hardware.  PA note indicated low suspicion for right knee TKA PJI, or hardware instability. Questionable osteolysis of the right femoral component but no prior radiographs to compare to. Discussed conservative and surgical treatment options. He elected for conservative management at this time with continued rest, ice, elevation, compressive wraps, Tylenol , and Voltaren gel. PA ordered  PT to help with quadricep strength and/or quadricep tendinitis. If symptoms persist, could  consider bone scan to rule out hardware loosening.     PERTINENT HISTORY: PA for knee surgeon didn't want pt to perform squats.  Type II DM TLIF L2-3 06/07/24 Chronic kidney disease  Gout TKR 02/2022  Pt has bladder CA Cardiomyopathy/ CAD--Pt denies Arthritis    PAIN:  NPRS:  1/10 current, 6/10 worst, Average daily pain 3.5-4/10 Location:  R knee  3-4/10 in lumbar  PRECAUTIONS: Other: Bladder CA, Hx of lumbar fusion    WEIGHT BEARING RESTRICTIONS: No  FALLS:  Has patient fallen in last 6 months? No  LIVING ENVIRONMENT: Lives with: lives with their spouse Lives in: 1 story home Stairs: has stairs to go into a bonus room.  Has 2 steps and 5 steps depending on the entrance.    OCCUPATION: retired  PLOF: Independent  PATIENT GOALS: reduce pain with activity   OBJECTIVE:  Note: Objective measures were completed at Evaluation unless otherwise noted.  DIAGNOSTIC FINDINGS:   Imaging findings consistent with what appears to be a cemented CR right total knee arthroplasty with mild osteolysis/general lucency of the anterior portion of the femoral component. Otherwise, right TKA appears to be well fixated without severe complication or periprosthetic fracture noted. Left knee radiographs show well-preserved joint space. (Per MD note)   X ray: L knee (Epic) IMPRESSION: 1. No acute fracture. 2. No malalignment. 3. Small joint effusion. 4. Mild tricompartmental degenerative changes. 5. Vascular calcifications.   PATIENT SURVEYS:  LEFS:  53/80  COGNITION: Overall cognitive status: Within functional limits for tasks assessed       LOWER EXTREMITY STRENGTH:  MMT Right eval Left eval  Hip flexion 5/5 5/5  Hip extension    Hip abduction 5/5 5/5  Hip adduction    Hip internal rotation    Hip external rotation    Knee flexion 5/5 seated 5/5 seated  Knee extension 5/5 with .5-1/10 5/5  Ankle dorsiflexion    Ankle plantarflexion    Ankle inversion    Ankle  eversion     (Blank rows = not tested)  LOWER EXTREMITY ROM:  ROM Right eval Left eval  Hip flexion    Hip extension    Hip abduction    Hip adduction    Hip internal rotation    Hip external rotation    Knee flexion 120 128  Knee extension 0 0  Ankle dorsiflexion    Ankle plantarflexion    Ankle inversion    Ankle eversion     (Blank rows = not tested)   FUNCTIONAL TESTS:  No pain on  4 inch step, tinge of pain on 6 inch step, 2-2.5/10 pain on 8 inch step without UE support  GAIT: Assistive device utilized: None Level of assistance: Complete Independence Comments: Heel to toe gait.  No limping.  limited lumbar rotation.                                                                                                                                 TREATMENT:    12/28/24 Sci fit bike L 3.5 x77min Bridges 3x10 Supine SLR 3x10 S/l hip abduction 3x10 Leg extension machine 25# 3x10 Single HR 3x10 bil Squat taps to plinth x10 Eccentric step down 2x10 4 step x10 bottom stair R knee PROM Patella mobilizations Passive HSS supine      Previous: Reviewed pt presentation, response to prior treatment, and pain level.   Recumbent bike lvl 1-2 x 5 mins Supine SLR 3x10 LAQ with RTB , GTB 2x15 Supine bridge 3x10  Step ups 6 inch, 8 inch Lateral step ups 6 inch 2x10  Pt received a HEP handout and was educated in correct form and appropriate frequency.  PT instructed pt he should not have back or knee pain with exercises.     PATIENT EDUCATION:  Education details: dx, rationale of interventions, objective findings, relevant anatomy, POC, and what to expect next treatment.  PT answered pt's questions.   Person educated: Patient Education method: Explanation Education comprehension: verbalized understanding  HOME EXERCISE PROGRAM: Access Code: [email or web address] URL: [email or web address] Date: 12/20/2024 Prepared by: Mose Minerva  Exercises - Supine  Active Straight Leg Raise  - 1 x daily - 7 x weekly - 2 sets - 10 reps - Supine Bridge  - 1 x daily - 5 x weekly - 2 sets - 10 reps - Seated Knee Extension with Resistance  - 1 x daily - 3 x weekly - 3 sets - 10 reps  ASSESSMENT:  CLINICAL IMPRESSION:   Pt with excellent tolerance for strengthening progressions. Trialed leg extension machine with good tolerance and no increase in pain. Worked on single leg calf strengthening which was mildly challenging. No complaints with eccentric step downs from 4 and 6 height. Updated HEP as pt does does have PT appts scheduled and will be going out of town. Pt sees surgeon next month to discuss prosthesis.      OBJECTIVE IMPAIRMENTS: decreased activity tolerance and pain.   ACTIVITY LIMITATIONS: squatting, stairs, and locomotion level  PARTICIPATION LIMITATIONS: golf  PERSONAL FACTORS: 3+ comorbidities: TLIF L2-3, DM, and bladder CA are also affecting patient's functional outcome.   REHAB POTENTIAL: Good  CLINICAL DECISION MAKING: Stable/uncomplicated  EVALUATION COMPLEXITY: Low   GOALS:   SHORT TERM GOALS: Target date: 01/01/2025  Pt will be independent and compliant with HEP for improved pain, strength, and function.   Baseline: Goal status: INITIAL  2.  Pt will report at least a 25% improvement in pain and sx's overall.  Baseline:  Goal status:  INITIAL    LONG TERM GOALS: Target date: 01/22/2025  Pt will perform stairs with a reciprocal gait with good control without increased pain.  Baseline:  Goal status: INITIAL  2.  Pt will report at least a 70% improvement in pain and sx's overall.   Baseline:  Goal status: INITIAL  3.  Pt will report he is able to perform steps at home without difficulty and without significant pain.  Baseline:  Goal status: INITIAL    PLAN:  PT FREQUENCY: 1x/week  PT DURATION: 6 weeks  PLANNED INTERVENTIONS: [id number]- PT Re-evaluation, 97750- Physical Performance Testing, 97110-Therapeutic  exercises, 97530- Therapeutic activity, [id number]- Neuromuscular re-education, 97535- Self Care, [id number]- Manual therapy, [phone]- Gait training, [phone]- Aquatic Therapy, Patient/Family education, Balance training, Stair training, Taping, and Cryotherapy  PLAN FOR NEXT SESSION: Quad strengthening.  Step activities.  Asberry Rodes, PTA  12/28/24 1:12 PM     "

## 2025-02-12 ENCOUNTER — Ambulatory Visit: Payer: Self-pay | Admitting: Cardiology
